# Patient Record
Sex: Female | Born: 1965 | Race: Black or African American | Hispanic: No | Smoking: Never smoker
Health system: Southern US, Community
[De-identification: ages and names within clinical notes are randomized; demographics above are authoritative.]

## PROBLEM LIST (undated history)

## (undated) DIAGNOSIS — G5710 Meralgia paresthetica, unspecified lower limb: Secondary | ICD-10-CM

## (undated) DIAGNOSIS — J189 Pneumonia, unspecified organism: Secondary | ICD-10-CM

## (undated) DIAGNOSIS — D509 Iron deficiency anemia, unspecified: Secondary | ICD-10-CM

## (undated) DIAGNOSIS — C50919 Malignant neoplasm of unspecified site of unspecified female breast: Secondary | ICD-10-CM

## (undated) DIAGNOSIS — Z923 Personal history of irradiation: Secondary | ICD-10-CM

## (undated) DIAGNOSIS — R519 Headache, unspecified: Secondary | ICD-10-CM

## (undated) DIAGNOSIS — K219 Gastro-esophageal reflux disease without esophagitis: Secondary | ICD-10-CM

## (undated) DIAGNOSIS — J309 Allergic rhinitis, unspecified: Secondary | ICD-10-CM

## (undated) DIAGNOSIS — M1990 Unspecified osteoarthritis, unspecified site: Secondary | ICD-10-CM

## (undated) DIAGNOSIS — K449 Diaphragmatic hernia without obstruction or gangrene: Secondary | ICD-10-CM

## (undated) DIAGNOSIS — K222 Esophageal obstruction: Secondary | ICD-10-CM

## (undated) DIAGNOSIS — R51 Headache: Secondary | ICD-10-CM

## (undated) DIAGNOSIS — E890 Postprocedural hypothyroidism: Secondary | ICD-10-CM

## (undated) DIAGNOSIS — R1310 Dysphagia, unspecified: Secondary | ICD-10-CM

## (undated) DIAGNOSIS — E559 Vitamin D deficiency, unspecified: Secondary | ICD-10-CM

## (undated) DIAGNOSIS — T4145XA Adverse effect of unspecified anesthetic, initial encounter: Secondary | ICD-10-CM

## (undated) DIAGNOSIS — F411 Generalized anxiety disorder: Secondary | ICD-10-CM

## (undated) DIAGNOSIS — E669 Obesity, unspecified: Secondary | ICD-10-CM

## (undated) DIAGNOSIS — M778 Other enthesopathies, not elsewhere classified: Secondary | ICD-10-CM

## (undated) DIAGNOSIS — T8859XA Other complications of anesthesia, initial encounter: Secondary | ICD-10-CM

## (undated) DIAGNOSIS — I10 Essential (primary) hypertension: Secondary | ICD-10-CM

## (undated) DIAGNOSIS — E236 Other disorders of pituitary gland: Secondary | ICD-10-CM

## (undated) DIAGNOSIS — E0590 Thyrotoxicosis, unspecified without thyrotoxic crisis or storm: Secondary | ICD-10-CM

## (undated) DIAGNOSIS — Z9221 Personal history of antineoplastic chemotherapy: Secondary | ICD-10-CM

## (undated) DIAGNOSIS — E0500 Thyrotoxicosis with diffuse goiter without thyrotoxic crisis or storm: Secondary | ICD-10-CM

## (undated) DIAGNOSIS — R011 Cardiac murmur, unspecified: Secondary | ICD-10-CM

## (undated) DIAGNOSIS — Z803 Family history of malignant neoplasm of breast: Secondary | ICD-10-CM

## (undated) DIAGNOSIS — G4733 Obstructive sleep apnea (adult) (pediatric): Secondary | ICD-10-CM

## (undated) DIAGNOSIS — F419 Anxiety disorder, unspecified: Secondary | ICD-10-CM

## (undated) DIAGNOSIS — E039 Hypothyroidism, unspecified: Secondary | ICD-10-CM

## (undated) DIAGNOSIS — D649 Anemia, unspecified: Secondary | ICD-10-CM

## (undated) HISTORY — PX: DILATION AND CURETTAGE OF UTERUS: SHX78

## (undated) HISTORY — PX: COLONOSCOPY: SHX174

## (undated) HISTORY — DX: Essential (primary) hypertension: I10

## (undated) HISTORY — DX: Allergic rhinitis, unspecified: J30.9

## (undated) HISTORY — DX: Meralgia paresthetica, unspecified lower limb: G57.10

## (undated) HISTORY — DX: Obesity, unspecified: E66.9

## (undated) HISTORY — DX: Obstructive sleep apnea (adult) (pediatric): G47.33

## (undated) HISTORY — DX: Diaphragmatic hernia without obstruction or gangrene: K44.9

## (undated) HISTORY — PX: ESOPHAGOGASTRODUODENOSCOPY: SHX1529

## (undated) HISTORY — DX: Anxiety disorder, unspecified: F41.9

## (undated) HISTORY — DX: Vitamin D deficiency, unspecified: E55.9

## (undated) HISTORY — DX: Other disorders of pituitary gland: E23.6

## (undated) HISTORY — DX: Generalized anxiety disorder: F41.1

## (undated) HISTORY — DX: Hypothyroidism, unspecified: E03.9

## (undated) HISTORY — PX: KNEE ARTHROSCOPY: SUR90

## (undated) HISTORY — PX: WISDOM TOOTH EXTRACTION: SHX21

## (undated) HISTORY — DX: Gastro-esophageal reflux disease without esophagitis: K21.9

## (undated) HISTORY — DX: Esophageal obstruction: K22.2

## (undated) HISTORY — DX: Family history of malignant neoplasm of breast: Z80.3

## (undated) HISTORY — DX: Dysphagia, unspecified: R13.10

## (undated) HISTORY — DX: Iron deficiency anemia, unspecified: D50.9

## (undated) HISTORY — DX: Postprocedural hypothyroidism: E89.0

## (undated) HISTORY — DX: Thyrotoxicosis with diffuse goiter without thyrotoxic crisis or storm: E05.00

## (undated) HISTORY — DX: Cardiac murmur, unspecified: R01.1

---
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
## 1999-06-07 ENCOUNTER — Encounter: Payer: Self-pay | Admitting: Internal Medicine

## 1999-06-07 ENCOUNTER — Ambulatory Visit (HOSPITAL_COMMUNITY): Admission: RE | Admit: 1999-06-07 | Discharge: 1999-06-07 | Payer: Self-pay | Admitting: Internal Medicine

## 2000-11-30 ENCOUNTER — Other Ambulatory Visit: Admission: RE | Admit: 2000-11-30 | Discharge: 2000-11-30 | Payer: Self-pay | Admitting: *Deleted

## 2001-01-26 ENCOUNTER — Encounter: Admission: RE | Admit: 2001-01-26 | Discharge: 2001-01-26 | Payer: Self-pay | Admitting: Obstetrics and Gynecology

## 2001-01-26 ENCOUNTER — Encounter: Payer: Self-pay | Admitting: Obstetrics and Gynecology

## 2003-08-11 ENCOUNTER — Ambulatory Visit (HOSPITAL_COMMUNITY): Admission: RE | Admit: 2003-08-11 | Discharge: 2003-08-11 | Payer: Self-pay | Admitting: Family Medicine

## 2003-08-19 ENCOUNTER — Ambulatory Visit (HOSPITAL_BASED_OUTPATIENT_CLINIC_OR_DEPARTMENT_OTHER): Admission: RE | Admit: 2003-08-19 | Discharge: 2003-08-19 | Payer: Self-pay | Admitting: Family Medicine

## 2004-05-20 ENCOUNTER — Ambulatory Visit: Payer: Self-pay | Admitting: Internal Medicine

## 2004-09-24 ENCOUNTER — Ambulatory Visit: Payer: Self-pay | Admitting: Internal Medicine

## 2004-10-09 ENCOUNTER — Ambulatory Visit: Payer: Self-pay | Admitting: Family Medicine

## 2005-06-28 ENCOUNTER — Ambulatory Visit: Payer: Self-pay | Admitting: Internal Medicine

## 2006-12-29 ENCOUNTER — Other Ambulatory Visit: Admission: RE | Admit: 2006-12-29 | Discharge: 2006-12-29 | Payer: Self-pay | Admitting: Obstetrics and Gynecology

## 2008-04-02 ENCOUNTER — Encounter: Payer: Self-pay | Admitting: Family Medicine

## 2009-07-31 ENCOUNTER — Encounter: Admission: RE | Admit: 2009-07-31 | Discharge: 2009-07-31 | Payer: Self-pay | Admitting: Family Medicine

## 2010-03-21 ENCOUNTER — Encounter: Payer: Self-pay | Admitting: Family Medicine

## 2010-08-30 ENCOUNTER — Other Ambulatory Visit (HOSPITAL_COMMUNITY): Payer: Self-pay | Admitting: Family Medicine

## 2010-08-30 DIAGNOSIS — E0590 Thyrotoxicosis, unspecified without thyrotoxic crisis or storm: Secondary | ICD-10-CM

## 2010-09-15 ENCOUNTER — Encounter (HOSPITAL_COMMUNITY)
Admission: RE | Admit: 2010-09-15 | Discharge: 2010-09-15 | Disposition: A | Discharge status: Home / Self Care | Payer: BC Managed Care – PPO | Source: Ambulatory Visit | Attending: Family Medicine | Admitting: Family Medicine

## 2010-09-15 DIAGNOSIS — E042 Nontoxic multinodular goiter: Secondary | ICD-10-CM | POA: Insufficient documentation

## 2010-09-15 DIAGNOSIS — E0590 Thyrotoxicosis, unspecified without thyrotoxic crisis or storm: Secondary | ICD-10-CM | POA: Insufficient documentation

## 2010-09-16 ENCOUNTER — Encounter (HOSPITAL_COMMUNITY)
Admission: RE | Admit: 2010-09-16 | Discharge: 2010-09-16 | Disposition: A | Discharge status: Home / Self Care | Payer: BC Managed Care – PPO | Source: Ambulatory Visit | Attending: Family Medicine | Admitting: Family Medicine

## 2010-09-16 ENCOUNTER — Encounter (HOSPITAL_COMMUNITY): Payer: Self-pay

## 2010-09-16 HISTORY — DX: Thyrotoxicosis, unspecified without thyrotoxic crisis or storm: E05.90

## 2010-09-16 MED ORDER — SODIUM IODIDE I 131 CAPSULE
9.7000 | Freq: Once | INTRAVENOUS | Status: AC | PRN
  Administered 2010-09-16: 9.7 via ORAL

## 2010-09-16 MED ORDER — SODIUM PERTECHNETATE TC 99M INJECTION
10.3000 | Freq: Once | INTRAVENOUS | Status: AC | PRN
  Administered 2010-09-16: 10.3 via INTRAVENOUS

## 2011-01-06 ENCOUNTER — Other Ambulatory Visit (HOSPITAL_COMMUNITY): Payer: Self-pay | Admitting: Endocrinology

## 2011-01-06 DIAGNOSIS — E0590 Thyrotoxicosis, unspecified without thyrotoxic crisis or storm: Secondary | ICD-10-CM

## 2011-01-13 ENCOUNTER — Encounter (HOSPITAL_COMMUNITY)
Admission: RE | Admit: 2011-01-13 | Discharge: 2011-01-13 | Disposition: A | Discharge status: Home / Self Care | Payer: BC Managed Care – PPO | Source: Ambulatory Visit | Attending: Endocrinology | Admitting: Endocrinology

## 2011-01-13 DIAGNOSIS — E0500 Thyrotoxicosis with diffuse goiter without thyrotoxic crisis or storm: Secondary | ICD-10-CM

## 2011-01-13 DIAGNOSIS — E0590 Thyrotoxicosis, unspecified without thyrotoxic crisis or storm: Secondary | ICD-10-CM | POA: Insufficient documentation

## 2011-01-13 HISTORY — DX: Thyrotoxicosis with diffuse goiter without thyrotoxic crisis or storm: E05.00

## 2011-01-13 MED ORDER — SODIUM IODIDE I 131 CAPSULE: 14.6000 | Freq: Once | INTRAVENOUS | Status: AC | PRN

## 2012-10-08 ENCOUNTER — Other Ambulatory Visit: Payer: Self-pay

## 2012-10-08 DIAGNOSIS — Z1231 Encounter for screening mammogram for malignant neoplasm of breast: Secondary | ICD-10-CM

## 2012-11-05 ENCOUNTER — Ambulatory Visit
Admission: RE | Admit: 2012-11-05 | Discharge: 2012-11-05 | Disposition: A | Discharge status: Home / Self Care | Payer: BC Managed Care – PPO | Source: Ambulatory Visit

## 2012-11-05 DIAGNOSIS — Z1231 Encounter for screening mammogram for malignant neoplasm of breast: Secondary | ICD-10-CM

## 2014-01-16 ENCOUNTER — Ambulatory Visit (INDEPENDENT_AMBULATORY_CARE_PROVIDER_SITE_OTHER): Payer: BC Managed Care – PPO | Admitting: Cardiology

## 2014-01-16 ENCOUNTER — Other Ambulatory Visit: Payer: Self-pay | Admitting: *Deleted

## 2014-01-16 ENCOUNTER — Encounter: Payer: Self-pay | Admitting: *Deleted

## 2014-01-16 VITALS — BP 132/88 | HR 66 | Ht 65.0 in | Wt 296.0 lb

## 2014-01-16 DIAGNOSIS — Z8241 Family history of sudden cardiac death: Secondary | ICD-10-CM

## 2014-01-16 DIAGNOSIS — R011 Cardiac murmur, unspecified: Secondary | ICD-10-CM

## 2014-01-16 DIAGNOSIS — I10 Essential (primary) hypertension: Secondary | ICD-10-CM

## 2014-01-16 NOTE — Patient Instructions (Signed)
Your physician recommends that you continue on your current medications as directed. Please refer to the Current Medication list given to you today.   Your physician has requested that you have an echocardiogram. Echocardiography is a painless test that uses sound waves to create images of your heart. It provides your doctor with information about the size and shape of your heart and how well your heart's chambers and valves are working. This procedure takes approximately one hour. There are no restrictions for this procedure.    Your physician has requested that you have an exercise tolerance test. For further information please visit [email or web address]. Please also follow instruction sheet, as given.   Your physician recommends that you schedule a follow-up appointment in: [place]

## 2014-01-16 NOTE — Progress Notes (Signed)
Patient ID: Lizzet Hendley, female   DOB: 03/11/1965, 48 y.o.   MRN: [id number]     Patient Name: Loretta Nelson Date of Encounter: 01/16/2014  Primary Care Provider:  No primary care provider on file. Primary Cardiologist: Dorothy Spark   Problem List   Past Medical History  Diagnosis Date  . Hyperthyroidism   . Graves disease 01/13/11    radioactive iodine treatment, 78.2 millicuries  . Hypothyroidism     s/p treatment  . Hypertension   . Obesity   . Migraine headache   . Allergic rhinitis   . Murmur     benign, h/o, caused by hyperdynamic contraction  . Esophageal stricture   . Hiatal hernia     small  . Anxiety   . Vitamin D deficiency    No past surgical history on file.  Allergies  No Known Allergies  HPI  A very pleasant 48 year old female with history of obesity, difficult to control hypertension who is being referred to [place] for management of hypertension. The patient has history of hyperthyroidism treated with radioactive iodine that was followed by 50 pound weight gain in the last 2 years. The patient is under significant amount of stress, her brother aged 44 passed away 5 months ago of sudden cardiac death, he had no prior medical history he passed away after running and was found that in his car. Autopsy was never performed. The patient is a caregiver of 48 year old twins and her sick mom. She is a fifth grade teacher. She denies any chest pain or shortness of breath other than on significant exertion. She hasn't been exercising lately as she is extremely busy. No history of palpitations or syncope. No orthopnea or paroxysmal nocturnal dyspnea.   Home Medications  Prior to Admission medications   Medication Sig Start Date End Date Taking? Authorizing Provider  Cholecalciferol 4000 UNITS CAPS Take 1 capsule by mouth daily.   Yes Historical Provider, MD  fluticasone (FLONASE) 50 MCG/ACT nasal spray Place into both nostrils daily.   Yes Historical  Provider, MD  loratadine (CLARITIN) 10 MG tablet Take 10 mg by mouth daily.   Yes Historical Provider, MD  amLODipine (NORVASC) 5 MG tablet Take 1 tablet by mouth daily. 12/15/13   Historical Provider, MD  citalopram (CELEXA) 20 MG tablet Take 1 tablet by mouth daily. 12/15/13   Historical Provider, MD  FERREX 150 150 MG capsule Take 1 tablet by mouth daily. 01/06/14   Historical Provider, MD  KLOR-CON M20 20 MEQ tablet Take 20 mEq by mouth daily. 01/06/14   Historical Provider, MD  metoprolol succinate (TOPROL-XL) 100 MG 24 hr tablet Take 100 mg by mouth daily. 01/08/14   Historical Provider, MD  pantoprazole (PROTONIX) 40 MG tablet Take 1 tablet by mouth daily. 12/15/13   Historical Provider, MD  SYNTHROID 150 MCG tablet Take 1 tablet by mouth daily. 12/25/13   Historical Provider, MD  valsartan-hydrochlorothiazide (DIOVAN-HCT) 320-25 MG per tablet Take 1 tablet by mouth daily. 01/06/14   Historical Provider, MD    Family History  Family History  Problem Relation Age of Onset  . Hypertension Mother   . Breast cancer Paternal Grandmother   . CAD Maternal Grandfather   . HIV Brother   . Heart attack Brother     sudden MI vs PE  . Breast cancer Sister   . Colon polyps Neg Hx   . Colon cancer Neg Hx   . Liver disease Neg Hx     Social History  History   Social History  . Marital Status: Divorced    Spouse Name: N/A    Number of Children: N/A  . Years of Education: N/A   Occupational History  . Not on file.   Social History Main Topics  . Smoking status: Never Smoker   . Smokeless tobacco: Not on file  . Alcohol Use: No  . Drug Use: No  . Sexual Activity: Not on file   Other Topics Concern  . Not on file   Social History Narrative     Review of Systems, as per HPI, otherwise negative General:  No chills, fever, night sweats or weight changes.  Cardiovascular:  No chest pain, dyspnea on exertion, edema, orthopnea, palpitations, paroxysmal nocturnal  dyspnea. Dermatological: No rash, lesions/masses Respiratory: No cough, dyspnea Urologic: No hematuria, dysuria Abdominal:   No nausea, vomiting, diarrhea, bright red blood per rectum, melena, or hematemesis Neurologic:  No visual changes, wkns, changes in mental status. All other systems reviewed and are otherwise negative except as noted above.  Physical Exam  Blood pressure 132/88, pulse 66, height 5\' 5"  (1.651 m), weight 296 lb (134.265 kg).  General: Pleasant, NAD Psych: Normal affect. Neuro: Alert and oriented X 3. Moves all extremities spontaneously. HEENT: Normal  Neck: Supple without bruits or JVD. Lungs:  Resp regular and unlabored, CTA. Heart: RRR no s3, s4, 2/6 holosystolic murmur.. Abdomen: Soft, non-tender, non-distended, BS + x 4.  Extremities: No clubbing, cyanosis or edema. DP/PT/Radials 2+ and equal bilaterally.  Labs:  No results for input(s): CKTOTAL, CKMB, TROPONINI in the last 72 hours. No results found for: WBC, HGB, HCT, MCV, PLT  No results found for: DDIMER Invalid input(s): POCBNP No results found for: NA, K, CL, CO2, GLUCOSE, BUN, CREATININE, CALCIUM, PROT, ALBUMIN, AST, ALT, ALKPHOS, BILITOT, GFRNONAA, GFRAA No results found for: CHOL, HDL, LDLCALC, TRIG  Accessory Clinical Findings  Echocardiogram - none  ECG - SR, normal ECG    Assessment & Plan  A very pleasant 48 year old female  1. Hypertension - currently controlled at rest on amlodipine 5 mg daily, Diovan HCTZ 320/25 mg daily, and recently increased Toprol-XL to 100 mg daily. We will perform echocardiogram to evaluate for systolic and diastolic dysfunction and degree of LVH. We will also order an exercise treadmill stress test to evaluate for blood pressure response at stress.  2. Systolic murmur, family history of cardiac death - we will order an echocardiogram to evaluate for possible hypertrophic cardiomyopathy  3. Lipids - unknown however she states they were checked by her primary  care physician and were normal.  4. Obesity - if tests normal encouraged exercise.  Follow-up in one month.    Dorothy Spark, MD, [facility] 01/16/2014, 2:59 PM

## 2014-02-18 ENCOUNTER — Ambulatory Visit (HOSPITAL_COMMUNITY): Payer: BC Managed Care – PPO | Attending: Cardiology | Admitting: Radiology

## 2014-02-18 ENCOUNTER — Encounter: Payer: Self-pay | Admitting: Nurse Practitioner

## 2014-02-18 ENCOUNTER — Ambulatory Visit (INDEPENDENT_AMBULATORY_CARE_PROVIDER_SITE_OTHER): Payer: BC Managed Care – PPO | Admitting: Nurse Practitioner

## 2014-02-18 DIAGNOSIS — I10 Essential (primary) hypertension: Secondary | ICD-10-CM

## 2014-02-18 DIAGNOSIS — E669 Obesity, unspecified: Secondary | ICD-10-CM | POA: Diagnosis not present

## 2014-02-18 NOTE — Progress Notes (Signed)
Echocardiogram performed.  

## 2014-02-18 NOTE — Progress Notes (Signed)
Exercise Treadmill Test  Pre-Exercise Testing Evaluation Rhythm: normal sinus  Rate: 64 bpm     Test  Exercise Tolerance Test Ordering MD: Ena Dawley, MD  Interpreting MD: Truitt Merle, NP  Unique Test No: 1  Treadmill:  1  Indication for ETT: assess BP response  Contraindication to ETT: No   Stress Modality: exercise - treadmill  Cardiac Imaging Performed: non   Protocol: standard Bruce - maximal  Max BP:  209/74  Max MPHR (bpm):  172 85% MPR (bpm):  146  MPHR obtained (bpm):  148 % MPHR obtained:  86%  Reached 85% MPHR (min:sec):  6:15 Total Exercise Time (min-sec):  6:30  Workload in METS:  7.7 Borg Scale: 17  Reason ETT Terminated:  patient's desire to stop    ST Segment Analysis At Rest: normal ST segments - no evidence of significant ST depression With Exercise: no evidence of significant ST depression  Other Information Arrhythmia:  No Angina during ETT:  absent (0) Quality of ETT:  diagnostic  ETT Interpretation:  normal - no evidence of ischemia by ST analysis  Comments: Patient presents today for routine GXT. Has had HTN that has been difficult to manage. For echo after today's study. She held ALL of her BP medicines for today's study.  Today the patient exercised on the standard Bruce protocol for a total of 6:30 minutes.  Reduced exercise tolerance.  Adequate blood pressure response. She took NO BP medicines today.  Clinically negative for chest pain. Test was stopped due to achievement of target HR and fatigue.  EKG negative for ischemia. Resting EKG with poor R wave progression. No significant arrhythmia noted.   Recommendations: CV risk factor modification  Proceed with echo  See Dr. Meda Coffee back as planned.  Patient is agreeable to this plan and will call if any problems develop in the interim.   Burtis Junes, RN, [facility] [address] [place], [place]  [address] [phone]

## 2014-03-14 ENCOUNTER — Ambulatory Visit: Payer: BC Managed Care – PPO | Admitting: Cardiology

## 2014-11-10 ENCOUNTER — Other Ambulatory Visit: Payer: Self-pay | Admitting: Family Medicine

## 2014-11-10 ENCOUNTER — Ambulatory Visit
Admission: RE | Admit: 2014-11-10 | Discharge: 2014-11-10 | Disposition: A | Discharge status: Home / Self Care | Payer: BC Managed Care – PPO | Source: Ambulatory Visit | Attending: Family Medicine | Admitting: Family Medicine

## 2014-11-10 DIAGNOSIS — R58 Hemorrhage, not elsewhere classified: Secondary | ICD-10-CM

## 2014-12-08 ENCOUNTER — Other Ambulatory Visit: Payer: Self-pay | Admitting: Orthopedic Surgery

## 2014-12-08 DIAGNOSIS — M25561 Pain in right knee: Secondary | ICD-10-CM

## 2014-12-11 ENCOUNTER — Ambulatory Visit
Admission: RE | Admit: 2014-12-11 | Discharge: 2014-12-11 | Disposition: A | Discharge status: Home / Self Care | Payer: BC Managed Care – PPO | Source: Ambulatory Visit | Attending: Orthopedic Surgery | Admitting: Orthopedic Surgery

## 2014-12-11 DIAGNOSIS — M25561 Pain in right knee: Secondary | ICD-10-CM

## 2015-03-01 DIAGNOSIS — J189 Pneumonia, unspecified organism: Secondary | ICD-10-CM

## 2015-03-01 HISTORY — DX: Pneumonia, unspecified organism: J18.9

## 2016-02-02 ENCOUNTER — Other Ambulatory Visit: Payer: Self-pay | Admitting: Family Medicine

## 2016-02-02 ENCOUNTER — Ambulatory Visit
Admission: RE | Admit: 2016-02-02 | Discharge: 2016-02-02 | Disposition: A | Discharge status: Home / Self Care | Payer: BC Managed Care – PPO | Source: Ambulatory Visit | Attending: Family Medicine | Admitting: Family Medicine

## 2016-02-02 DIAGNOSIS — R059 Cough, unspecified: Secondary | ICD-10-CM

## 2016-02-02 DIAGNOSIS — R05 Cough: Secondary | ICD-10-CM

## 2016-02-02 DIAGNOSIS — R062 Wheezing: Secondary | ICD-10-CM

## 2016-02-29 HISTORY — PX: ABDOMINAL HYSTERECTOMY: SHX81

## 2016-03-07 ENCOUNTER — Ambulatory Visit
Admission: RE | Admit: 2016-03-07 | Discharge: 2016-03-07 | Disposition: A | Discharge status: Home / Self Care | Payer: BC Managed Care – PPO | Source: Ambulatory Visit | Attending: Family Medicine | Admitting: Family Medicine

## 2016-03-07 ENCOUNTER — Other Ambulatory Visit: Payer: Self-pay | Admitting: Family Medicine

## 2016-03-07 DIAGNOSIS — J181 Lobar pneumonia, unspecified organism: Principal | ICD-10-CM

## 2016-03-07 DIAGNOSIS — J189 Pneumonia, unspecified organism: Secondary | ICD-10-CM

## 2016-03-08 ENCOUNTER — Other Ambulatory Visit: Payer: Self-pay | Admitting: Family Medicine

## 2016-03-08 DIAGNOSIS — R9389 Abnormal findings on diagnostic imaging of other specified body structures: Secondary | ICD-10-CM

## 2016-03-10 ENCOUNTER — Ambulatory Visit
Admission: RE | Admit: 2016-03-10 | Discharge: 2016-03-10 | Disposition: A | Discharge status: Home / Self Care | Payer: BC Managed Care – PPO | Source: Ambulatory Visit | Attending: Family Medicine | Admitting: Family Medicine

## 2016-03-10 DIAGNOSIS — R9389 Abnormal findings on diagnostic imaging of other specified body structures: Secondary | ICD-10-CM

## 2016-03-10 MED ORDER — IOPAMIDOL (ISOVUE-300) INJECTION 61%
75.0000 mL | Freq: Once | INTRAVENOUS | Status: AC | PRN
  Administered 2016-03-10: 75 mL via INTRAVENOUS

## 2016-05-02 ENCOUNTER — Encounter: Payer: Self-pay | Admitting: Family Medicine

## 2016-05-12 ENCOUNTER — Other Ambulatory Visit: Payer: Self-pay | Admitting: Gastroenterology

## 2016-05-12 DIAGNOSIS — R1310 Dysphagia, unspecified: Secondary | ICD-10-CM

## 2016-05-27 ENCOUNTER — Other Ambulatory Visit: Payer: BC Managed Care – PPO

## 2016-05-30 ENCOUNTER — Ambulatory Visit
Admission: RE | Admit: 2016-05-30 | Discharge: 2016-05-30 | Disposition: A | Discharge status: Home / Self Care | Payer: BC Managed Care – PPO | Source: Ambulatory Visit | Attending: Gastroenterology | Admitting: Gastroenterology

## 2016-05-30 DIAGNOSIS — R1310 Dysphagia, unspecified: Secondary | ICD-10-CM

## 2016-08-11 ENCOUNTER — Other Ambulatory Visit: Payer: Self-pay | Admitting: Gastroenterology

## 2016-08-30 ENCOUNTER — Other Ambulatory Visit: Payer: Self-pay | Admitting: Family Medicine

## 2016-08-30 DIAGNOSIS — Z1231 Encounter for screening mammogram for malignant neoplasm of breast: Secondary | ICD-10-CM

## 2016-09-02 ENCOUNTER — Other Ambulatory Visit: Payer: Self-pay | Admitting: Gastroenterology

## 2016-09-06 ENCOUNTER — Encounter (HOSPITAL_COMMUNITY): Payer: Self-pay | Admitting: *Deleted

## 2016-09-06 ENCOUNTER — Ambulatory Visit
Admission: RE | Admit: 2016-09-06 | Discharge: 2016-09-06 | Disposition: A | Discharge status: Home / Self Care | Payer: BC Managed Care – PPO | Source: Ambulatory Visit | Attending: Family Medicine | Admitting: Family Medicine

## 2016-09-06 DIAGNOSIS — Z1231 Encounter for screening mammogram for malignant neoplasm of breast: Secondary | ICD-10-CM

## 2016-09-06 NOTE — Progress Notes (Signed)
Spoke with pt for pre-op call. Pt denies cardiac history except for a "benign" heart murmur. Pt states she is not diabetic  EKG - 2015- in EPIC Stress - 02/18/14 - in EPIC Echo - 02/18/14 - in EPIC

## 2016-09-07 ENCOUNTER — Ambulatory Visit (HOSPITAL_COMMUNITY): Payer: BC Managed Care – PPO | Admitting: Anesthesiology

## 2016-09-07 ENCOUNTER — Encounter (HOSPITAL_COMMUNITY): Payer: Self-pay | Admitting: *Deleted

## 2016-09-07 ENCOUNTER — Encounter (HOSPITAL_COMMUNITY)
Admission: RE | Disposition: A | Discharge status: Home / Self Care | Payer: Self-pay | Source: Ambulatory Visit | Attending: Gastroenterology

## 2016-09-07 ENCOUNTER — Ambulatory Visit (HOSPITAL_COMMUNITY)
Admission: RE | Admit: 2016-09-07 | Discharge: 2016-09-07 | Disposition: A | Discharge status: Home / Self Care | Payer: BC Managed Care – PPO | Source: Ambulatory Visit | Attending: Gastroenterology | Admitting: Gastroenterology

## 2016-09-07 DIAGNOSIS — Z6841 Body Mass Index (BMI) 40.0 and over, adult: Secondary | ICD-10-CM | POA: Insufficient documentation

## 2016-09-07 DIAGNOSIS — K219 Gastro-esophageal reflux disease without esophagitis: Secondary | ICD-10-CM | POA: Insufficient documentation

## 2016-09-07 DIAGNOSIS — R1310 Dysphagia, unspecified: Secondary | ICD-10-CM | POA: Diagnosis present

## 2016-09-07 DIAGNOSIS — I10 Essential (primary) hypertension: Secondary | ICD-10-CM | POA: Diagnosis not present

## 2016-09-07 DIAGNOSIS — E039 Hypothyroidism, unspecified: Secondary | ICD-10-CM | POA: Diagnosis not present

## 2016-09-07 DIAGNOSIS — K222 Esophageal obstruction: Secondary | ICD-10-CM | POA: Insufficient documentation

## 2016-09-07 DIAGNOSIS — Z9989 Dependence on other enabling machines and devices: Secondary | ICD-10-CM | POA: Diagnosis not present

## 2016-09-07 HISTORY — DX: Unspecified osteoarthritis, unspecified site: M19.90

## 2016-09-07 HISTORY — DX: Gastro-esophageal reflux disease without esophagitis: K21.9

## 2016-09-07 HISTORY — DX: Other complications of anesthesia, initial encounter: T88.59XA

## 2016-09-07 HISTORY — DX: Headache: R51

## 2016-09-07 HISTORY — DX: Headache, unspecified: R51.9

## 2016-09-07 HISTORY — DX: Adverse effect of unspecified anesthetic, initial encounter: T41.45XA

## 2016-09-07 HISTORY — DX: Anemia, unspecified: D64.9

## 2016-09-07 HISTORY — PX: ESOPHAGOGASTRODUODENOSCOPY (EGD) WITH PROPOFOL: SHX5813

## 2016-09-07 HISTORY — PX: BALLOON DILATION: SHX5330

## 2016-09-07 SURGERY — ESOPHAGOGASTRODUODENOSCOPY (EGD) WITH PROPOFOL
Anesthesia: Monitor Anesthesia Care

## 2016-09-07 MED ORDER — LACTATED RINGERS IV SOLN
INTRAVENOUS | Status: AC | PRN
  Administered 2016-09-07: 1000 mL via INTRAVENOUS

## 2016-09-07 MED ORDER — SODIUM CHLORIDE 0.9 % IV SOLN: INTRAVENOUS | Status: DC

## 2016-09-07 MED ORDER — PROPOFOL 10 MG/ML IV BOLUS
INTRAVENOUS | Status: DC | PRN
  Administered 2016-09-07 (×3): 20 mg via INTRAVENOUS

## 2016-09-07 MED ORDER — BUTAMBEN-TETRACAINE-BENZOCAINE 2-2-14 % EX AERO
INHALATION_SPRAY | CUTANEOUS | Status: DC | PRN
  Administered 2016-09-07: 2 via TOPICAL

## 2016-09-07 MED ORDER — LACTATED RINGERS IV SOLN
INTRAVENOUS | Status: DC
  Administered 2016-09-07: 09:00:00 via INTRAVENOUS

## 2016-09-07 MED ORDER — PROPOFOL 500 MG/50ML IV EMUL
INTRAVENOUS | Status: DC | PRN
  Administered 2016-09-07: 10:00:00 via INTRAVENOUS
  Administered 2016-09-07: 100 ug/kg/min via INTRAVENOUS

## 2016-09-07 SURGICAL SUPPLY — 14 items

## 2016-09-07 NOTE — Transfer of Care (Signed)
Immediate Anesthesia Transfer of Care Note  Patient: Loretta Nelson  Procedure(s) Performed: Procedure(s): ESOPHAGOGASTRODUODENOSCOPY (EGD) WITH PROPOFOL (N/A) BALLOON DILATION (N/A)  Patient Location: Endoscopy Unit  Anesthesia Type:MAC  Level of Consciousness: awake, oriented and patient cooperative  Airway & Oxygen Therapy: Patient Spontanous Breathing and Patient connected to nasal cannula oxygen  Post-op Assessment: Report given to RN and Post -op Vital signs reviewed and stable  Post vital signs: Reviewed  Last Vitals:  Vitals:   09/07/16 0844  BP: (!) 161/79  Pulse: 64  Resp: 16  Temp: 36.8 C    Last Pain:  Vitals:   09/07/16 0844  TempSrc: Oral         Complications: No apparent anesthesia complications

## 2016-09-07 NOTE — H&P (Signed)
Patient interval history reviewed.  Patient examined again.  There has been no change from documented H/P dated 09/02/16 (scanned into chart from our office) except as documented above.  Assessment:  1.  Dysphagia. 2.  History esophageal stricture.  Plan:  1.  Endoscopy with possible esophageal dilatation. 2.  Risks (bleeding, infection, bowel perforation that could require surgery, sedation-related changes in cardiopulmonary systems), benefits (identification and possible treatment of source of symptoms, exclusion of certain causes of symptoms), and alternatives (watchful waiting, radiographic imaging studies, empiric medical treatment) of upper endoscopy with possible esophageal dilatation (EGD +/- DIL) were explained to patient/family in detail and patient wishes to proceed.

## 2016-09-07 NOTE — Anesthesia Preprocedure Evaluation (Addendum)
Anesthesia Evaluation  Patient identified by MRN, date of birth, ID band Patient awake    Reviewed: Allergy & Precautions, NPO status , Patient's Chart, lab work & pertinent test results  History of Anesthesia Complications (+) history of anesthetic complications  Airway Mallampati: II  TM Distance: >3 FB Neck ROM: Full    Dental  (+) Teeth Intact, Dental Advisory Given   Pulmonary neg pulmonary ROS,    breath sounds clear to auscultation       Cardiovascular hypertension, Pt. on medications  Rhythm:Regular     Neuro/Psych  Headaches, Anxiety  Neuromuscular disease    GI/Hepatic Neg liver ROS, hiatal hernia, GERD  Medicated,  Endo/Other  Hypothyroidism Morbid obesity  Renal/GU negative Renal ROS     Musculoskeletal  (+) Arthritis ,   Abdominal   Peds  Hematology  (+) anemia ,   Anesthesia Other Findings   Reproductive/Obstetrics                            Anesthesia Physical Anesthesia Plan  ASA: II  Anesthesia Plan: MAC   Post-op Pain Management:    Induction: Intravenous  PONV Risk Score and Plan: 2 and Treatment may vary due to age or medical condition  Airway Management Planned: Nasal Cannula, Natural Airway and Simple Face Mask  Additional Equipment: None  Intra-op Plan:   Post-operative Plan:   Informed Consent: I have reviewed the patients History and Physical, chart, labs and discussed the procedure including the risks, benefits and alternatives for the proposed anesthesia with the patient or authorized representative who has indicated his/her understanding and acceptance.   Dental advisory given  Plan Discussed with: Surgeon and CRNA  Anesthesia Plan Comments:         Anesthesia Quick Evaluation

## 2016-09-07 NOTE — Discharge Instructions (Signed)

## 2016-09-07 NOTE — Anesthesia Procedure Notes (Signed)
Procedure Name: MAC Date/Time: 09/07/2016 9:50 AM Performed by: Jenne Campus Pre-anesthesia Checklist: Patient identified, Emergency Drugs available, Suction available, Patient being monitored and Timeout performed Oxygen Delivery Method: Nasal cannula

## 2016-09-07 NOTE — Op Note (Signed)
[facility] Patient Name: Loretta Nelson Procedure Date : 09/07/2016 MRN: [id number] Attending MD: Arta Silence , MD Date of Birth: 06-08-1965 CSN: [id number] Age: 51 Admit Type: Outpatient Procedure:                Upper GI endoscopy Indications:              Dysphagia, Abnormal cine-esophagram Providers:                Arta Silence, MD, Burtis Junes, RN, Alan Mulder,                            Technician Referring MD:              Medicines:                Monitored Anesthesia Care Complications:            No immediate complications. Estimated Blood Loss:     Estimated blood loss: none. Procedure:                Pre-Anesthesia Assessment:                           - Prior to the procedure, a History and Physical                            was performed, and patient medications and                            allergies were reviewed. The patient's tolerance of                            previous anesthesia was also reviewed. The risks                            and benefits of the procedure and the sedation                            options and risks were discussed with the patient.                            All questions were answered, and informed consent                            was obtained. Prior Anticoagulants: The patient has                            taken no previous anticoagulant or antiplatelet                            agents. ASA Grade Assessment: II - A patient with                            mild systemic disease. After reviewing the risks  and benefits, the patient was deemed in                            satisfactory condition to undergo the procedure.                           After obtaining informed consent, the endoscope was                            passed under direct vision. Throughout the                            procedure, the patient's blood pressure, pulse, and                            oxygen  saturations were monitored continuously. The                            [id number] ([id number]) scope was introduced through the                            mouth, and advanced to the second part of duodenum.                            The upper GI endoscopy was accomplished without                            difficulty. The patient tolerated the procedure                            well. Scope In: Scope Out: Findings:      One mild benign-appearing, intrinsic stenosis was found at GE junction.       A TTS dilator was passed through the scope. Dilation with a 15-16.5-18       mm balloon dilator was performed to 16.5 mm. The dilation site was       examined and showed mild improvement in luminal narrowing. Estimated       blood loss: none. No proximal esophageal stricture was seen. No mucosal       features of eosinophilic esophagitis were seen.      The exam of the esophagus was otherwise normal.      The entire examined stomach was normal.      The duodenal bulb, first portion of the duodenum and second portion of       the duodenum were normal. Impression:               - Benign-appearing esophageal stenosis. Dilated.                           - Normal stomach.                           - Normal duodenal bulb, first portion of the  duodenum and second portion of the duodenum.                           - No specimens collected. Moderate Sedation:      None Recommendation:           - Patient has a contact number available for                            emergencies. The signs and symptoms of potential                            delayed complications were discussed with the                            patient. Return to normal activities tomorrow.                            Written discharge instructions were provided to the                            patient.                           - Discharge patient to home (ambulatory).                           - Mechanical  soft diet today.                           - Continue present medications.                           - Return to GI clinic in 6 weeks.                           - Return to referring physician as previously                            scheduled. Procedure Code(s):        --- Professional ---                           [phone], Esophagogastroduodenoscopy, flexible,                            transoral; with transendoscopic balloon dilation of                            esophagus (less than 30 mm diameter) Diagnosis Code(s):        --- Professional ---                           K22.2, Esophageal obstruction                           R13.10, Dysphagia, unspecified  R93.3, Abnormal findings on diagnostic imaging of                            other parts of digestive tract CPT copyright 2016 [employer]. All rights reserved. The codes documented in this report are preliminary and upon coder review may  be revised to meet current compliance requirements. Arta Silence, MD 09/07/2016 10:23:24 AM This report has been signed electronically. Number of Addenda: 0

## 2016-09-09 NOTE — Anesthesia Postprocedure Evaluation (Signed)
Anesthesia Post Note  Patient: Consulting civil engineer  Procedure(s) Performed: Procedure(s) (LRB): ESOPHAGOGASTRODUODENOSCOPY (EGD) WITH PROPOFOL (N/A) BALLOON DILATION (N/A)     Patient location during evaluation: Endoscopy Anesthesia Type: MAC Level of consciousness: awake and alert Pain management: pain level controlled Vital Signs Assessment: post-procedure vital signs reviewed and stable Respiratory status: spontaneous breathing, nonlabored ventilation, respiratory function stable and patient connected to nasal cannula oxygen Cardiovascular status: stable and blood pressure returned to baseline Anesthetic complications: no    Last Vitals:  Vitals:   09/07/16 1020 09/07/16 1035  BP: (!) 188/101 (!) 146/78  Pulse:  64  Resp: (!) 21 20  Temp: 37 C     Last Pain:  Vitals:   09/07/16 1020  TempSrc: Oral                 Mylz Yuan

## 2016-10-10 ENCOUNTER — Ambulatory Visit
Admission: RE | Admit: 2016-10-10 | Discharge: 2016-10-10 | Disposition: A | Discharge status: Home / Self Care | Payer: BC Managed Care – PPO | Source: Ambulatory Visit | Attending: Family Medicine | Admitting: Family Medicine

## 2016-10-10 ENCOUNTER — Other Ambulatory Visit: Payer: Self-pay | Admitting: Family Medicine

## 2016-10-10 DIAGNOSIS — R221 Localized swelling, mass and lump, neck: Secondary | ICD-10-CM

## 2016-11-01 ENCOUNTER — Other Ambulatory Visit: Payer: Self-pay | Admitting: Obstetrics and Gynecology

## 2016-11-01 ENCOUNTER — Other Ambulatory Visit (HOSPITAL_COMMUNITY)
Admission: RE | Admit: 2016-11-01 | Discharge: 2016-11-01 | Disposition: A | Discharge status: Home / Self Care | Payer: BC Managed Care – PPO | Source: Ambulatory Visit | Attending: Obstetrics and Gynecology | Admitting: Obstetrics and Gynecology

## 2016-11-01 DIAGNOSIS — Z124 Encounter for screening for malignant neoplasm of cervix: Secondary | ICD-10-CM | POA: Diagnosis not present

## 2016-11-04 LAB — CYTOLOGY - PAP
DIAGNOSIS: NEGATIVE
HPV: NOT DETECTED

## 2017-02-02 NOTE — Patient Instructions (Addendum)
Your procedure is scheduled on:  Friday, Dec. 21, 2018  Enter through the [employer] of [facility] at:  7:15 AM  Pick up the phone at the desk and dial [phone].  Call this number if you have problems the morning of surgery: [phone].  Remember: Do NOT eat food or drink after:  Midnight Thursday  Take these medicines the morning of surgery with a SIP OF WATER:  Amlodipine, Citalopram, Levothyroxine, Metoprolol, Omeprazole  Stop ALL herbal medications at this time  Do NOT smoke the day of surgery.  Do NOT wear jewelry (body piercing), metal hair clips/bobby pins, make-up, artifical eyelashes or nail polish. Do NOT wear lotions, powders, or perfumes.  You may wear deodorant. Do NOT shave for 48 hours prior to surgery. Do NOT bring valuables to the hospital. Contacts, dentures, or bridgework may not be worn into surgery.  Leave suitcase in car.  After surgery it may be brought to your room.  For patients admitted to the hospital, checkout time is 11:00 AM the day of discharge.  Bring a copy of your healthcare power of attorney and living will documents.  [facility] - Preparing for Surgery Before surgery, you can play an important role.  Because skin is not sterile, your skin needs to be as free of germs as possible.  You can reduce the number of germs on your skin by washing with CHG (chlorahexidine gluconate) soap before surgery.  CHG is an antiseptic cleaner which kills germs and bonds with the skin to continue killing germs even after washing. Please DO NOT use if you have an allergy to CHG or antibacterial soaps.  If your skin becomes reddened/irritated stop using the CHG and inform your nurse when you arrive at Short Stay. Do not shave (including legs and underarms) for at least 48 hours prior to the first CHG shower.  You may shave your face/neck.  Please follow these instructions carefully:  1.  Shower with CHG Soap the night before surgery and the  morning of  surgery.  2.  If you choose to wash your hair, wash your hair first as usual with your normal  shampoo.  3.  After you shampoo, rinse your hair and body thoroughly to remove the shampoo.                             4.  Use CHG as you would any other liquid soap.  You can apply chg directly to the skin and wash.  Gently with a scrungie or clean washcloth.  5.  Apply the CHG Soap to your body ONLY FROM THE NECK DOWN.   Do   not use on face/ open                           Wound or open sores. Avoid contact with eyes, ears mouth and   genitals (private parts).                       Wash face,  Genitals (private parts) with your normal soap.             6.  Wash thoroughly, paying special attention to the area where your    surgery  will be performed.  7.  Thoroughly rinse your body with warm water from the neck down.  8.  DO NOT shower/wash with your normal soap after  using and rinsing off the CHG Soap.                9.  Pat yourself dry with a clean towel.            10.  Wear clean pajamas.            11.  Place clean sheets on your bed the night of your first shower and do not  sleep with pets. Day of Surgery : Do not apply any lotions/deodorants the morning of surgery.  Please wear clean clothes to the hospital/surgery center.  FAILURE TO FOLLOW THESE INSTRUCTIONS MAY RESULT IN THE CANCELLATION OF YOUR SURGERY  PATIENT SIGNATURE_________________________________  NURSE SIGNATURE__________________________________  ________________________________________________________________________   Loretta Nelson  An incentive spirometer is a tool that can help keep your lungs clear and active. This tool measures how well you are filling your lungs with each breath. Taking long deep breaths may help reverse or decrease the chance of developing breathing (pulmonary) problems (especially infection) following:  A long period of time when you are unable to move or be active. BEFORE THE PROCEDURE    If the spirometer includes an indicator to show your best effort, your nurse or respiratory therapist will set it to a desired goal.  If possible, sit up straight or lean slightly forward. Try not to slouch.  Hold the incentive spirometer in an upright position. INSTRUCTIONS FOR USE  1. Sit on the edge of your bed if possible, or sit up as far as you can in bed or on a chair. 2. Hold the incentive spirometer in an upright position. 3. Breathe out normally. 4. Place the mouthpiece in your mouth and seal your lips tightly around it. 5. Breathe in slowly and as deeply as possible, raising the piston or the ball toward the top of the column. 6. Hold your breath for 3-5 seconds or for as long as possible. Allow the piston or ball to fall to the bottom of the column. 7. Remove the mouthpiece from your mouth and breathe out normally. 8. Rest for a few seconds and repeat Steps 1 through 7 at least 10 times every 1-2 hours when you are awake. Take your time and take a few normal breaths between deep breaths. 9. The spirometer may include an indicator to show your best effort. Use the indicator as a goal to work toward during each repetition. 10. After each set of 10 deep breaths, practice coughing to be sure your lungs are clear. If you have an incision (the cut made at the time of surgery), support your incision when coughing by placing a pillow or rolled up towels firmly against it. Once you are able to get out of bed, walk around indoors and cough well. You may stop using the incentive spirometer when instructed by your caregiver.  RISKS AND COMPLICATIONS  Take your time so you do not get dizzy or light-headed.  If you are in pain, you may need to take or ask for pain medication before doing incentive spirometry. It is harder to take a deep breath if you are having pain. AFTER USE  Rest and breathe slowly and easily.  It can be helpful to keep track of a log of your progress. Your caregiver  can provide you with a simple table to help with this. If you are using the spirometer at home, follow these instructions: [place] IF:   You are having difficultly using the spirometer.  You have  trouble using the spirometer as often as instructed.  Your pain medication is not giving enough relief while using the spirometer.  You develop fever of 100.5 F (38.1 C) or higher. SEEK IMMEDIATE MEDICAL CARE IF:   You cough up bloody sputum that had not been present before.  You develop fever of 102 F (38.9 C) or greater.  You develop worsening pain at or near the incision site. MAKE SURE YOU:   Understand these instructions.  Will watch your condition.  Will get help right away if you are not doing well or get worse. Document Released: 06/27/2006 Document Revised: 05/09/2011 Document Reviewed: 08/28/2006 Exit[employer] Patient Information 2014 [employer], [place].   ________________________________________________________________________  WHAT IS A BLOOD TRANSFUSION? Blood Transfusion Information  A transfusion is the replacement of blood or some of its parts. Blood is made up of multiple cells which provide different functions.  Red blood cells carry oxygen and are used for blood loss replacement.  White blood cells fight against infection.  Platelets control bleeding.  Plasma helps clot blood.  Other blood products are available for specialized needs, such as hemophilia or other clotting disorders. BEFORE THE TRANSFUSION  Who gives blood for transfusions?   Healthy volunteers who are fully evaluated to make sure their blood is safe. This is blood bank blood. Transfusion therapy is the safest it has ever been in the practice of medicine. Before blood is taken from a donor, a complete history is taken to make sure that person has no history of diseases nor engages in risky social behavior (examples are intravenous drug use or sexual activity with multiple partners). The  donor's travel history is screened to minimize risk of transmitting infections, such as malaria. The donated blood is tested for signs of infectious diseases, such as HIV and hepatitis. The blood is then tested to be sure it is compatible with you in order to minimize the chance of a transfusion reaction. If you or a relative donates blood, this is often done in anticipation of surgery and is not appropriate for emergency situations. It takes many days to process the donated blood. RISKS AND COMPLICATIONS Although transfusion therapy is very safe and saves many lives, the main dangers of transfusion include:   Getting an infectious disease.  Developing a transfusion reaction. This is an allergic reaction to something in the blood you were given. Every precaution is taken to prevent this. The decision to have a blood transfusion has been considered carefully by your caregiver before blood is given. Blood is not given unless the benefits outweigh the risks. AFTER THE TRANSFUSION  Right after receiving a blood transfusion, you will usually feel much better and more energetic. This is especially true if your red blood cells have gotten low (anemic). The transfusion raises the level of the red blood cells which carry oxygen, and this usually causes an energy increase.  The nurse administering the transfusion will monitor you carefully for complications. HOME CARE INSTRUCTIONS  No special instructions are needed after a transfusion. You may find your energy is better. Speak with your caregiver about any limitations on activity for underlying diseases you may have. SEEK MEDICAL CARE IF:   Your condition is not improving after your transfusion.  You develop redness or irritation at the intravenous (IV) site. SEEK IMMEDIATE MEDICAL CARE IF:  Any of the following symptoms occur over the next 12 hours:  Shaking chills.  You have a temperature by mouth above 102 F (38.9 C), not controlled by  medicine.  Chest, back, or muscle pain.  People around you feel you are not acting correctly or are confused.  Shortness of breath or difficulty breathing.  Dizziness and fainting.  You get a rash or develop hives.  You have a decrease in urine output.  Your urine turns a dark color or changes to pink, red, or brown. Any of the following symptoms occur over the next 10 days:  You have a temperature by mouth above 102 F (38.9 C), not controlled by medicine.  Shortness of breath.  Weakness after normal activity.  The white part of the eye turns yellow (jaundice).  You have a decrease in the amount of urine or are urinating less often.  Your urine turns a dark color or changes to pink, red, or brown. Document Released: 02/12/2000 Document Revised: 05/09/2011 Document Reviewed: 10/01/2007 [facility] Patient Information 2014 [place], [place].  _______________________________________________________________________

## 2017-02-07 ENCOUNTER — Other Ambulatory Visit: Payer: Self-pay | Admitting: Obstetrics and Gynecology

## 2017-02-08 ENCOUNTER — Inpatient Hospital Stay (HOSPITAL_COMMUNITY)
Admission: RE | Admit: 2017-02-08 | Discharge: 2017-02-08 | Disposition: A | Discharge status: Home / Self Care | Payer: BC Managed Care – PPO | Source: Ambulatory Visit

## 2017-02-08 NOTE — Patient Instructions (Addendum)
Your procedure is scheduled on: Friday February 17, 2017 at 8:45 am  Enter through the Main Entrance of [facility] at: 7:15 am  Pick up the phone at the desk and dial [phone].  Call this number if you have problems the morning of surgery: [phone].  Remember: Do NOT eat food or drink any liquids after: after Midnight on Thursday December 20   Take these medicines the morning of surgery with a SIP OF WATER: Amlodipine, Metoprolol, synthroid,omeprazole   Do NOT wear jewelry (body piercing), metal hair clips/bobby pins, make-up, or nail polish. Do NOT wear lotions, powders, or perfumes.  You may wear deoderant. Do NOT shave for 48 hours prior to surgery. Do NOT bring valuables to the hospital. Contacts, dentures, or bridgework may not be worn into surgery. Leave suitcase in car.  After surgery it may be brought to your room.  For patients admitted to the hospital, checkout time is 11:00 AM the day of discharge.

## 2017-02-08 NOTE — Patient Instructions (Addendum)
Your procedure is scheduled on:  Friday, Dec. 21, 2018  Enter through the [employer] of [facility] at:  7:15 AM  Pick up the phone at the desk and dial [phone].  Call this number if you have problems the morning of surgery: [phone].  Remember: Do NOT eat food or drink after:  Midnight Thursday  Take these medicines the morning of surgery with a SIP OF WATER:  Amlodipine, Citalopram, Levothyroxine, Metoprolol, Omeprazole  Stop ALL herbal medications at this time  Do NOT smoke the day of surgery.  Do NOT wear jewelry (body piercing), metal hair clips/bobby pins, make-up, artifical eyelashes or nail polish. Do NOT wear lotions, powders, or perfumes.  You may wear deodorant. Do NOT shave for 48 hours prior to surgery. Do NOT bring valuables to the hospital. Contacts, dentures, or bridgework may not be worn into surgery.  Leave suitcase in car.  After surgery it may be brought to your room.  For patients admitted to the hospital, checkout time is 11:00 AM the day of discharge.  Bring a copy of your healthcare power of attorney and living will documents.  [place] - Preparing for Surgery Before surgery, you can play an important role.  Because skin is not sterile, your skin needs to be as free of germs as possible.  You can reduce the number of germs on your skin by washing with CHG (chlorahexidine gluconate) soap before surgery.  CHG is an antiseptic cleaner which kills germs and bonds with the skin to continue killing germs even after washing. Please DO NOT use if you have an allergy to CHG or antibacterial soaps.  If your skin becomes reddened/irritated stop using the CHG and inform your nurse when you arrive at Short Stay. Do not shave (including legs and underarms) for at least 48 hours prior to the first CHG shower.  You may shave your face/neck.  Please follow these instructions carefully:  1.  Shower with CHG Soap the night before surgery and the  morning of  surgery.  2.  If you choose to wash your hair, wash your hair first as usual with your normal  shampoo.  3.  After you shampoo, rinse your hair and body thoroughly to remove the shampoo.                             4.  Use CHG as you would any other liquid soap.  You can apply chg directly to the skin and wash.  Gently with a scrungie or clean washcloth.  5.  Apply the CHG Soap to your body ONLY FROM THE NECK DOWN.   Do not use on face/ open                           Wound or open sores. Avoid contact with eyes, ears mouth and genitals (private parts).                       Wash face,  Genitals (private parts) with your normal soap.             6.  Wash thoroughly, paying special attention to the area where your surgery  will be performed.  7.  Thoroughly rinse your body with warm water from the neck down.  8.  DO NOT shower/wash with your normal soap after using and rinsing off the CHG Soap.  9.  Pat yourself dry with a clean towel.            10.  Wear clean pajamas.            11.  Place clean sheets on your bed the night of your first shower and do not  sleep with pets. Day of Surgery : Do not apply any lotions/deodorants the morning of surgery.  Please wear clean clothes to the hospital/surgery center.  FAILURE TO FOLLOW THESE INSTRUCTIONS MAY RESULT IN THE CANCELLATION OF YOUR SURGERY  PATIENT SIGNATURE_________________________________  NURSE SIGNATURE__________________________________  ________________________________________________________________________   Loretta Nelson  An incentive spirometer is a tool that can help keep your lungs clear and active. This tool measures how well you are filling your lungs with each breath. Taking long deep breaths may help reverse or decrease the chance of developing breathing (pulmonary) problems (especially infection) following:  A long period of time when you are unable to move or be active. BEFORE THE PROCEDURE   If  the spirometer includes an indicator to show your best effort, your nurse or respiratory therapist will set it to a desired goal.  If possible, sit up straight or lean slightly forward. Try not to slouch.  Hold the incentive spirometer in an upright position. INSTRUCTIONS FOR USE  1. Sit on the edge of your bed if possible, or sit up as far as you can in bed or on a chair. 2. Hold the incentive spirometer in an upright position. 3. Breathe out normally. 4. Place the mouthpiece in your mouth and seal your lips tightly around it. 5. Breathe in slowly and as deeply as possible, raising the piston or the ball toward the top of the column. 6. Hold your breath for 3-5 seconds or for as long as possible. Allow the piston or ball to fall to the bottom of the column. 7. Remove the mouthpiece from your mouth and breathe out normally. 8. Rest for a few seconds and repeat Steps 1 through 7 at least 10 times every 1-2 hours when you are awake. Take your time and take a few normal breaths between deep breaths. 9. The spirometer may include an indicator to show your best effort. Use the indicator as a goal to work toward during each repetition. 10. After each set of 10 deep breaths, practice coughing to be sure your lungs are clear. If you have an incision (the cut made at the time of surgery), support your incision when coughing by placing a pillow or rolled up towels firmly against it. Once you are able to get out of bed, walk around indoors and cough well. You may stop using the incentive spirometer when instructed by your caregiver.  RISKS AND COMPLICATIONS  Take your time so you do not get dizzy or light-headed.  If you are in pain, you may need to take or ask for pain medication before doing incentive spirometry. It is harder to take a deep breath if you are having pain. AFTER USE  Rest and breathe slowly and easily.  It can be helpful to keep track of a log of your progress. Your caregiver can  provide you with a simple table to help with this. If you are using the spirometer at home, follow these instructions: [place] IF:   You are having difficultly using the spirometer.  You have trouble using the spirometer as often as instructed.  Your pain medication is not giving enough relief while using the spirometer.  You develop fever of 100.5 F (38.1 C) or higher. SEEK IMMEDIATE MEDICAL CARE IF:   You cough up bloody sputum that had not been present before.  You develop fever of 102 F (38.9 C) or greater.  You develop worsening pain at or near the incision site. MAKE SURE YOU:   Understand these instructions.  Will watch your condition.  Will get help right away if you are not doing well or get worse. Document Released: 06/27/2006 Document Revised: 05/09/2011 Document Reviewed: 08/28/2006 [employer] Patient Information 2014 [employer], [place].   ________________________________________________________________________  WHAT IS A BLOOD TRANSFUSION? Blood Transfusion Information  A transfusion is the replacement of blood or some of its parts. Blood is made up of multiple cells which provide different functions.  Red blood cells carry oxygen and are used for blood loss replacement.  White blood cells fight against infection.  Platelets control bleeding.  Plasma helps clot blood.  Other blood products are available for specialized needs, such as hemophilia or other clotting disorders. BEFORE THE TRANSFUSION  Who gives blood for transfusions?   Healthy volunteers who are fully evaluated to make sure their blood is safe. This is blood bank blood. Transfusion therapy is the safest it has ever been in the practice of medicine. Before blood is taken from a donor, a complete history is taken to make sure that person has no history of diseases nor engages in risky social behavior (examples are intravenous drug use or sexual activity with multiple partners). The  donor's travel history is screened to minimize risk of transmitting infections, such as malaria. The donated blood is tested for signs of infectious diseases, such as HIV and hepatitis. The blood is then tested to be sure it is compatible with you in order to minimize the chance of a transfusion reaction. If you or a relative donates blood, this is often done in anticipation of surgery and is not appropriate for emergency situations. It takes many days to process the donated blood. RISKS AND COMPLICATIONS Although transfusion therapy is very safe and saves many lives, the main dangers of transfusion include:   Getting an infectious disease.  Developing a transfusion reaction. This is an allergic reaction to something in the blood you were given. Every precaution is taken to prevent this. The decision to have a blood transfusion has been considered carefully by your caregiver before blood is given. Blood is not given unless the benefits outweigh the risks. AFTER THE TRANSFUSION  Right after receiving a blood transfusion, you will usually feel much better and more energetic. This is especially true if your red blood cells have gotten low (anemic). The transfusion raises the level of the red blood cells which carry oxygen, and this usually causes an energy increase.  The nurse administering the transfusion will monitor you carefully for complications. HOME CARE INSTRUCTIONS  No special instructions are needed after a transfusion. You may find your energy is better. Speak with your caregiver about any limitations on activity for underlying diseases you may have. SEEK MEDICAL CARE IF:   Your condition is not improving after your transfusion.  You develop redness or irritation at the intravenous (IV) site. SEEK IMMEDIATE MEDICAL CARE IF:  Any of the following symptoms occur over the next 12 hours:  Shaking chills.  You have a temperature by mouth above 102 F (38.9 C), not controlled by  medicine.  Chest, back, or muscle pain.  People around you feel you are not acting correctly or are confused.  Shortness  of breath or difficulty breathing.  Dizziness and fainting.  You get a rash or develop hives.  You have a decrease in urine output.  Your urine turns a dark color or changes to pink, red, or brown. Any of the following symptoms occur over the next 10 days:  You have a temperature by mouth above 102 F (38.9 C), not controlled by medicine.  Shortness of breath.  Weakness after normal activity.  The white part of the eye turns yellow (jaundice).  You have a decrease in the amount of urine or are urinating less often.  Your urine turns a dark color or changes to pink, red, or brown. Document Released: 02/12/2000 Document Revised: 05/09/2011 Document Reviewed: 10/01/2007 [facility] Patient Information 2014 [place], [place].  _______________________________________________________________________

## 2017-02-09 ENCOUNTER — Inpatient Hospital Stay (HOSPITAL_COMMUNITY)
Admission: RE | Admit: 2017-02-09 | Discharge: 2017-02-09 | Disposition: A | Discharge status: Home / Self Care | Payer: BC Managed Care – PPO | Source: Ambulatory Visit

## 2017-02-10 ENCOUNTER — Encounter (HOSPITAL_COMMUNITY)
Admission: RE | Admit: 2017-02-10 | Discharge: 2017-02-10 | Disposition: A | Discharge status: Home / Self Care | Payer: BC Managed Care – PPO | Source: Ambulatory Visit | Attending: Obstetrics and Gynecology | Admitting: Obstetrics and Gynecology

## 2017-02-10 ENCOUNTER — Encounter (HOSPITAL_COMMUNITY): Payer: Self-pay

## 2017-02-10 ENCOUNTER — Other Ambulatory Visit: Payer: Self-pay

## 2017-02-10 DIAGNOSIS — D259 Leiomyoma of uterus, unspecified: Secondary | ICD-10-CM | POA: Insufficient documentation

## 2017-02-10 DIAGNOSIS — Z01812 Encounter for preprocedural laboratory examination: Secondary | ICD-10-CM | POA: Insufficient documentation

## 2017-02-10 DIAGNOSIS — Z01810 Encounter for preprocedural cardiovascular examination: Secondary | ICD-10-CM | POA: Insufficient documentation

## 2017-02-10 DIAGNOSIS — N920 Excessive and frequent menstruation with regular cycle: Secondary | ICD-10-CM | POA: Insufficient documentation

## 2017-02-10 HISTORY — DX: Pneumonia, unspecified organism: J18.9

## 2017-02-10 LAB — CBC
HEMATOCRIT: 37.8 % (ref 36.0–46.0)
Hemoglobin: 11.8 g/dL — ABNORMAL LOW (ref 12.0–15.0)
MCH: 26.3 pg (ref 26.0–34.0)
MCHC: 31.2 g/dL (ref 30.0–36.0)
MCV: 84.4 fL (ref 78.0–100.0)
Platelets: 210 10*3/uL (ref 150–400)
RBC: 4.48 MIL/uL (ref 3.87–5.11)
RDW: 14.2 % (ref 11.5–15.5)
WBC: 6.2 10*3/uL (ref 4.0–10.5)

## 2017-02-10 LAB — TYPE AND SCREEN
ABO/RH(D): O POS
Antibody Screen: NEGATIVE

## 2017-02-10 LAB — BASIC METABOLIC PANEL
ANION GAP: 8 (ref 5–15)
BUN: 14 mg/dL (ref 6–20)
CALCIUM: 9.4 mg/dL (ref 8.9–10.3)
CO2: 28 mmol/L (ref 22–32)
Chloride: 101 mmol/L (ref 101–111)
Creatinine, Ser: 0.86 mg/dL (ref 0.44–1.00)
Glucose, Bld: 101 mg/dL — ABNORMAL HIGH (ref 65–99)
Potassium: 4 mmol/L (ref 3.5–5.1)
Sodium: 137 mmol/L (ref 135–145)

## 2017-02-10 LAB — ABO/RH: ABO/RH(D): O POS

## 2017-02-10 NOTE — Pre-Procedure Instructions (Signed)
Dr. Sabra Heck viewed and okay 'd EKG

## 2017-02-17 ENCOUNTER — Other Ambulatory Visit: Payer: Self-pay

## 2017-02-17 ENCOUNTER — Inpatient Hospital Stay (HOSPITAL_COMMUNITY): Payer: BC Managed Care – PPO | Admitting: Certified Registered"

## 2017-02-17 ENCOUNTER — Encounter (HOSPITAL_COMMUNITY): Payer: Self-pay

## 2017-02-17 ENCOUNTER — Encounter (HOSPITAL_COMMUNITY)
Admission: AD | Disposition: A | Discharge status: Home / Self Care | Payer: Self-pay | Source: Ambulatory Visit | Attending: Obstetrics and Gynecology

## 2017-02-17 ENCOUNTER — Inpatient Hospital Stay (HOSPITAL_COMMUNITY)
Admission: AD | Admit: 2017-02-17 | Discharge: 2017-02-19 | DRG: 742 | Disposition: A | Discharge status: Home / Self Care | Payer: BC Managed Care – PPO | Source: Ambulatory Visit | Attending: Obstetrics and Gynecology | Admitting: Obstetrics and Gynecology

## 2017-02-17 DIAGNOSIS — E876 Hypokalemia: Secondary | ICD-10-CM | POA: Diagnosis present

## 2017-02-17 DIAGNOSIS — D251 Intramural leiomyoma of uterus: Secondary | ICD-10-CM | POA: Diagnosis present

## 2017-02-17 DIAGNOSIS — I10 Essential (primary) hypertension: Secondary | ICD-10-CM | POA: Diagnosis present

## 2017-02-17 DIAGNOSIS — N920 Excessive and frequent menstruation with regular cycle: Secondary | ICD-10-CM | POA: Diagnosis present

## 2017-02-17 DIAGNOSIS — Z6841 Body Mass Index (BMI) 40.0 and over, adult: Secondary | ICD-10-CM | POA: Diagnosis not present

## 2017-02-17 DIAGNOSIS — Z90710 Acquired absence of both cervix and uterus: Secondary | ICD-10-CM

## 2017-02-17 DIAGNOSIS — N939 Abnormal uterine and vaginal bleeding, unspecified: Secondary | ICD-10-CM | POA: Diagnosis present

## 2017-02-17 HISTORY — PX: HYSTERECTOMY ABDOMINAL WITH SALPINGECTOMY: SHX6725

## 2017-02-17 HISTORY — DX: Intramural leiomyoma of uterus: D25.1

## 2017-02-17 HISTORY — DX: Acquired absence of both cervix and uterus: Z90.710

## 2017-02-17 LAB — PREGNANCY, URINE: PREG TEST UR: NEGATIVE

## 2017-02-17 SURGERY — HYSTERECTOMY, TOTAL, ABDOMINAL, WITH SALPINGECTOMY
Anesthesia: General | Site: Uterus | Laterality: Bilateral

## 2017-02-17 MED ORDER — DIPHENHYDRAMINE HCL 50 MG/ML IJ SOLN: 12.5000 mg | Freq: Four times a day (QID) | INTRAMUSCULAR | Status: DC | PRN

## 2017-02-17 MED ORDER — FENTANYL CITRATE (PF) 100 MCG/2ML IJ SOLN
INTRAMUSCULAR | Status: AC
  Administered 2017-02-17: 50 ug via INTRAVENOUS
  Filled 2017-02-17: qty 2

## 2017-02-17 MED ORDER — ALUM & MAG HYDROXIDE-SIMETH 200-200-20 MG/5ML PO SUSP: 30.0000 mL | ORAL | Status: DC | PRN

## 2017-02-17 MED ORDER — SUGAMMADEX SODIUM 200 MG/2ML IV SOLN
INTRAVENOUS | Status: DC | PRN
  Administered 2017-02-17: 200 mg via INTRAVENOUS

## 2017-02-17 MED ORDER — HYDROMORPHONE HCL 1 MG/ML IJ SOLN
0.2500 mg | INTRAMUSCULAR | Status: DC | PRN
  Administered 2017-02-17 (×3): 0.5 mg via INTRAVENOUS

## 2017-02-17 MED ORDER — METOPROLOL SUCCINATE ER 100 MG PO TB24
200.0000 mg | ORAL_TABLET | Freq: Every day | ORAL | Status: DC
  Filled 2017-02-17 (×2): qty 2

## 2017-02-17 MED ORDER — AZILSARTAN-CHLORTHALIDONE 40-25 MG PO TABS: 1.0000 | ORAL_TABLET | Freq: Every day | ORAL | Status: DC

## 2017-02-17 MED ORDER — FENTANYL CITRATE (PF) 250 MCG/5ML IJ SOLN
INTRAMUSCULAR | Status: AC
  Filled 2017-02-17: qty 5

## 2017-02-17 MED ORDER — AMLODIPINE BESYLATE 10 MG PO TABS
5.0000 mg | ORAL_TABLET | Freq: Every day | ORAL | Status: DC
  Filled 2017-02-17: qty 1

## 2017-02-17 MED ORDER — HYDROMORPHONE 1 MG/ML IV SOLN
INTRAVENOUS | Status: DC
  Administered 2017-02-17: 0.6 mg via INTRAVENOUS
  Administered 2017-02-17: 3.6 mg via INTRAVENOUS
  Administered 2017-02-17: 15:00:00 via INTRAVENOUS
  Administered 2017-02-18: 0.3 mg via INTRAVENOUS
  Administered 2017-02-18: 0.9 mg via INTRAVENOUS
  Filled 2017-02-17: qty 25

## 2017-02-17 MED ORDER — FENTANYL CITRATE (PF) 100 MCG/2ML IJ SOLN
INTRAMUSCULAR | Status: AC
  Filled 2017-02-17: qty 2

## 2017-02-17 MED ORDER — SIMETHICONE 80 MG PO CHEW: 80.0000 mg | CHEWABLE_TABLET | Freq: Four times a day (QID) | ORAL | Status: DC | PRN

## 2017-02-17 MED ORDER — MIDAZOLAM HCL 2 MG/2ML IJ SOLN
INTRAMUSCULAR | Status: AC
  Filled 2017-02-17: qty 2

## 2017-02-17 MED ORDER — SCOPOLAMINE 1 MG/3DAYS TD PT72
MEDICATED_PATCH | TRANSDERMAL | Status: AC
  Administered 2017-02-17: 1.5 mg via TRANSDERMAL
  Filled 2017-02-17: qty 1

## 2017-02-17 MED ORDER — ONDANSETRON HCL 4 MG/2ML IJ SOLN: 4.0000 mg | Freq: Four times a day (QID) | INTRAMUSCULAR | Status: DC | PRN

## 2017-02-17 MED ORDER — HYDROMORPHONE HCL 1 MG/ML IJ SOLN
INTRAMUSCULAR | Status: AC
  Filled 2017-02-17: qty 0.5

## 2017-02-17 MED ORDER — OXYCODONE-ACETAMINOPHEN 5-325 MG PO TABS
1.0000 | ORAL_TABLET | ORAL | Status: DC | PRN
  Administered 2017-02-18: 1 via ORAL
  Filled 2017-02-17: qty 1

## 2017-02-17 MED ORDER — DIPHENHYDRAMINE HCL 12.5 MG/5ML PO ELIX: 12.5000 mg | ORAL_SOLUTION | Freq: Four times a day (QID) | ORAL | Status: DC | PRN

## 2017-02-17 MED ORDER — HYDRALAZINE HCL 20 MG/ML IJ SOLN: 10.0000 mg | INTRAMUSCULAR | Status: DC | PRN

## 2017-02-17 MED ORDER — CITALOPRAM HYDROBROMIDE 20 MG PO TABS
20.0000 mg | ORAL_TABLET | Freq: Every day | ORAL | Status: DC
  Administered 2017-02-17 – 2017-02-19 (×3): 20 mg via ORAL
  Filled 2017-02-17 (×3): qty 1

## 2017-02-17 MED ORDER — LACTATED RINGERS IV SOLN: INTRAVENOUS | Status: DC

## 2017-02-17 MED ORDER — PROPOFOL 10 MG/ML IV BOLUS
INTRAVENOUS | Status: AC
  Filled 2017-02-17: qty 20

## 2017-02-17 MED ORDER — IRBESARTAN 300 MG PO TABS
300.0000 mg | ORAL_TABLET | Freq: Every day | ORAL | Status: DC
  Filled 2017-02-17 (×3): qty 1

## 2017-02-17 MED ORDER — ROCURONIUM BROMIDE 100 MG/10ML IV SOLN
INTRAVENOUS | Status: AC
  Filled 2017-02-17: qty 1

## 2017-02-17 MED ORDER — PANTOPRAZOLE SODIUM 40 MG PO TBEC
40.0000 mg | DELAYED_RELEASE_TABLET | Freq: Every day | ORAL | Status: DC
  Administered 2017-02-18 – 2017-02-19 (×2): 40 mg via ORAL
  Filled 2017-02-17 (×2): qty 1

## 2017-02-17 MED ORDER — SUGAMMADEX SODIUM 200 MG/2ML IV SOLN
INTRAVENOUS | Status: AC
  Filled 2017-02-17: qty 2

## 2017-02-17 MED ORDER — POTASSIUM CHLORIDE CRYS ER 20 MEQ PO TBCR
20.0000 meq | EXTENDED_RELEASE_TABLET | Freq: Two times a day (BID) | ORAL | Status: DC
  Administered 2017-02-17 – 2017-02-19 (×4): 20 meq via ORAL
  Filled 2017-02-17 (×5): qty 1

## 2017-02-17 MED ORDER — LIDOCAINE 2% (20 MG/ML) 5 ML SYRINGE
INTRAMUSCULAR | Status: DC | PRN
  Administered 2017-02-17: 40 mg via INTRAVENOUS

## 2017-02-17 MED ORDER — SODIUM CHLORIDE 0.9% FLUSH: 9.0000 mL | INTRAVENOUS | Status: DC | PRN

## 2017-02-17 MED ORDER — DEXAMETHASONE SODIUM PHOSPHATE 10 MG/ML IJ SOLN
INTRAMUSCULAR | Status: AC
  Filled 2017-02-17: qty 1

## 2017-02-17 MED ORDER — PROPOFOL 10 MG/ML IV BOLUS
INTRAVENOUS | Status: DC | PRN
  Administered 2017-02-17: 200 mg via INTRAVENOUS

## 2017-02-17 MED ORDER — FLUTICASONE PROPIONATE 50 MCG/ACT NA SUSP
1.0000 | Freq: Every day | NASAL | Status: DC | PRN
Start: 2017-02-17 — End: 2017-02-19
  Filled 2017-02-17: qty 16

## 2017-02-17 MED ORDER — ONDANSETRON HCL 4 MG/2ML IJ SOLN
INTRAMUSCULAR | Status: AC
  Filled 2017-02-17: qty 2

## 2017-02-17 MED ORDER — LEVOTHYROXINE SODIUM 175 MCG PO TABS
175.0000 ug | ORAL_TABLET | Freq: Every day | ORAL | Status: DC
  Administered 2017-02-18 – 2017-02-19 (×2): 175 ug via ORAL
  Filled 2017-02-17 (×2): qty 1

## 2017-02-17 MED ORDER — FENTANYL CITRATE (PF) 100 MCG/2ML IJ SOLN
25.0000 ug | INTRAMUSCULAR | Status: DC | PRN
  Administered 2017-02-17 (×2): 50 ug via INTRAVENOUS

## 2017-02-17 MED ORDER — ONDANSETRON HCL 4 MG/2ML IJ SOLN
INTRAMUSCULAR | Status: DC | PRN
  Administered 2017-02-17: 4 mg via INTRAVENOUS

## 2017-02-17 MED ORDER — CHLORTHALIDONE 25 MG PO TABS
25.0000 mg | ORAL_TABLET | Freq: Every day | ORAL | Status: DC
  Filled 2017-02-17 (×3): qty 1

## 2017-02-17 MED ORDER — LACTATED RINGERS IV SOLN
INTRAVENOUS | Status: DC
  Administered 2017-02-17: 125 mL/h via INTRAVENOUS
  Administered 2017-02-18: 06:00:00 via INTRAVENOUS

## 2017-02-17 MED ORDER — NALOXONE HCL 0.4 MG/ML IJ SOLN: 0.4000 mg | INTRAMUSCULAR | Status: DC | PRN

## 2017-02-17 MED ORDER — HYDROMORPHONE HCL 1 MG/ML IJ SOLN
INTRAMUSCULAR | Status: DC | PRN
  Administered 2017-02-17 (×2): 0.5 mg via INTRAVENOUS

## 2017-02-17 MED ORDER — KETOROLAC TROMETHAMINE 30 MG/ML IJ SOLN
30.0000 mg | Freq: Four times a day (QID) | INTRAMUSCULAR | Status: DC
  Administered 2017-02-17 – 2017-02-18 (×4): 30 mg via INTRAVENOUS
  Filled 2017-02-17 (×4): qty 1

## 2017-02-17 MED ORDER — LACTATED RINGERS IV SOLN
INTRAVENOUS | Status: DC
  Administered 2017-02-17: 09:00:00 via INTRAVENOUS
  Administered 2017-02-17: 125 mL/h via INTRAVENOUS

## 2017-02-17 MED ORDER — HYDROMORPHONE HCL 1 MG/ML IJ SOLN
INTRAMUSCULAR | Status: AC
  Administered 2017-02-17: 0.5 mg via INTRAVENOUS
  Filled 2017-02-17: qty 0.5

## 2017-02-17 MED ORDER — FENTANYL CITRATE (PF) 100 MCG/2ML IJ SOLN
INTRAMUSCULAR | Status: DC | PRN
  Administered 2017-02-17 (×3): 50 ug via INTRAVENOUS
  Administered 2017-02-17: 100 ug via INTRAVENOUS
  Administered 2017-02-17: 50 ug via INTRAVENOUS

## 2017-02-17 MED ORDER — HYDROMORPHONE HCL 1 MG/ML IJ SOLN
INTRAMUSCULAR | Status: AC
  Filled 2017-02-17: qty 1

## 2017-02-17 MED ORDER — SCOPOLAMINE 1 MG/3DAYS TD PT72
1.0000 | MEDICATED_PATCH | Freq: Once | TRANSDERMAL | Status: DC
  Administered 2017-02-17: 1.5 mg via TRANSDERMAL

## 2017-02-17 MED ORDER — MENTHOL 3 MG MT LOZG
1.0000 | LOZENGE | OROMUCOSAL | Status: DC | PRN
  Administered 2017-02-18: 3 mg via ORAL
  Filled 2017-02-17: qty 9

## 2017-02-17 MED ORDER — MIDAZOLAM HCL 5 MG/5ML IJ SOLN
INTRAMUSCULAR | Status: DC | PRN
  Administered 2017-02-17: 2 mg via INTRAVENOUS

## 2017-02-17 MED ORDER — MEPERIDINE HCL 25 MG/ML IJ SOLN
6.2500 mg | INTRAMUSCULAR | Status: DC | PRN
  Administered 2017-02-17: 12.5 mg via INTRAVENOUS

## 2017-02-17 MED ORDER — MEPERIDINE HCL 25 MG/ML IJ SOLN
INTRAMUSCULAR | Status: AC
  Administered 2017-02-17: 12.5 mg via INTRAVENOUS
  Filled 2017-02-17: qty 1

## 2017-02-17 MED ORDER — KETOROLAC TROMETHAMINE 30 MG/ML IJ SOLN: 30.0000 mg | Freq: Four times a day (QID) | INTRAMUSCULAR | Status: DC

## 2017-02-17 MED ORDER — ROCURONIUM BROMIDE 10 MG/ML (PF) SYRINGE
PREFILLED_SYRINGE | INTRAVENOUS | Status: DC | PRN
  Administered 2017-02-17: 10 mg via INTRAVENOUS
  Administered 2017-02-17: 5 mg via INTRAVENOUS
  Administered 2017-02-17: 10 mg via INTRAVENOUS
  Administered 2017-02-17: 45 mg via INTRAVENOUS
  Administered 2017-02-17: 10 mg via INTRAVENOUS
  Administered 2017-02-17: 20 mg via INTRAVENOUS

## 2017-02-17 MED ORDER — DEXAMETHASONE SODIUM PHOSPHATE 10 MG/ML IJ SOLN
INTRAMUSCULAR | Status: DC | PRN
  Administered 2017-02-17: 10 mg via INTRAVENOUS

## 2017-02-17 MED ORDER — IBUPROFEN 800 MG PO TABS
800.0000 mg | ORAL_TABLET | Freq: Three times a day (TID) | ORAL | Status: DC | PRN
  Administered 2017-02-18 – 2017-02-19 (×3): 800 mg via ORAL
  Filled 2017-02-17 (×3): qty 1

## 2017-02-17 MED ORDER — METOCLOPRAMIDE HCL 5 MG/ML IJ SOLN
INTRAMUSCULAR | Status: DC | PRN
  Administered 2017-02-17: 10 mg via INTRAVENOUS

## 2017-02-17 MED ORDER — DEXTROSE 5 % IV SOLN
3.0000 g | INTRAVENOUS | Status: AC
  Administered 2017-02-17: 3 g via INTRAVENOUS
  Filled 2017-02-17: qty 3

## 2017-02-17 MED ORDER — METOCLOPRAMIDE HCL 5 MG/ML IJ SOLN: 10.0000 mg | Freq: Once | INTRAMUSCULAR | Status: DC | PRN

## 2017-02-17 MED ORDER — METOCLOPRAMIDE HCL 5 MG/ML IJ SOLN
INTRAMUSCULAR | Status: AC
  Filled 2017-02-17: qty 2

## 2017-02-17 SURGICAL SUPPLY — 45 items
BENZOIN TINCTURE PRP APPL 2/3 (GAUZE/BANDAGES/DRESSINGS) ×2 IMPLANT
CANISTER SUCT 3000ML PPV (MISCELLANEOUS) ×2 IMPLANT
CLOTH BEACON ORANGE TIMEOUT ST (SAFETY) ×2 IMPLANT
CONT PATH 16OZ SNAP LID 3702 (MISCELLANEOUS) ×2 IMPLANT
DECANTER SPIKE VIAL GLASS SM (MISCELLANEOUS) IMPLANT
DRAPE WARM FLUID 44X44 (DRAPE) ×2 IMPLANT
DRESSING DISP NPWT PICO 4X12 (MISCELLANEOUS) ×2 IMPLANT
DRSG OPSITE POSTOP 4X10 (GAUZE/BANDAGES/DRESSINGS) ×2 IMPLANT
DURAPREP 26ML APPLICATOR (WOUND CARE) ×2 IMPLANT
GAUZE SPONGE 4X4 16PLY XRAY LF (GAUZE/BANDAGES/DRESSINGS) ×2 IMPLANT
GLOVE BIOGEL M 6.5 STRL (GLOVE) ×4 IMPLANT
GLOVE BIOGEL PI IND STRL 6.5 (GLOVE) ×1 IMPLANT
GLOVE BIOGEL PI IND STRL 7.0 (GLOVE) ×4 IMPLANT
GLOVE BIOGEL PI IND STRL 7.5 (GLOVE) ×1 IMPLANT
GLOVE BIOGEL PI INDICATOR 6.5 (GLOVE) ×1
GLOVE BIOGEL PI INDICATOR 7.0 (GLOVE) ×4
GLOVE BIOGEL PI INDICATOR 7.5 (GLOVE) ×1
GOWN STRL REUS W/TWL LRG LVL3 (GOWN DISPOSABLE) ×8 IMPLANT
HEMOSTAT ARISTA ABSORB 3G PWDR (MISCELLANEOUS) ×2 IMPLANT
NEEDLE HYPO 22GX1.5 SAFETY (NEEDLE) IMPLANT
NS IRRIG 1000ML POUR BTL (IV SOLUTION) ×2 IMPLANT
PACK ABDOMINAL GYN (CUSTOM PROCEDURE TRAY) ×2 IMPLANT
PAD OB MATERNITY 4.3X12.25 (PERSONAL CARE ITEMS) ×2 IMPLANT
PENCIL SMOKE EVAC W/HOLSTER (ELECTROSURGICAL) ×2 IMPLANT
PROTECTOR NERVE ULNAR (MISCELLANEOUS) ×4 IMPLANT
SPONGE LAP 18X18 X RAY DECT (DISPOSABLE) ×14 IMPLANT
STAPLER VISISTAT 35W (STAPLE) IMPLANT
STRIP CLOSURE SKIN 1/2X4 (GAUZE/BANDAGES/DRESSINGS) ×2 IMPLANT
SUT PDS AB 0 CT1 27 (SUTURE) ×4 IMPLANT
SUT PLAIN 2 0 XLH (SUTURE) ×2 IMPLANT
SUT VIC AB 0 CT1 18XCR BRD8 (SUTURE) ×2 IMPLANT
SUT VIC AB 0 CT1 27 (SUTURE) ×8
SUT VIC AB 0 CT1 27XCR 8 STRN (SUTURE) ×4 IMPLANT
SUT VIC AB 0 CT1 36 (SUTURE) ×2 IMPLANT
SUT VIC AB 0 CT1 8-18 (SUTURE) ×4
SUT VIC AB 2-0 CT1 (SUTURE) IMPLANT
SUT VIC AB 2-0 CT1 27 (SUTURE) ×2
SUT VIC AB 2-0 CT1 TAPERPNT 27 (SUTURE) ×1 IMPLANT
SUT VIC AB 2-0 SH 27 (SUTURE) ×2
SUT VIC AB 2-0 SH 27XBRD (SUTURE) ×1 IMPLANT
SUT VIC AB 4-0 KS 27 (SUTURE) ×4 IMPLANT
SUT VICRYL 0 TIES 12 18 (SUTURE) ×2 IMPLANT
SYR CONTROL 10ML LL (SYRINGE) IMPLANT
TOWEL OR 17X24 6PK STRL BLUE (TOWEL DISPOSABLE) ×4 IMPLANT
TRAY FOLEY CATH SILVER 14FR (SET/KITS/TRAYS/PACK) ×2 IMPLANT

## 2017-02-17 NOTE — Anesthesia Preprocedure Evaluation (Signed)
Anesthesia Evaluation  Patient identified by MRN, date of birth, ID band Patient awake    Reviewed: Allergy & Precautions, NPO status , Patient's Chart, lab work & pertinent test results  History of Anesthesia Complications Negative for: history of anesthetic complications  Airway Mallampati: I  TM Distance: >3 FB Neck ROM: Full    Dental no notable dental hx.    Pulmonary neg pulmonary ROS,    Pulmonary exam normal breath sounds clear to auscultation       Cardiovascular hypertension, Pt. on medications negative cardio ROS Normal cardiovascular exam Rhythm:Regular Rate:Normal     Neuro/Psych negative neurological ROS  negative psych ROS   GI/Hepatic Neg liver ROS, hiatal hernia, GERD  ,  Endo/Other  Morbid obesity  Renal/GU negative Renal ROS  negative genitourinary   Musculoskeletal negative musculoskeletal ROS (+)   Abdominal   Peds negative pediatric ROS (+)  Hematology negative hematology ROS (+)   Anesthesia Other Findings   Reproductive/Obstetrics negative OB ROS                             Anesthesia Physical Anesthesia Plan  ASA: III  Anesthesia Plan: General   Post-op Pain Management:    Induction: Intravenous  PONV Risk Score and Plan: 4 or greater and Scopolamine patch - Pre-op, Midazolam, Dexamethasone, Ondansetron and Treatment may vary due to age or medical condition  Airway Management Planned: Oral ETT  Additional Equipment:   Intra-op Plan:   Post-operative Plan: Extubation in OR  Informed Consent: I have reviewed the patients History and Physical, chart, labs and discussed the procedure including the risks, benefits and alternatives for the proposed anesthesia with the patient or authorized representative who has indicated his/her understanding and acceptance.   Dental advisory given  Plan Discussed with: CRNA  Anesthesia Plan Comments:          Anesthesia Quick Evaluation

## 2017-02-17 NOTE — Anesthesia Postprocedure Evaluation (Signed)
Anesthesia Post Note  Patient: Consulting civil engineer  Procedure(s) Performed: HYSTERECTOMY ABDOMINAL WITH SALPINGECTOMY (Bilateral Uterus)     Patient location during evaluation: PACU Anesthesia Type: General Level of consciousness: awake and alert Pain management: pain level controlled Vital Signs Assessment: post-procedure vital signs reviewed and stable Respiratory status: spontaneous breathing, nonlabored ventilation, respiratory function stable and patient connected to nasal cannula oxygen Cardiovascular status: blood pressure returned to baseline and stable Postop Assessment: no apparent nausea or vomiting Anesthetic complications: no    Last Vitals:  Vitals:   02/17/17 1200 02/17/17 1230  BP: (!) 172/81 (!) 165/89  Pulse: 74 80  Resp: (!) 24 (!) 27  Temp:    SpO2: 100% 100%    Last Pain:  Vitals:   02/17/17 0732  TempSrc: Oral   Pain Goal: Patients Stated Pain Goal: 3 (02/17/17 0732)               Montez Hageman

## 2017-02-17 NOTE — Anesthesia Procedure Notes (Signed)
Procedure Name: Intubation Date/Time: 02/17/2017 8:46 AM Performed by: Verlin Grills, CRNA Pre-anesthesia Checklist: Patient identified, Emergency Drugs available, Suction available and Patient being monitored Patient Re-evaluated:Patient Re-evaluated prior to induction Oxygen Delivery Method: Circle system utilized Preoxygenation: Pre-oxygenation with 100% oxygen Induction Type: IV induction Ventilation: Oral airway inserted - appropriate to patient size and Two handed mask ventilation required Laryngoscope Size: Mac and 3 Grade View: Grade III Tube type: Oral Tube size: 7.0 mm Number of attempts: 2 Airway Equipment and Method: Oral airway and Bougie stylet Placement Confirmation: ETT inserted through vocal cords under direct vision,  positive ETCO2 and breath sounds checked- equal and bilateral Secured at: 22 cm Tube secured with: Tape Dental Injury: Teeth and Oropharynx as per pre-operative assessment  Comments: Initial attempt w/ Sabra Heck 2, cords closed, mask ventilation and view w/ Mac 3 showed redundant tissue, bougie stylet used w/o difficulty. Teeth/lips as pre op.

## 2017-02-17 NOTE — Transfer of Care (Signed)
Immediate Anesthesia Transfer of Care Note  Patient: Loretta Nelson  Procedure(s) Performed: HYSTERECTOMY ABDOMINAL WITH SALPINGECTOMY (Bilateral Uterus)  Patient Location: PACU  Anesthesia Type:General  Level of Consciousness: awake, oriented, drowsy and patient cooperative  Airway & Oxygen Therapy: Patient Spontanous Breathing and Patient connected to face mask oxygen  Post-op Assessment: Report given to RN, Post -op Vital signs reviewed and stable and Patient moving all extremities  Post vital signs: Reviewed and stable  Last Vitals:  Vitals:   02/17/17 0732  BP: (!) 141/68  Pulse: 67  Resp: 20  Temp: 36.6 C  SpO2: 96%    Last Pain:  Vitals:   02/17/17 0732  TempSrc: Oral      Patients Stated Pain Goal: 3 (74/08/14 4818)  Complications: No apparent anesthesia complications

## 2017-02-17 NOTE — Op Note (Signed)
Hysterectomy Procedure Note  Indications: Uterine Fibroids and Menorrhagia   Pre-operative Diagnosis: Uterine fibroids and Menorrhagia   Post-operative Diagnosis: Uterine fibroids/ Menorrhagia/ and Pelvic adhesive disease   Operation: Total abdominal hysterectomy with bilateral salpingectomy   Surgeon: Catha Brow. M.D.  Assistants: Dr. Otis Peak   Anesthesia: General endotracheal anesthesia  ASA Class: 2   Findings: Multiple fibroid uterus. Normal fallopian tubes and ovaries bilaterally Adhesions of the uterus to the anterior abdominal wall and bladder noted.   Procedure Details  The patient was seen in the Holding Room. The risks, benefits, complications, treatment options, and expected outcomes were discussed with the patient.  The patient concurred with the proposed plan, giving informed consent.  The site of surgery properly noted/marked. The patient was taken to Operating Room # 4, identified as Loretta Nelson and the procedure verified as Total abdominal hysterectomy with bilateral salpingectomy . A Time Out was held and the above information confirmed.  After induction of anesthesia, the patient was draped and prepped in the usual sterile manner. Pt was placed in supine position after anesthesia and draped and prepped in the usual sterile manner. Foley catheter was placed.  A Pfannenstiel  incision was made and carried through the subcutaneous tissue to the fascia. Fascial incision was made and extended laterally . The rectus muscles were separated. The peritoneum was identified and entered. Peritoneal incision was extended longitudinally.  The above findings were noted. Kirt Boys  retractor was placed and bowel was packed away from the surgical site.   The round ligaments were identified, cut, and ligated with 0-Vicryl. The anterior peritoneal reflection was incised and the bladder was dissected off the lower uterine segment. The retroperitoneal space was explored and  the ureters were identified bilaterally. The right utero- ovarian and fallopian tube were grasped, cut, and suture ligated with 0-Vicryl. The left utero- ovarian ligament and fallopian tube were  grasped, cut and suture ligated with 0-Vicryl. Hemostasis  was observed. The uterine vessels were skeletonized, then clamped, cut and suture ligated with 0-Vicryl suture. Serial pedicles of the cardinal and utero-sacral ligaments were clamped, cut, and suture ligated with 0-Vicryl. Entrance was made into the vagina and the uterus removed. Vaginal cuff angle sutures were placed incorporating the utero-sacral ligaments for support. The vaginal cuff was then closed with a interrupted  Figure of 8 stitch of 0- Vicryl. Lavage was carried out until clear. Hemostasis was observed. Slight ooze was noted from the vaginal cuff and arista was applied.   Retractor and all packing was removed from the abdomen. The fascia was approximated with running sutures of 0-Vicryl. Lavage was again carried out. Hemostasis was observed. The skin was approximated with 4-0 vicryl   Instrument, sponge, and needle counts were correct prior to abdominal closure and at the conclusion of the case.     Estimated Blood Loss:  300         Drains: Foley catheter          Total IV Fluids: per anesthesia ml         Specimens: Uterus cervix and bilateral fallopian tubes          Implants: None         Complications:  None; patient tolerated the procedure well.         Disposition: PACU - hemodynamically stable.         Condition: stable  Attending Attestation: I performed the procedure.

## 2017-02-17 NOTE — OR Nursing (Signed)
Dr Marcell Barlow aware of elevated bp. Pt assymptomatic. Darin Engels rn

## 2017-02-17 NOTE — OR Nursing (Signed)
Transported to her room by paige grady rn

## 2017-02-17 NOTE — H&P (Signed)
Subjective: Chief Complaint(s):   PreOp for 02/17/17.   HPI:  General 51 y/o presents for preop history and physical in preparation for TAH/ BS she has heavy vaginal bleeding that started on 12/15/2016. It began 5 days earlier than it was suppose to start. she reports spotting on and off for 2 days bleeding restarted the third day. she changed a ultra tampon and pad every 2 hours on her heaviest day. no hygiene acidents. this the first time that she has had irregular bleedign. she had bleeding for 10 days.  she has known uterine fibroids. she was started on lysteda 09/2016. and menses were intially lighter with the lysteda but this did not work for he during this menses. sh e is now interested in definitive therapy via hysterectomy.  ultrasound 11/01/2016 shows multiple uterine fibroids. the uterus measures 15.7 cm x 7.1 cm x 10.3 cm. Current Medication: Taking  Vitamin D 4000 units Capsule 1 capsule Orally once a day     Claritin(Loratadine) 10 MG Tablet Orally Once a day- as needed     Synthroid(L-Thyroxine Sodium) 175 MCG Tablet 1 tablet every morning on an empty stomach once a day orally DAW-1 Orally Once a day     Nu-Iron(Polysaccharide Iron Complex) 150 MG Capsule 1 capsule Orally Once a day     Omeprazole 40 MG Capsule Delayed Release 1 capsule Orally Once a day     Lysteda 650 MG Tablet 2 tablets Orally Three times daily during menses     Citalopram Hydrobromide 20 MG tablet 1 tablet by mouth once a day     Metoprolol Succinate ER 200 MG Tablet Extended Release 24 Hour 1 tablet Orally Once a day     Amlodipine Besylate 5 MG Tablet 1 tablet Orally Once a day     Edarbyclor(Azilsartan-Chlorthalidone) 40-25 MG Tablet 1 tablet Orally Once a day   Not-Taking  Klor-Con M20(Potassium Chloride) 20 MEQ Tablet Extended Release 1 tablet with food Orally twice a day     Medication List reviewed and reconciled with the patient   Medical History:  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 Altheimer, MD) post treatment hypothyroidism, released to primary care     hypertension, since her mid 50s, ECHO 7/12, mild LVH, hyperdynamic contraction     obesity     history of a benign murmur--caused by hyperdynamic contraction on echo 7/12     allergic rhinitis     migraine HA     history of esophageal stricture, small hiatal hernia     anxiety     vitamin D deficiency      Allergies/Intolerance:  N.K.D.A.   Gyn History:  Sexual activity not currently sexually active. Periods : irregular. LMP 01/10/2017. Birth control none. Last pap smear date 11/01/2016. Last mammogram date 09/06/16. Denies Abnormal pap smear. Denies STD. Menarche 70.   OB History:  Number of pregnancies 2. Pregnancy # 1 miscarriage ~ 10 wks (D & C). Pregnancy # 2 11/10/97, live birth, C-section, twin girls.   Surgical History:  C-section 11/10/97     Dilatation of esophageal stricture 06/2009     D & D ([facility])   Hospitalization:  c-section (twins)   Family History:  Father: deceased, alcohol abuse    Mother: alive, Hypertension. Hypothyroidism.    Paternal [place] Father: deceased    Paternal Grand Mother: deceased, Breast cancer.    Maternal Grand Father: deceased, Coronary artery disease.    Maternal Grand Mother: deceased  Brother 1: deceased, Died of HIV.    Brother2: deceased 6 yrs, died of sudden MI vs PE--collapsed after finishing his daily run, had been complaining of his leg hurting    Sister 1: alive, Breast cancer.    Sister 2: alive, Healthy.    Daughter(s): diabetes    2 brother(s) , 2 sister(s) . 2daughter(s) .    GI: negative for colon cancer, colon polyps and liver disease.  Social History: General Tobacco use cigarettes: Never smoked, Tobacco history last updated 02/09/2017.  no Alcohol, None.  no Recreational drug use, None.  Marital Status: Divorced 5/15 She lives with her 2 daughters.  Children:  Gaylene Brooks (type I DM) (twins, Dad in [place]).  EDUCATION: Masters degree in education.  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  Religion: Love and Faith.   ROS: CONSTITUTIONAL No" options="no,yes" propid="91" itemid="193425" categoryid="10464" encounterid="[id number]"Chills No. No" options="no,yes" propid="91" itemid="172899" categoryid="10464" encounterid="[id number]"Fatigue No. No" options="no,yes" propid="91" itemid="10467" categoryid="10464" encounterid="[id number]"Fever No. No" options="no,yes" propid="91" itemid="193426" categoryid="10464" encounterid="[id number]"Night sweats No. No" options="no,yes" propid="91" itemid="444261" categoryid="10464" encounterid="[id number]"Recent travel outside [place] No. No" options="no,yes" propid="91" itemid="193427" categoryid="10464" encounterid="[id number]"Sweats No. No" options="no,yes" propid="91" itemid="194825" categoryid="10464" encounterid="[id number]"Weight change No.  OPHTHALMOLOGY no" options="no,yes" propid="91" itemid="12520" categoryid="12516" encounterid="[id number]"Blurring of vision no. no" options="no,yes" propid="91" itemid="193469" categoryid="12516" encounterid="[id number]"Change in vision no. no" options="no,yes" propid="91" itemid="194379" categoryid="12516" encounterid="[id number]"Double vision no.  ENT no" options="no,yes" propid="91" itemid="193612" categoryid="10481" encounterid="[id number]"Dizziness no. Nose bleeds no. Sore throat no. Teeth pain no.  ALLERGY no" options="no,yes" propid="91" itemid="202589" categoryid="138152" encounterid="[id number]"Hives no.  CARDIOLOGY no" options="no,yes" propid="91" itemid="193603" categoryid="10488" encounterid="[id number]"Chest pain no. no" options="no,yes" propid="91" itemid="199089" categoryid="10488" encounterid="[id number]"High blood pressure no. no" options="no,yes" propid="91" itemid="202598" categoryid="10488" encounterid="[id number]"Irregular heart beat no. no" options="no,yes"  propid="91" itemid="10491" categoryid="10488" encounterid="[id number]"Leg edema no. no" options="no,yes" propid="91" itemid="10490" categoryid="10488" encounterid="[id number]"Palpitations no.  RESPIRATORY no" options="no" propid="91" itemid="270013" categoryid="138132" encounterid="[id number]"Shortness of breath no. no" options="no,yes" propid="91" itemid="172745" categoryid="138132" encounterid="[id number]"Cough no. no" options="no,yes" propid="91" itemid="193621" categoryid="138132" encounterid="[id number]"Wheezing no.  UROLOGY no" options="no,yes" propid="91" [phone]" categoryid="138166" encounterid="[id number]"Pain with urination no. no" options="no,yes" propid="91" itemid="193493" categoryid="138166" encounterid="[id number]"Urinary urgency no. no" options="no,yes" propid="91" itemid="193492" categoryid="138166" encounterid="[id number]"Urinary frequency no. no" options="no,yes" propid="91" itemid="138171" categoryid="138166" encounterid="[id number]"Urinary incontinence no. No" options="no,yes" propid="91" itemid="138167" categoryid="138166" encounterid="[id number]"Difficulty urinating No. No" options="no,yes" propid="91" itemid="138168" categoryid="138166" encounterid="[id number]"Blood in urine No.  GASTROENTEROLOGY no" options="no,yes" propid="91" itemid="10496" categoryid="10494" encounterid="[id number]"Abdominal pain no. no" options="no,yes" propid="91" itemid="193447" categoryid="10494" encounterid="[id number]"Appetite change no. no" options="no,yes" propid="91" itemid="193448" categoryid="10494" encounterid="[id number]"Bloating/belching no. no" options="no,yes" propid="91" itemid="10503" categoryid="10494" encounterid="[id number]"Blood in stool or on toilet paper no. no" options="no,yes" propid="91" itemid="199106" categoryid="10494" encounterid="[id number]"Change in bowel movements no. no" options="no,yes" propid="91" itemid="10501" categoryid="10494" encounterid="[id number]"Constipation no. no" options="no,yes" propid="91" itemid="10502"  categoryid="10494" encounterid="[id number]"Diarrhea no. no" options="no,yes" propid="91" itemid="199104" categoryid="10494" encounterid="[id number]"Difficulty swallowing no. no" options="no,yes" propid="91" itemid="10499" categoryid="10494" encounterid="[id number]"Nausea no.  FEMALE REPRODUCTIVE no" options="no,yes" propid="91" itemid="453725" categoryid="10525" encounterid="[id number]"Vulvar pain no. no" options="no,yes" propid="91" itemid="453726" categoryid="10525" encounterid="[id number]"Vulvar rash no. no" options="no, yes" propid="91" itemid="444315" categoryid="10525" encounterid="[id number]"Abnormal vaginal bleeding no. no" options="no,yes" propid="91" itemid="186083" categoryid="10525" encounterid="[id number]"Breast pain no. no" options="no,yes" propid="91" itemid="186084" categoryid="10525" encounterid="[id number]"Nipple discharge no. no" options="no,yes" propid="91" itemid="275823" categoryid="10525" encounterid="[id number]"Pain with intercourse no. no" options="no,yes" propid="91" itemid="186082" categoryid="10525" encounterid="[id number]"Pelvic pain no. no" options="no,yes" propid="91" itemid="278230" categoryid="10525" encounterid="[id number]"Unusual vaginal discharge no. no" options="no,yes" propid="91" itemid="278942" categoryid="10525" encounterid="[id number]"Vaginal itching no.  MUSCULOSKELETAL no" options="no,yes" propid="91" itemid="193461" categoryid="10514" encounterid="[id number]"Muscle aches no.  NEUROLOGY no" options="no,yes" propid="91" itemid="12513" categoryid="12512" encounterid="[id number]"Headache no. no" options="no,yes" propid="91" itemid="12514" categoryid="12512" encounterid="[id number]"Tingling/numbness no. no" options="no,yes" propid="91" itemid="193468" categoryid="12512" encounterid="[id number]"Weakness no.  PSYCHOLOGY no" options="" propid="91" itemid="275919" categoryid="10520" encounterid="[id number]"Depression no. no" options="no,yes" propid="91" itemid="172748" categoryid="10520" encounterid="[id number]"Anxiety no.  no" options="no,yes" propid="91" itemid="199158" categoryid="10520" encounterid="[id number]"Nervousness no. no" options="no,yes" propid="91" itemid="12502" categoryid="10520" encounterid="[id number]"Sleep disturbances no.  no " options="no,yes" propid="91" itemid="72718" categoryid="10520" encounterid="[id number]"Suicidal ideation no .  ENDOCRINOLOGY no" options="no,yes" propid="91" itemid="194628" categoryid="12508" encounterid="[id number]"Excessive thirst no. no" options="no,yes" propid="91" itemid="196285" categoryid="12508" encounterid="[id number]"Excessive urination no. no" options="no, yes" propid="91" itemid="444314" categoryid="12508" encounterid="[id number]"Hair loss no. no" options="" propid="91" itemid="447284" categoryid="12508" encounterid="[id number]"Heat or cold intolerance no.  HEMATOLOGY/LYMPH no" options="no,yes" propid="91" itemid="199152" categoryid="138157" encounterid="[id number]"Abnormal bleeding no. no" options="no,yes" propid="91" itemid="170653" categoryid="138157" encounterid="[id number]"Easy bruising no. no" options="no,yes" propid="91" itemid="138158" categoryid="138157" encounterid="[id number]"Swollen glands no.  DERMATOLOGY no" options="no,yes" propid="91" itemid="199126" categoryid="12503" encounterid="[id number]"New/changing skin lesion no. no" options="no,yes" propid="91" itemid="12504" categoryid="12503" encounterid="[id number]"Rash no. no" options="" propid="91" itemid="444313" categoryid="12503" encounterid="[id number]"Sores no.     Objective: Vitals: Wt 301, Ht 64.75, BMI 50.47, Pulse sitting 67, BP sitting 122/82  Past Results: Examination:  General Examination alert, oriented, NAD " categoryPropId="10089" examid="193638"GENERAL APPEARANCE alert, oriented, NAD .  moist, warm" categoryPropId="10109" examid="193638"SKIN: moist, warm.  Conjunctiva clear" categoryPropId="21468" examid="193638"EYES: Conjunctiva clear.  clear to ausculation bilaterally" categoryPropId="87" examid="193638"LUNGS: clear to  ausculation bilaterally.  regular rate and rhythm" categoryPropId="86" examid="193638"HEART: regular rate and rhythm.  soft, non-tender/non-distended, bowel sounds present " categoryPropId="88" examid="193638"ABDOMEN: soft, non-tender/non-distended, bowel sounds present .  normal external genitalia, labia - unremarkable, vagina - pink moist mucosa, no lesions or abnormal discharge, cervix - no discharge or lesions or CMT, adnexa - no masses or tenderness, uterus -16- 18 wk size uterus " categoryPropId="13414" examid="193638"FEMALE GENITOURINARY: normal external genitalia, labia - unremarkable, vagina - pink moist mucosa, no lesions or abnormal discharge, cervix - no discharge or lesions or CMT, adnexa - no masses or tenderness, uterus -16- 18 wk size uterus .  affect normal, good eye contact" categoryPropId="16316" examid="193638"PSYCH: affect normal, good eye contact.  Physical Examination:    Assessment: Assessment:  Menorrhagia with regular cycle - N92.0 (Primary)     Fibroids - D21.9     Plan: Treatment: Menorrhagia with regular cycle Notes: patient desires definitive therapy via a hysterectomy.. plan total abdominal hysterectomy with bilateral salpingectomy. I discussed with the pt r/b/a of hysterectomy including but not limited to infection/ bleeding damage to bowel bladder ureters and surrounding organs with the need for further surgery. r/o transfustion hiv/ hep B&C discussed. pt is interested in banking her own blood.. Fibroids Notes: patient desires definitive therapy via a hysterectomy.. plan total abdominal hysterectomy with bilateral salpingectomy. I discussed with the pt r/b/a of hysterectomy including but not limited to infection/ bleeding damage to bowel bladder ureters and surrounding organs with the need for further surgery. r/o transfustion hiv/ hep B&C discussed. pt is interested in banking her own blood.. Others

## 2017-02-18 ENCOUNTER — Encounter (HOSPITAL_COMMUNITY): Payer: Self-pay | Admitting: Obstetrics and Gynecology

## 2017-02-18 LAB — BASIC METABOLIC PANEL
ANION GAP: 8 (ref 5–15)
BUN: 18 mg/dL (ref 6–20)
CHLORIDE: 98 mmol/L — AB (ref 101–111)
CO2: 28 mmol/L (ref 22–32)
Calcium: 8.2 mg/dL — ABNORMAL LOW (ref 8.9–10.3)
Creatinine, Ser: 0.88 mg/dL (ref 0.44–1.00)
GFR calc Af Amer: >60 mL/min (ref >60)
Glucose, Bld: 121 mg/dL — ABNORMAL HIGH (ref 65–99)
POTASSIUM: 3.1 mmol/L — AB (ref 3.5–5.1)
SODIUM: 134 mmol/L — AB (ref 135–145)

## 2017-02-18 LAB — CBC
HCT: 30 % — ABNORMAL LOW (ref 36.0–46.0)
HEMOGLOBIN: 9.6 g/dL — AB (ref 12.0–15.0)
MCH: 26.2 pg (ref 26.0–34.0)
MCHC: 32 g/dL (ref 30.0–36.0)
MCV: 81.7 fL (ref 78.0–100.0)
Platelets: 223 10*3/uL (ref 150–400)
RBC: 3.67 MIL/uL — AB (ref 3.87–5.11)
RDW: 14.3 % (ref 11.5–15.5)
WBC: 11.4 10*3/uL — AB (ref 4.0–10.5)

## 2017-02-18 NOTE — Plan of Care (Signed)
Pt. Healing without complications

## 2017-02-18 NOTE — Progress Notes (Signed)
Dr. Landry Mellow notified of Patient's current BP of 109/41 and pulse of 77. Verbal order received to hold all BP medications now and restart Metoprolol if BP is greater than 140/90. Patient made aware of new order and verbalizes an understanding.

## 2017-02-18 NOTE — Anesthesia Postprocedure Evaluation (Signed)
Anesthesia Post Note  Patient: Consulting civil engineer  Procedure(s) Performed: HYSTERECTOMY ABDOMINAL WITH SALPINGECTOMY (Bilateral Uterus)     Patient location during evaluation: [facility] Anesthesia Type: General Level of consciousness: awake and alert Pain management: pain level controlled Vital Signs Assessment: post-procedure vital signs reviewed and stable Respiratory status: spontaneous breathing, nonlabored ventilation, respiratory function stable and patient connected to nasal cannula oxygen Cardiovascular status: blood pressure returned to baseline and stable Postop Assessment: no apparent nausea or vomiting Anesthetic complications: no    Last Vitals:  Vitals:   02/18/17 0407 02/18/17 0553  BP: (!) 131/55   Pulse: 69   Resp: 20 (!) 22  Temp: 37 C   SpO2: 97% 96%    Last Pain:  Vitals:   02/18/17 0553  TempSrc:   PainSc: 0-No pain   Pain Goal: Patients Stated Pain Goal: 3 (02/17/17 1807)               Rayvon Char

## 2017-02-18 NOTE — Progress Notes (Signed)
1 Day Post-Op Procedure(s) (LRB): HYSTERECTOMY ABDOMINAL WITH SALPINGECTOMY (Bilateral)  Subjective: Patient reports tolerting po. Pain is well controlled.. No void yet.. She is abmulating .  Objective: I have reviewed patient's vital signs, intake and output and labs.  General: alert, cooperative and no distress Resp: no distress GI: soft appropriately tender nondistended + BS in all 4 quadrants .. incision PICO dressing in place and functioning  Extremities: extremities normal, atraumatic, no cyanosis or edema  Assessment: s/p Procedure(s): HYSTERECTOMY ABDOMINAL WITH SALPINGECTOMY (Bilateral): stable and progressing well  Plan: Encourage ambulation Discontinue IV fluids  Pain control-  Ibuprofen and percocet prn  Hypertension.. BP normal without meds will hold meds for now restart metoprolol if bp greater than 140/90 Hypokalemia- Kdur bid  Probable discharge home tomorrow   LOS: 1 day    Loretta Nelson J. 02/18/2017, 10:33 AM

## 2017-02-18 NOTE — Addendum Note (Signed)
Addendum  created 02/18/17 0849 by Rayvon Char, CRNA   Sign clinical note

## 2017-02-19 MED ORDER — OXYCODONE-ACETAMINOPHEN 5-325 MG PO TABS: 1.0000 | ORAL_TABLET | ORAL | 0 refills | Status: DC | PRN

## 2017-02-19 MED ORDER — IBUPROFEN 800 MG PO TABS: 800.0000 mg | ORAL_TABLET | Freq: Three times a day (TID) | ORAL | 1 refills | Status: DC | PRN

## 2017-02-19 NOTE — Progress Notes (Signed)
2 Days Post-Op Procedure(s) (LRB): HYSTERECTOMY ABDOMINAL WITH SALPINGECTOMY (Bilateral)  Subjective: Patient reports incisional pain, tolerating PO, + flatus and no problems voiding.    Objective: I have reviewed patient's vital signs, intake and output, medications and labs.  General: alert, cooperative and no distress Resp: no distress GI: soft nondistended +BS in all 4 quadrants appropriately tender. .. Incision PICO dressing in place.  Extremities: extremities normal, atraumatic, no cyanosis or edema  Assessment: s/p Procedure(s): HYSTERECTOMY ABDOMINAL WITH SALPINGECTOMY (Bilateral): stable, progressing well and tolerating diet  Plan: Encourage ambulation Discharge home  Hypertension- normal BP without anti-hypertensives.. Pt will check bp at home if greater than 140/90 restart antihypertensives.  Follow up with PCP outpatient Hypokalemia.- continue kdur  Post op visit in the office in 2 weeks   LOS: 2 days    Monigue Spraggins J. 02/19/2017, 8:27 AM

## 2017-02-19 NOTE — Progress Notes (Signed)
All discharge instructions completed with the patient. All printed discharge instructions and printed prescriptions given to the patient. Patient verbalizes an understanding of all teaching. No questions or concerns voiced at this time.

## 2017-02-19 NOTE — Discharge Summary (Signed)
Physician Discharge Summary  Patient ID: Loretta Nelson MRN: [id number] DOB/AGE: [birth year] 51 y.o.  Admit date: 02/17/2017 Discharge date: 02/19/2017  Admission Diagnoses:  Discharge Diagnoses:  Active Problems:   Fibroids, intramural   Menorrhagia   S/P hysterectomy   Discharged Condition: stable  Hospital Course: Patient was admitted after undergoing Total abdominal hysterectomy with bilateral salpingectomy on 02/17/2017 . She did well postoperatively with return of bowel and bladder function. SHe is discharged home in stable condition.   Consults: None  Significant Diagnostic Studies: labs: Potassium 3.1   Treatments: surgery: TAH with bilateral salpingectomy     Disposition: 01-Home or Self Care  Discharge Instructions    Call MD for:  persistant nausea and vomiting   Complete by:  As directed    Call MD for:  redness, tenderness, or signs of infection (pain, swelling, redness, odor or green/yellow discharge around incision site)   Complete by:  As directed    Call MD for:  severe uncontrolled pain   Complete by:  As directed    Call MD for:  temperature >100.4   Complete by:  As directed    Diet - low sodium heart healthy   Complete by:  As directed    Driving Restrictions   Complete by:  As directed    Avoid driving for 2 weeks   Increase activity slowly   Complete by:  As directed    Lifting restrictions   Complete by:  As directed    Avoid lifting over 10 lbs   May walk up steps   Complete by:  As directed    Sexual Activity Restrictions   Complete by:  As directed    Avoid sex until approved by your physician     Allergies as of 02/19/2017   No Known Allergies     Medication List    TAKE these medications   amLODipine 5 MG tablet Commonly known as:  NORVASC Take 5 mg by mouth daily.   Cholecalciferol 4000 units Caps Take 1 capsule by mouth daily.   citalopram 20 MG tablet Commonly known as:  CELEXA Take 1 tablet by mouth daily.    EDARBYCLOR 40-25 MG Tabs Generic drug:  Azilsartan-Chlorthalidone Take 1 tablet by mouth daily.   FERREX 150 150 MG capsule Generic drug:  iron polysaccharides Take 1 tablet by mouth daily.   fluticasone 50 MCG/ACT nasal spray Commonly known as:  FLONASE Place 1 spray into both nostrils daily as needed for allergies. As needed   ibuprofen 800 MG tablet Commonly known as:  ADVIL,MOTRIN Take 1 tablet (800 mg total) by mouth every 8 (eight) hours as needed (mild pain).   KLOR-CON M20 20 MEQ tablet Generic drug:  potassium chloride SA Take 20 mEq by mouth 2 (two) times daily.   loratadine 10 MG tablet Commonly known as:  CLARITIN Take 10 mg by mouth daily as needed for allergies.   metoprolol 200 MG 24 hr tablet Commonly known as:  TOPROL-XL Take 200 mg by mouth daily.   omeprazole 40 MG capsule Commonly known as:  PRILOSEC Take 40 mg by mouth daily.   oxyCODONE-acetaminophen 5-325 MG tablet Commonly known as:  PERCOCET/ROXICET Take 1-2 tablets by mouth every 4 (four) hours as needed for moderate pain ((when tolerating fluids)).   SYNTHROID 175 MCG tablet Generic drug:  levothyroxine Take 1 tablet by mouth daily before breakfast.      Follow-up Information    Christophe Louis, MD Follow up in 2 week(s).  Specialty:  Obstetrics and Gynecology Why:  patient should have a posoperative appointment scheduled in 2 weeks  Contact information: [address] [address] [phone]           Signed: Catha Brow. 02/19/2017, 8:36 AM

## 2017-09-25 ENCOUNTER — Other Ambulatory Visit: Payer: Self-pay | Admitting: Family Medicine

## 2017-09-25 DIAGNOSIS — Z1231 Encounter for screening mammogram for malignant neoplasm of breast: Secondary | ICD-10-CM

## 2017-10-19 ENCOUNTER — Ambulatory Visit: Payer: BC Managed Care – PPO

## 2017-10-20 ENCOUNTER — Ambulatory Visit
Admission: RE | Admit: 2017-10-20 | Discharge: 2017-10-20 | Disposition: A | Discharge status: Home / Self Care | Payer: BC Managed Care – PPO | Source: Ambulatory Visit | Attending: Family Medicine | Admitting: Family Medicine

## 2017-10-20 DIAGNOSIS — Z1231 Encounter for screening mammogram for malignant neoplasm of breast: Secondary | ICD-10-CM

## 2018-03-02 ENCOUNTER — Encounter: Payer: BC Managed Care – PPO | Admitting: Sports Medicine

## 2018-03-12 ENCOUNTER — Ambulatory Visit: Payer: BC Managed Care – PPO | Admitting: Sports Medicine

## 2018-03-12 ENCOUNTER — Encounter: Payer: Self-pay | Admitting: Sports Medicine

## 2018-03-12 VITALS — BP 135/70 | Ht 64.0 in | Wt 303.0 lb

## 2018-03-12 DIAGNOSIS — M19071 Primary osteoarthritis, right ankle and foot: Secondary | ICD-10-CM

## 2018-03-12 DIAGNOSIS — M2140 Flat foot [pes planus] (acquired), unspecified foot: Secondary | ICD-10-CM | POA: Insufficient documentation

## 2018-03-12 DIAGNOSIS — M2141 Flat foot [pes planus] (acquired), right foot: Secondary | ICD-10-CM

## 2018-03-12 DIAGNOSIS — M2142 Flat foot [pes planus] (acquired), left foot: Secondary | ICD-10-CM

## 2018-03-12 HISTORY — DX: Primary osteoarthritis, right ankle and foot: M19.071

## 2018-03-12 NOTE — Progress Notes (Signed)
    Subjective:  Loretta Nelson is a 53 y.o. female who presents to the [facility] today for evaluation for custom orthotics.   HPI: Patient was referred by orthopedist for pes planus and ongoing foot pain..  Patient curious to be evaluated for custom orthotics.  She has not had custom orthotics for the past.  Patient describes bilateral foot and ankle pain.  Objective:  Physical Exam: BP 135/70   Ht 5\' 4"  (1.626 m)   Wt (!) 303 lb (137.4 kg)   BMI 52.01 kg/m   Gen: NAD, resting comfortably\ Bilateral feet Inspection: Mild swelling distal anterior right foot secondary to anterior tibialis OA, gross laxity noted in medial foot with minimal arch Palpation: Mildly tender right dorsal distal foot where swelling and away is as mentioned above ROM: Normal range of motion Strength: Not assessed Stability: Stable, normal gait Special tests: N/A Vascular studies: 2+ dorsalis pedis    Assessment/Plan:  Flat foot Pes planus bleeding secondary foot and ankle pain.  Trial of green foam orthotics with additional arch support for 1 month.  If tolerated well, patient return in 1 month for custom orthotics.  Reminded patient to bring tennis shoes at that time.  Localized osteoarthrosis of left ankle and foot Patient has  Primary osteoarthritis of right foot OA located on distal, dorsal surface of foot and region where tibialis anterior runs.  Recommend arch sleeve for support.  Informed patient not to wear while sleeping.     Marny Lowenstein, MD, MS FAMILY MEDICINE RESIDENT - PGY2 03/12/2018 2:44 PM   Patient seen and evaluated with the resident.  I agree with the above plan of care.  Unfortunately, the patient did not bring her tennis shoes with her today.  We will try a green sports insole and scaphoid pad in her current shoes and she can also try these in her tennis shoes.  If she finds them to be comfortable, she will return to the office in 4 weeks with her tennis shoes so that we can make  custom orthotics.  She also has some swelling on the dorsum of her right foot, likely from midfoot DJD.  We have given her an arch strap to wear for comfort.  Follow-up with Dr. Noemi Chapel as scheduled

## 2018-03-12 NOTE — Assessment & Plan Note (Signed)
Pes planus bleeding secondary foot and ankle pain.  Trial of green foam orthotics with additional arch support for 1 month.  If tolerated well, patient return in 1 month for custom orthotics.  Reminded patient to bring tennis shoes at that time.

## 2018-03-12 NOTE — Assessment & Plan Note (Signed)
Patient has

## 2018-03-12 NOTE — Assessment & Plan Note (Signed)
OA located on distal, dorsal surface of foot and region where tibialis anterior runs.  Recommend arch sleeve for support.  Informed patient not to wear while sleeping.

## 2018-03-13 ENCOUNTER — Encounter: Payer: Self-pay | Admitting: Sports Medicine

## 2018-04-16 ENCOUNTER — Encounter: Payer: BC Managed Care – PPO | Admitting: Sports Medicine

## 2019-01-03 ENCOUNTER — Other Ambulatory Visit: Payer: Self-pay | Admitting: Family Medicine

## 2019-01-03 DIAGNOSIS — R413 Other amnesia: Secondary | ICD-10-CM

## 2019-01-15 ENCOUNTER — Other Ambulatory Visit: Payer: Self-pay

## 2019-01-15 ENCOUNTER — Ambulatory Visit (INDEPENDENT_AMBULATORY_CARE_PROVIDER_SITE_OTHER): Payer: BC Managed Care – PPO | Admitting: Neurology

## 2019-01-15 ENCOUNTER — Encounter: Payer: Self-pay | Admitting: Neurology

## 2019-01-15 VITALS — BP 145/86 | HR 71 | Temp 97.7°F | Ht 64.0 in | Wt 311.0 lb

## 2019-01-15 DIAGNOSIS — R413 Other amnesia: Secondary | ICD-10-CM | POA: Diagnosis not present

## 2019-01-15 DIAGNOSIS — G3184 Mild cognitive impairment, so stated: Secondary | ICD-10-CM

## 2019-01-15 MED ORDER — CEREFOLIN 6-1-50-5 MG PO TABS: 1.0000 | ORAL_TABLET | Freq: Every morning | ORAL | 3 refills | Status: DC

## 2019-01-15 NOTE — Patient Instructions (Signed)
I had a long discussion with the patient with regards to her subacute progressive memory and cognitive difficulties which are likely from mild cognitive impairment.  I recommend further evaluation by checking vitamin B12, methylmalonic acid and homocystine levels.  Trial of Cerefolin NAC 1 tablet daily to help with memory loss as well as depression.  Check polysomnogram for sleep apnea and referral to neuropsychology for detailed testing.  I will review MRI films and report when available from [facility] in [facility].  I also encouraged her to increase participation in cognitively challenging activities like solving crossword puzzles, bridge and sodoku.  We also discussed memory compensation strategies.  She will return for follow-up in 2 months or call earlier if necessary. Memory Compensation Strategies  1. Use "WARM" strategy.  W= write it down  A= associate it  R= repeat it  M= make a mental note  2.   You can keep a Social worker.  Use a 3-ring notebook with sections for the following: calendar, important names and phone numbers,  medications, doctors' names/phone numbers, lists/reminders, and a section to journal what you did  each day.   3.    Use a calendar to write appointments down.  4.    Write yourself a schedule for the day.  This can be placed on the calendar or in a separate section of the Memory Notebook.  Keeping a  regular schedule can help memory.  5.    Use medication organizer with sections for each day or morning/evening pills.  You may need help loading it  6.    Keep a basket, or pegboard by the door.  Place items that you need to take out with you in the basket or on the pegboard.  You may also want to  include a message board for reminders.  7.    Use sticky notes.  Place sticky notes with reminders in a place where the task is performed.  For example: " turn off the  stove" placed by the stove, "lock the door" placed on the door at eye level, " take your  medications" on  the bathroom mirror or by the place where you normally take your medications.  8.    Use alarms/timers.  Use while cooking to remind yourself to check on food or as a reminder to take your medicine, or as a  reminder to make a call, or as a reminder to perform another task, etc.

## 2019-01-15 NOTE — Progress Notes (Signed)
[facility] [address] [place]. [place] [address] [phone]       OFFICE CONSULT NOTE  [name] Date of Birth:  25-Jul-1965 Medical Record Number:  [id number]   Referring MD: Carol Ada Reason for Referral: Memory loss HPI: Loretta Nelson is a 53 year old African-American lady with past medical history of Graves' disease, vitamin D deficiency, anxiety, hypertension, gastroesophageal reflux disease, obesity, iron deficiency anemia who has noted subjective memory and cognitive difficulties for the last 1 year which seem to be progressive.  I have obtained history from the patient, reviewed referral notes and imaging films in PACS.  Patient stated that she has noticed progressive short-term memory difficulties.  She is having trouble remembering recent information as well as focusing at work with decreased concentration and leading discussions as well as meetings.  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  She denies any significant worsening headaches though she does have intermittent chronic mild headaches.  She had MRI scan of the brain done recently at [facility] in [place] but I do not have the films or the report but as per patient description she was found to have empty sella as well as some nonspecific white matter changes.  She had recent lab work done by primary physician which showed TSH of 0.65 and B12 was low normal at 190.  Patient does have longstanding history of depression and anxiety from her college days but she is on Celexa for years and states she is doing well on that.  On the geriatric depression scale she scored 4 today which is not suggestive of depression.  Patient does complain of sleepiness as well as feeling tired all the time.  She does note.  She has not had a formal evaluation for sleep apnea but may be at risk.  The patient also had a few episodes of waking up from sleep with the water coming out of  her nose which she thought was CSF rhinorrhea.  She does have some allergies but there is currently is not on treatment.  She denies any significant recent headaches.  She states she had an MRI a few years ago and was told she has a mild Arnold-Chiari malformation.  She denies significant head injury with loss of consciousness, seizures.  There is no family study of dementia.  ROS:   14 system review of systems is positive for memory loss, cognitive difficulties, decreased attention, decreased focusing, restless sleep, snoring, anxiety, depression and all other systems negative  PMH:  Past Medical History:  Diagnosis Date   Allergic rhinitis    Anemia    low iron   Anemia    Anxiety    Anxiety    Arthritis    right knee, surgery done   Complication of anesthesia    woke up during EGD   Esophageal stricture    GERD (gastroesophageal reflux disease)    Graves disease 01/13/11   radioactive iodine treatment, 123456 millicuries   Headache    tension headaches   Hiatal hernia    small   Hypertension    Hyperthyroidism    Hypothyroidism    s/p treatment   Murmur    benign, h/o, caused by hyperdynamic contraction   Obesity    Pneumonia 2017   inhaler prescribed   Vitamin D deficiency    Vitamin D deficiency     Social History:  Social History   Socioeconomic History   Marital status:  Divorced    Spouse name: Not on file   Number of children: Not on file   Years of education: Not on file   Highest education level: Not on file  Occupational History   Not on file  Social Needs   Financial resource strain: Not on file   Food insecurity    Worry: Not on file    Inability: Not on file   Transportation needs    Medical: Not on file    Non-medical: Not on file  Tobacco Use   Smoking status: Never Smoker   Smokeless tobacco: Never Used  Substance and Sexual Activity   Alcohol use: No    Alcohol/week: 0.0 standard drinks   Drug use: No    Sexual activity: Never    Birth control/protection: Abstinence  Lifestyle   Physical activity    Days per week: Not on file    Minutes per session: Not on file   Stress: Not on file  Relationships   Social connections    Talks on phone: Not on file    Gets together: Not on file    Attends religious service: Not on file    Active member of club or organization: Not on file    Attends meetings of clubs or organizations: Not on file    Relationship status: Not on file   Intimate partner violence    Fear of current or ex partner: Not on file    Emotionally abused: Not on file    Physically abused: Not on file    Forced sexual activity: Not on file  Other Topics Concern   Not on file  Social History Narrative   Not on file    Medications:   Current Outpatient Medications on File Prior to Visit  Medication Sig Dispense Refill   amLODipine (NORVASC) 5 MG tablet Take 5 mg by mouth daily.   5   Cholecalciferol 4000 UNITS CAPS Take 1 capsule by mouth daily.     citalopram (CELEXA) 20 MG tablet Take 1 tablet by mouth daily.  6   EDARBYCLOR 40-25 MG TABS Take 1 tablet by mouth daily.  5   fluticasone (FLONASE) 50 MCG/ACT nasal spray Place 1 spray into both nostrils daily as needed for allergies. As needed     ibuprofen (ADVIL,MOTRIN) 800 MG tablet Take 1 tablet (800 mg total) by mouth every 8 (eight) hours as needed (mild pain). 30 tablet 1   KLOR-CON M20 20 MEQ tablet Take 20 mEq by mouth 2 (two) times daily.   1   levothyroxine (SYNTHROID) 175 MCG tablet Take 1 tablet by mouth daily before breakfast.   5   loratadine (CLARITIN) 10 MG tablet Take 10 mg by mouth daily as needed for allergies.      metoprolol (TOPROL-XL) 200 MG 24 hr tablet Take 200 mg by mouth daily.   5   omeprazole (PRILOSEC) 40 MG capsule Take 40 mg by mouth daily.     No current facility-administered medications on file prior to visit.     Allergies:  No Known Allergies  Physical Exam General:  Obese middle-aged African-American lady seated, in no evident distress Head: head normocephalic and atraumatic.   Neck: supple with no carotid or supraclavicular bruits Cardiovascular: regular rate and rhythm, no murmurs Musculoskeletal: no deformity Skin:  no rash/petichiae Vascular:  Normal pulses all extremities  Neurologic Exam Mental Status: Awake and fully alert. Oriented to place and time. Recent and remote memory intact. Attention span, concentration and  fund of knowledge appropriate. Mood and affect appropriate.  Mini-Mental status exam she scored 30/30 without any deficits.  Animal naming 10.  Clock drawing 3/4.  Geriatric depression scale 4 not depressed Cranial Nerves: Fundoscopic exam reveals sharp disc margins. Pupils equal, briskly reactive to light. Extraocular movements full without nystagmus. Visual fields full to confrontation. Hearing intact. Facial sensation intact. Face, tongue, palate moves normally and symmetrically.  Motor: Normal bulk and tone. Normal strength in all tested extremity muscles. Sensory.: intact to touch , pinprick , position and vibratory sensation.  Coordination: Rapid alternating movements normal in all extremities. Finger-to-nose and heel-to-shin performed accurately bilaterally. Gait and Station: Arises from chair without difficulty. Stance is normal. Gait demonstrates normal stride length and balance . Able to heel, toe and tandem walk without difficulty.  Reflexes: 1+ and symmetric. Toes downgoing.      ASSESSMENT: 53 year old African-American lady with mild memory and cognitive difficulties for a year likely due to mild cognitive impairment.  She does have underlying depression as well as suspected sleep apnea which could be contributing.     PLAN: I had a long discussion with the patient with regards to her subacute progressive memory and cognitive difficulties which are likely from mild cognitive impairment.  I recommend further evaluation by  checking vitamin B12, methylmalonic acid and homocystine levels.  Trial of Cerefolin NAC 1 tablet daily to help with memory loss as well as depression.  Check polysomnogram for sleep apnea and referral to neuropsychology for detailed testing.  I will review MRI films and report [facility] available [facility].  I also encouraged her to increase participation in cognitively challenging activities like solving crossword puzzles, bridge and sodoku.  We also discussed memory compensation strategies.  Greater than 50% time during this 45-minute consultation visit was spent in counseling and coordination of care about memory loss and mild cognitive impairment and answering questions.  She will return for follow-up in 2 months or call earlier if necessary. Antony Contras, MD Note: This document was prepared with digital dictation and possible smart phrase technology. Any transcriptional errors that result from this process are unintentional.

## 2019-01-16 ENCOUNTER — Encounter: Payer: Self-pay | Admitting: Psychology

## 2019-01-21 ENCOUNTER — Ambulatory Visit (INDEPENDENT_AMBULATORY_CARE_PROVIDER_SITE_OTHER): Payer: BC Managed Care – PPO | Admitting: Neurology

## 2019-01-21 ENCOUNTER — Other Ambulatory Visit: Payer: Self-pay

## 2019-01-21 DIAGNOSIS — R413 Other amnesia: Secondary | ICD-10-CM

## 2019-01-21 DIAGNOSIS — R410 Disorientation, unspecified: Secondary | ICD-10-CM

## 2019-01-21 DIAGNOSIS — G3184 Mild cognitive impairment, so stated: Secondary | ICD-10-CM

## 2019-01-21 LAB — HOMOCYSTEINE: Homocysteine: 8.2 umol/L (ref 0.0–14.5)

## 2019-01-21 LAB — METHYLMALONIC ACID, SERUM: Methylmalonic Acid: 243 nmol/L (ref 0–378)

## 2019-01-21 LAB — VITAMIN B12: Vitamin B-12: 256 pg/mL (ref 232–1245)

## 2019-01-22 ENCOUNTER — Telehealth: Payer: Self-pay

## 2019-01-22 NOTE — Telephone Encounter (Signed)
DIsc put in [place] desk to return on 01/28/2019 when he returns to the office.

## 2019-01-23 ENCOUNTER — Other Ambulatory Visit: Payer: Self-pay | Admitting: Family Medicine

## 2019-01-23 DIAGNOSIS — Z1231 Encounter for screening mammogram for malignant neoplasm of breast: Secondary | ICD-10-CM

## 2019-01-28 ENCOUNTER — Telehealth (HOSPITAL_COMMUNITY): Payer: Self-pay | Admitting: Neurology

## 2019-01-28 NOTE — Telephone Encounter (Signed)
Garvin Fila, MD     01/28/19 10:55 AM Note   I called the patient and gave her results of her EEG done last week which was normal.  I also received MRI films on a disc to review from 01/08/2019 done at [facility] which showed empty sella and a few nonspecific white matter hyperintensities which common in patients with migraine.  Nothing to worry about.  The patient voiced understanding.

## 2019-01-28 NOTE — Telephone Encounter (Signed)
I called the patient and gave her results of her EEG done last week which was normal.  I also received MRI films on a disc to review from 01/08/2019 done at [facility] which showed empty sella and a few nonspecific white matter hyperintensities which common in patients with migraine.  Nothing to worry about.  The patient voiced understanding.

## 2019-01-31 ENCOUNTER — Other Ambulatory Visit: Payer: Self-pay

## 2019-01-31 ENCOUNTER — Ambulatory Visit: Payer: BC Managed Care – PPO | Admitting: Neurology

## 2019-01-31 ENCOUNTER — Encounter: Payer: Self-pay | Admitting: Neurology

## 2019-01-31 VITALS — BP 137/83 | HR 65 | Temp 97.5°F | Ht 64.0 in | Wt 310.2 lb

## 2019-01-31 DIAGNOSIS — R419 Unspecified symptoms and signs involving cognitive functions and awareness: Secondary | ICD-10-CM | POA: Diagnosis not present

## 2019-01-31 DIAGNOSIS — G4719 Other hypersomnia: Secondary | ICD-10-CM | POA: Diagnosis not present

## 2019-01-31 DIAGNOSIS — R0683 Snoring: Secondary | ICD-10-CM | POA: Diagnosis not present

## 2019-01-31 DIAGNOSIS — Z6841 Body Mass Index (BMI) 40.0 and over, adult: Secondary | ICD-10-CM

## 2019-01-31 NOTE — Progress Notes (Signed)
Subjective:    Patient ID: Loretta Nelson is a 53 y.o. female.  HPI     Star Age, MD, PhD [facility] [address] [place], [place] [address]  Dear Mamie Nick,  I saw your patient, Loretta Nelson, upon your kind request in my sleep clinic today for initial consultation of her sleep disorder, in particular, concern for underlying obstructive sleep apnea.  The patient is unaccompanied today.  As you know, Loretta Nelson with an underlying medical history of hypertension, vitamin D deficiency, Graves' disease with status post radioactive iodine, reflux disease, arthritis, anxiety, anemia, allergic rhinitis, hypothyroidism, and morbid obesity with a BMI of over 50, who reports snoring and excessive daytime somnolence.  She has had cognitive complaints.   I reviewed your office note from 01/15/2019.  Her Epworth sleepiness score is 15 out of 24, fatigue severity score is 41 out of 63.  She is divorced, her 79 year old twin daughters stay with her currently, they are in college remotely.  She naps almost every day.  She started Cerefolin about a week ago and feels like her energy level is a little better since then.  She works as a Therapist, sports.  Most of her meetings are virtual now.  She goes to bed around 10 and rise time is around 530.  She does not have night to night nocturia or recurrent morning headaches.  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  She has had trouble losing weight.  She is a non-smoker and does not drink alcohol and does not drink caffeine on a day-to-day basis.  She reports vivid dreams at times.  She does not wake up rested and sleep is interrupted, her daughters have commented on her snoring.  She is not aware of any family history of sleep apnea.  Her Past Medical History Is Significant For: Past Medical History:  Diagnosis Date  . Allergic rhinitis   . Anemia    low iron   . Anemia   . Anxiety   . Anxiety   . Arthritis    right knee, surgery done  . Complication of anesthesia    woke up during EGD  . Esophageal stricture   . GERD (gastroesophageal reflux disease)   . Graves disease 01/13/11   radioactive iodine treatment, 123456 millicuries  . Headache    tension headaches  . Hiatal hernia    small  . Hypertension   . Hyperthyroidism   . Hypothyroidism    s/p treatment  . Murmur    benign, h/o, caused by hyperdynamic contraction  . Obesity   . Pneumonia 2017   inhaler prescribed  . Vitamin D deficiency   . Vitamin D deficiency     Her Past Surgical History Is Significant For: Past Surgical History:  Procedure Laterality Date  . BALLOON DILATION N/A 09/07/2016   Procedure: BALLOON DILATION;  Surgeon: Arta Silence, MD;  Location: [facility] ENDOSCOPY;  Service: Endoscopy;  Laterality: N/A;  . [place]  . COLONOSCOPY    . ESOPHAGOGASTRODUODENOSCOPY    . ESOPHAGOGASTRODUODENOSCOPY (EGD) WITH PROPOFOL N/A 09/07/2016   Procedure: ESOPHAGOGASTRODUODENOSCOPY (EGD) WITH PROPOFOL;  Surgeon: Arta Silence, MD;  Location: [place];  Service: Endoscopy;  Laterality: N/A;  . HYSTERECTOMY ABDOMINAL WITH SALPINGECTOMY Bilateral 02/17/2017   Procedure: HYSTERECTOMY ABDOMINAL WITH SALPINGECTOMY;  Surgeon: Christophe Louis, MD;  Location: [facility];  Service: Gynecology;  Laterality: Bilateral;  . KNEE ARTHROSCOPY  for torn meniscus  . WISDOM TOOTH EXTRACTION    . WISDOM TOOTH EXTRACTION      Her Family History Is Significant For: Family History  Problem Relation Age of Onset  . Hypertension Mother   . CAD Maternal Grandfather   . Breast cancer Paternal Grandmother   . HIV Brother   . Heart attack Brother        sudden MI vs PE  . Breast cancer Sister   . Colon polyps Neg Hx   . Colon cancer Neg Hx   . Liver disease Neg Hx     Her Social History Is Significant For: Social History   Socioeconomic History  . Marital status: Divorced     Spouse name: Not on file  . Number of children: Not on file  . Years of education: Not on file  . Highest education level: Not on file  Occupational History  . Not on file  Social Needs  . Financial resource strain: Not on file  . Food insecurity    Worry: Not on file    Inability: Not on file  . Transportation needs    Medical: Not on file    Non-medical: Not on file  Tobacco Use  . Smoking status: Never Smoker  . Smokeless tobacco: Never Used  Substance and Sexual Activity  . Alcohol use: No    Alcohol/week: 0.0 standard drinks  . Drug use: No  . Sexual activity: Never    Birth control/protection: Abstinence  Lifestyle  . Physical activity    Days per week: Not on file    Minutes per session: Not on file  . Stress: Not on file  Relationships  . Social Herbalist on phone: Not on file    Gets together: Not on file    Attends religious service: Not on file    Active member of club or organization: Not on file    Attends meetings of clubs or organizations: Not on file    Relationship status: Not on file  Other Topics Concern  . Not on file  Social History Narrative  . Not on file    Her Allergies Are:  No Known Allergies:   Her Current Medications Are:  Outpatient Encounter Medications as of 01/31/2019  Medication Sig  . amLODipine (NORVASC) 5 MG tablet Take 5 mg by mouth daily.   . Cholecalciferol 4000 UNITS CAPS Take 1 capsule by mouth daily.  . citalopram (CELEXA) 20 MG tablet Take 1 tablet by mouth daily.  Marland Kitchen EDARBYCLOR 40-25 MG TABS Take 1 tablet by mouth daily.  . fluticasone (FLONASE) 50 MCG/ACT nasal spray Place 1 spray into both nostrils daily as needed for allergies. As needed  . ibuprofen (ADVIL,MOTRIN) 800 MG tablet Take 1 tablet (800 mg total) by mouth every 8 (eight) hours as needed (mild pain).  Marland Kitchen KLOR-CON M20 20 MEQ tablet Take 20 mEq by mouth 2 (two) times daily.   Marland Kitchen L-Methylfolate-B12-B6-B2 (CEREFOLIN) 07-29-48-5 MG TABS Take 1 tablet  by mouth every morning.  Marland Kitchen levothyroxine (SYNTHROID) 175 MCG tablet Take 1 tablet by mouth daily before breakfast.   . loratadine (CLARITIN) 10 MG tablet Take 10 mg by mouth daily as needed for allergies.   . metoprolol (TOPROL-XL) 200 MG 24 hr tablet Take 200 mg by mouth daily.   Marland Kitchen omeprazole (PRILOSEC) 40 MG capsule Take 40 mg by mouth daily.   No facility-administered encounter medications on file as of 01/31/2019.   :  Review  of Systems:  Out of a complete 14 point review of systems, all are reviewed and negative with the exception of these symptoms as listed below: Review of Systems  Neurological:       Rm 2, alone. Referred by Dr. Leonie Man. Fatigue, snoring, wakes up a lot during sleep. Vivid dreams.     Objective:  Neurological Exam  Physical Exam Physical Examination:   Vitals:   01/31/19 1606  BP: 137/83  Pulse: 65  Temp: (!) 97.5 F (36.4 C)    General Examination: The patient is a very pleasant 53 y.o. female in no acute distress. She appears well-developed and well-nourished and well groomed.   HEENT: Normocephalic, atraumatic, pupils are equal, round and reactive to light, extraocular tracking is good without limitation to gaze excursion or nystagmus noted. Hearing is grossly intact. Face is symmetric with normal facial animation. Speech is clear with no dysarthria noted. There is no hypophonia. There is no lip, neck/head, jaw or voice tremor. Neck is supple with full range of passive and active motion. There are no carotid bruits on auscultation. Oropharynx exam reveals: mild mouth dryness, good dental hygiene and mild airway crowding, due to Somewhat smaller airway entry, tonsils are 1-2+, small uvula noted, wider tongue noted, Mallampati class I.  Tongue protrudes centrally and palate elevates symmetrically.  Neck circumference is 19 inches.  Chest: Clear to auscultation without wheezing, rhonchi or crackles noted.  Heart: S1+S2+0, regular and normal without murmurs,  rubs or gallops noted.   Abdomen: Soft, non-tender and non-distended with normal bowel sounds appreciated on auscultation.  Extremities: There is no pitting edema in the distal lower extremities bilaterally.   Skin: Warm and dry without trophic changes noted.   Musculoskeletal: exam reveals no obvious joint deformities, tenderness or joint swelling or erythema.   Neurologically:  Mental status: The patient is awake, alert and oriented in all 4 spheres. Her immediate and remote memory, attention, language skills and fund of knowledge are appropriate. There is no evidence of aphasia, agnosia, apraxia or anomia. Speech is clear with normal prosody and enunciation. Thought process is linear. Mood is normal and affect is normal.  Cranial nerves II - XII are as described above under HEENT exam.  Motor exam: Normal bulk, strength and tone is noted. There is no tremor, Romberg is negative.  Fine motor skills and coordination: grossly intact.  Cerebellar testing: No dysmetria or intention tremor. There is no truncal or gait ataxia.  Sensory exam: intact to light touch in the upper and lower extremities.  Gait, station and balance: She stands easily. No veering to one side is noted. No leaning to one side is noted. Posture is age-appropriate and stance is narrow based. Gait shows normal stride length and normal pace. No problems turning are noted. Tandem walk is unremarkable.                Assessment and Plan:  In summary, Loretta Nelson is a very pleasant 53 y.o.-year old female with an underlying medical history of hypertension, vitamin D deficiency, Graves' disease with status post radioactive iodine, reflux disease, arthritis, anxiety, anemia, allergic rhinitis, hypothyroidism, and morbid obesity with a BMI of over 50, whose history and physical exam are concerning for obstructive sleep apnea (OSA). I had a long chat with the patient about my findings and the diagnosis of OSA, its prognosis and  treatment options. We talked about medical treatments, surgical interventions and non-pharmacological approaches. I explained in particular the risks and ramifications of untreated  moderate to severe OSA, especially with respect to developing cardiovascular disease down the Road, including congestive heart failure, difficult to treat hypertension, cardiac arrhythmias, or stroke. Even type 2 diabetes has, in part, been linked to untreated OSA. Symptoms of untreated OSA include daytime sleepiness, memory problems, mood irritability and mood disorder such as depression and anxiety, lack of energy, as well as recurrent headaches, especially morning headaches. We talked about trying to maintain a healthy lifestyle in general, as well as the importance of weight control. We also talked about the importance of good sleep hygiene. I recommended the following at this time: sleep study.  I explained the sleep test procedure to the patient and also outlined possible treatment options of OSA, in particular, the use of CPAP.  I answered all her questions today and the patient was in agreement. I plan to see her back after the sleep study is completed and encouraged her to call with any interim questions, concerns, problems or updates.   Thank you very much for allowing me to participate in the care of this nice patient. If I can be of any further assistance to you please do not hesitate to talk to me.  Sincerely,   Star Age, MD, PhD

## 2019-01-31 NOTE — Patient Instructions (Signed)

## 2019-03-01 DIAGNOSIS — C801 Malignant (primary) neoplasm, unspecified: Secondary | ICD-10-CM

## 2019-03-01 DIAGNOSIS — G4730 Sleep apnea, unspecified: Secondary | ICD-10-CM

## 2019-03-01 HISTORY — PX: BREAST LUMPECTOMY: SHX2

## 2019-03-01 HISTORY — DX: Malignant (primary) neoplasm, unspecified: C80.1

## 2019-03-01 HISTORY — DX: Sleep apnea, unspecified: G47.30

## 2019-03-15 ENCOUNTER — Ambulatory Visit (INDEPENDENT_AMBULATORY_CARE_PROVIDER_SITE_OTHER): Payer: BC Managed Care – PPO | Admitting: Neurology

## 2019-03-15 ENCOUNTER — Other Ambulatory Visit: Payer: Self-pay

## 2019-03-15 DIAGNOSIS — R0683 Snoring: Secondary | ICD-10-CM

## 2019-03-15 DIAGNOSIS — R419 Unspecified symptoms and signs involving cognitive functions and awareness: Secondary | ICD-10-CM

## 2019-03-15 DIAGNOSIS — Z6841 Body Mass Index (BMI) 40.0 and over, adult: Secondary | ICD-10-CM

## 2019-03-15 DIAGNOSIS — G4733 Obstructive sleep apnea (adult) (pediatric): Secondary | ICD-10-CM | POA: Diagnosis not present

## 2019-03-15 DIAGNOSIS — G4719 Other hypersomnia: Secondary | ICD-10-CM

## 2019-03-15 DIAGNOSIS — G4720 Circadian rhythm sleep disorder, unspecified type: Secondary | ICD-10-CM

## 2019-03-19 ENCOUNTER — Other Ambulatory Visit: Payer: Self-pay

## 2019-03-19 ENCOUNTER — Ambulatory Visit
Admission: RE | Admit: 2019-03-19 | Discharge: 2019-03-19 | Disposition: A | Discharge status: Home / Self Care | Payer: BC Managed Care – PPO | Source: Ambulatory Visit | Attending: Family Medicine | Admitting: Family Medicine

## 2019-03-19 DIAGNOSIS — Z1231 Encounter for screening mammogram for malignant neoplasm of breast: Secondary | ICD-10-CM

## 2019-03-20 ENCOUNTER — Other Ambulatory Visit: Payer: Self-pay | Admitting: Family Medicine

## 2019-03-20 DIAGNOSIS — R928 Other abnormal and inconclusive findings on diagnostic imaging of breast: Secondary | ICD-10-CM

## 2019-03-28 ENCOUNTER — Telehealth: Payer: Self-pay

## 2019-03-28 NOTE — Addendum Note (Signed)
Addended by: Star Age on: 03/28/2019 08:03 AM   Modules accepted: Orders

## 2019-03-28 NOTE — Telephone Encounter (Signed)
-----   Message from Star Age, MD sent at 03/28/2019  8:03 AM EST ----- Patient referred by Dr. Leonie Man, seen by me on 01/31/19, diagnostic PSG on 03/15/19.   Please call and notify the patient that the recent sleep study showed severe obstructive sleep apnea. I recommend treatment for this in the form of CPAP. This will require a repeat sleep study for proper titration and mask fitting and correct monitoring of the oxygen saturations. Please explain to patient. I have placed an order in the chart. Thanks.  Star Age, MD, PhD [facility])

## 2019-03-28 NOTE — Telephone Encounter (Signed)
I reached out to the pt and advised of results. She verbalized understanding and was agreeable to scheduling cpap titration study.  Pt advised she would be receiving a call from the sleep lab to schedule.

## 2019-03-28 NOTE — Procedures (Signed)
PATIENT'S NAME:  Loretta Nelson, Loretta Nelson DOB:      [birth year]      MR#:    [id number]     DATE OF RECORDING: 03/15/2019 REFERRING M.D.:  [name], MD Study Performed:   Baseline Polysomnogram HISTORY: 54 year old woman with a history of hypertension, vitamin D deficiency, Graves' disease with status post radioactive iodine, reflux disease, arthritis, anxiety, anemia, allergic rhinitis, hypothyroidism, and morbid obesity with a BMI of over 50, who reports snoring and excessive daytime somnolence.  She has had cognitive complaints. The patient endorsed the Epworth Sleepiness Scale at 15 points. The patient's weight 310 pounds with a height of 64 (inches), resulting in a BMI of 53.1 kg/m2. The patient's neck circumference measured 19 inches.  CURRENT MEDICATIONS: Norvasc, Vit D, Celexa, Edarbyclor, Flonase, Advil, Klor-Con, B-12, Synthroid, Claritin,Toprol-XL, Prilosec   PROCEDURE:  This is a multichannel digital polysomnogram utilizing the Somnostar 11.2 system.  Electrodes and sensors were applied and monitored per AASM Specifications.   EEG, EOG, Chin and Limb EMG, were sampled at 200 Hz.  ECG, Snore and Nasal Pressure, Thermal Airflow, Respiratory Effort, CPAP Flow and Pressure, Oximetry was sampled at 50 Hz. Digital video and audio were recorded.      BASELINE STUDY  Lights Out was at 22:26 and Lights On at 04:57.  Total recording time (TRT) was 391 minutes, with a total sleep time (TST) of 351 minutes.   The patient's sleep latency was 18.5 minutes.  REM latency was 143 minutes, which is delayed. The sleep efficiency was 89.8 %.     SLEEP ARCHITECTURE: WASO (Wake after sleep onset) was 19.5 minutes with mild sleep fragmentation noted. There were 23 minutes in Stage N1, 244.5 minutes Stage N2, 69.5 minutes Stage N3 and 14 minutes in Stage REM.  The percentage of Stage N1 was 6.6%, Stage N2 was 69.7%, which is increased, Stage N3 was 19.8%, which is normal, and Stage R (REM sleep) was 4.%, which is  markedly reduced. The arousals were noted as: 45 were spontaneous, 0 were associated with PLMs, 147 were associated with respiratory events.  RESPIRATORY ANALYSIS:  There were a total of 336 respiratory events:  4 obstructive apneas, 2 central apneas and 0 mixed apneas with a total of 6 apneas and an apnea index (AI) of 1. /hour. There were 330 hypopneas with a hypopnea index of 56.4 /hour. The patient also had 0 respiratory event related arousals (RERAs).      The total APNEA/HYPOPNEA INDEX (AHI) was 57.4/hour and the total RESPIRATORY DISTURBANCE INDEX was  57.4 /hour.  23 events occurred in REM sleep and 624 events in NREM. The REM AHI was  98.6 /hour, versus a non-REM AHI of 55.7. The patient spent 206 minutes of total sleep time in the supine position and 145 minutes in non-supine.. The supine AHI was 53.0 versus a non-supine AHI of 63.7.  OXYGEN SATURATION & C02:  The Wake baseline 02 saturation was 94%, with the lowest being 73%. Time spent below 89% saturation equaled 278 minutes.  PERIODIC LIMB MOVEMENTS: The patient had a total of 0 Periodic Limb Movements.  The Periodic Limb Movement (PLM) index was 0 and the PLM Arousal index was 0/hour.  Audio and video analysis did not show any abnormal or unusual movements, behaviors, phonations or vocalizations. The patient took no bathroom breaks. Moderate snoring was noted. The EKG was in keeping with normal sinus rhythm (NSR).  Post-study, the patient indicated that sleep was the same as usual.   IMPRESSION:  1. Severe Obstructive Sleep Apnea (OSA) 2. Dysfunctions associated with sleep stages or arousal from sleep  RECOMMENDATIONS:  1. This study demonstrates severe obstructive sleep apnea, with a total AHI of 57.4/hour, REM AHI of 98.6/hour, supine AHI of 53/hour and O2 nadir of 73%. Treatment with positive airway pressure in the form of CPAP is recommended. This will require a full night titration study to optimize therapy. Other treatment  options may include avoidance of supine sleep position along with weight loss, upper airway or jaw surgery in selected patients or the use of an oral appliance in certain patients. ENT evaluation and/or consultation with a maxillofacial surgeon or dentist may be feasible in some instances.    2. Please note that untreated obstructive sleep apnea may carry additional perioperative morbidity. Patients with significant obstructive sleep apnea should receive perioperative PAP therapy and the surgeons and particularly the anesthesiologist should be informed of the diagnosis and the severity of the sleep disordered breathing. 3. This study shows sleep fragmentation and abnormal sleep stage percentages; these are nonspecific findings and per se do not signify an intrinsic sleep disorder or a cause for the patient's sleep-related symptoms. Causes include (but are not limited to) the first night effect of the sleep study, circadian rhythm disturbances, medication effect or an underlying mood disorder or medical problem.  4. The patient should be cautioned not to drive, work at heights, or operate dangerous or heavy equipment when tired or sleepy. Review and reiteration of good sleep hygiene measures should be pursued with any patient. 5. The patient will be seen in follow-up in the sleep clinic at [facility] for discussion of the test results, symptom and treatment compliance review, further management strategies, etc. The referring provider will be notified of the test results.  I certify that I have reviewed the entire raw data recording prior to the issuance of this report in accordance with the Standards of Accreditation of the American Academy of Sleep Medicine (AASM)  Star Age, MD, PhD Diplomat, American Board of Neurology and Sleep Medicine (Neurology and Sleep Medicine)

## 2019-03-28 NOTE — Progress Notes (Signed)
Patient referred by Dr. Leonie Man, seen by me on 01/31/19, diagnostic PSG on 03/15/19.   Please call and notify the patient that the recent sleep study showed severe obstructive sleep apnea. I recommend treatment for this in the form of CPAP. This will require a repeat sleep study for proper titration and mask fitting and correct monitoring of the oxygen saturations. Please explain to patient. I have placed an order in the chart. Thanks.  Star Age, MD, PhD [facility])

## 2019-03-29 ENCOUNTER — Other Ambulatory Visit: Payer: Self-pay

## 2019-03-29 ENCOUNTER — Ambulatory Visit
Admission: RE | Admit: 2019-03-29 | Discharge: 2019-03-29 | Disposition: A | Discharge status: Home / Self Care | Payer: BC Managed Care – PPO | Source: Ambulatory Visit | Attending: Family Medicine | Admitting: Family Medicine

## 2019-03-29 ENCOUNTER — Other Ambulatory Visit: Payer: Self-pay | Admitting: Family Medicine

## 2019-03-29 DIAGNOSIS — N6320 Unspecified lump in the left breast, unspecified quadrant: Secondary | ICD-10-CM

## 2019-03-29 DIAGNOSIS — R928 Other abnormal and inconclusive findings on diagnostic imaging of breast: Secondary | ICD-10-CM

## 2019-04-03 ENCOUNTER — Ambulatory Visit
Admission: RE | Admit: 2019-04-03 | Discharge: 2019-04-03 | Disposition: A | Discharge status: Home / Self Care | Payer: BC Managed Care – PPO | Source: Ambulatory Visit | Attending: Family Medicine | Admitting: Family Medicine

## 2019-04-03 ENCOUNTER — Other Ambulatory Visit: Payer: Self-pay

## 2019-04-03 DIAGNOSIS — N6320 Unspecified lump in the left breast, unspecified quadrant: Secondary | ICD-10-CM

## 2019-04-05 ENCOUNTER — Other Ambulatory Visit: Payer: Self-pay | Admitting: *Deleted

## 2019-04-05 ENCOUNTER — Encounter: Payer: Self-pay | Admitting: Neurology

## 2019-04-05 ENCOUNTER — Telehealth: Payer: Self-pay | Admitting: Oncology

## 2019-04-05 ENCOUNTER — Encounter: Payer: Self-pay | Admitting: *Deleted

## 2019-04-05 DIAGNOSIS — C50412 Malignant neoplasm of upper-outer quadrant of left female breast: Secondary | ICD-10-CM

## 2019-04-05 DIAGNOSIS — Z170 Estrogen receptor positive status [ER+]: Secondary | ICD-10-CM

## 2019-04-05 HISTORY — DX: Malignant neoplasm of upper-outer quadrant of left female breast: C50.412

## 2019-04-05 HISTORY — DX: Estrogen receptor positive status (ER+): Z17.0

## 2019-04-05 NOTE — Telephone Encounter (Signed)
Spoke to patient to confirm afternoon [facility] appointment for 2/10, packet emailed to patient

## 2019-04-08 NOTE — Progress Notes (Signed)
[place]  [phone] [phone]     ID: Nadia Viar DOB: 1965/11/19  MR#: [id number]  GEZ#:[id number]  Patient Care Team: [name], MD as PCP - General (Family Medicine) Mauro Kaufmann, RN as Oncology Nurse Navigator Rockwell Germany, RN as Oncology Nurse Navigator Donnie Mesa, MD as Consulting Physician (General Surgery) Jaxiel Kines, Virgie Dad, MD as Consulting Physician (Oncology) Kyung Rudd, MD as Consulting Physician (Radiation Oncology) Delrae Rend, MD as Consulting Physician (Endocrinology) Star Age, MD as Consulting Physician (Neurology) Garvin Fila, MD as Consulting Physician (Neurology) Christophe Louis, MD as Consulting Physician (Obstetrics and Gynecology) Chauncey Cruel, MD OTHER MD:  CHIEF COMPLAINT: 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 She underwent left diagnostic mammography with tomography and left breast ultrasonography at [facility] on 03/29/2019 showing: breast density category B; 8 mm mass in left breast at 12 o'clock; left axilla negative for lymphadenopathy.  Accordingly on 04/03/2019 she proceeded to biopsy of the left breast area in question. The pathology from this procedure ([id number]) showed: invasive mammary carcinoma, grade 3, e-cadherin positive. Prognostic indicators significant for: estrogen receptor, 10% positive with weak staining intensity and progesterone receptor, 0% negative. Proliferation marker Ki67 at 30%. HER2 negative by immunohistochemistry (1+).  The patient's subsequent history is as detailed below.   INTERVAL HISTORY: Venicia was evaluated in the multidisciplinary breast cancer clinic on 04/10/2019.Marland Kitchen Her case was also presented at the multidisciplinary breast cancer conference on the  same day. At that time a preliminary plan was proposed: Breast conserving surgery, Oncotype, with very likely chemotherapy needed; adjuvant radiation, and genetics   REVIEW OF SYSTEMS: Mykaylah reports fatigue, pain in her knees and shoulder described as stabbing and throbbing, difficulty swallowing, heart murmur, shortness of breath with climbing stairs, occasional mild nausea, joint pain and arthritis, cognitive dysfunction for which she is seeing neurology, anxiety, and thyroid issues. There were no specific symptoms leading to the original mammogram, which was routinely scheduled. The patient denies unusual headaches, visual changes, nausea, vomiting, stiff neck, dizziness, or gait imbalance. There has been no cough, phlegm production, or pleurisy, no chest pain or pressure, and no change in bowel or bladder habits. The patient denies fever, rash, or bleeding.  A detailed review of systems was otherwise noncontributory   PAST MEDICAL HISTORY: Past Medical History:  Diagnosis Date  . Allergic rhinitis   . Anemia    low iron  . Anemia   . Anxiety   . Anxiety   . Arthritis    right knee, surgery done  . Complication of anesthesia    woke up during EGD  . Esophageal stricture   . Family history of breast cancer   . GERD (gastroesophageal reflux disease)   . Graves disease 01/13/11   radioactive iodine treatment, 81.2 millicuries  . Headache    tension headaches  . Hiatal hernia    small  . Hypertension   . Hyperthyroidism   . Hypothyroidism    s/p treatment  . Murmur    benign, h/o, caused by hyperdynamic contraction  . Obesity   . Pneumonia 2017   inhaler prescribed  . Vitamin D deficiency   . Vitamin D deficiency     PAST SURGICAL HISTORY: Past Surgical History:  Procedure Laterality Date  . BALLOON DILATION N/A 09/07/2016   Procedure: BALLOON DILATION;  Surgeon: Arta Silence, MD;  Location: [facility] ENDOSCOPY;  Service: Endoscopy;  Laterality: N/A;  . [place]  . COLONOSCOPY    . ESOPHAGOGASTRODUODENOSCOPY    . ESOPHAGOGASTRODUODENOSCOPY (EGD) WITH PROPOFOL N/A 09/07/2016   Procedure: ESOPHAGOGASTRODUODENOSCOPY (EGD) WITH PROPOFOL;  Surgeon: Arta Silence, MD;  Location: [facility];  Service: Endoscopy;  Laterality: N/A;  . HYSTERECTOMY ABDOMINAL WITH SALPINGECTOMY Bilateral 02/17/2017   Procedure: HYSTERECTOMY ABDOMINAL WITH SALPINGECTOMY;  Surgeon: Christophe Louis, MD;  Location: [facility];  Service: Gynecology;  Laterality: Bilateral;  . KNEE ARTHROSCOPY     for torn meniscus  . WISDOM TOOTH EXTRACTION    . WISDOM TOOTH EXTRACTION      FAMILY HISTORY: Family History  Problem Relation Age of Onset  . Hypertension Mother   . CAD Maternal Grandfather   . Breast cancer Paternal Grandmother   . HIV Brother   . Heart attack Brother        sudden MI vs PE  . Breast cancer Sister 90  . Breast cancer Other        one dx 39s, others dx in 78s  . Colon polyps Neg Hx   . Colon cancer Neg Hx   . Liver disease Neg Hx    Patient's father was in his late 24s when he died from causes unknown to the patient.  Patient's mother is 13 years old (as of 04/2019). The patient denies a family hx of ovarian cancer. She reports breast cancer in her sister at age 90, in her paternal grandmother, and in 3 paternal cousins. She had 4 siblings, 2 brothers and 2 sisters, though only her sisters are still living.   GYNECOLOGIC HISTORY:  Patient's last menstrual period was 02/13/2017 (exact date). Menarche: 54 years old Age at first live birth: 54 years old [place] P 2 (twins) LMP 01/2017 Contraceptive: used from 8119-1478 without complication HRT never used  Hysterectomy? Yes, 01/2017, benign pathology BSO? 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 She is divorced. She lives at home with her twin daughters, Maryjo Rochester and Edmon Crape, who are 54 years old and  attending college remotely. Maryjo Rochester is studying music and communications. Mekensei is studying IT.    ADVANCED DIRECTIVES: Not in place; the patient intends to name her sister Madelon Lips as her HCPOA [phone])   HEALTH MAINTENANCE: Social History   Tobacco Use  . Smoking status: Never Smoker  . Smokeless tobacco: Never Used  Substance Use Topics  . Alcohol use: No    Alcohol/week: 0.0 standard drinks  . Drug use: No     Colonoscopy: at age 62  PAP: 02/2018  Bone density: never done   No Known Allergies  Current Outpatient Medications  Medication Sig Dispense Refill  . amLODipine (NORVASC) 5 MG tablet Take 5 mg by mouth daily.   5  . Cholecalciferol 4000 UNITS CAPS Take 1 capsule by mouth daily.    . citalopram (CELEXA) 20 MG tablet Take 1 tablet by mouth daily.  6  . EDARBYCLOR 40-25 MG TABS Take 1 tablet by mouth daily.  5  . KLOR-CON M20 20 MEQ tablet Take 20 mEq by mouth 2 (two) times daily.   1  . L-Methylfolate-B12-B6-B2 (CEREFOLIN) 07-29-48-5 MG TABS Take 1 tablet by mouth every morning. 90 tablet 3  . levothyroxine (SYNTHROID) 175 MCG tablet Take 1 tablet by mouth daily before breakfast.   5  .  metoprolol (TOPROL-XL) 200 MG 24 hr tablet Take 200 mg by mouth daily.   5  . omeprazole (PRILOSEC) 40 MG capsule Take 40 mg by mouth daily.    . fluticasone (FLONASE) 50 MCG/ACT nasal spray Place 1 spray into both nostrils daily as needed for allergies. As needed    . ibuprofen (ADVIL,MOTRIN) 800 MG tablet Take 1 tablet (800 mg total) by mouth every 8 (eight) hours as needed (mild pain). (Patient not taking: Reported on 04/10/2019) 30 tablet 1  . loratadine (CLARITIN) 10 MG tablet Take 10 mg by mouth daily as needed for allergies.     Marland Kitchen spironolactone (ALDACTONE) 50 MG tablet Take 50 mg by mouth daily.     No current facility-administered medications for this visit.    OBJECTIVE: Morbidly obese African-American woman in no acute distress  Vitals:   04/10/19 1300    BP: (!) 170/85  Pulse: 72  Resp: 20  Temp: 98.2 F (36.8 C)  SpO2: 96%     Body mass index is 52.82 kg/m.   Wt Readings from Last 3 Encounters:  04/10/19 (!) 307 lb 11.2 oz (139.6 kg)  01/31/19 (!) 310 lb 3.2 oz (140.7 kg)  01/15/19 (!) 311 lb (141.1 kg)      ECOG FS:1 - Symptomatic but completely ambulatory  Ocular: Sclerae unicteric, pupils round and equal Ear-nose-throat: Wearing a mask Lymphatic: No cervical or supraclavicular adenopathy Lungs no rales or rhonchi Heart regular rate and rhythm Abd soft, nontender, positive bowel sounds MSK no focal spinal tenderness, no upper extremity edema Neuro: non-focal, well-oriented, appropriate affect Breasts: The right breast is unremarkable.  The left breast is status post recent biopsy, with a Steri-Strip in place.  Both axillae are benign.   LAB RESULTS:  CMP     Component Value Date/Time   NA 144 04/10/2019 1240   K 2.9 (LL) 04/10/2019 1240   CL 102 04/10/2019 1240   CO2 32 04/10/2019 1240   GLUCOSE 107 (H) 04/10/2019 1240   BUN 12 04/10/2019 1240   CREATININE 0.83 04/10/2019 1240   CALCIUM 9.0 04/10/2019 1240   PROT 7.4 04/10/2019 1240   ALBUMIN 3.8 04/10/2019 1240   AST 17 04/10/2019 1240   ALT 19 04/10/2019 1240   ALKPHOS 64 04/10/2019 1240   BILITOT 0.4 04/10/2019 1240   GFRNONAA >60 04/10/2019 1240   GFRAA >60 04/10/2019 1240    No results found for: TOTALPROTELP, ALBUMINELP, A1GS, A2GS, BETS, BETA2SER, GAMS, MSPIKE, SPEI  Lab Results  Component Value Date   WBC 5.8 04/10/2019   NEUTROABS 3.1 04/10/2019   HGB 12.1 04/10/2019   HCT 38.5 04/10/2019   MCV 84.8 04/10/2019   PLT 217 04/10/2019    No results found for: LABCA2  No components found for: GLOVFI433  No results for input(s): INR in the last 168 hours.  No results found for: LABCA2  No results found for: IRJ188  No results found for: CZY606  No results found for: TKZ601  No results found for: CA2729  No components found for:  HGQUANT  No results found for: CEA1 / No results found for: CEA1   No results found for: AFPTUMOR  No results found for: CHROMOGRNA  No results found for: KPAFRELGTCHN, LAMBDASER, KAPLAMBRATIO (kappa/lambda light chains)  No results found for: HGBA, HGBA2QUANT, HGBFQUANT, HGBSQUAN (Hemoglobinopathy evaluation)   No results found for: LDH  No results found for: IRON, TIBC, IRONPCTSAT (Iron and TIBC)  No results found for: FERRITIN  Urinalysis No results found for:  COLORURINE, APPEARANCEUR, LABSPEC, PHURINE, GLUCOSEU, HGBUR, BILIRUBINUR, KETONESUR, PROTEINUR, UROBILINOGEN, NITRITE, LEUKOCYTESUR   STUDIES: Split night study  Result Date: 03/15/2019 Star Age, MD     03/28/2019  8:01 AM PATIENT'S NAME:  Lenox Ponds DOB:      [birth year]     MR#:    [id number]    DATE OF RECORDING: 03/15/2019 REFERRING M.D.:  [name], MD Study Performed:   Baseline Polysomnogram HISTORY: 54 year old woman with a history of hypertension, vitamin D deficiency, Graves' disease with status post radioactive iodine, reflux disease, arthritis, anxiety, anemia, allergic rhinitis, hypothyroidism, and morbid obesity with a BMI of over 50, who reports snoring and excessive daytime somnolence.  She has had cognitive complaints. The patient endorsed the Epworth Sleepiness Scale at 15 points. The patient's weight 310 pounds with a height of 64 (inches), resulting in a BMI of 53.1 kg/m2. The patient's neck circumference measured 19 inches. CURRENT MEDICATIONS: Norvasc, Vit D, Celexa, Edarbyclor, Flonase, Advil, Klor-Con, B-12, Synthroid, Claritin,Toprol-XL, Prilosec  PROCEDURE:  This is a multichannel digital polysomnogram utilizing the Somnostar 11.2 system.  Electrodes and sensors were applied and monitored per AASM Specifications.   EEG, EOG, Chin and Limb EMG, were sampled at 200 Hz.  ECG, Snore and Nasal Pressure, Thermal Airflow, Respiratory Effort, CPAP Flow and Pressure, Oximetry was sampled at 50 Hz. Digital  video and audio were recorded.    BASELINE STUDY Lights Out was at 22:26 and Lights On at 04:57.  Total recording time (TRT) was 391 minutes, with a total sleep time (TST) of 351 minutes.   The patient's sleep latency was 18.5 minutes.  REM latency was 143 minutes, which is delayed. The sleep efficiency was 89.8 %.   SLEEP ARCHITECTURE: WASO (Wake after sleep onset) was 19.5 minutes with mild sleep fragmentation noted. There were 23 minutes in Stage N1, 244.5 minutes Stage N2, 69.5 minutes Stage N3 and 14 minutes in Stage REM.  The percentage of Stage N1 was 6.6%, Stage N2 was 69.7%, which is increased, Stage N3 was 19.8%, which is normal, and Stage R (REM sleep) was 4.%, which is markedly reduced. The arousals were noted as: 45 were spontaneous, 0 were associated with PLMs, 147 were associated with respiratory events. RESPIRATORY ANALYSIS:  There were a total of 336 respiratory events:  4 obstructive apneas, 2 central apneas and 0 mixed apneas with a total of 6 apneas and an apnea index (AI) of 1. /hour. There were 330 hypopneas with a hypopnea index of 56.4 /hour. The patient also had 0 respiratory event related arousals (RERAs).    The total APNEA/HYPOPNEA INDEX (AHI) was 57.4/hour and the total RESPIRATORY DISTURBANCE INDEX was  57.4 /hour.  23 events occurred in REM sleep and 624 events in NREM. The REM AHI was  98.6 /hour, versus a non-REM AHI of 55.7. The patient spent 206 minutes of total sleep time in the supine position and 145 minutes in non-supine.. The supine AHI was 53.0 versus a non-supine AHI of 63.7. OXYGEN SATURATION & C02:  The Wake baseline 02 saturation was 94%, with the lowest being 73%. Time spent below 89% saturation equaled 278 minutes. PERIODIC LIMB MOVEMENTS: The patient had a total of 0 Periodic Limb Movements.  The Periodic Limb Movement (PLM) index was 0 and the PLM Arousal index was 0/hour. Audio and video analysis did not show any abnormal or unusual movements, behaviors, phonations  or vocalizations. The patient took no bathroom breaks. Moderate snoring was noted. The EKG was in keeping with normal  sinus rhythm (NSR). Post-study, the patient indicated that sleep was the same as usual. IMPRESSION: 1. Severe Obstructive Sleep Apnea (OSA) 2. Dysfunctions associated with sleep stages or arousal from sleep RECOMMENDATIONS: 1. This study demonstrates severe obstructive sleep apnea, with a total AHI of 57.4/hour, REM AHI of 98.6/hour, supine AHI of 53/hour and O2 nadir of 73%. Treatment with positive airway pressure in the form of CPAP is recommended. This will require a full night titration study to optimize therapy. Other treatment options may include avoidance of supine sleep position along with weight loss, upper airway or jaw surgery in selected patients or the use of an oral appliance in certain patients. ENT evaluation and/or consultation with a maxillofacial surgeon or dentist may be feasible in some instances.   2. Please note that untreated obstructive sleep apnea may carry additional perioperative morbidity. Patients with significant obstructive sleep apnea should receive perioperative PAP therapy and the surgeons and particularly the anesthesiologist should be informed of the diagnosis and the severity of the sleep disordered breathing. 3. This study shows sleep fragmentation and abnormal sleep stage percentages; these are nonspecific findings and per se do not signify an intrinsic sleep disorder or a cause for the patient's sleep-related symptoms. Causes include (but are not limited to) the first night effect of the sleep study, circadian rhythm disturbances, medication effect or an underlying mood disorder or medical problem. 4. The patient should be cautioned not to drive, work at heights, or operate dangerous or heavy equipment when tired or sleepy. Review and reiteration of good sleep hygiene measures should be pursued with any patient. 5. The patient will be seen in follow-up in the  sleep clinic at [facility] for discussion of the test results, symptom and treatment compliance review, further management strategies, etc. The referring provider will be notified of the test results. I certify that I have reviewed the entire raw data recording prior to the issuance of this report in accordance with the Standards of Accreditation of the [place] [employer] (AASM) Star Age, MD, PhD Diplomat, American Board of Neurology and Sleep Medicine (Neurology and Sleep Medicine)  US BREAST LTD UNI LEFT INC AXILLA  Result Date: 03/29/2019 CLINICAL DATA:  54 year old patient recalled from recent screening mammogram for evaluation a left breast mass. The patient's sister was diagnosed with breast cancer at age 70. EXAM: DIGITAL DIAGNOSTIC LEFT MAMMOGRAM WITH CAD AND TOMO ULTRASOUND LEFT BREAST COMPARISON:  Previous exam(s). ACR Breast Density Category b: There are scattered areas of fibroglandular density. FINDINGS: Spot compression views with tomography confirm an irregular mass in the 12 o'clock axis of the left breast, posterior third. The mass measures approximately 8 mm. Mammographic images were processed with CAD. Targeted ultrasound is performed, showing a hypoechoic irregular mass in the deep aspect of the breast parenchyma at 12 o'clock position 10 cm from nipple. The mass measures 8 x 5 x 7 mm. Ultrasound the left axilla is negative for lymphadenopathy. IMPRESSION: Suspicious 8 mm mass 12 o'clock position left breast 10 cm from the nipple. RECOMMENDATION: Ultrasound-guided core needle biopsy is recommended and will be scheduled for the patient. I have discussed the findings and recommendations with the patient. If applicable, a reminder letter will be sent to the patient regarding the next appointment. BI-RADS CATEGORY  5: Highly suggestive of malignancy. Electronically Signed   By: Curlene Dolphin M.D.   On: 03/29/2019 14:34   MM DIAG BREAST TOMO UNI LEFT  Result Date:  03/29/2019 CLINICAL DATA:  54 year old patient recalled  from recent screening mammogram for evaluation a left breast mass. The patient's sister was diagnosed with breast cancer at age 57. EXAM: DIGITAL DIAGNOSTIC LEFT MAMMOGRAM WITH CAD AND TOMO ULTRASOUND LEFT BREAST COMPARISON:  Previous exam(s). ACR Breast Density Category b: There are scattered areas of fibroglandular density. FINDINGS: Spot compression views with tomography confirm an irregular mass in the 12 o'clock axis of the left breast, posterior third. The mass measures approximately 8 mm. Mammographic images were processed with CAD. Targeted ultrasound is performed, showing a hypoechoic irregular mass in the deep aspect of the breast parenchyma at 12 o'clock position 10 cm from nipple. The mass measures 8 x 5 x 7 mm. Ultrasound the left axilla is negative for lymphadenopathy. IMPRESSION: Suspicious 8 mm mass 12 o'clock position left breast 10 cm from the nipple. RECOMMENDATION: Ultrasound-guided core needle biopsy is recommended and will be scheduled for the patient. I have discussed the findings and recommendations with the patient. If applicable, a reminder letter will be sent to the patient regarding the next appointment. BI-RADS CATEGORY  5: Highly suggestive of malignancy. Electronically Signed   By: Curlene Dolphin M.D.   On: 03/29/2019 14:34   MM 3D SCREEN BREAST BILATERAL  Result Date: 03/20/2019 CLINICAL DATA:  Screening. EXAM: DIGITAL SCREENING BILATERAL MAMMOGRAM WITH TOMO AND CAD COMPARISON:  Previous exam(s). ACR Breast Density Category b: There are scattered areas of fibroglandular density. FINDINGS: In the left breast, a possible mass warrants further evaluation. In the right breast, no findings suspicious for malignancy. Images were processed with CAD. IMPRESSION: Further evaluation is suggested for possible mass in the left breast. RECOMMENDATION: Diagnostic mammogram and possibly ultrasound of the left breast. (Code:[id number]) The  patient will be contacted regarding the findings, and additional imaging will be scheduled. BI-RADS CATEGORY  0: Incomplete. Need additional imaging evaluation and/or prior mammograms for comparison. Electronically Signed   By: Curlene Dolphin M.D.   On: 03/20/2019 12:42   MM CLIP PLACEMENT LEFT  Result Date: 04/03/2019 CLINICAL DATA:  Status post ultrasound-guided biopsy of an 8 mm mass in the 12 o'clock position of the left breast. EXAM: DIAGNOSTIC LEFT MAMMOGRAM POST ULTRASOUND BIOPSY COMPARISON:  Previous exam(s). FINDINGS: Mammographic images were obtained following ultrasound guided biopsy of the recently demonstrated 8 mm mass in the 12 o'clock position of the left breast. The biopsy marking clip is in expected position at the site of biopsy. IMPRESSION: Appropriate positioning of the ribbon shaped biopsy marking clip at the site of biopsy in the biopsied mass in the 12 o'clock position of the left breast. Final Assessment: Post Procedure Mammograms for Marker Placement Electronically Signed   By: Claudie Revering M.D.   On: 04/03/2019 08:22   [place] LT BREAST BX W LOC DEV 1ST LESION IMG BX SPEC US GUIDE  Addendum Date: 04/04/2019   ADDENDUM REPORT: 04/04/2019 13:23 ADDENDUM: Pathology revealed GRADE III INVASIVE MAMMARY CARCINOMA of the Left breast, 12 o'clock. This was found to be concordant by Dr. Claudie Revering. Pathology results were discussed with the patient by telephone. The patient reported doing well after the biopsy with tenderness at the site. Post biopsy instructions and care were reviewed and questions were answered. The patient was encouraged to call [facility] for any additional concerns. The patient was referred to [facility] on April 10, 2019. Pathology results reported by Terie Purser, RN on 04/04/2019. Electronically Signed   By: Percell Locus.D.  On: 04/04/2019 13:23   Result Date:  04/04/2019 CLINICAL DATA:  8 mm mass in the 12 o'clock position of the left breast at recent mammography and ultrasound. EXAM: ULTRASOUND GUIDED LEFT BREAST CORE NEEDLE BIOPSY COMPARISON:  Previous exam(s). FINDINGS: I met with the patient and we discussed the procedure of ultrasound-guided biopsy, including benefits and alternatives. We discussed the high likelihood of a successful procedure. We discussed the risks of the procedure, including infection, bleeding, tissue injury, clip migration, and inadequate sampling. Informed written consent was given. The usual time-out protocol was performed immediately prior to the procedure. Using sterile technique and 1% Lidocaine as local anesthetic, under direct ultrasound visualization, a 12 gauge spring-loaded device was used to perform biopsy of the recently demonstrated 8 mm mass in the 12 o'clock position of the left breast using a caudal approach. At the conclusion of the procedure a ribbon shaped tissue marker clip was deployed into the biopsy cavity. Follow up 2 view mammogram was performed and dictated separately. IMPRESSION: Ultrasound guided biopsy of the recently demonstrated 8 mm mass in the 12 o'clock position of the left breast. No apparent complications. Electronically Signed: By: Claudie Revering M.D. On: 04/03/2019 08:11     ELIGIBLE FOR AVAILABLE RESEARCH PROTOCOL:BR003?--Depending on final surgical pathology  ASSESSMENT: 54 y.o. [place] woman status post left breast upper outer quadrant biopsy 04/03/2019 for a clinical T1b N0, stage IB invasive ductal carcinoma, grade 3, functionally triple negative (estrogen receptor is weakly positive at 10%, progesterone receptor negative, HER-2 not amplified), with an MIB-1 of 30%  (1) definitive surgery pending.  (2) Oncotype to be obtained from the original biopsy.  Chemotherapy is anticipated  (3) adjuvant chemotherapy likely will consist of cyclophosphamide and docetaxel every 21 days x 4  (4)  adjuvant radiation to follow  (5) genetics testing  PLAN: I met today with Izora to review her new diagnosis. Specifically we discussed the biology of her breast cancer, its diagnosis, staging, treatment  options and prognosis. We first reviewed the fact that cancer is not one disease but more than 100 different diseases and that it is important to keep them separate-- otherwise when friends and relatives discuss their own cancer experiences with Aut[place] confusion can result. Similarly we explained that if breast cancer spreads to the bone or liver, the patient would not have bone cancer or liver cancer, but breast cancer in the bone and breast cancer in the liver: one cancer in three places-- not 3 different cancers which otherwise would have to be treated in 3 different ways.  We discussed the difference between local and systemic therapy. In terms of loco-regional treatment, lumpectomy plus radiation is equivalent to mastectomy as far as survival is concerned. For this reason, and because the cosmetic results are generally superior, we recommend breast conserving surgery.   We then discussed the rationale for systemic therapy. There is some risk that this cancer may have already spread to other parts of her body. Patients frequently ask at this point about bone scans, CAT scans and PET scans to find out if they have occult breast cancer somewhere else. The problem is that in early stage disease we are much more likely to find false positives then true cancers and this would expose the patient to unnecessary procedures as well as unnecessary radiation. Scans cannot answer the question the patient really would like to know, which is whether she has microscopic disease elsewhere in her body. For those reasons we do not recommend them.  Of  course we would proceed to aggressive evaluation of any symptoms that might suggest metastatic disease, but that is not the case here.  Next we went over the options  for systemic therapy which are anti-estrogens, anti-HER-2 immunotherapy, and chemotherapy. Callista does not meet criteria for anti-HER-2 immunotherapy. She is a poor candidate for anti-estrogens.  I would be uncomfortable if antiestrogens were only systemic treatment.  Accordingly we discussed chemotherapy in detail today. She knows I anticipate her Oncotype to be high risk.  I specifically propose cyclophosphamide and docetaxel every 21 days x 4.  She is aware of the possible toxicities side effects and complications of these agents including questions regarding neuropathy and the rare possibility of permanent alopecia.  We did discuss the cold cap.  At this point she is not interested in pursuing that option.  She does qualify for genetics. In patients who carry a deleterious mutation [for example in a  BRCA gene], the risk of a new breast cancer developing in the future may be sufficiently great that the patient may choose bilateral mastectomies. However if she wishes to keep her breasts in that situation it is safe to do so. That would require intensified screening, which generally means not only yearly mammography but a yearly breast MRI as well.  Sure the plan at this point is to start with Oncotype, likely proceed to definitive surgery with port placement, then chemotherapy, then adjuvant radiation.  Thedora has a good understanding of the overall plan. She agrees with it. She knows the goal of treatment in her case is cure. She will call with any problems that may develop before her next visit here.  Total encounter time 65 minutes.Chauncey Cruel, MD   04/10/2019 5:13 PM Medical Oncology and Hematology [facility] [place], [place] [address] Tel. [phone]    Fax. [phone]   This document serves as a record of services personally performed by Lurline Del, MD. It was created on his behalf by Wilburn Mylar, a trained medical scribe. The  creation of this record is based on the scribe's personal observations and the provider's statements to them.   I, Lurline Del MD, have reviewed the above documentation for accuracy and completeness, and I agree with the above.   *Total Encounter Time as defined by the Centers for Medicare and Medicaid Services includes, in addition to the face-to-face time of a patient visit (documented in the note above) non-face-to-face time: obtaining and reviewing outside history, ordering and reviewing medications, tests or procedures, care coordination (communications with other health care professionals or caregivers) and documentation in the medical record.

## 2019-04-09 ENCOUNTER — Other Ambulatory Visit: Payer: BC Managed Care – PPO

## 2019-04-10 ENCOUNTER — Ambulatory Visit
Admission: RE | Admit: 2019-04-10 | Discharge: 2019-04-10 | Disposition: A | Discharge status: Home / Self Care | Payer: BC Managed Care – PPO | Source: Ambulatory Visit | Attending: Radiation Oncology | Admitting: Radiation Oncology

## 2019-04-10 ENCOUNTER — Other Ambulatory Visit: Payer: Self-pay

## 2019-04-10 ENCOUNTER — Telehealth: Payer: Self-pay | Admitting: *Deleted

## 2019-04-10 ENCOUNTER — Ambulatory Visit: Payer: BC Managed Care – PPO | Attending: Surgery | Admitting: Physical Therapy

## 2019-04-10 ENCOUNTER — Ambulatory Visit: Payer: Self-pay | Admitting: Surgery

## 2019-04-10 ENCOUNTER — Ambulatory Visit (HOSPITAL_BASED_OUTPATIENT_CLINIC_OR_DEPARTMENT_OTHER): Payer: BC Managed Care – PPO | Admitting: Licensed Clinical Social Worker

## 2019-04-10 ENCOUNTER — Encounter: Payer: Self-pay | Admitting: Licensed Clinical Social Worker

## 2019-04-10 ENCOUNTER — Encounter: Payer: Self-pay | Admitting: Physical Therapy

## 2019-04-10 ENCOUNTER — Inpatient Hospital Stay: Payer: BC Managed Care – PPO | Attending: Oncology | Admitting: Oncology

## 2019-04-10 ENCOUNTER — Inpatient Hospital Stay: Payer: BC Managed Care – PPO

## 2019-04-10 VITALS — BP 170/85 | HR 72 | Temp 98.2°F | Resp 20 | Ht 64.0 in | Wt 307.7 lb

## 2019-04-10 DIAGNOSIS — C50412 Malignant neoplasm of upper-outer quadrant of left female breast: Secondary | ICD-10-CM

## 2019-04-10 DIAGNOSIS — Z170 Estrogen receptor positive status [ER+]: Secondary | ICD-10-CM | POA: Insufficient documentation

## 2019-04-10 DIAGNOSIS — M25511 Pain in right shoulder: Secondary | ICD-10-CM | POA: Diagnosis present

## 2019-04-10 DIAGNOSIS — M25611 Stiffness of right shoulder, not elsewhere classified: Secondary | ICD-10-CM | POA: Diagnosis present

## 2019-04-10 DIAGNOSIS — Z90710 Acquired absence of both cervix and uterus: Secondary | ICD-10-CM

## 2019-04-10 DIAGNOSIS — C50812 Malignant neoplasm of overlapping sites of left female breast: Secondary | ICD-10-CM | POA: Diagnosis present

## 2019-04-10 DIAGNOSIS — Z803 Family history of malignant neoplasm of breast: Secondary | ICD-10-CM | POA: Insufficient documentation

## 2019-04-10 DIAGNOSIS — G8929 Other chronic pain: Secondary | ICD-10-CM | POA: Diagnosis present

## 2019-04-10 DIAGNOSIS — R293 Abnormal posture: Secondary | ICD-10-CM | POA: Diagnosis present

## 2019-04-10 DIAGNOSIS — C50912 Malignant neoplasm of unspecified site of left female breast: Secondary | ICD-10-CM

## 2019-04-10 LAB — CBC WITH DIFFERENTIAL (CANCER CENTER ONLY)
Abs Immature Granulocytes: 0.01 10*3/uL (ref 0.00–0.07)
Basophils Absolute: 0 10*3/uL (ref 0.0–0.1)
Basophils Relative: 0 %
Eosinophils Absolute: 0.2 10*3/uL (ref 0.0–0.5)
Eosinophils Relative: 4 %
HCT: 38.5 % (ref 36.0–46.0)
Hemoglobin: 12.1 g/dL (ref 12.0–15.0)
Immature Granulocytes: 0 %
Lymphocytes Relative: 36 %
Lymphs Abs: 2.1 10*3/uL (ref 0.7–4.0)
MCH: 26.7 pg (ref 26.0–34.0)
MCHC: 31.4 g/dL (ref 30.0–36.0)
MCV: 84.8 fL (ref 80.0–100.0)
Monocytes Absolute: 0.4 10*3/uL (ref 0.1–1.0)
Monocytes Relative: 6 %
Neutro Abs: 3.1 10*3/uL (ref 1.7–7.7)
Neutrophils Relative %: 54 %
Platelet Count: 217 10*3/uL (ref 150–400)
RBC: 4.54 MIL/uL (ref 3.87–5.11)
RDW: 13.8 % (ref 11.5–15.5)
WBC Count: 5.8 10*3/uL (ref 4.0–10.5)
nRBC: 0 % (ref 0.0–0.2)

## 2019-04-10 LAB — CMP (CANCER CENTER ONLY)
ALT: 19 U/L (ref 0–44)
AST: 17 U/L (ref 15–41)
Albumin: 3.8 g/dL (ref 3.5–5.0)
Alkaline Phosphatase: 64 U/L (ref 38–126)
Anion gap: 10 (ref 5–15)
BUN: 12 mg/dL (ref 6–20)
CO2: 32 mmol/L (ref 22–32)
Calcium: 9 mg/dL (ref 8.9–10.3)
Chloride: 102 mmol/L (ref 98–111)
Creatinine: 0.83 mg/dL (ref 0.44–1.00)
GFR, Est AFR Am: >60 mL/min (ref >60)
GFR, Estimated: >60 mL/min (ref >60)
Glucose, Bld: 107 mg/dL — ABNORMAL HIGH (ref 70–99)
Potassium: 2.9 mmol/L — CL (ref 3.5–5.1)
Sodium: 144 mmol/L (ref 135–145)
Total Bilirubin: 0.4 mg/dL (ref 0.3–1.2)
Total Protein: 7.4 g/dL (ref 6.5–8.1)

## 2019-04-10 LAB — GENETIC SCREENING ORDER

## 2019-04-10 NOTE — Progress Notes (Signed)
START OFF PATHWAY REGIMEN - Breast   [id number]:Docetaxel + Cyclophosphamide (TC):   A cycle is every 21 days:     Docetaxel      Cyclophosphamide   **Always confirm dose/schedule in your pharmacy ordering system**  Patient Characteristics: Postoperative without Neoadjuvant Therapy (Pathologic Staging), Invasive Disease, Adjuvant Therapy, HER2 Negative/Unknown/Equivocal, ER Negative/Unknown, Node Negative, pT1a-b, N0, Chemotherapy Indicated Therapeutic Status: Postoperative without Neoadjuvant Therapy (Pathologic Staging) AJCC Grade: G3 AJCC N Category: pN0 AJCC M Category: cM0 ER Status: Negative (-) AJCC 8 Stage Grouping: IB HER2 Status: Negative (-) Oncotype Dx Recurrence Score: Not Appropriate AJCC T Category: pT1b PR Status: Negative (-) Intervention Indicated: Chemotherapy Intent of Therapy: Curative Intent, Discussed with Patient

## 2019-04-10 NOTE — Progress Notes (Signed)
Radiation Oncology         ([phone]) [phone] ________________________________  Name: Loretta Nelson        MRN: [id number]  Date of Service: 04/10/2019 DOB: 1966/02/08  CC:[name], MD  Donnie Mesa, MD     REFERRING PHYSICIAN: Donnie Mesa, MD   DIAGNOSIS: The encounter diagnosis was Malignant neoplasm of upper-outer quadrant of left breast in female, estrogen receptor positive ([place]).   HISTORY OF PRESENT ILLNESS: Loretta Nelson is a 54 y.o. female seen in the multidisciplinary breast clinic for a new diagnosis of left breast cancer. The patient was noted to have a screening detected mass in the left breast.  Diagnostic imaging revealed a mass in the upper inner quadrant of the left breast at 12:00 measuring 8 x 7 x 5 mm.  Her axilla was negative for adenopathy.  On 04/03/2019, she underwent a biopsy revealing a grade 3 invasive ductal carcinoma.  Her tumor was weakly positive for estrogen receptors, PR and HER-2 negative with a Ki-67 of 30%.  She is seen today to discuss recommendations of treatment for her cancer.    PREVIOUS RADIATION THERAPY: No   PAST MEDICAL HISTORY:  Past Medical History:  Diagnosis Date   Allergic rhinitis    Anemia    low iron   Anemia    Anxiety    Anxiety    Arthritis    right knee, surgery done   Complication of anesthesia    woke up during EGD   Esophageal stricture    GERD (gastroesophageal reflux disease)    Graves disease 01/13/11   radioactive iodine treatment, 36.6 millicuries   Headache    tension headaches   Hiatal hernia    small   Hypertension    Hyperthyroidism    Hypothyroidism    s/p treatment   Murmur    benign, h/o, caused by hyperdynamic contraction   Obesity    Pneumonia 2017   inhaler prescribed   Vitamin D deficiency    Vitamin D deficiency        PAST SURGICAL HISTORY: Past Surgical History:  Procedure Laterality Date   BALLOON DILATION N/A 09/07/2016   Procedure: BALLOON DILATION;   Surgeon: Arta Silence, MD;  Location: [facility];  Service: Endoscopy;  Laterality: N/A;   CESAREAN SECTION  1999   COLONOSCOPY     ESOPHAGOGASTRODUODENOSCOPY     ESOPHAGOGASTRODUODENOSCOPY (EGD) WITH PROPOFOL N/A 09/07/2016   Procedure: ESOPHAGOGASTRODUODENOSCOPY (EGD) WITH PROPOFOL;  Surgeon: Arta Silence, MD;  Location: [facility];  Service: Endoscopy;  Laterality: N/A;   HYSTERECTOMY ABDOMINAL WITH SALPINGECTOMY Bilateral 02/17/2017   Procedure: HYSTERECTOMY ABDOMINAL WITH SALPINGECTOMY;  Surgeon: Christophe Louis, MD;  Location: [facility];  Service: Gynecology;  Laterality: Bilateral;   KNEE ARTHROSCOPY     for torn meniscus   WISDOM TOOTH EXTRACTION     WISDOM TOOTH EXTRACTION       FAMILY HISTORY:  Family History  Problem Relation Age of Onset   Hypertension Mother    CAD Maternal Grandfather    Breast cancer Paternal Grandmother    HIV Brother    Heart attack Brother        sudden MI vs PE   Breast cancer Sister 68   Colon polyps Neg Hx    Colon cancer Neg Hx    Liver disease Neg Hx      SOCIAL HISTORY:  reports that she has never smoked. She has never used smokeless tobacco. She reports that she does not drink alcohol or  use drugs.  The patient is divorced.  She lives in [place].  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 She has twin daughters in college.    ALLERGIES: Patient has no known allergies.   MEDICATIONS:  Current Outpatient Medications  Medication Sig Dispense Refill   amLODipine (NORVASC) 5 MG tablet Take 5 mg by mouth daily.   5   Cholecalciferol 4000 UNITS CAPS Take 1 capsule by mouth daily.     citalopram (CELEXA) 20 MG tablet Take 1 tablet by mouth daily.  6   EDARBYCLOR 40-25 MG TABS Take 1 tablet by mouth daily.  5   fluticasone (FLONASE) 50 MCG/ACT nasal spray Place 1 spray into both nostrils daily as needed for allergies. As needed     ibuprofen (ADVIL,MOTRIN) 800  MG tablet Take 1 tablet (800 mg total) by mouth every 8 (eight) hours as needed (mild pain). 30 tablet 1   KLOR-CON M20 20 MEQ tablet Take 20 mEq by mouth 2 (two) times daily.   1   L-Methylfolate-B12-B6-B2 (CEREFOLIN) 07-29-48-5 MG TABS Take 1 tablet by mouth every morning. 90 tablet 3   levothyroxine (SYNTHROID) 175 MCG tablet Take 1 tablet by mouth daily before breakfast.   5   loratadine (CLARITIN) 10 MG tablet Take 10 mg by mouth daily as needed for allergies.      metoprolol (TOPROL-XL) 200 MG 24 hr tablet Take 200 mg by mouth daily.   5   omeprazole (PRILOSEC) 40 MG capsule Take 40 mg by mouth daily.     No current facility-administered medications for this encounter.     REVIEW OF SYSTEMS: On review of systems, the patient reports that she is doing okay. She reports her best friend whom she's known since childhood passed away this week from metastatic breast cancer. This is weighing heavily on her with her own diagnosis. Physically she's had some trouble with severe fatigue from sleep apnea and is also being worked up for neurologic deficits related to cognition as a result of anoxic time from her apnea. She denies any chest pain, shortness of breath, cough, fevers, chills, night sweats, unintended weight changes. She denies any bowel or bladder disturbances, and denies abdominal pain, nausea or vomiting. She denies any new musculoskeletal or joint aches or pains. A complete review of systems is obtained and is otherwise negative.     PHYSICAL EXAM:  Wt Readings from Last 3 Encounters:  01/31/19 (!) 310 lb 3.2 oz (140.7 kg)  01/15/19 (!) 311 lb (141.1 kg)  03/12/18 (!) 303 lb (137.4 kg)   Temp Readings from Last 3 Encounters:  01/31/19 (!) 97.5 F (36.4 C)  01/15/19 97.7 F (36.5 C)  02/19/17 98.5 F (36.9 C) (Oral)   BP Readings from Last 3 Encounters:  01/31/19 137/83  01/15/19 (!) 145/86  03/12/18 135/70   Pulse Readings from Last 3 Encounters:  01/31/19 65    01/15/19 71  02/19/17 71    In general this is a well appearing obese African-American female in no acute distress.  She's alert and oriented x4 and appropriate throughout the examination. Cardiopulmonary assessment is negative for acute distress and she exhibits normal effort.  Bilateral breast exam is deferred   ECOG = 0  0 - Asymptomatic (Fully active, able to carry on all predisease activities without restriction)  1 - Symptomatic but completely ambulatory (Restricted in physically strenuous activity but ambulatory and able to carry out work of a light or  sedentary nature. For example, light housework, office work)  2 - Symptomatic, <50% in bed during the day (Ambulatory and capable of all self care but unable to carry out any work activities. Up and about more than 50% of waking hours)  3 - Symptomatic, >50% in bed, but not bedbound (Capable of only limited self-care, confined to bed or chair 50% or more of waking hours)  4 - Bedbound (Completely disabled. Cannot carry on any self-care. Totally confined to bed or chair)  5 - Death   Eustace Pen MM, Creech RH, Tormey DC, et al. [phone]). "Toxicity and response criteria of the [facility]". [place] Oncol. 5 (6): 649-55    LABORATORY DATA:  Lab Results  Component Value Date   WBC 11.4 (H) 02/18/2017   HGB 9.6 (L) 02/18/2017   HCT 30.0 (L) 02/18/2017   MCV 81.7 02/18/2017   PLT 223 02/18/2017   Lab Results  Component Value Date   NA 134 (L) 02/18/2017   K 3.1 (L) 02/18/2017   CL 98 (L) 02/18/2017   CO2 28 02/18/2017   No results found for: ALT, AST, GGT, ALKPHOS, BILITOT    RADIOGRAPHY: Split night study  Result Date: 03/15/2019 Star Age, MD     03/28/2019  8:01 AM PATIENT'S NAME:  Lenox Ponds DOB:      1965/05/28     MR#:    [id number]    DATE OF RECORDING: 03/15/2019 REFERRING M.D.:  [name], MD Study Performed:   Baseline Polysomnogram HISTORY: 54 year old woman with a history of  hypertension, vitamin D deficiency, Graves' disease with status post radioactive iodine, reflux disease, arthritis, anxiety, anemia, allergic rhinitis, hypothyroidism, and morbid obesity with a BMI of over 50, who reports snoring and excessive daytime somnolence.  She has had cognitive complaints. The patient endorsed the Epworth Sleepiness Scale at 15 points. The patient's weight 310 pounds with a height of 64 (inches), resulting in a BMI of 53.1 kg/m2. The patient's neck circumference measured 19 inches. CURRENT MEDICATIONS: Norvasc, Vit D, Celexa, Edarbyclor, Flonase, Advil, Klor-Con, B-12, Synthroid, Claritin,Toprol-XL, Prilosec  PROCEDURE:  This is a multichannel digital polysomnogram utilizing the Somnostar 11.2 system.  Electrodes and sensors were applied and monitored per AASM Specifications.   EEG, EOG, Chin and Limb EMG, were sampled at 200 Hz.  ECG, Snore and Nasal Pressure, Thermal Airflow, Respiratory Effort, CPAP Flow and Pressure, Oximetry was sampled at 50 Hz. Digital video and audio were recorded.    BASELINE STUDY Lights Out was at 22:26 and Lights On at 04:57.  Total recording time (TRT) was 391 minutes, with a total sleep time (TST) of 351 minutes.   The patient's sleep latency was 18.5 minutes.  REM latency was 143 minutes, which is delayed. The sleep efficiency was 89.8 %.   SLEEP ARCHITECTURE: WASO (Wake after sleep onset) was 19.5 minutes with mild sleep fragmentation noted. There were 23 minutes in Stage N1, 244.5 minutes Stage N2, 69.5 minutes Stage N3 and 14 minutes in Stage REM.  The percentage of Stage N1 was 6.6%, Stage N2 was 69.7%, which is increased, Stage N3 was 19.8%, which is normal, and Stage R (REM sleep) was 4.%, which is markedly reduced. The arousals were noted as: 45 were spontaneous, 0 were associated with PLMs, 147 were associated with respiratory events. RESPIRATORY ANALYSIS:  There were a total of 336 respiratory events:  4 obstructive apneas, 2 central apneas and 0  mixed apneas with a total of 6 apneas and an  apnea index (AI) of 1. /hour. There were 330 hypopneas with a hypopnea index of 56.4 /hour. The patient also had 0 respiratory event related arousals (RERAs).    The total APNEA/HYPOPNEA INDEX (AHI) was 57.4/hour and the total RESPIRATORY DISTURBANCE INDEX was  57.4 /hour.  23 events occurred in REM sleep and 624 events in NREM. The REM AHI was  98.6 /hour, versus a non-REM AHI of 55.7. The patient spent 206 minutes of total sleep time in the supine position and 145 minutes in non-supine.. The supine AHI was 53.0 versus a non-supine AHI of 63.7. OXYGEN SATURATION & C02:  The Wake baseline 02 saturation was 94%, with the lowest being 73%. Time spent below 89% saturation equaled 278 minutes. PERIODIC LIMB MOVEMENTS: The patient had a total of 0 Periodic Limb Movements.  The Periodic Limb Movement (PLM) index was 0 and the PLM Arousal index was 0/hour. Audio and video analysis did not show any abnormal or unusual movements, behaviors, phonations or vocalizations. The patient took no bathroom breaks. Moderate snoring was noted. The EKG was in keeping with normal sinus rhythm (NSR). Post-study, the patient indicated that sleep was the same as usual. IMPRESSION: 1. Severe Obstructive Sleep Apnea (OSA) 2. Dysfunctions associated with sleep stages or arousal from sleep RECOMMENDATIONS: 1. This study demonstrates severe obstructive sleep apnea, with a total AHI of 57.4/hour, REM AHI of 98.6/hour, supine AHI of 53/hour and O2 nadir of 73%. Treatment with positive airway pressure in the form of CPAP is recommended. This will require a full night titration study to optimize therapy. Other treatment options may include avoidance of supine sleep position along with weight loss, upper airway or jaw surgery in selected patients or the use of an oral appliance in certain patients. ENT evaluation and/or consultation with a maxillofacial surgeon or dentist may be feasible in some instances.    2. Please note that untreated obstructive sleep apnea may carry additional perioperative morbidity. Patients with significant obstructive sleep apnea should receive perioperative PAP therapy and the surgeons and particularly the anesthesiologist should be informed of the diagnosis and the severity of the sleep disordered breathing. 3. This study shows sleep fragmentation and abnormal sleep stage percentages; these are nonspecific findings and per se do not signify an intrinsic sleep disorder or a cause for the patient's sleep-related symptoms. Causes include (but are not limited to) the first night effect of the sleep study, circadian rhythm disturbances, medication effect or an underlying mood disorder or medical problem. 4. The patient should be cautioned not to drive, work at heights, or operate dangerous or heavy equipment when tired or sleepy. Review and reiteration of good sleep hygiene measures should be pursued with any patient. 5. The patient will be seen in follow-up in the sleep clinic at [facility] for discussion of the test results, symptom and treatment compliance review, further management strategies, etc. The referring provider will be notified of the test results. I certify that I have reviewed the entire raw data recording prior to the issuance of this report in accordance with the Standards of Accreditation of the [place] [employer] (AASM) Star Age, MD, PhD Diplomat, American Board of Neurology and Sleep Medicine (Neurology and Sleep Medicine)  US BREAST LTD UNI LEFT INC AXILLA  Result Date: 03/29/2019 CLINICAL DATA:  54 year old patient recalled from recent screening mammogram for evaluation a left breast mass. The patient's sister was diagnosed with breast cancer at age 35. EXAM: DIGITAL DIAGNOSTIC LEFT MAMMOGRAM WITH CAD AND TOMO ULTRASOUND  LEFT BREAST COMPARISON:  Previous exam(s). ACR Breast Density Category b: There are scattered areas of fibroglandular density. FINDINGS:  Spot compression views with tomography confirm an irregular mass in the 12 o'clock axis of the left breast, posterior third. The mass measures approximately 8 mm. Mammographic images were processed with CAD. Targeted ultrasound is performed, showing a hypoechoic irregular mass in the deep aspect of the breast parenchyma at 12 o'clock position 10 cm from nipple. The mass measures 8 x 5 x 7 mm. Ultrasound the left axilla is negative for lymphadenopathy. IMPRESSION: Suspicious 8 mm mass 12 o'clock position left breast 10 cm from the nipple. RECOMMENDATION: Ultrasound-guided core needle biopsy is recommended and will be scheduled for the patient. I have discussed the findings and recommendations with the patient. If applicable, a reminder letter will be sent to the patient regarding the next appointment. BI-RADS CATEGORY  5: Highly suggestive of malignancy. Electronically Signed   By: Curlene Dolphin M.D.   On: 03/29/2019 14:34   MM DIAG BREAST TOMO UNI LEFT  Result Date: 03/29/2019 CLINICAL DATA:  54 year old patient recalled from recent screening mammogram for evaluation a left breast mass. The patient's sister was diagnosed with breast cancer at age 56. EXAM: DIGITAL DIAGNOSTIC LEFT MAMMOGRAM WITH CAD AND TOMO ULTRASOUND LEFT BREAST COMPARISON:  Previous exam(s). ACR Breast Density Category b: There are scattered areas of fibroglandular density. FINDINGS: Spot compression views with tomography confirm an irregular mass in the 12 o'clock axis of the left breast, posterior third. The mass measures approximately 8 mm. Mammographic images were processed with CAD. Targeted ultrasound is performed, showing a hypoechoic irregular mass in the deep aspect of the breast parenchyma at 12 o'clock position 10 cm from nipple. The mass measures 8 x 5 x 7 mm. Ultrasound the left axilla is negative for lymphadenopathy. IMPRESSION: Suspicious 8 mm mass 12 o'clock position left breast 10 cm from the nipple. RECOMMENDATION:  Ultrasound-guided core needle biopsy is recommended and will be scheduled for the patient. I have discussed the findings and recommendations with the patient. If applicable, a reminder letter will be sent to the patient regarding the next appointment. BI-RADS CATEGORY  5: Highly suggestive of malignancy. Electronically Signed   By: Curlene Dolphin M.D.   On: 03/29/2019 14:34   MM 3D SCREEN BREAST BILATERAL  Result Date: 03/20/2019 CLINICAL DATA:  Screening. EXAM: DIGITAL SCREENING BILATERAL MAMMOGRAM WITH TOMO AND CAD COMPARISON:  Previous exam(s). ACR Breast Density Category b: There are scattered areas of fibroglandular density. FINDINGS: In the left breast, a possible mass warrants further evaluation. In the right breast, no findings suspicious for malignancy. Images were processed with CAD. IMPRESSION: Further evaluation is suggested for possible mass in the left breast. RECOMMENDATION: Diagnostic mammogram and possibly ultrasound of the left breast. (Code:[id number]) The patient will be contacted regarding the findings, and additional imaging will be scheduled. BI-RADS CATEGORY  0: Incomplete. Need additional imaging evaluation and/or prior mammograms for comparison. Electronically Signed   By: Curlene Dolphin M.D.   On: 03/20/2019 12:42   MM CLIP PLACEMENT LEFT  Result Date: 04/03/2019 CLINICAL DATA:  Status post ultrasound-guided biopsy of an 8 mm mass in the 12 o'clock position of the left breast. EXAM: DIAGNOSTIC LEFT MAMMOGRAM POST ULTRASOUND BIOPSY COMPARISON:  Previous exam(s). FINDINGS: Mammographic images were obtained following ultrasound guided biopsy of the recently demonstrated 8 mm mass in the 12 o'clock position of the left breast. The biopsy marking clip is in expected position at the site of biopsy. IMPRESSION:  Appropriate positioning of the ribbon shaped biopsy marking clip at the site of biopsy in the biopsied mass in the 12 o'clock position of the left breast. Final Assessment: Post  Procedure Mammograms for Marker Placement Electronically Signed   By: Claudie Revering M.D.   On: 04/03/2019 08:22   [place] LT BREAST BX W LOC DEV 1ST LESION IMG BX SPEC US GUIDE  Addendum Date: 04/04/2019   ADDENDUM REPORT: 04/04/2019 13:23 ADDENDUM: Pathology revealed GRADE III INVASIVE MAMMARY CARCINOMA of the Left breast, 12 o'clock. This was found to be concordant by Dr. Claudie Revering. Pathology results were discussed with the patient by telephone. The patient reported doing well after the biopsy with tenderness at the site. Post biopsy instructions and care were reviewed and questions were answered. The patient was encouraged to call [facility] for any additional concerns. The patient was referred to [facility] on April 10, 2019. Pathology results reported by Terie Purser, RN on 04/04/2019. Electronically Signed   By: Claudie Revering M.D.   On: 04/04/2019 13:23   Result Date: 04/04/2019 CLINICAL DATA:  8 mm mass in the 12 o'clock position of the left breast at recent mammography and ultrasound. EXAM: ULTRASOUND GUIDED LEFT BREAST CORE NEEDLE BIOPSY COMPARISON:  Previous exam(s). FINDINGS: I met with the patient and we discussed the procedure of ultrasound-guided biopsy, including benefits and alternatives. We discussed the high likelihood of a successful procedure. We discussed the risks of the procedure, including infection, bleeding, tissue injury, clip migration, and inadequate sampling. Informed written consent was given. The usual time-out protocol was performed immediately prior to the procedure. Using sterile technique and 1% Lidocaine as local anesthetic, under direct ultrasound visualization, a 12 gauge spring-loaded device was used to perform biopsy of the recently demonstrated 8 mm mass in the 12 o'clock position of the left breast using a caudal approach. At the conclusion of the procedure a ribbon  shaped tissue marker clip was deployed into the biopsy cavity. Follow up 2 view mammogram was performed and dictated separately. IMPRESSION: Ultrasound guided biopsy of the recently demonstrated 8 mm mass in the 12 o'clock position of the left breast. No apparent complications. Electronically Signed: By: Claudie Revering M.D. On: 04/03/2019 08:11       IMPRESSION/PLAN: 1. Stage IB, cT1bN0M0 grade 3, ER weakly positive, ER/HER2 negative (medical oncology classifies as triple negative) invasive ductal carcinoma of the left breast. Dr. Lisbeth Renshaw discusses the pathology findings and reviews the nature of invasive left breast disease.  Dr. Jana Hakim recommends proceeding with a Oncotype Dx score on her biopsy specimen to determine a role for systemic therapy. She would then be a candidate for breast conservation with lumpectomy with sentinel node biopsy and depending on Oncotype Dx results, she may need a PAC placed for systemic therapy.  Following systemic therapy, she would benefit from external radiotherapy to the breast. We discussed the risks, benefits, short, and long term effects of radiotherapy, and the patient is interested in proceeding. Dr. Lisbeth Renshaw discusses the delivery and logistics of radiotherapy and anticipates a course of 6 1/2 weeks of radiotherapy. We will see her back about 2 weeks after completion of systemic therapy to discuss the simulation process and anticipate we starting radiotherapy about 4-6 weeks thereafter.  2. Possible genetic predisposition to malignancy. The patient is a candidate for genetic testing given her personal and family history. She was offered referral and will meet with the  genetics team today. 3. Sleep Apnea and neurocognitive deficits. The patient was encouraged to continue her work up and treatment of this issue with neurology. We will follow this expectantly.    In a visit lasting 60 minutes, greater than 50% of the time was spent face to face, and in preparation,  discussion, and coordinating the patient's care.  The above documentation reflects my direct findings during this shared patient visit. Please see the separate note by Dr. Lisbeth Renshaw on this date for the remainder of the patient's plan of care.    Carola Rhine, PAC

## 2019-04-10 NOTE — Telephone Encounter (Signed)
Received order for oncotype testing on core bx. Requisition faxed to pathology and [facility] 

## 2019-04-10 NOTE — Patient Instructions (Signed)

## 2019-04-10 NOTE — Progress Notes (Signed)
REFERRING PROVIDER: Chauncey Cruel, MD [address] [place],  [place] [address]  PRIMARY PROVIDER:  [name], MD  PRIMARY REASON FOR VISIT:  1. Malignant neoplasm of upper-outer quadrant of left breast in female, estrogen receptor positive ([place])   2. Family history of breast cancer    I connected with Loretta Nelson on 04/10/2019 at 3:35 PM EDT by Webex and verified that I am speaking with the correct person using two identifiers.    Patient location: [facility] Provider location: office   HISTORY OF PRESENT ILLNESS:   Loretta Nelson, a 54 y.o. female, was seen for a [facility] cancer genetics consultation at the request of Dr. Jana Hakim due her recent diagnosis of breast cancer and family history of breast cancer.  Loretta Nelson presents to clinic today to discuss the possibility of a hereditary predisposition to cancer, genetic testing, and to further clarify her future cancer risks, as well as potential cancer risks for family members.   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 The treatment plan includes lumpectomy, Oncotype Dx to determine a role for chemotherapy, possible radiation.    CANCER HISTORY:  Oncology History   No history exists.     RISK FACTORS:  Menarche was at age 3.  First live birth at age 68.  OCP use for approximately 5 years.  Ovaries intact: yes.  Hysterectomy: yes.  Menopausal status: postmenopausal.  HRT use: 0 years. Colonoscopy: yes; normal. Mammogram within the last year: yes. Number of breast biopsies: 1. Up to date with pelvic exams: yes. Any excessive radiation exposure in the past: no  Past Medical History:  Diagnosis Date  . Allergic rhinitis   . Anemia    low iron  . Anemia   . Anxiety   . Anxiety   . Arthritis    right knee, surgery done  . Complication of anesthesia    woke up during EGD  . Esophageal stricture   . Family  history of breast cancer   . GERD (gastroesophageal reflux disease)   . Graves disease 01/13/11   radioactive iodine treatment, 81.8 millicuries  . Headache    tension headaches  . Hiatal hernia    small  . Hypertension   . Hyperthyroidism   . Hypothyroidism    s/p treatment  . Murmur    benign, h/o, caused by hyperdynamic contraction  . Obesity   . Pneumonia 2017   inhaler prescribed  . Vitamin D deficiency   . Vitamin D deficiency     Past Surgical History:  Procedure Laterality Date  . BALLOON DILATION N/A 09/07/2016   Procedure: BALLOON DILATION;  Surgeon: Arta Silence, MD;  Location: [facility] ENDOSCOPY;  Service: Endoscopy;  Laterality: N/A;  . [place]  . COLONOSCOPY    . ESOPHAGOGASTRODUODENOSCOPY    . ESOPHAGOGASTRODUODENOSCOPY (EGD) WITH PROPOFOL N/A 09/07/2016   Procedure: ESOPHAGOGASTRODUODENOSCOPY (EGD) WITH PROPOFOL;  Surgeon: Arta Silence, MD;  Location: [facility];  Service: Endoscopy;  Laterality: N/A;  . HYSTERECTOMY ABDOMINAL WITH SALPINGECTOMY Bilateral 02/17/2017   Procedure: HYSTERECTOMY ABDOMINAL WITH SALPINGECTOMY;  Surgeon: Christophe Louis, MD;  Location: [facility];  Service: Gynecology;  Laterality: Bilateral;  . KNEE ARTHROSCOPY     for torn meniscus  . WISDOM TOOTH EXTRACTION    . WISDOM TOOTH EXTRACTION      Social History   Socioeconomic History  . Marital status: Divorced  Spouse name: Not on file  . Number of children: Not on file  . Years of education: Not on file  . Highest education level: Not on file  Occupational History  . Not on file  Tobacco Use  . Smoking status: Never Smoker  . Smokeless tobacco: Never Used  Substance and Sexual Activity  . Alcohol use: No    Alcohol/week: 0.0 standard drinks  . Drug use: No  . Sexual activity: Never    Birth control/protection: Abstinence  Other Topics Concern  . Not on file  Social History Narrative  . Not on file   Social Determinants of Health   Financial Resource  Strain:   . Difficulty of Paying Living Expenses: Not on file  Food Insecurity:   . Worried About Charity fundraiser in the Last Year: Not on file  . Ran Out of Food in the Last Year: Not on file  Transportation Needs:   . Lack of Transportation (Medical): Not on file  . Lack of Transportation (Non-Medical): Not on file  Physical Activity:   . Days of Exercise per Week: Not on file  . Minutes of Exercise per Session: Not on file  Stress:   . Feeling of Stress : Not on file  Social Connections:   . Frequency of Communication with Friends and Family: Not on file  . Frequency of Social Gatherings with Friends and Family: Not on file  . Attends Religious Services: Not on file  . Active Member of Clubs or Organizations: Not on file  . Attends Archivist Meetings: Not on file  . Marital Status: Not on file     FAMILY HISTORY:  We obtained a detailed, 4-generation family history.  Significant diagnoses are listed below: Family History  Problem Relation Age of Onset  . Hypertension Mother   . CAD Maternal Grandfather   . Breast cancer Paternal Grandmother   . HIV Brother   . Heart attack Brother        sudden MI vs PE  . Breast cancer Sister 5  . Breast cancer Other        one dx 81s, others dx in 55s  . Colon polyps Neg Hx   . Colon cancer Neg Hx   . Liver disease Neg Hx    Loretta Nelson has 2 daughters, they are fraternal twins, age 61, no cancer history. Patient had 2 sisters and 2 brothers. Both brothers are deceased, no cancer history. One of her sisters had breast cancer at 15 and is living at 52, no genetic testing that she is aware of.   Loretta Nelson mother is living at 71 with no history of cancer. Patient has 1 maternal uncle, no cancers. No known cancers in her 2 maternal first cousins. Maternal grandmother died at 74 in a car accident. Grandfather had passed before she was born and she does not have information about him.  Loretta Nelson father died in his late  57s, she has limited information about him. Patient had 2 paternal aunts, 2 paternal uncles. No known cancers for these individuals. No known cancers in paternal first cousins. She does have 3 paternal second cousins who had breast cancer, one diagnosed in her 57s, the other two in their 51s. Paternal grandmother had breast cancer, unsure age of diagnosis, died at 43. Paternal grandfather died in his 71s, limited information.   Loretta Nelson is unaware of previous family history of genetic testing for hereditary cancer risks. Patient's maternal ancestors are  of African American descent, and paternal ancestors are of African American descent. There is no reported Ashkenazi Jewish ancestry. There is no known consanguinity.  GENETIC COUNSELING ASSESSMENT: Loretta Nelson is a 54 y.o. female with a personal and family history which is somewhat suggestive of a hereditary cancer syndrome and predisposition to cancer. We, therefore, discussed and recommended the following at today's visit.   DISCUSSION: We discussed that 5 - 10% of breast cancer is hereditary, with most cases associated with BRCA1/BRCA2 mutations.  There are other genes that can be associated with hereditary breast cancer syndromes.  We discussed that testing is beneficial for several reasons including surgical decision-making for breast cancer, knowing how to follow individuals after completing their treatment, and understand if other family members could be at risk for cancer and allow them to undergo genetic testing.   We reviewed the characteristics, features and inheritance patterns of hereditary cancer syndromes. We also discussed genetic testing, including the appropriate family members to test, the process of testing, insurance coverage and turn-around-time for results. We discussed the implications of a negative, positive and/or variant of uncertain significant result. In order to get genetic test results in a timely manner so that Loretta Nelson can  use these genetic test results for surgical decisions, we recommended Loretta Nelson pursue genetic testing for the [facility] Breast Cancer STAT Panel. Once complete, we recommend Loretta Nelson pursue reflex genetic testing to the Common Hereditary Cancers gene panel.   The STAT Breast cancer panel offered by [facility] includes sequencing and rearrangement analysis for the following 9 genes:  ATM, BRCA1, BRCA2, CDH1, CHEK2, PALB2, PTEN, STK11 and TP53.    The Common Hereditary Cancers Panel offered by [facility] includes sequencing and/or deletion duplication testing of the following 48 genes: APC, ATM, AXIN2, BARD1, BMPR1A, BRCA1, BRCA2, BRIP1, CDH1, CDKN2A (p14ARF), CDKN2A (p16INK4a), CKD4, CHEK2, CTNNA1, DICER1, EPCAM (Deletion/duplication testing only), GREM1 (promoter region deletion/duplication testing only), KIT, MEN1, MLH1, MSH2, MSH3, MSH6, MUTYH, NBN, NF1, NHTL1, PALB2, PDGFRA, PMS2, POLD1, POLE, PTEN, RAD50, RAD51C, RAD51D, RNF43, SDHB, SDHC, SDHD, SMAD4, SMARCA4. STK11, TP53, TSC1, TSC2, and VHL.  The following genes were evaluated for sequence changes only: SDHA and HOXB13 c.251G>A variant only.  Based on Ms. Peppel's personal and family history of cancer, she meets medical criteria for genetic testing. Despite that she meets criteria, she may still have an out of pocket cost.   PLAN: After considering the risks, benefits, and limitations, Loretta Nelson provided informed consent to pursue genetic testing and the blood sample was sent to [facility] for analysis of the STAT Breast Cancer Panel + Common Hereditary Cancers Panel. Results should be available within approximately 1 weeks' time, at which point they will be disclosed by telephone to Loretta Nelson, as will any additional recommendations warranted by these results. Loretta Nelson will receive a summary of her genetic counseling visit and a copy of her results once available. This information will also be available in Epic.   Lastly, we encouraged Ms.  Nelson to remain in contact with cancer genetics annually so that we can continuously update the family history and inform her of any changes in cancer genetics and testing that may be of benefit for this family.   Loretta Nelson questions were answered to her satisfaction today. Our contact information was provided should additional questions or concerns arise. Thank you for the referral and allowing [place] to share in the care of your patient.   Faith Rogue, MS, [facility] Genetic Counselor [email or web address] Phone: [phone]  The patient was seen for a total of 20 minutes in virtual genetic counseling.  Drs. Magrinat, Lindi Adie and/or Burr Medico were available for discussion regarding this case.   _______________________________________________________________________ For Office Staff:  Number of people involved in session: 1 Was an Intern/ student involved with case: no

## 2019-04-10 NOTE — Therapy (Signed)
[place], [place], [address] Phone: [phone]   Fax:  [phone]  Physical Therapy Evaluation  Patient Details  Name: Loretta [place] MRN: [id number] Date of Birth: 1965-04-23 Referring Provider (PT): Dr. Donnie Mesa   Encounter Date: 04/10/2019  PT End of Session - 04/10/19 1818    Visit Number  1    Number of Visits  2    Date for PT Re-Evaluation  06/05/19    PT Start Time  1829    PT Stop Time  9371   Also saw pt from 1437-1502 for a total of 43 minutes   PT Time Calculation (min)  18 min    Activity Tolerance  Patient tolerated treatment well    Behavior During Therapy  [facility] for tasks assessed/performed       Past Medical History:  Diagnosis Date  . Allergic rhinitis   . Anemia    low iron  . Anemia   . Anxiety   . Anxiety   . Arthritis    right knee, surgery done  . Complication of anesthesia    woke up during EGD  . Esophageal stricture   . Family history of breast cancer   . GERD (gastroesophageal reflux disease)   . Graves disease 01/13/11   radioactive iodine treatment, 69.6 millicuries  . Headache    tension headaches  . Hiatal hernia    small  . Hypertension   . Hyperthyroidism   . Hypothyroidism    s/p treatment  . Murmur    benign, h/o, caused by hyperdynamic contraction  . Obesity   . Pneumonia 2017   inhaler prescribed  . Vitamin D deficiency   . Vitamin D deficiency     Past Surgical History:  Procedure Laterality Date  . BALLOON DILATION N/A 09/07/2016   Procedure: BALLOON DILATION;  Surgeon: Arta Silence, MD;  Location: [facility];  Service: Endoscopy;  Laterality: N/A;  . [place]  . COLONOSCOPY    . ESOPHAGOGASTRODUODENOSCOPY    . ESOPHAGOGASTRODUODENOSCOPY (EGD) WITH PROPOFOL N/A 09/07/2016   Procedure: ESOPHAGOGASTRODUODENOSCOPY (EGD) WITH PROPOFOL;  Surgeon: Arta Silence, MD;  Location: [place];  Service: Endoscopy;   Laterality: N/A;  . HYSTERECTOMY ABDOMINAL WITH SALPINGECTOMY Bilateral 02/17/2017   Procedure: HYSTERECTOMY ABDOMINAL WITH SALPINGECTOMY;  Surgeon: Christophe Louis, MD;  Location: [employer] [facility] OR[facility];  Service: Gynecology;  Laterality: Bilateral;  . KNEE ARTHROSCOPY     for torn meniscus  . WISDOM TOOTH EXTRACTION    . WISDOM TOOTH EXTRACTION      There were no vitals filed for this visit.   Subjective Assessment - 04/10/19 1805    Subjective  Patient reports she is here today to be seen by her medical team for her newly diagnosed left breast cancer. She also has right shoulder and neck pain which has been present for several months. She has pain primarily while working on her computer.    Pertinent History  Patient was diagnosed 03/19/2019 with left grade III invasive ductal carcinoma breast cancer. It measures 8 mm and is located in the upper outer quadrant. It is weakly ER positive, PR negative, and HER2 negtive with a Ki67 of 30%.    Patient Stated Goals  Decrease right shoulder pain; learn post op shoulder ROM HEP; reduce lymphedema risk    Currently in Pain?  Yes    Pain Score  6     Pain Location  Scapula    Pain Orientation  Right  Pain Descriptors / Indicators  Aching;Sharp    Pain Type  Acute pain    Pain Onset  More than a month ago    Pain Frequency  Intermittent    Aggravating Factors   working on her computer    Pain Relieving Factors  Rest         [facility] PT Assessment - 04/10/19 0001      Assessment   Medical Diagnosis  Left breast cancer; right shoulder and neck pain    Referring Provider (PT)  Dr. Donnie Mesa    Onset Date/Surgical Date  03/19/19    Hand Dominance  Right    Prior Therapy  none      Precautions   Precautions  Other (comment)    Precaution Comments  active cancer      Restrictions   Weight Bearing Restrictions  No      Balance Screen   Has the patient fallen in the past 6 months  No    Has the patient had a decrease in activity level because of a fear  of falling?   No    Is the patient reluctant to leave their home because of a fear of falling?   No      Home Environment   Living Environment  Private residence    Living Arrangements  Children;Parent   29 y.o. 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    Leisure  She does not exercise      Cognition   Overall Cognitive Status  Within Functional Limits for tasks assessed      Posture/Postural Control   Posture/Postural Control  Postural limitations    Postural Limitations  Rounded Shoulders;Forward head    Posture Comments  Also has Dowager's hump      ROM / Strength   AROM / PROM / Strength  AROM;Strength      AROM   AROM Assessment Site  Shoulder;Cervical    Right/Left Shoulder  Right;Left    Right Shoulder Extension  40 Degrees    Right Shoulder Flexion  125 Degrees    Right Shoulder ABduction  145 Degrees    Right Shoulder Internal Rotation  32 Degrees   Painful   Right Shoulder External Rotation  68 Degrees    Left Shoulder Extension  42 Degrees    Left Shoulder Flexion  144 Degrees    Left Shoulder ABduction  145 Degrees    Left Shoulder Internal Rotation  59 Degrees    Left Shoulder External Rotation  80 Degrees    Cervical Flexion  WNL    Cervical Extension  25% limited    Cervical - Right Side Bend  25% limited    Cervical - Left Side Bend  25% limited    Cervical - Right Rotation  25% limited    Cervical - Left Rotation  25% limited      Strength   Overall Strength  Within functional limits for tasks performed    Overall Strength Comments  Pain with resisted right shoulder external rotation      Palpation   Palpation comment  Tender to palpation and palpably tight right intrascapular region      Special Tests    Special Tests  Rotator  Cuff Impingement     Rotator Cuff Impingment tests  Empty Can test;Speed's test;Drop Arm test      Empty Can test   Findings  Negative    Side  Right      Drop Arm test   Findings  Negative    Side  Right      Speed's test   Findings  Negative    Side  Right        LYMPHEDEMA/ONCOLOGY QUESTIONNAIRE - 04/10/19 1816      Type   Cancer Type  Left breast cancer      Lymphedema Assessments   Lymphedema Assessments  Upper extremities      Right Upper Extremity Lymphedema   10 cm Proximal to Olecranon Process  46.3 cm    Olecranon Process  31.3 cm    10 cm Proximal to Ulnar Styloid Process  29.4 cm    Just Proximal to Ulnar Styloid Process  19 cm    Across Hand at [employer]  20.4 cm    At [place] of 2nd Digit  6.9 cm      Left Upper Extremity Lymphedema   10 cm Proximal to Olecranon Process  47.9 cm    Olecranon Process  32 cm    10 cm Proximal to Ulnar Styloid Process  29.4 cm    Just Proximal to Ulnar Styloid Process  20 cm    Across Hand at [employer]  21 cm    At [place] of 2nd Digit  6.9 cm          Quick Dash - 04/10/19 0001    Open a tight or new jar  Mild difficulty    Do heavy household chores (wash walls, wash floors)  Mild difficulty    Carry a shopping bag or briefcase  No difficulty    Wash your back  Mild difficulty    Use a knife to cut food  No difficulty    Recreational activities in which you take some force or impact through your arm, shoulder, or hand (golf, hammering, tennis)  No difficulty    During the past week, to what extent has your arm, shoulder or hand problem interfered with your normal social activities with family, friends, neighbors, or groups?  Not at all    During the past week, to what extent has your arm, shoulder or hand problem limited your work or other regular daily activities  Slightly    Arm, shoulder, or hand pain.  Moderate    Tingling (pins and needles) in your arm, shoulder, or hand  Moderate    Difficulty Sleeping  Mild difficulty     DASH Score  20.45 %        Objective measurements completed on examination: See above findings.      Patient was instructed today in a home exercise program today for post op shoulder range of motion. These included active assist shoulder flexion in sitting, scapular retraction, wall walking with shoulder abduction, and hands behind head external rotation.  She was encouraged to do these twice a day, holding 3 seconds and repeating 5 times when permitted by her physician.       PT Education - 04/10/19 1817    Education Details  Lymphedema risk reduction and post op shoulder ROM HEP    Person(s) Educated  Patient    Methods  Explanation;Demonstration;Handout    Comprehension  Returned demonstration;Verbalized understanding  PT Long Term Goals - 04/10/19 1908      PT LONG TERM GOAL #1   Title  Patient will report >/= 25% reduction in right scapular and neck pain to tolerate working on her computer with greater ease.    Time  8    Period  Weeks    Status  New      PT LONG TERM GOAL #2   Title  Patient will increase right shoulder internal rotation ROM to >/= 55 degrees for increased ease reaching behind back.    Baseline  32 degrees    Time  8    Period  Weeks    Status  New      PT LONG TERM GOAL #3   Title  Patient will demonstrate and verbalize proper sitting posture for decreased strain on neck and scapula while working on her computer.    Time  8    Period  Weeks    Status  New      PT LONG TERM GOAL #4   Title  Patient will improve her DASH score to be </= 8 for increased function of right upper extremity.    Time  8    Period  Weeks    Status  New      PT LONG TERM GOAL #5   Title  Patient will demonstrate she has regained left shoulder ROM and function post operatively compared to baselines.    Time  8    Period  Weeks    Status  New    Target Date  06/05/19      Breast Clinic Goals - 04/10/19 1904      Patient will be able to verbalize  understanding of pertinent lymphedema risk reduction practices relevant to her diagnosis specifically related to skin care.   Time  1    Period  Days    Status  Achieved      Patient will be able to return demonstrate and/or verbalize understanding of the post-op home exercise program related to regaining shoulder range of motion.   Time  1    Period  Days    Status  Achieved      Patient will be able to verbalize understanding of the importance of attending the postoperative After Breast Cancer Class for further lymphedema risk reduction education and therapeutic exercise.   Time  1    Period  Days    Status  Achieved            Plan - 04/10/19 1819    Clinical Impression Statement  Patient was diagnosed 03/19/2019 with left grade III invasive ductal carcinoma breast cancer. It measures 8 mm and is located in the upper outer quadrant. It is weakly ER positive, PR negative, and HER2 negtive with a Ki67 of 30%. Her multidisciplinary medical team met prior to her assessments to determine a recommended treatment plan. She is planning to have a left lumpectomy and sentinel node biopsy followed by radiation and anti-estrogen. An Oncotype test will be performed on the core biopsy so she may have chemotherapy between surgery and radiation. She will benefit from a post op PT reassessment to determine needs. She will begin PT tomorrow to address her right scapular pain. This is worst while working on her computer so she will benefit from work station and postural recommendations, scapular stretching, postural exercises, and soft tissue work to her right scapular region.    Stability/Clinical Decision Making  Stable/Uncomplicated  Clinical Decision Making  Low    Rehab Potential  Excellent    PT Frequency  2x / week   Followed by 1 reassessment visit post operatively   PT Duration  4 weeks    PT Treatment/Interventions  ADLs/Self Care Home Management;Therapeutic exercise;Manual  techniques;Patient/family education;Joint Manipulations;Passive range of motion;Therapeutic activities    PT Next Visit Plan  Begin soft tissue work to right intrascapular region, postural exercises    PT Home Exercise Plan  Post op shoulder ROM HEP    Consulted and Agree with Plan of Care  Patient       Patient will benefit from skilled therapeutic intervention in order to improve the following deficits and impairments:  Postural dysfunction, Decreased range of motion, Pain, Impaired UE functional use, Decreased knowledge of precautions  Visit Diagnosis: Malignant neoplasm of upper-outer quadrant of left breast in female, estrogen receptor positive ([place]) - Plan: PT plan of care cert/re-cert  Abnormal posture - Plan: PT plan of care cert/re-cert  Chronic right shoulder pain - Plan: PT plan of care cert/re-cert  Stiffness of right shoulder, not elsewhere classified - Plan: PT plan of care cert/re-cert   Patient will follow up at outpatient cancer rehab 3-4 weeks following surgery.  If the patient requires physical therapy at that time, a specific plan will be dictated and sent to the referring physician for approval. The patient was educated today on appropriate basic range of motion exercises to begin post operatively and the importance of attending the After Breast Cancer class following surgery.  Patient was educated today on lymphedema risk reduction practices as it pertains to recommendations that will benefit the patient immediately following surgery.  She verbalized good understanding.      Problem List Patient Active Problem List   Diagnosis Date Noted  . Family history of breast cancer   . Malignant neoplasm of upper-outer quadrant of left breast in female, estrogen receptor positive ([place]) 04/05/2019  . Flat foot 03/12/2018  . 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 Fibroids, intramural 02/17/2017  . Menorrhagia 02/17/2017  . S/P hysterectomy 02/17/2017   Annia Friendly, PT 04/10/19 7:11 PM  [place], [place], [address] Phone: [phone]   Fax:  [phone]  Name: Starasia Sinko MRN: [id number] Date of Birth: Jul 17, 1965

## 2019-04-11 ENCOUNTER — Encounter: Payer: Self-pay | Admitting: Physical Therapy

## 2019-04-11 ENCOUNTER — Telehealth: Payer: Self-pay | Admitting: Oncology

## 2019-04-11 ENCOUNTER — Ambulatory Visit: Payer: Self-pay | Admitting: Surgery

## 2019-04-11 NOTE — Telephone Encounter (Signed)
I talk with patient regarding schedule  

## 2019-04-11 NOTE — H&P (View-Only) (Signed)
History of Present Illness Imogene Burn. Nyliah Nierenberg MD; 04/10/2019 8:51 PM) The patient is a 54 year old female who presents with breast cancer. [place]  PCP - [place] - Christophe Louis  This is a 54 year old female who underwent recent screen mammogram that showed an abnormality in her left breast. Subsequently, she was found to have an 8 mm mass in the left breast at 12:00 10 cmfn. Biopsy revealed IDC Grade 3, ER only 10%, PR-, Her 2 -, Ki67 30%. Axilla was negative.  The patient's sister had breast cancer and required chemotherapy. Unfortunately, her best friend passed away this morning in [place] from metastatic breast cancer. The patient is alone today.   CLINICAL DATA: 54 year old patient recalled from recent screening mammogram for evaluation a left breast mass. The patient's sister was diagnosed with breast cancer at age 90.  EXAM: DIGITAL DIAGNOSTIC LEFT MAMMOGRAM WITH CAD AND TOMO  ULTRASOUND LEFT BREAST  COMPARISON: Previous exam(s).  ACR Breast Density Category b: There are scattered areas of fibroglandular density.  FINDINGS: Spot compression views with tomography confirm an irregular mass in the 12 o'clock axis of the left breast, posterior third. The mass measures approximately 8 mm.  Mammographic images were processed with CAD.  Targeted ultrasound is performed, showing a hypoechoic irregular mass in the deep aspect of the breast parenchyma at 12 o'clock position 10 cm from nipple. The mass measures 8 x 5 x 7 mm.  Ultrasound the left axilla is negative for lymphadenopathy.  IMPRESSION: Suspicious 8 mm mass 12 o'clock position left breast 10 cm from the nipple.  RECOMMENDATION: Ultrasound-guided core needle biopsy is recommended and will be scheduled for the patient.  I have discussed the findings and recommendations with the patient. If applicable, a reminder letter will be sent to the  patient regarding the next appointment.  BI-RADS CATEGORY 5: Highly suggestive of malignancy.   Electronically Signed By: Curlene Dolphin M.D. On: 03/29/2019 14:34  CLINICAL DATA: 8 mm mass in the 12 o'clock position of the left breast at recent mammography and ultrasound.  EXAM: ULTRASOUND GUIDED LEFT BREAST CORE NEEDLE BIOPSY  COMPARISON: Previous exam(s).  FINDINGS: I met with the patient and we discussed the procedure of ultrasound-guided biopsy, including benefits and alternatives. We discussed the high likelihood of a successful procedure. We discussed the risks of the procedure, including infection, bleeding, tissue injury, clip migration, and inadequate sampling. Informed written consent was given. The usual time-out protocol was performed immediately prior to the procedure.  Using sterile technique and 1% Lidocaine as local anesthetic, under direct ultrasound visualization, a 12 gauge spring-loaded device was used to perform biopsy of the recently demonstrated 8 mm mass in the 12 o'clock position of the left breast using a caudal approach. At the conclusion of the procedure a ribbon shaped tissue marker clip was deployed into the biopsy cavity. Follow up 2 view mammogram was performed and dictated separately.  IMPRESSION: Ultrasound guided biopsy of the recently demonstrated 8 mm mass in the 12 o'clock position of the left breast. No apparent complications.  Electronically Signed: By: Claudie Revering M.D. On: 04/03/2019 08:11   Diagnosis Breast, left, needle core biopsy, 73m mass 12 o'clock - INVASIVE MAMMARY CARCINOMA, SEE COMMENT. Microscopic Comment The carcinoma appears grade 3 and measures 4 mm in greatest linear extent. E-cadherin will be ordered. Prognostic makers will be ordered. Dr. BMelina Copahas reviewed the case. The case was [facility] to [facility] on 04/04/2019. Vicente Males MD Pathologist, Electronic Signature (Case signed  04/04/2019)  PROGNOSTIC INDICATORS Results: IMMUNOHISTOCHEMICAL AND MORPHOMETRIC ANALYSIS PERFORMED MANUALLY The tumor cells are NEGATIVE for Her2 (1+). Estrogen Receptor: 10%, POSITIVE, WEAK STAINING INTENSITY Progesterone Receptor: 0%, NEGATIVE Proliferation Marker Ki67: 30% COMMENT: The negative hormone receptor study(ies) in this case has NO internal positive control. REFERENCE RANGE ESTROGEN RECEPTOR NEGATIVE 0% POSITIVE =>1% REFERENCE RANGE PROGESTERONE RECEPTOR NEGATIVE 0% POSITIVE =>1% All controls stained appropriately Thressa Sheller MD Pathologist, Electronic Signature ( Signed 04/05/2019) E-cadherin is positive consistent with a ductal phenotype. 1 of   Problem List/Past Medical Imogene Burn. 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 Selby Slovacek, MD; 04/10/2019 8:55 PM) No Known Allergies [04/08/2019]:  Medication History Imogene Burn. Sagrario Lineberry, MD; 04/10/2019 8:55 PM) Medications Reconciled amLODIPine Besylate (5MG Tablet, Oral) Active. Cholecalciferol (100 MCG(4000 UT) Capsule, Oral) Active. CeleXA (20MG Tablet, Oral) Active. Edarbyclor (40-25MG Tablet, Oral) Active. Flonase (50MCG/DOSE Inhaler, Nasal) Active. Ibuprofen (800MG Tablet, Oral) Active. Klor-Con M20 [facility] Tablet ER, Oral) Active. Cerefolin (07-29-48-5MG Tablet, Oral) Active. Synthroid (175MCG Tablet, Oral) Active. Claritin (10MG Tablet, Oral) Active. Toprol XL (200MG Tablet ER 24HR, Oral) Active. PriLOSEC (40MG Capsule DR, Oral) Active.    Physical Exam Rodman Key K. Takita Riecke MD; 04/10/2019 8:53 PM) The physical exam findings are as follows: Note:BMI 52.82  WDWN in NAD Eyes: Pupils equal, round; sclera anicteric HENT: Wearing mask Lymph: no supraclav or cervical lymphadenopathy Neck: No masses palpated, no thyromegaly Lungs: CTA bilaterally; normal respiratory effort Breasts: symmetric; no nipple changes; no axillary  lymphadenopathy; no palpable dominant masses in either breast CV: Regular rate and rhythm; no murmurs; extremities well-perfused with no edema Abd: +bowel sounds, soft, non-tender, no palpable organomegaly; no palpable hernias Skin: Warm, dry; no sign of jaundice Psychiatric - alert and oriented x 4; calm mood and affect    Assessment & Plan Rodman Key K. 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 The surgical procedure has been discussed with the patient. Potential risks, benefits, alternative treatments, and expected outcomes have been explained. All of the patient's questions at this time have been answered. The likelihood of reaching the patient's treatment goal is good. The patient understand the proposed surgical procedure and wishes to proceed.   We spent some time discussing other surgical options, including mastectomy +/- reconstruction. However, she wishes to conserve her breast if possible.  Oncotype from original biopsy. If chemotherapy is indicated, we will place a port at the time of surgery Adjuvant radiation Genetic testing  Note:Total time spent today on review of medical records, radiologic tests, pathology results in addition to face-to-face time with the patient and subsequent documentation: 48  Coordination with oncology, rad oncology, radiology, physical therapy  Imogene Burn. Georgette Dover, MD, [facility] Surgery  General/ Trauma Surgery   04/11/2019 7:24 AM

## 2019-04-11 NOTE — Progress Notes (Signed)
Clinical Social Work [facility] Psychosocial Distress Screening [facility]   Patient completed distress screening protocol and scored a 3 on the Psychosocial Distress Thermometer which indicates mild distress. Clinical Social Worker met with patient in [facility] to assess for distress and other psychosocial needs.  Patient stated she was feeling stressed but felt better after meeting with the treatment team and getting more information on her treatment plan.  Plans on taking it one step at a time and relying on God. Difficult time for her today as her best friend from childhood (who lives in [place]) died today from breast cancer.  CSW and patient discussed common feeling and emotions when being diagnosed with cancer, and the importance of support during treatment.    Barriers to care/review of distress screen:  - Transportation:  Able to get to appointments: yes - Help at home:  Living Situation: patient lives with twin daughters (age 21), her mom. Sister is a big support as well. - Support system:   o Level of family support:  Good o Support system includes: Family, Friends/Colleagues and Church - Finances:  Employment status: currently employed through [employer] & source of income: Employment. Looking into her benefits through work. Planning on continuing work through treatments   Clinical Social Worker follow up needed: No.  CSW informed patient of the support team and support services at [facility].  CSW provided contact information and encouraged patient to call with any questions or concerns.        Distress Screen: ONCBCN DISTRESS SCREENING 04/10/2019  Screening Type Initial Screening  Distress experienced in past week (1-10) 3  Emotional problem type Adjusting to illness      E   Clinical Social Worker [facility]       

## 2019-04-11 NOTE — H&P (Signed)
History of Present Illness Imogene Burn. Jacquie Lukes MD; 04/10/2019 8:51 PM) The patient is a 54 year old female who presents with breast cancer. [place]  PCP - [place] - Christophe Louis  This is a 54 year old female who underwent recent screen mammogram that showed an abnormality in her left breast. Subsequently, she was found to have an 8 mm mass in the left breast at 12:00 10 cmfn. Biopsy revealed IDC Grade 3, ER only 10%, PR-, Her 2 -, Ki67 30%. Axilla was negative.  The patient's sister had breast cancer and required chemotherapy. Unfortunately, her best friend passed away this morning in [place] from metastatic breast cancer. The patient is alone today.   CLINICAL DATA: 54 year old patient recalled from recent screening mammogram for evaluation a left breast mass. The patient's sister was diagnosed with breast cancer at age 90.  EXAM: DIGITAL DIAGNOSTIC LEFT MAMMOGRAM WITH CAD AND TOMO  ULTRASOUND LEFT BREAST  COMPARISON: Previous exam(s).  ACR Breast Density Category b: There are scattered areas of fibroglandular density.  FINDINGS: Spot compression views with tomography confirm an irregular mass in the 12 o'clock axis of the left breast, posterior third. The mass measures approximately 8 mm.  Mammographic images were processed with CAD.  Targeted ultrasound is performed, showing a hypoechoic irregular mass in the deep aspect of the breast parenchyma at 12 o'clock position 10 cm from nipple. The mass measures 8 x 5 x 7 mm.  Ultrasound the left axilla is negative for lymphadenopathy.  IMPRESSION: Suspicious 8 mm mass 12 o'clock position left breast 10 cm from the nipple.  RECOMMENDATION: Ultrasound-guided core needle biopsy is recommended and will be scheduled for the patient.  I have discussed the findings and recommendations with the patient. If applicable, a reminder letter will be sent to the  patient regarding the next appointment.  BI-RADS CATEGORY 5: Highly suggestive of malignancy.   Electronically Signed By: Curlene Dolphin M.D. On: 03/29/2019 14:34  CLINICAL DATA: 8 mm mass in the 12 o'clock position of the left breast at recent mammography and ultrasound.  EXAM: ULTRASOUND GUIDED LEFT BREAST CORE NEEDLE BIOPSY  COMPARISON: Previous exam(s).  FINDINGS: I met with the patient and we discussed the procedure of ultrasound-guided biopsy, including benefits and alternatives. We discussed the high likelihood of a successful procedure. We discussed the risks of the procedure, including infection, bleeding, tissue injury, clip migration, and inadequate sampling. Informed written consent was given. The usual time-out protocol was performed immediately prior to the procedure.  Using sterile technique and 1% Lidocaine as local anesthetic, under direct ultrasound visualization, a 12 gauge spring-loaded device was used to perform biopsy of the recently demonstrated 8 mm mass in the 12 o'clock position of the left breast using a caudal approach. At the conclusion of the procedure a ribbon shaped tissue marker clip was deployed into the biopsy cavity. Follow up 2 view mammogram was performed and dictated separately.  IMPRESSION: Ultrasound guided biopsy of the recently demonstrated 8 mm mass in the 12 o'clock position of the left breast. No apparent complications.  Electronically Signed: By: Claudie Revering M.D. On: 04/03/2019 08:11   Diagnosis Breast, left, needle core biopsy, 73m mass 12 o'clock - INVASIVE MAMMARY CARCINOMA, SEE COMMENT. Microscopic Comment The carcinoma appears grade 3 and measures 4 mm in greatest linear extent. E-cadherin will be ordered. Prognostic makers will be ordered. Dr. BMelina Copahas reviewed the case. The case was called to [facility] on 04/04/2019. Vicente Males MD Pathologist, Electronic Signature (Case signed  04/04/2019)  PROGNOSTIC INDICATORS Results: IMMUNOHISTOCHEMICAL AND MORPHOMETRIC ANALYSIS PERFORMED MANUALLY The tumor cells are NEGATIVE for Her2 (1+). Estrogen Receptor: 10%, POSITIVE, WEAK STAINING INTENSITY Progesterone Receptor: 0%, NEGATIVE Proliferation Marker Ki67: 30% COMMENT: The negative hormone receptor study(ies) in this case has NO internal positive control. REFERENCE RANGE ESTROGEN RECEPTOR NEGATIVE 0% POSITIVE =>1% REFERENCE RANGE PROGESTERONE RECEPTOR NEGATIVE 0% POSITIVE =>1% All controls stained appropriately Thressa Sheller MD Pathologist, Electronic Signature ( Signed 04/05/2019) E-cadherin is positive consistent with a ductal phenotype. 1 of   Problem List/Past Medical Imogene Burn. 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 Viola Placeres, MD; 04/10/2019 8:55 PM) No Known Allergies [04/08/2019]:  Medication History Imogene Burn. Dimitriy Carreras, MD; 04/10/2019 8:55 PM) Medications Reconciled amLODIPine Besylate (5MG Tablet, Oral) Active. Cholecalciferol (100 MCG(4000 UT) Capsule, Oral) Active. CeleXA (20MG Tablet, Oral) Active. Edarbyclor (40-25MG Tablet, Oral) Active. Flonase (50MCG/DOSE Inhaler, Nasal) Active. Ibuprofen (800MG Tablet, Oral) Active. Klor-Con M20 [facility] Tablet ER, Oral) Active. Cerefolin (07-29-48-5MG Tablet, Oral) Active. Synthroid (175MCG Tablet, Oral) Active. Claritin (10MG Tablet, Oral) Active. Toprol XL (200MG Tablet ER 24HR, Oral) Active. PriLOSEC (40MG Capsule DR, Oral) Active.    Physical Exam Rodman Key K. Madoc Holquin MD; 04/10/2019 8:53 PM) The physical exam findings are as follows: Note:BMI 52.82  WDWN in NAD Eyes: Pupils equal, round; sclera anicteric HENT: Wearing mask Lymph: no supraclav or cervical lymphadenopathy Neck: No masses palpated, no thyromegaly Lungs: CTA bilaterally; normal respiratory effort Breasts: symmetric; no nipple changes; no axillary  lymphadenopathy; no palpable dominant masses in either breast CV: Regular rate and rhythm; no murmurs; extremities well-perfused with no edema Abd: +bowel sounds, soft, non-tender, no palpable organomegaly; no palpable hernias Skin: Warm, dry; no sign of jaundice Psychiatric - alert and oriented x 4; calm mood and affect    Assessment & Plan Rodman Key K. 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 The surgical procedure has been discussed with the patient. Potential risks, benefits, alternative treatments, and expected outcomes have been explained. All of the patient's questions at this time have been answered. The likelihood of reaching the patient's treatment goal is good. The patient understand the proposed surgical procedure and wishes to proceed.   We spent some time discussing other surgical options, including mastectomy +/- reconstruction. However, she wishes to conserve her breast if possible.  Oncotype from original biopsy. If chemotherapy is indicated, we will place a port at the time of surgery Adjuvant radiation Genetic testing  Note:Total time spent today on review of medical records, radiologic tests, pathology results in addition to face-to-face time with the patient and subsequent documentation: 85  Coordination with oncology, rad oncology, radiology, physical therapy  Imogene Burn. Georgette Dover, MD, [facility]  General/ Trauma Surgery   04/11/2019 7:24 AM

## 2019-04-12 ENCOUNTER — Other Ambulatory Visit: Payer: Self-pay | Admitting: Surgery

## 2019-04-12 DIAGNOSIS — C50912 Malignant neoplasm of unspecified site of left female breast: Secondary | ICD-10-CM

## 2019-04-13 ENCOUNTER — Other Ambulatory Visit (HOSPITAL_COMMUNITY)
Admission: RE | Admit: 2019-04-13 | Discharge: 2019-04-13 | Disposition: A | Discharge status: Home / Self Care | Payer: BC Managed Care – PPO | Source: Ambulatory Visit | Attending: Surgery | Admitting: Surgery

## 2019-04-13 DIAGNOSIS — Z20822 Contact with and (suspected) exposure to covid-19: Secondary | ICD-10-CM | POA: Insufficient documentation

## 2019-04-13 DIAGNOSIS — Z01812 Encounter for preprocedural laboratory examination: Secondary | ICD-10-CM | POA: Insufficient documentation

## 2019-04-13 LAB — SARS CORONAVIRUS 2 (TAT 6-24 HRS): SARS Coronavirus 2: NEGATIVE

## 2019-04-15 ENCOUNTER — Ambulatory Visit (INDEPENDENT_AMBULATORY_CARE_PROVIDER_SITE_OTHER): Payer: BC Managed Care – PPO | Admitting: Neurology

## 2019-04-15 DIAGNOSIS — G4720 Circadian rhythm sleep disorder, unspecified type: Secondary | ICD-10-CM

## 2019-04-15 DIAGNOSIS — G4733 Obstructive sleep apnea (adult) (pediatric): Secondary | ICD-10-CM | POA: Diagnosis not present

## 2019-04-15 DIAGNOSIS — G4719 Other hypersomnia: Secondary | ICD-10-CM

## 2019-04-15 DIAGNOSIS — R419 Unspecified symptoms and signs involving cognitive functions and awareness: Secondary | ICD-10-CM

## 2019-04-15 NOTE — Pre-Procedure Instructions (Signed)
Your procedure is scheduled on Wednesday, February 17th, from 2:15 PM to 3:45 PM.  Report to [facility] Main Entrance "A" at 12:15 P.M., and check in at the Admitting office.  Call this number if you have problems the morning of surgery:  [phone]  Call [phone] if you have any questions prior to your surgery date Monday-Friday 8am-4pm.    Remember:  Do not eat after midnight the night before your surgery.  You may drink clear liquids until 11:15 AM the morning of your surgery.    Clear liquids allowed are: Water, Non-Citrus Juices (without pulp), Carbonated Beverages, Clear Tea, Black Coffee Only, and Gatorade.   .  . The day of surgery:  o Drink ONE (1) Pre-Surgery Clear Ensure by 11:15 AM the morning of surgery.    o This drink was given to you during your hospital  pre-op appointment visit. o Nothing else to drink after completing the  Pre-Surgery Clear Ensure.      Take these medicines the morning of surgery with A SIP OF WATER : amLODipine (NORVASC)  citalopram (CELEXA)  levothyroxine (SYNTHROID) metoprolol (TOPROL-XL)  IF NEEDED: loratadine (CLARITIN) omeprazole (PRILOSEC)  As of today, STOP taking any Aspirin (unless otherwise instructed by your surgeon), Aleve, Naproxen, Ibuprofen, Motrin, Advil, Goody's, BC's, all herbal medications, fish oil, and all vitamins.    The Morning of Surgery  Do not wear jewelry, make-up or nail polish.  Do not wear lotions, powders, or perfumes, or deodorant.  Do not shave 48 hours prior to surgery.    Do not bring valuables to the hospital.  [facility] is not responsible for any belongings or valuables.  If you are a smoker, DO NOT Smoke 24 hours prior to surgery  If you wear a CPAP at night please bring your mask the morning of surgery   Remember that you must have someone to transport you home after your surgery, and remain with you for 24 hours if you are discharged the same day.   Please bring cases for  contacts, glasses, hearing aids, dentures or bridgework because it cannot be worn into surgery.    Leave your suitcase in the car.  After surgery it may be brought to your room.  For patients admitted to the hospital, discharge time will be determined by your treatment team.  Patients discharged the day of surgery will not be allowed to drive home.    Special instructions:   [facility]- Preparing For Surgery  Before surgery, you can play an important role. Because skin is not sterile, your skin needs to be as free of germs as possible. You can reduce the number of germs on your skin by washing with CHG (chlorahexidine gluconate) Soap before surgery.  CHG is an antiseptic cleaner which kills germs and bonds with the skin to continue killing germs even after washing.    Oral Hygiene is also important to reduce your risk of infection.  Remember - BRUSH YOUR TEETH THE MORNING OF SURGERY WITH YOUR REGULAR TOOTHPASTE  Please do not use if you have an allergy to CHG or antibacterial soaps. If your skin becomes reddened/irritated stop using the CHG.  Do not shave (including legs and underarms) for at least 48 hours prior to first CHG shower. It is OK to shave your face.  Please follow these instructions carefully.   1. Shower the NIGHT BEFORE SURGERY and the MORNING OF SURGERY with CHG Soap.   2. If you chose to wash your hair,  wash your hair first as usual with your normal shampoo.  3. After you shampoo, rinse your hair and body thoroughly to remove the shampoo.  4. Use CHG as you would any other liquid soap. You can apply CHG directly to the skin and wash gently with a scrungie or a clean washcloth.   5. Apply the CHG Soap to your body ONLY FROM THE NECK DOWN.  Do not use on open wounds or open sores. Avoid contact with your eyes, ears, mouth and genitals (private parts). Wash Face and genitals (private parts)  with your normal soap.   6. Wash thoroughly, paying special attention to the  area where your surgery will be performed.  7. Thoroughly rinse your body with warm water from the neck down.  8. DO NOT shower/wash with your normal soap after using and rinsing off the CHG Soap.  9. Pat yourself dry with a CLEAN TOWEL.  10. Wear CLEAN PAJAMAS to bed the night before surgery, wear comfortable clothes the morning of surgery  11. Place CLEAN SHEETS on your bed the night of your first shower and DO NOT SLEEP WITH PETS.    Day of Surgery:  Please shower the morning of surgery with the CHG soap Do not apply any deodorants/lotions. Please wear clean clothes to the hospital/surgery center.   Remember to brush your teeth WITH YOUR REGULAR TOOTHPASTE.   Please read over the following fact sheets that you were given.

## 2019-04-16 ENCOUNTER — Other Ambulatory Visit: Payer: Self-pay

## 2019-04-16 ENCOUNTER — Encounter: Payer: Self-pay | Admitting: Physical Therapy

## 2019-04-16 ENCOUNTER — Encounter (HOSPITAL_COMMUNITY): Payer: Self-pay

## 2019-04-16 ENCOUNTER — Ambulatory Visit
Admission: RE | Admit: 2019-04-16 | Discharge: 2019-04-16 | Disposition: A | Discharge status: Home / Self Care | Payer: BC Managed Care – PPO | Source: Ambulatory Visit | Attending: Surgery | Admitting: Surgery

## 2019-04-16 ENCOUNTER — Encounter (HOSPITAL_COMMUNITY)
Admission: RE | Admit: 2019-04-16 | Discharge: 2019-04-16 | Disposition: A | Discharge status: Home / Self Care | Payer: BC Managed Care – PPO | Source: Ambulatory Visit | Attending: Surgery | Admitting: Surgery

## 2019-04-16 DIAGNOSIS — E0500 Thyrotoxicosis with diffuse goiter without thyrotoxic crisis or storm: Secondary | ICD-10-CM | POA: Diagnosis not present

## 2019-04-16 DIAGNOSIS — D509 Iron deficiency anemia, unspecified: Secondary | ICD-10-CM | POA: Insufficient documentation

## 2019-04-16 DIAGNOSIS — Z7901 Long term (current) use of anticoagulants: Secondary | ICD-10-CM | POA: Insufficient documentation

## 2019-04-16 DIAGNOSIS — Z01818 Encounter for other preprocedural examination: Secondary | ICD-10-CM | POA: Insufficient documentation

## 2019-04-16 DIAGNOSIS — G4733 Obstructive sleep apnea (adult) (pediatric): Secondary | ICD-10-CM | POA: Insufficient documentation

## 2019-04-16 DIAGNOSIS — I10 Essential (primary) hypertension: Secondary | ICD-10-CM | POA: Diagnosis not present

## 2019-04-16 DIAGNOSIS — F419 Anxiety disorder, unspecified: Secondary | ICD-10-CM | POA: Insufficient documentation

## 2019-04-16 DIAGNOSIS — K219 Gastro-esophageal reflux disease without esophagitis: Secondary | ICD-10-CM | POA: Insufficient documentation

## 2019-04-16 DIAGNOSIS — Z79899 Other long term (current) drug therapy: Secondary | ICD-10-CM | POA: Insufficient documentation

## 2019-04-16 DIAGNOSIS — C50912 Malignant neoplasm of unspecified site of left female breast: Secondary | ICD-10-CM | POA: Insufficient documentation

## 2019-04-16 DIAGNOSIS — E039 Hypothyroidism, unspecified: Secondary | ICD-10-CM | POA: Insufficient documentation

## 2019-04-16 DIAGNOSIS — G43909 Migraine, unspecified, not intractable, without status migrainosus: Secondary | ICD-10-CM | POA: Diagnosis not present

## 2019-04-16 DIAGNOSIS — R9431 Abnormal electrocardiogram [ECG] [EKG]: Secondary | ICD-10-CM | POA: Insufficient documentation

## 2019-04-16 HISTORY — DX: Other enthesopathies, not elsewhere classified: M77.8

## 2019-04-16 LAB — POCT I-STAT, CHEM 8
BUN: 14 mg/dL (ref 6–20)
Calcium, Ion: 1.19 mmol/L (ref 1.15–1.40)
Chloride: 101 mmol/L (ref 98–111)
Creatinine, Ser: 0.8 mg/dL (ref 0.44–1.00)
Glucose, Bld: 108 mg/dL — ABNORMAL HIGH (ref 70–99)
HCT: 37 % (ref 36.0–46.0)
Hemoglobin: 12.6 g/dL (ref 12.0–15.0)
Potassium: 3 mmol/L — ABNORMAL LOW (ref 3.5–5.1)
Sodium: 141 mmol/L (ref 135–145)
TCO2: 31 mmol/L (ref 22–32)

## 2019-04-16 MED ORDER — DEXTROSE 5 % IV SOLN
3.0000 g | INTRAVENOUS | Status: DC
  Filled 2019-04-16: qty 3000

## 2019-04-16 NOTE — Anesthesia Preprocedure Evaluation (Addendum)
Anesthesia Evaluation  Patient identified by MRN, date of birth, ID band Patient awake  General Assessment Comment:Uvula swelling with previous procedure  Reviewed: Allergy & Precautions, NPO status , Patient's Chart, lab work & pertinent test results, reviewed documented beta blocker date and time   History of Anesthesia Complications (+) history of anesthetic complications  Airway Mallampati: III  TM Distance: >3 FB Neck ROM: Full    Dental  (+) Dental Advisory Given, Teeth Intact   Pulmonary sleep apnea ,    breath sounds clear to auscultation       Cardiovascular hypertension, Pt. on medications and Pt. on home beta blockers  Rhythm:Regular     Neuro/Psych  Headaches, PSYCHIATRIC DISORDERS Anxiety    GI/Hepatic Neg liver ROS, hiatal hernia, GERD  Medicated and Controlled,  Endo/Other  Hypothyroidism Morbid obesity  Renal/GU negative Renal ROS     Musculoskeletal  (+) Arthritis ,   Abdominal   Peds  Hematology  (+) anemia ,   Anesthesia Other Findings History includes never smoker, left breast cancer (diagnosed 04/2019), HTN, GERD, hiatal hernia, iron deficiency anemia, Graves disease (s/p radioactive iodine treatment 2012; post RAI hypothyroidism), murmur ("normal" echo, trivial MR/TR/PR 02/18/14), OSA (severe OSA 03/15/19), hysterectomy/bilateral salpingectomy (02/17/17). BMI is consistent with morbid obesity. Reportedly "woke up" during EGD (documented as no known anesthesia complications on 99991111 EGD anesthesia record).    As above, recent diagnosis of severe OSA. Home CPAP not yet arranged.  She did report what sounds like a follow-up sleep study (CPAP titration study?) on 04/15/19.  She denied SOB, chest pain, cough, fever at PAT RN visit. EKG showed SR, non-specific T wave abnormality. She had labs on 04/10/19 (through [facility]), but potassium was 2.9, so ISTAT8 was done at PAT to recheck--K+ now 3.0.  She does have  potassium supplements (20 mEq daily) at home but does not always remember to take them. She will take as prescribed today (04/16/19).  Reproductive/Obstetrics                            Anesthesia Physical Anesthesia Plan  ASA: III  Anesthesia Plan: General and Regional   Post-op Pain Management:  Regional for Post-op pain   Induction: Intravenous  PONV Risk Score and Plan: 3 and Dexamethasone and Ondansetron  Airway Management Planned: LMA and Oral ETT  Additional Equipment: None  Intra-op Plan:   Post-operative Plan: Extubation in OR  Informed Consent: I have reviewed the patients History and Physical, chart, labs and discussed the procedure including the risks, benefits and alternatives for the proposed anesthesia with the patient or authorized representative who has indicated his/her understanding and acceptance.     Dental advisory given  Plan Discussed with: CRNA and Surgeon  Anesthesia Plan Comments: (PAT note written 04/16/2019 by Myra Gianotti, PA-C. )       Anesthesia Quick Evaluation

## 2019-04-16 NOTE — Progress Notes (Signed)
PCP - Dr. [name] Cardiologist - denies Endocrinologist - Dr. Minette Brine  PPM/ICD - denies  Chest x-ray - N/A EKG - 04/16/2019 Stress Test - 2015 ECHO - 2015 Cardiac Cath- denies  Sleep Study - 03/15/2019, last study done lastnight per patient 04/15/2019 CPAP - no CPAP as yet,   DM: denies  Blood Thinner Instructions: N/A Aspirin Instructions: N/A  ERAS Protcol - Yes PRE-SURGERY Ensure or G2- Ensure given  COVID TEST- Tested on 04/13/2019. Negative results. Patient verbalized understanding of self-quarantine and wearing masks when outside and going to doctor's visits.  Anesthesia review: YES, scheduled seed placement today 04/16/2019. Radioactive seed implant  Patient denies shortness of breath, fever, cough and chest pain at PAT appointment   All instructions explained to the patient, with a verbal understanding of the material. Patient agrees to go over the instructions while at home for a better understanding. Patient also instructed to self quarantine after being tested for COVID-19. The opportunity to ask questions was provided.

## 2019-04-16 NOTE — Progress Notes (Signed)
Anesthesia Chart Review:  Case: [id number] Date/Time: 04/17/19 1400   Procedure: LEFT BREAST LUMPECTOMY WITH RADIOACTIVE SEED AND SENTINEL LYMPH NODE BIOPSY (Left Breast) - LMA, PEC BLOCK   Anesthesia type: General   Pre-op diagnosis: INVASIVE DUCTAL CARCINOMA LEFT BREAST   Location: [facility] OR ROOM 02 / [place] OR   Surgeons: Donnie Mesa, MD     RSL is being placed on 04/16/19 at 2:30 PM.  PAT 04/16/19 at 10:00 AM.  DISCUSSION: Patient is a 54 year old female scheduled for the above procedure.   History includes never smoker, left breast cancer (diagnosed 04/2019), HTN, GERD, hiatal hernia, iron deficiency anemia, Graves disease (s/p radioactive iodine treatment 2012; post RAI hypothyroidism), murmur ("normal" echo, trivial MR/TR/PR 02/18/14), OSA (severe OSA 03/15/19), hysterectomy/bilateral salpingectomy (02/17/17). BMI is consistent with morbid obesity. Reportedly "woke up" during EGD (documented as no known anesthesia complications on 99991111 EGD anesthesia record).    As above, recent diagnosis of severe OSA. Home CPAP not yet arranged.  She did report what sounds like a follow-up sleep study (CPAP titration study?) on 04/15/19.  She denied SOB, chest pain, cough, fever at PAT RN visit. EKG showed SR, non-specific T wave abnormality. She had labs on 04/10/19 (through [facility]), but potassium was 2.9, so ISTAT8 was done at PAT to recheck--K+ now 3.0.  She does have potassium supplements (20 mEq daily) at home but does not always remember to take them. She will take as prescribed today (04/16/19).  04/13/19 presurgical COVID-19 test was negative. Anesthesia team to evaluate on the day of surgery.   VS: BP (!) 143/75   Pulse 63   Temp 36.8 C (Oral)   Resp 17   Ht 5\' 4"  (1.626 m)   Wt (!) 137.5 kg   LMP 02/13/2017 (Exact Date)   SpO2 97%   BMI 52.04 kg/m    PROVIDERS: [name], MD is PCP - Lurline Del, MD is HEM-ONC - Kyung Rudd, MD is RAD-ONC - Delrae Rend, MD is endocrinologist -  Antony Contras, MD is neurologist. Seen for subjective memory loss 01/15/19. Thought to likely have mild cognitive impairment. He was to review recent brain MRI, and he also referred to neuropsychology and for sleep study.  Star Age, MD is neurologist for OSA. Recently diagnosed with severe OSA on 03/15/19. CPAP with repeat sleep study for proper titration and mas fitting and correct monitoring of oxygen saturations recommended.   - She is not routinely followed by cardiology, but she saw Ena Dawley on 01/16/14 for murmur, difficult to control hypertension, and family history of sudden death (brother age 42, no autopsy). BP then was 132/88 (anti-hypertensive medications had recently been adjusted). Echo and ETT ordered. Echo was considered "normal" (LVEF 55-60%, mild LVH, no regional wall motion abnormalities, trivial MR/PR/TR). ETT was non-ischemic.    LABS: Labs reviewed: Acceptable for surgery. 04/10/19 CMET and CBC reviewed, as well as ISTAT8 from 04/16/19 PAT visit. She will take her potassium supplement as prescribed today (04/16/19).  (all labs ordered are listed, but only abnormal results are displayed)  Labs Reviewed  POCT I-STAT, CHEM 8 - Abnormal; Notable for the following components:      Result Value   Potassium 3.0 (*)    Glucose, Bld 108 (*)    All other components within normal limits    OTHER: Baseline Polysomnogram 03/15/19: IMPRESSION: 1. Severe Obstructive Sleep Apnea (OSA) 2. Dysfunctions associated with sleep stages or arousal from sleep   IMAGES: MRI Brain 01/08/19 ([facility]): IMPRESSION:  1. No acute intracranial abnormality. 2. Few small discrete white matter lesions are identified. These lesions are abnormal but nonspecific, usually resulting from benign/remote/incidental causes (e.g. prior trauma/inflammation/demyelinization, or chronic ischemia associated with  migraines/atherosclerosis/other vasculopathies). 3. Empty sella. Differential diagnosis  includes intracranial hypertension versus arachnoid cyst versus a normal variant, recommend clinical correlation.   EKG: 04/16/19:  Normal sinus rhythm Nonspecific T wave abnormality   CV: Echo 02/18/14: Study Conclusions  - Left ventricle: The cavity size was normal. Wall thickness was  increased in a pattern of mild LVH. Systolic function was normal.  The estimated ejection fraction was in the range of 55% to 60%.  Wall motion was normal; there were no regional wall motion  abnormalities.  - Atrial septum: No defect or patent foramen ovale was identified.  - Mitral valve: Mildly thickened leaflets. Mobility was not restricted. Trivial regurgitation. - Pulmonic valve: Trivial regurgitation. - Tricuspid valve: Trivial regurgitation.   ETT 02/18/14: Comments: Patient presents today for routine GXT. Has had HTN that has been difficult to manage. For echo after today's study. She held ALL of her BP medicines for today's study.  Today the patient exercised on the standard Bruce protocol for a total of 6:30 minutes.  Reduced exercise tolerance.  Adequate blood pressure response. She took NO BP medicines today.  Clinically negative for chest pain. Test was stopped due to achievement of target HR and fatigue.  EKG negative for ischemia. Resting EKG with poor R wave progression. No significant arrhythmia noted.  Recommendations: CV risk factor modification   Past Medical History:  Diagnosis Date  . Allergic rhinitis   . Anemia    low iron  . Anemia   . Anxiety   . Anxiety   . Arthritis    right knee, surgery done  . Cancer ([place]) 2021   left breast   . Complication of anesthesia    woke up during EGD  . Esophageal stricture   . Family history of breast cancer   . GERD (gastroesophageal reflux disease)   . Graves disease 01/13/11   radioactive iodine treatment, 123456 millicuries  . Headache    tension headaches  . Hiatal hernia    small  . Hypertension   .  Hyperthyroidism   . Hypothyroidism    s/p treatment  . Murmur    benign, h/o, caused by hyperdynamic contraction  . Obesity   . Pneumonia 2017   inhaler prescribed  . Sleep apnea 2021   no CPAP as yet, last study done lastnight 04/15/2019  . Tendonitis of shoulder, right   . Vitamin D deficiency   . Vitamin D deficiency     Past Surgical History:  Procedure Laterality Date  . BALLOON DILATION N/A 09/07/2016   Procedure: BALLOON DILATION;  Surgeon: Arta Silence, MD;  Location: [facility];  Service: Endoscopy;  Laterality: N/A;  . [place]  . COLONOSCOPY    . ESOPHAGOGASTRODUODENOSCOPY    . ESOPHAGOGASTRODUODENOSCOPY (EGD) WITH PROPOFOL N/A 09/07/2016   Procedure: ESOPHAGOGASTRODUODENOSCOPY (EGD) WITH PROPOFOL;  Surgeon: Arta Silence, MD;  Location: [facility];  Service: Endoscopy;  Laterality: N/A;  . HYSTERECTOMY ABDOMINAL WITH SALPINGECTOMY Bilateral 02/17/2017   Procedure: HYSTERECTOMY ABDOMINAL WITH SALPINGECTOMY;  Surgeon: Christophe Louis, MD;  Location: [facility];  Service: Gynecology;  Laterality: Bilateral;  . KNEE ARTHROSCOPY     for torn meniscus  . WISDOM TOOTH EXTRACTION    . WISDOM TOOTH EXTRACTION      MEDICATIONS: . amLODipine (NORVASC) 5 MG tablet  .  Cholecalciferol 4000 UNITS CAPS  . citalopram (CELEXA) 20 MG tablet  . EDARBYCLOR 40-25 MG TABS  . ibuprofen (ADVIL) 200 MG tablet  . ibuprofen (ADVIL,MOTRIN) 800 MG tablet  . KLOR-CON M20 20 MEQ tablet  . L-Methylfolate-B12-B6-B2 (CEREFOLIN) 07-29-48-5 MG TABS  . levothyroxine (SYNTHROID) 175 MCG tablet  . loratadine (CLARITIN) 10 MG tablet  . metoprolol (TOPROL-XL) 200 MG 24 hr tablet  . omeprazole (PRILOSEC) 40 MG capsule  . spironolactone (ALDACTONE) 50 MG tablet   No current facility-administered medications for this encounter.  - Ibuprofen currently on hold. She has not started Aldactone yet.    Myra Gianotti, PA-C Surgical Short Stay/Anesthesiology [facility] Phone [phone] [facility] Phone  [phone] 04/16/2019 11:25 AM

## 2019-04-17 ENCOUNTER — Encounter (HOSPITAL_COMMUNITY)
Admission: RE | Admit: 2019-04-17 | Discharge: 2019-04-17 | Disposition: A | Discharge status: Home / Self Care | Payer: BC Managed Care – PPO | Source: Ambulatory Visit | Attending: Surgery | Admitting: Surgery

## 2019-04-17 ENCOUNTER — Encounter (HOSPITAL_COMMUNITY): Payer: Self-pay | Admitting: Surgery

## 2019-04-17 ENCOUNTER — Ambulatory Visit
Admission: RE | Admit: 2019-04-17 | Discharge: 2019-04-17 | Disposition: A | Discharge status: Home / Self Care | Payer: BC Managed Care – PPO | Source: Ambulatory Visit | Attending: Surgery | Admitting: Surgery

## 2019-04-17 ENCOUNTER — Other Ambulatory Visit: Payer: Self-pay

## 2019-04-17 ENCOUNTER — Ambulatory Visit (HOSPITAL_COMMUNITY)
Admission: RE | Admit: 2019-04-17 | Discharge: 2019-04-17 | Disposition: A | Discharge status: Home / Self Care | Payer: BC Managed Care – PPO | Attending: Surgery | Admitting: Surgery

## 2019-04-17 ENCOUNTER — Ambulatory Visit (HOSPITAL_COMMUNITY): Payer: BC Managed Care – PPO | Admitting: Anesthesiology

## 2019-04-17 ENCOUNTER — Encounter (HOSPITAL_COMMUNITY)
Admission: RE | Disposition: A | Discharge status: Home / Self Care | Payer: Self-pay | Source: Home / Self Care | Attending: Surgery

## 2019-04-17 ENCOUNTER — Ambulatory Visit (HOSPITAL_COMMUNITY): Payer: BC Managed Care – PPO | Admitting: Vascular Surgery

## 2019-04-17 DIAGNOSIS — R519 Headache, unspecified: Secondary | ICD-10-CM | POA: Diagnosis not present

## 2019-04-17 DIAGNOSIS — D509 Iron deficiency anemia, unspecified: Secondary | ICD-10-CM | POA: Insufficient documentation

## 2019-04-17 DIAGNOSIS — C50912 Malignant neoplasm of unspecified site of left female breast: Secondary | ICD-10-CM

## 2019-04-17 DIAGNOSIS — Z803 Family history of malignant neoplasm of breast: Secondary | ICD-10-CM | POA: Diagnosis not present

## 2019-04-17 DIAGNOSIS — Z90710 Acquired absence of both cervix and uterus: Secondary | ICD-10-CM | POA: Insufficient documentation

## 2019-04-17 DIAGNOSIS — D0512 Intraductal carcinoma in situ of left breast: Secondary | ICD-10-CM | POA: Diagnosis not present

## 2019-04-17 DIAGNOSIS — K219 Gastro-esophageal reflux disease without esophagitis: Secondary | ICD-10-CM | POA: Insufficient documentation

## 2019-04-17 DIAGNOSIS — F419 Anxiety disorder, unspecified: Secondary | ICD-10-CM | POA: Diagnosis not present

## 2019-04-17 DIAGNOSIS — Z6841 Body Mass Index (BMI) 40.0 and over, adult: Secondary | ICD-10-CM | POA: Insufficient documentation

## 2019-04-17 DIAGNOSIS — M1990 Unspecified osteoarthritis, unspecified site: Secondary | ICD-10-CM | POA: Insufficient documentation

## 2019-04-17 DIAGNOSIS — G4730 Sleep apnea, unspecified: Secondary | ICD-10-CM | POA: Insufficient documentation

## 2019-04-17 DIAGNOSIS — G4733 Obstructive sleep apnea (adult) (pediatric): Secondary | ICD-10-CM | POA: Insufficient documentation

## 2019-04-17 DIAGNOSIS — I10 Essential (primary) hypertension: Secondary | ICD-10-CM | POA: Diagnosis not present

## 2019-04-17 DIAGNOSIS — E039 Hypothyroidism, unspecified: Secondary | ICD-10-CM | POA: Diagnosis not present

## 2019-04-17 DIAGNOSIS — K449 Diaphragmatic hernia without obstruction or gangrene: Secondary | ICD-10-CM | POA: Insufficient documentation

## 2019-04-17 HISTORY — PX: BREAST LUMPECTOMY WITH RADIOACTIVE SEED AND SENTINEL LYMPH NODE BIOPSY: SHX6550

## 2019-04-17 SURGERY — BREAST LUMPECTOMY WITH RADIOACTIVE SEED AND SENTINEL LYMPH NODE BIOPSY
Anesthesia: Regional | Site: Breast | Laterality: Left

## 2019-04-17 MED ORDER — OXYCODONE HCL 5 MG PO TABS: 5.0000 mg | ORAL_TABLET | Freq: Once | ORAL | Status: DC | PRN

## 2019-04-17 MED ORDER — PROPOFOL 1000 MG/100ML IV EMUL
INTRAVENOUS | Status: AC
  Filled 2019-04-17: qty 100

## 2019-04-17 MED ORDER — PHENYLEPHRINE HCL-NACL 10-0.9 MG/250ML-% IV SOLN
INTRAVENOUS | Status: DC | PRN
  Administered 2019-04-17: 25 ug/min via INTRAVENOUS

## 2019-04-17 MED ORDER — PROPOFOL 10 MG/ML IV BOLUS
INTRAVENOUS | Status: AC
  Filled 2019-04-17: qty 20

## 2019-04-17 MED ORDER — 0.9 % SODIUM CHLORIDE (POUR BTL) OPTIME
TOPICAL | Status: DC | PRN
  Administered 2019-04-17: 1000 mL

## 2019-04-17 MED ORDER — ACETAMINOPHEN 500 MG PO TABS
ORAL_TABLET | ORAL | Status: AC
  Administered 2019-04-17: 1000 mg via ORAL
  Filled 2019-04-17: qty 2

## 2019-04-17 MED ORDER — CHLORHEXIDINE GLUCONATE CLOTH 2 % EX PADS: 6.0000 | MEDICATED_PAD | Freq: Once | CUTANEOUS | Status: DC

## 2019-04-17 MED ORDER — GABAPENTIN 300 MG PO CAPS
ORAL_CAPSULE | ORAL | Status: AC
  Administered 2019-04-17: 300 mg via ORAL
  Filled 2019-04-17: qty 1

## 2019-04-17 MED ORDER — ACETAMINOPHEN 500 MG PO TABS: 1000.0000 mg | ORAL_TABLET | ORAL | Status: AC

## 2019-04-17 MED ORDER — ACETAMINOPHEN 10 MG/ML IV SOLN: 1000.0000 mg | Freq: Once | INTRAVENOUS | Status: DC | PRN

## 2019-04-17 MED ORDER — FENTANYL CITRATE (PF) 100 MCG/2ML IJ SOLN: 50.0000 ug | Freq: Once | INTRAMUSCULAR | Status: AC

## 2019-04-17 MED ORDER — SODIUM CHLORIDE (PF) 0.9 % IJ SOLN
INTRAMUSCULAR | Status: AC
  Filled 2019-04-17: qty 10

## 2019-04-17 MED ORDER — DEXAMETHASONE SODIUM PHOSPHATE 10 MG/ML IJ SOLN
INTRAMUSCULAR | Status: AC
  Filled 2019-04-17: qty 1

## 2019-04-17 MED ORDER — BUPIVACAINE HCL (PF) 0.25 % IJ SOLN
INTRAMUSCULAR | Status: AC
  Filled 2019-04-17: qty 30

## 2019-04-17 MED ORDER — SODIUM CHLORIDE (PF) 0.9 % IJ SOLN
INTRAVENOUS | Status: DC | PRN
  Administered 2019-04-17: 5 mL via INTRAMUSCULAR

## 2019-04-17 MED ORDER — ENSURE PRE-SURGERY PO LIQD: 296.0000 mL | Freq: Once | ORAL | Status: DC

## 2019-04-17 MED ORDER — ROCURONIUM BROMIDE 10 MG/ML (PF) SYRINGE
PREFILLED_SYRINGE | INTRAVENOUS | Status: AC
  Filled 2019-04-17: qty 30

## 2019-04-17 MED ORDER — LACTATED RINGERS IV SOLN: INTRAVENOUS | Status: DC

## 2019-04-17 MED ORDER — FENTANYL CITRATE (PF) 100 MCG/2ML IJ SOLN: 25.0000 ug | INTRAMUSCULAR | Status: DC | PRN

## 2019-04-17 MED ORDER — EPHEDRINE SULFATE-NACL 50-0.9 MG/10ML-% IV SOSY
PREFILLED_SYRINGE | INTRAVENOUS | Status: DC | PRN
  Administered 2019-04-17: 10 mg via INTRAVENOUS

## 2019-04-17 MED ORDER — ACETAMINOPHEN 500 MG PO TABS: 1000.0000 mg | ORAL_TABLET | Freq: Once | ORAL | Status: DC | PRN

## 2019-04-17 MED ORDER — EPHEDRINE 5 MG/ML INJ
INTRAVENOUS | Status: AC
  Filled 2019-04-17: qty 20

## 2019-04-17 MED ORDER — BUPIVACAINE HCL (PF) 0.25 % IJ SOLN
INTRAMUSCULAR | Status: DC | PRN
  Administered 2019-04-17: 20 mL

## 2019-04-17 MED ORDER — OXYCODONE HCL 5 MG/5ML PO SOLN: 5.0000 mg | Freq: Once | ORAL | Status: DC | PRN

## 2019-04-17 MED ORDER — ACETAMINOPHEN 160 MG/5ML PO SOLN: 1000.0000 mg | Freq: Once | ORAL | Status: DC | PRN

## 2019-04-17 MED ORDER — FENTANYL CITRATE (PF) 250 MCG/5ML IJ SOLN
INTRAMUSCULAR | Status: AC
  Filled 2019-04-17: qty 5

## 2019-04-17 MED ORDER — PHENYLEPHRINE 40 MCG/ML (10ML) SYRINGE FOR IV PUSH (FOR BLOOD PRESSURE SUPPORT)
PREFILLED_SYRINGE | INTRAVENOUS | Status: AC
  Filled 2019-04-17: qty 20

## 2019-04-17 MED ORDER — MIDAZOLAM HCL 2 MG/2ML IJ SOLN
INTRAMUSCULAR | Status: AC
  Administered 2019-04-17: 1 mg via INTRAVENOUS
  Filled 2019-04-17: qty 2

## 2019-04-17 MED ORDER — PROPOFOL 10 MG/ML IV BOLUS
INTRAVENOUS | Status: DC | PRN
  Administered 2019-04-17: 200 mg via INTRAVENOUS

## 2019-04-17 MED ORDER — BUPIVACAINE HCL (PF) 0.5 % IJ SOLN
INTRAMUSCULAR | Status: DC | PRN
  Administered 2019-04-17: 30 mL via PERINEURAL

## 2019-04-17 MED ORDER — ONDANSETRON HCL 4 MG/2ML IJ SOLN
INTRAMUSCULAR | Status: DC | PRN
  Administered 2019-04-17: 4 mg via INTRAVENOUS

## 2019-04-17 MED ORDER — GABAPENTIN 300 MG PO CAPS: 300.0000 mg | ORAL_CAPSULE | ORAL | Status: AC

## 2019-04-17 MED ORDER — METHYLENE BLUE 0.5 % INJ SOLN
INTRAVENOUS | Status: AC
  Filled 2019-04-17: qty 10

## 2019-04-17 MED ORDER — DEXAMETHASONE SODIUM PHOSPHATE 10 MG/ML IJ SOLN
INTRAMUSCULAR | Status: DC | PRN
  Administered 2019-04-17 (×4): 10 mg via INTRAVENOUS

## 2019-04-17 MED ORDER — TECHNETIUM TC 99M SULFUR COLLOID FILTERED
1.0000 | Freq: Once | INTRAVENOUS | Status: AC | PRN
  Administered 2019-04-17: 1 via INTRADERMAL

## 2019-04-17 MED ORDER — HYDROCODONE-ACETAMINOPHEN 5-325 MG PO TABS: 1.0000 | ORAL_TABLET | Freq: Four times a day (QID) | ORAL | 0 refills | Status: DC | PRN

## 2019-04-17 MED ORDER — LIDOCAINE 2% (20 MG/ML) 5 ML SYRINGE
INTRAMUSCULAR | Status: AC
  Filled 2019-04-17: qty 5

## 2019-04-17 MED ORDER — FENTANYL CITRATE (PF) 100 MCG/2ML IJ SOLN
INTRAMUSCULAR | Status: AC
  Administered 2019-04-17: 50 ug via INTRAVENOUS
  Filled 2019-04-17: qty 2

## 2019-04-17 MED ORDER — INDIGOTINDISULFONATE SODIUM 8 MG/ML IJ SOLN
5.0000 mL | INTRAMUSCULAR | Status: DC
  Filled 2019-04-17: qty 5

## 2019-04-17 MED ORDER — ONDANSETRON HCL 4 MG/2ML IJ SOLN
INTRAMUSCULAR | Status: AC
  Filled 2019-04-17: qty 2

## 2019-04-17 MED ORDER — PHENYLEPHRINE 40 MCG/ML (10ML) SYRINGE FOR IV PUSH (FOR BLOOD PRESSURE SUPPORT)
PREFILLED_SYRINGE | INTRAVENOUS | Status: DC | PRN
  Administered 2019-04-17: 40 ug via INTRAVENOUS
  Administered 2019-04-17: 80 ug via INTRAVENOUS

## 2019-04-17 MED ORDER — LIDOCAINE 2% (20 MG/ML) 5 ML SYRINGE
INTRAMUSCULAR | Status: DC | PRN
  Administered 2019-04-17: 100 mg via INTRAVENOUS

## 2019-04-17 MED ORDER — MIDAZOLAM HCL 2 MG/2ML IJ SOLN: 1.0000 mg | Freq: Once | INTRAMUSCULAR | Status: AC

## 2019-04-17 MED ORDER — FENTANYL CITRATE (PF) 250 MCG/5ML IJ SOLN
INTRAMUSCULAR | Status: DC | PRN
  Administered 2019-04-17: 75 ug via INTRAVENOUS
  Administered 2019-04-17 (×2): 25 ug via INTRAVENOUS
  Administered 2019-04-17: 50 ug via INTRAVENOUS
  Administered 2019-04-17: 75 ug via INTRAVENOUS

## 2019-04-17 SURGICAL SUPPLY — 48 items
ADH SKN CLS APL DERMABOND .7 (GAUZE/BANDAGES/DRESSINGS)
APL PRP STRL LF DISP 70% ISPRP (MISCELLANEOUS) ×1
APL SKNCLS STERI-STRIP NONHPOA (GAUZE/BANDAGES/DRESSINGS) ×2
APPLIER CLIP 9.375 MED OPEN (MISCELLANEOUS) ×2
APR CLP MED 9.3 20 MLT OPN (MISCELLANEOUS) ×1
BENZOIN TINCTURE PRP APPL 2/3 (GAUZE/BANDAGES/DRESSINGS) ×4 IMPLANT
BINDER BREAST XXLRG (GAUZE/BANDAGES/DRESSINGS) ×2 IMPLANT
CANISTER SUCT 3000ML PPV (MISCELLANEOUS) ×2 IMPLANT
CHLORAPREP W/TINT 26 (MISCELLANEOUS) ×2 IMPLANT
CLIP APPLIE 9.375 MED OPEN (MISCELLANEOUS) ×1 IMPLANT
CLSR STERI-STRIP ANTIMIC 1/2X4 (GAUZE/BANDAGES/DRESSINGS) ×4 IMPLANT
CNTNR URN SCR LID CUP LEK RST (MISCELLANEOUS) ×2 IMPLANT
CONT SPEC 4OZ STRL OR WHT (MISCELLANEOUS) ×4
COVER PROBE W GEL 5X96 (DRAPES) ×2 IMPLANT
COVER SURGICAL LIGHT HANDLE (MISCELLANEOUS) ×2 IMPLANT
COVER WAND RF STERILE (DRAPES) IMPLANT
DERMABOND ADVANCED (GAUZE/BANDAGES/DRESSINGS)
DERMABOND ADVANCED .7 DNX12 (GAUZE/BANDAGES/DRESSINGS) IMPLANT
DEVICE DUBIN SPECIMEN MAMMOGRA (MISCELLANEOUS) ×2 IMPLANT
DRAPE CHEST BREAST 15X10 FENES (DRAPES) ×2 IMPLANT
DRSG TEGADERM 4X4.75 (GAUZE/BANDAGES/DRESSINGS) ×4 IMPLANT
ELECT CAUTERY BLADE 6.4 (BLADE) ×2 IMPLANT
ELECT REM PT RETURN 9FT ADLT (ELECTROSURGICAL) ×2
ELECTRODE REM PT RTRN 9FT ADLT (ELECTROSURGICAL) ×1 IMPLANT
GAUZE SPONGE 2X2 8PLY STRL LF (GAUZE/BANDAGES/DRESSINGS) ×2 IMPLANT
GLOVE BIO SURGEON STRL SZ7 (GLOVE) ×2 IMPLANT
GLOVE BIOGEL PI IND STRL 7.5 (GLOVE) ×1 IMPLANT
GLOVE BIOGEL PI INDICATOR 7.5 (GLOVE) ×1
GOWN STRL REUS W/ TWL LRG LVL3 (GOWN DISPOSABLE) ×2 IMPLANT
GOWN STRL REUS W/TWL LRG LVL3 (GOWN DISPOSABLE) ×4
KIT BASIN OR (CUSTOM PROCEDURE TRAY) ×2 IMPLANT
KIT MARKER MARGIN INK (KITS) IMPLANT
LIGHT WAVEGUIDE WIDE FLAT (MISCELLANEOUS) ×2 IMPLANT
NEEDLE 18GX1X1/2 (RX/OR ONLY) (NEEDLE) ×2 IMPLANT
NEEDLE FILTER BLUNT 18X 1/2SAF (NEEDLE) ×1
NEEDLE FILTER BLUNT 18X1 1/2 (NEEDLE) ×1 IMPLANT
NEEDLE HYPO 25GX1X1/2 BEV (NEEDLE) ×4 IMPLANT
NS IRRIG 1000ML POUR BTL (IV SOLUTION) ×2 IMPLANT
PACK GENERAL/GYN (CUSTOM PROCEDURE TRAY) ×2 IMPLANT
PAD ABD 8X10 STRL (GAUZE/BANDAGES/DRESSINGS) ×4 IMPLANT
SPONGE GAUZE 2X2 STER 10/PKG (GAUZE/BANDAGES/DRESSINGS) ×2
SPONGE LAP 4X18 RFD (DISPOSABLE) ×2 IMPLANT
SUT MNCRL AB 4-0 PS2 18 (SUTURE) ×4 IMPLANT
SUT VIC AB 3-0 SH 27 (SUTURE) ×6
SUT VIC AB 3-0 SH 27X BRD (SUTURE) ×3 IMPLANT
SYR CONTROL 10ML LL (SYRINGE) ×4 IMPLANT
TOWEL GREEN STERILE (TOWEL DISPOSABLE) ×2 IMPLANT
TOWEL GREEN STERILE FF (TOWEL DISPOSABLE) ×2 IMPLANT

## 2019-04-17 NOTE — Interval H&P Note (Signed)
History and Physical Interval Note:  04/17/2019 2:06 PM  Loretta Nelson  has presented today for surgery, with the diagnosis of [place].  The various methods of treatment have been discussed with the patient and family. After consideration of risks, benefits and other options for treatment, the patient has consented to  Procedure(s) with comments: LEFT BREAST LUMPECTOMY WITH RADIOACTIVE SEED AND SENTINEL LYMPH NODE BIOPSY (Left) - LMA, PEC BLOCK as a surgical intervention.  The patient's history has been reviewed, patient examined, no change in status, stable for surgery.  I have reviewed the patient's chart and labs.  Questions were answered to the patient's satisfaction.     Maia Petties

## 2019-04-17 NOTE — Discharge Instructions (Signed)
[facility] °Office Phone Number [phone] ° °BREAST BIOPSY/ PARTIAL MASTECTOMY: POST OP INSTRUCTIONS ° °Always review your discharge instruction sheet given to you by the facility where your surgery was performed. ° °IF YOU HAVE DISABILITY OR FAMILY LEAVE FORMS, YOU MUST BRING THEM TO THE OFFICE FOR PROCESSING.  DO NOT GIVE THEM TO YOUR DOCTOR. ° °1. A prescription for pain medication may be given to you upon discharge.  Take your pain medication as prescribed, if needed.  If narcotic pain medicine is not needed, then you may take acetaminophen (Tylenol) or ibuprofen (Advil) as needed. °2. Take your usually prescribed medications unless otherwise directed °3. If you need a refill on your pain medication, please contact your pharmacy.  They will contact our office to request authorization.  Prescriptions will not be filled after 5pm or on week-ends. °4. You should eat very light the first 24 hours after surgery, such as soup, crackers, pudding, etc.  Resume your normal diet the day after surgery. °5. Most patients will experience some swelling and bruising in the breast.  Ice packs and a good support bra will help.  Swelling and bruising can take several days to resolve.  °6. It is common to experience some constipation if taking pain medication after surgery.  Increasing fluid intake and taking a stool softener will usually help or prevent this problem from occurring.  A mild laxative (Milk of Magnesia or Miralax) should be taken according to package directions if there are no bowel movements after 48 hours. °7. Unless discharge instructions indicate otherwise, you may remove your bandages 24-48 hours after surgery, and you may shower at that time.  You may have steri-strips (small skin tapes) in place directly over the incision.  These strips should be left on the skin for 7-10 days.  If your surgeon used skin glue on the incision, you may shower in 24 hours.  The glue will flake off over the  next 2-3 weeks.  Any sutures or staples will be removed at the office during your follow-up visit. °8. ACTIVITIES:  You may resume regular daily activities (gradually increasing) beginning the next day.  Wearing a good support bra or sports bra minimizes pain and swelling.  You may have sexual intercourse when it is comfortable. °a. You may drive when you no longer are taking prescription pain medication, you can comfortably wear a seatbelt, and you can safely maneuver your car and apply brakes. °b. RETURN TO WORK:  ______________________________________________________________________________________ °9. You should see your doctor in the office for a follow-up appointment approximately two weeks after your surgery.  Your doctor’s nurse will typically make your follow-up appointment when she calls you with your pathology report.  Expect your pathology report 2-3 business days after your surgery.  You may call to check if you do not hear from us after three days. °10. OTHER INSTRUCTIONS: _______________________________________________________________________________________________ _____________________________________________________________________________________________________________________________________ °_____________________________________________________________________________________________________________________________________ °_____________________________________________________________________________________________________________________________________ ° °WHEN TO CALL YOUR DOCTOR: °1. Fever over 101.0 °2. Nausea and/or vomiting. °3. Extreme swelling or bruising. °4. Continued bleeding from incision. °5. Increased pain, redness, or drainage from the incision. ° °The clinic staff is available to answer your questions during regular business hours.  Please don’t hesitate to call and ask to speak to one of the nurses for clinical concerns.  If you have a medical emergency, go to the nearest  emergency room or call 911.  A surgeon from [facility] is always on call at the hospital. ° °For further questions, please visit [email or web address]  °

## 2019-04-17 NOTE — Transfer of Care (Signed)
Immediate Anesthesia Transfer of Care Note  Patient: Loretta Nelson  Procedure(s) Performed: LEFT BREAST LUMPECTOMY WITH RADIOACTIVE SEED AND SENTINEL LYMPH NODE BIOPSY (Left Breast)  Patient Location: PACU  Anesthesia Type:General  Level of Consciousness: awake, drowsy and patient cooperative  Airway & Oxygen Therapy: Patient Spontanous Breathing and Patient connected to face mask oxygen  Post-op Assessment: Report given to RN and Post -op Vital signs reviewed and stable  Post vital signs: Reviewed and stable  Last Vitals:  Vitals Value Taken Time  BP 129/99 04/17/19 1646  Temp    Pulse 75 04/17/19 1650  Resp 22 04/17/19 1650  SpO2 94 % 04/17/19 1650  Vitals shown include unvalidated device data.  Last Pain:  Vitals:   04/17/19 1234  TempSrc:   PainSc: 0-No pain      Patients Stated Pain Goal: 3 ([id number])  Complications: No apparent anesthesia complications

## 2019-04-17 NOTE — Anesthesia Procedure Notes (Signed)
Procedure Name: LMA Insertion Date/Time: 04/17/2019 2:45 PM Performed by: Renato Shin, CRNA Pre-anesthesia Checklist: Patient identified, Emergency Drugs available, Suction available and Patient being monitored Patient Re-evaluated:Patient Re-evaluated prior to induction Oxygen Delivery Method: Circle system utilized Preoxygenation: Pre-oxygenation with 100% oxygen Induction Type: IV induction LMA: LMA inserted LMA Size: 4.0 Number of attempts: 1 Placement Confirmation: positive ETCO2 and breath sounds checked- equal and bilateral Tube secured with: Tape Dental Injury: Teeth and Oropharynx as per pre-operative assessment

## 2019-04-17 NOTE — Anesthesia Procedure Notes (Signed)
Anesthesia Regional Block: Pectoralis block   Pre-Anesthetic Checklist: ,, timeout performed, Correct Patient, Correct Site, Correct Laterality, Correct Procedure, Correct Position, site marked, Risks and benefits discussed,  Surgical consent,  Pre-op evaluation,  At surgeon's request and post-op pain management  Laterality: Left and Upper  Prep: chloraprep       Needles:  Injection technique: Single-shot     Needle Length: 9cm  Needle Gauge: 22     Additional Needles: Arrow StimuQuik ECHO Echogenic Stimulating PNB Needle  Procedures:,,,, ultrasound used (permanent image in chart),,,,  Narrative:  Start time: 04/17/2019 2:22 PM End time: 04/17/2019 2:28 PM Injection made incrementally with aspirations every 5 mL.  Performed by: Personally  Anesthesiologist: Oleta Mouse, MD

## 2019-04-17 NOTE — Op Note (Signed)
Pre-op Diagnosis: Invasive ductal carcinoma left breast Post-op Diagnosis: same Procedure:  Left radioactive seed localized lumpectomy/ blue dye injection/ left axillary sentinel lymph node biopsy   Surgeon:  Maia Petties. Anesthesia:  GEN/ Pectoral block Indications:  This is a 54 year old female who underwent recent screen mammogram that showed an abnormality in her left breast. Subsequently, she was found to have an 8 mm mass in the left breast at 12:00 10 cmfn. Biopsy revealed IDC Grade 3, ER only 10%, PR-, Her 2 -, Ki67 30%. Axilla was negative.   Description of procedure: The patient is brought to the operating room placed in supine position on the operating room table. A seed was placed yesterday and she was injected with radiotracer in the pre-operative area.  After an adequate level of general anesthesia was obtained, her left breast and axilla were prepped with ChloraPrep and draped in sterile fashion. A timeout was taken to ensure the proper patient and proper procedure.  I first injected methylene blue dye solution around the nipple.  We interrogated the breast with the neoprobe.  The seed is located in upper inner quadrant.   We made a circumareolar incision around the upper side of the nipple after infiltrating with 0.25% Marcaine. Dissection was carried deep down in the breast tissue in the upper breast with cautery. We used the lighted retractors for exposure.  We used the neoprobe to guide [place] towards the radioactive seed. We excised an area of tissue around the radioactive seed 3 cm in diameter. The specimen was removed and was oriented with a paint kit. Specimen mammogram showed the radioactive seed as well as the biopsy clip within the specimen. This was sent for pathologic examination. There is no residual radioactivity within the biopsy cavity. We inspected carefully for hemostasis. The wound was thoroughly irrigated.   We then changed the settings on the neoprobe and  interrogated the axilla.  There is an area of activity in the anterior axilla.  I made a transverse incision across this area after infiltrating with local anesthetic.  We dissected into the axilla and encountered 2 lymph nodes.  These were both radioactive but no sign of blue dye.  These lymph nodes were excised separately and sent as sentinel lymph node #1 and #2.  Hemostasis was obtained with a 3-0 Vicryl suture.  The axilla was irrigated and inspected for hemostasis.    Both wounds were closed with a deep layer of 3-0 Vicryl and a subcuticular layer of 4-0 Monocryl. Benzoin Steri-Strips were applied. The patient was then extubated and brought to the recovery room in stable condition. All sponge, instrument, and needle counts are correct.  Imogene Burn. Georgette Dover, MD, [facility]  General/ Trauma Surgery  04/17/2019 4:33 PM

## 2019-04-18 ENCOUNTER — Telehealth: Payer: Self-pay

## 2019-04-18 ENCOUNTER — Telehealth: Payer: Self-pay | Admitting: *Deleted

## 2019-04-18 ENCOUNTER — Encounter: Payer: Self-pay | Admitting: Physical Therapy

## 2019-04-18 NOTE — Telephone Encounter (Signed)
Left message for a return phone call to follow up and  from [facility].

## 2019-04-18 NOTE — Telephone Encounter (Signed)
Nutrition Assessment  Reason for Assessment:  Pt attended Breast Clinic on 04/10/19 and was given nutrition packet by nurse navigator.  ASSESSMENT:  54 year old female with new diagnosis of breast cancer.  Past medical history reviewed.  Patient s/p lumpectomy, oncotype, likely chemo and radiation.   Spoke with patient via phone to introduce self and service at [facility].  Patient reports that she has been reading nutrition packet of information  Medications:  reviewed  Labs: reviewed  Anthropometrics:   Height: 64 inches Weight: 303 lb BMI: 52   NUTRITION DIAGNOSIS: Food and nutrition related knowledge deficit related to new diagnosis of breast cancer as evidenced by no prior need for nutrition related information.  INTERVENTION:   Discussed briefly packet of information regarding nutritional tips for breast cancer patients. Patient wanted to be added to mailing list to receive calendar of events at [facility]  Contact information provided and patient knows to contact me with questions/concerns.    MONITORING, EVALUATION, and GOAL: Pt will consume a healthy plant based diet to maintain lean body mass throughout treatment.   Finlee Concepcion B. Zenia Resides, [place], [place] Registered Dietitian [phone] (pager)

## 2019-04-18 NOTE — Anesthesia Postprocedure Evaluation (Signed)
Anesthesia Post Note  Patient: Loretta Nelson  Procedure(s) Performed: LEFT BREAST LUMPECTOMY WITH RADIOACTIVE SEED AND SENTINEL LYMPH NODE BIOPSY (Left Breast)     Patient location during evaluation: PACU Anesthesia Type: Regional Level of consciousness: awake and alert Pain management: pain level controlled Vital Signs Assessment: post-procedure vital signs reviewed and stable Respiratory status: spontaneous breathing, nonlabored ventilation, respiratory function stable and patient connected to nasal cannula oxygen Cardiovascular status: blood pressure returned to baseline and stable Postop Assessment: no apparent nausea or vomiting Anesthetic complications: no    Last Vitals:  Vitals:   04/17/19 1715 04/17/19 1730  BP: 140/73 (!) 145/71  Pulse: 71 71  Resp: 14 16  Temp: 37 C   SpO2: 95% 93%    Last Pain:  Vitals:   04/18/19 1421  TempSrc:   PainSc: 2                  Tiajuana Amass

## 2019-04-19 ENCOUNTER — Ambulatory Visit: Payer: Self-pay | Admitting: Licensed Clinical Social Worker

## 2019-04-19 ENCOUNTER — Telehealth: Payer: Self-pay | Admitting: Licensed Clinical Social Worker

## 2019-04-19 DIAGNOSIS — Z1501 Genetic susceptibility to malignant neoplasm of breast: Secondary | ICD-10-CM

## 2019-04-19 DIAGNOSIS — C50412 Malignant neoplasm of upper-outer quadrant of left female breast: Secondary | ICD-10-CM

## 2019-04-19 DIAGNOSIS — Z170 Estrogen receptor positive status [ER+]: Secondary | ICD-10-CM

## 2019-04-19 DIAGNOSIS — Z1379 Encounter for other screening for genetic and chromosomal anomalies: Secondary | ICD-10-CM

## 2019-04-19 DIAGNOSIS — Z803 Family history of malignant neoplasm of breast: Secondary | ICD-10-CM

## 2019-04-19 HISTORY — DX: Genetic susceptibility to malignant neoplasm of breast: Z15.01

## 2019-04-19 NOTE — Telephone Encounter (Signed)
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 We discussed this gene in detail, including cancers associated, management guidelines, and made plans for testing relatives.  

## 2019-04-19 NOTE — Progress Notes (Signed)
Genetic Test Results  HPI:  Loretta Nelson was previously seen in the [facility] due to a personal and family history of cancer and concerns regarding a hereditary predisposition to cancer. Please refer to our prior cancer genetics clinic note for more information regarding our discussion, assessment and recommendations, at the time. Loretta Nelson recent genetic test results were disclosed to her, as were recommendations warranted by these results. These results and recommendations are discussed in more detail below.  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 The report date is 04/19/2019. Remainder of testing was normal.   The STAT Breast cancer panel offered by [facility] includes sequencing and rearrangement analysis for the following 9 genes:  ATM, BRCA1, BRCA2, CDH1, CHEK2, PALB2, PTEN, STK11 and TP53.    The Common Hereditary Cancers Panel offered by [facility] includes sequencing and/or deletion duplication testing of the following 48 genes: APC, ATM, AXIN2, BARD1, BMPR1A, BRCA1, BRCA2, BRIP1, CDH1, CDKN2A (p14ARF), CDKN2A (p16INK4a), CKD4, CHEK2, CTNNA1, DICER1, EPCAM (Deletion/duplication testing only), GREM1 (promoter region deletion/duplication testing only), KIT, MEN1, MLH1, MSH2, MSH3, MSH6, MUTYH, NBN, NF1, NHTL1, PALB2, PDGFRA, PMS2, POLD1, POLE, PTEN, RAD50, RAD51C, RAD51D, RNF43, SDHB, SDHC, SDHD, SMAD4, SMARCA4. STK11, TP53, TSC1, TSC2, and VHL.  The following genes were evaluated for sequence changes only: SDHA and HOXB13 c.251G>A variant only.   05/09/2019 -  Chemotherapy   The  patient had PALONOSETRON HCL INJECTION 0.25 MG/5ML, 0.25 mg, Intravenous,  Once, 0 of 4 cycles pegfilgrastim-jmdb (FULPHILA) injection 6 mg, 6 mg, Subcutaneous,  Once, 0 of 4 cycles cyclophosphamide (CYTOXAN) 1,500 mg in sodium chloride 0.9 % 250 mL chemo infusion, 600 mg/m2, Intravenous,  Once, 0 of 4 cycles DOCEtaxel (TAXOTERE) 190 mg in sodium chloride 0.9 % 500 mL chemo infusion, 75 mg/m2, Intravenous,  Once, 0 of 4 cycles  for chemotherapy treatment.      FAMILY HISTORY:  We obtained a detailed, 4-generation family history.  Significant diagnoses are listed below: Family History  Problem Relation Age of Onset  . Hypertension Mother   . CAD Maternal Grandfather   . Breast cancer Paternal Grandmother   . HIV Brother   . Heart attack Brother        sudden MI vs PE  . Breast cancer Sister 84  . Breast cancer Other        one dx 18s, others dx in 63s  . Colon polyps Neg Hx   . Colon cancer Neg Hx   . Liver disease Neg Hx    Loretta Nelson has 2 daughters, they are fraternal twins, age 62, no cancer history. Patient had 2 sisters and 2 brothers. Both brothers are deceased, no cancer history. One of her sisters had breast cancer at 16 and is living at 66, no genetic testing that she is aware of.   Loretta Nelson mother is living at 31 with no history of cancer. Patient has 1 maternal uncle, no cancers. No known cancers in her 2 maternal first cousins. Maternal grandmother died at 76 in a car accident. Grandfather had passed before she was born and she does not have information about him.  Loretta Nelson father  died in his late 79s, she has limited information about him. Patient had 2 paternal aunts, 2 paternal uncles. No known cancers for these individuals. No known cancers in paternal first cousins. She does have 3 paternal second cousins who had breast cancer, one diagnosed in her 28s, the other two in their 2s. Paternal grandmother had breast cancer, unsure age of diagnosis, died at 21.  Paternal grandfather died in his 13s, limited information.   Loretta Nelson is unaware of previous family history of genetic testing for hereditary cancer risks. Patient's maternal ancestors are of African American descent, and paternal ancestors are of African American descent. There is no reported Ashkenazi Jewish ancestry. There is no known consanguinity.  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 The remainder of testing was negative.  The STAT Breast cancer panel offered by [facility] includes sequencing and rearrangement analysis for the following 9 genes:  ATM, BRCA1, BRCA2, CDH1, CHEK2, PALB2, PTEN, STK11 and TP53.    The Common Hereditary Cancers Panel offered by [facility] includes sequencing and/or deletion duplication testing of the following 48 genes: APC, ATM, AXIN2, BARD1, BMPR1A, BRCA1, BRCA2, BRIP1, CDH1, CDKN2A (p14ARF), CDKN2A (p16INK4a), CKD4, CHEK2, CTNNA1, DICER1, EPCAM (Deletion/duplication testing only), GREM1 (promoter region deletion/duplication testing only), KIT, MEN1, MLH1, MSH2, MSH3, MSH6, MUTYH, NBN, NF1, NHTL1, PALB2, PDGFRA, PMS2, POLD1, POLE, PTEN, RAD50, RAD51C, RAD51D, RNF43, SDHB, SDHC, SDHD, SMAD4, SMARCA4. STK11, TP53, TSC1, TSC2, and VHL.  The following genes were evaluated for sequence changes only: SDHA and HOXB13 c.251G>A variant only.  The test report has been scanned into EPIC and is located under the Molecular Pathology section of the Results Review tab.  A portion of the result report is included below for reference.      DISCUSSION: BARD1  Clinical condition Women who are carriers of a single pathogenic BARD1 variant have an increased risk for breast cancer, although specific risks are not yet determined (PMID: 37342876 [id number] ). There may also be an increased risk for ovarian cancer,  but the current evidence is preliminary (PMID: 03559741 [id number] ). An individual with a BARD1 pathogenic variant will not necessarily develop cancer in her lifetime, but the risk for cancer is increased over the general population risk.  Gene information The BARD1 gene forms a BRCA1-BARD1 heterodimer and coordinates a diverse range of cellular pathways, such as DNA damage repair, ubiquitination, and transcriptional regulation to maintain genomic stability. It is believed to play a central role in the control of the cell cycle in response to DNA damage [employer], UniProt[employer] - [place] Accessed September 2015). If there is a pathogenic variant in this gene that prevents it from functioning normally, the risk of developing certain types of cancers is increased.  Inheritance Hereditary predisposition to cancer due to pathogenic variants in the BARD1 gene has autosomal dominant inheritance. This means that an individual with a pathogenic variant has a 50% chance of passing the condition on to their offspring. Once a pathogenic mutation is detected in an individual, it is possible to identify at-risk relatives who can pursue testing for this specific familial variant. Many cases are inherited from a parent, but some cases can occur spontaneously (i.e., an individual with a pathogenic variant has parents who do not have it). An individual with a variant in [place] has a 50% risk of passing that variant on to offspring.  Management Having a single pathogenic variant in [place] has been associated with increased risk for breast cancer, with evidence for increased risk for triple negative disease, although specific risks are not yet determined. There may also be an increased risk for ovarian cancer, but the current evidence is preliminary and insufficient to make medical management recommendations. This is a gene for which there is limited data and evidence, however in the future as we  learn more management recommendations and more information about this gene may emerge.    From NCCN Breast, Ovarian and Pancreatic v.2.2021:    This result likely explains Loretta Nelson's personal and family history of cancer. Testing other family members will be helpful in providing more evidence that this is the cause for the cancers in the family.   An individual's cancer risk and medical management are not determined by genetic test results alone. Overall cancer risk assessment incorporates additional factors, including personal medical history, family history, and any available genetic information that may result in a personalized plan for cancer prevention and surveillance.  Even though the data regarding specific cancer risk estimates with pathogenic BARD1 variants are limited, knowing if a pathogenic variant is present is advantageous. At-risk relatives can be identified, enabling pursuit of a diagnostic evaluation. Further, the available information regarding hereditary cancer susceptibility genes is constantly evolving and more clinically relevant data regarding BARD1 are likely to become available in the near future. Awareness of this cancer predisposition encourages patients and their providers to inform at-risk family members, to diligently follow standard screening protocols, and to be vigilant in maintaining close and regular contact with their local genetics clinic in anticipation of new information.   FAMILY MEMBERS: It is important that all of Loretta Nelson's relatives (both men and women) know of the presence of this gene mutation. Site-specific genetic testing can sort out who in the family is at risk and who is not.   Loretta Nelson children and siblings have a 50% chance to have inherited this mutation. We recommend they have genetic testing for this same mutation, as identifying the presence of this mutation would allow them to also take advantage of risk-reducing measures.   PLAN:     1. These results will be made available to her care team, Dr. Jana Hakim, Dr. Georgette Dover and Dr. Lisbeth Renshaw, as well as her PCP Dr. Dema Severin. She would like these providers to follow her long-term for this indication and coordinate screening.   2. Loretta Nelson plans to discuss these results with her family and will reach out to [place] if we can be of any assistance in coordinating genetic testing for any of her relatives. Her daughters and sisters are in [place] and she will give them my number to schedule testing. She gives [place] permission to discuss her test results with daughters Glade Lloyd and [place], and sisters Harriet Sutphen and Madelon Lips. The mutation is likely coming from her father's side of the family, so she knows we could test paternal relatives and/or her mother to help confirm this.    SUPPORT AND RESOURCES: If Loretta Nelson is interested in BRCA-specific information and support, there are two groups, Facing Our Risk (www.facingourrisk.com) and Bright Pink (www.brightpink.org) which some people have found useful. They provide opportunities to speak with other individuals from high-risk families. To locate genetic counselors in other cities, visit the website of the [employer] (ArtistMovie.se) and Secretary/administrator for a Social worker by zip code.  We encouraged Loretta Nelson to remain in contact  with [place] on an annual basis so we can update her personal and family histories, and let her know of advances in cancer genetics that may benefit the family. Our contact number was provided. Loretta Nelson questions were answered to her satisfaction today, and she knows she is welcome to call anytime with additional questions.   Faith Rogue, MS, [facility] Genetic Counselor [email or web address] Phone: [phone]

## 2019-04-23 ENCOUNTER — Encounter: Payer: Self-pay | Admitting: Physical Therapy

## 2019-04-24 NOTE — Addendum Note (Signed)
Addended by: Star Age on: 04/24/2019 09:10 AM   Modules accepted: Orders

## 2019-04-24 NOTE — Procedures (Signed)
PATIENT'S NAME:  Loretta Nelson, [place] DOB:      05-Jan-1966      MR#:    [id number]     DATE OF RECORDING: 04/15/2019 REFERRING M.D.:  [name], MD Study Performed:   CPAP  Titration HISTORY:  54 year old woman with a history of hypertension, vitamin D deficiency, Graves' disease with status post radioactive iodine, reflux disease, arthritis, anxiety, anemia, allergic rhinitis, hypothyroidism, and morbid obesity with a BMI of over 50, who presents for a full night titration study. Her baseline sleep study from 03/15/19 showed severe obstructive sleep apnea, with a total AHI of 57.4/hour, REM AHI of 98.6/hour, supine AHI of 53/hour and O2 nadir of 73%. The patient endorsed the Epworth Sleepiness Scale at 15/24 points. The patient's weight 310 pounds with a height of 64 (inches), resulting in a BMI of 53.1 kg/m2. The patient's neck circumference measured 19 inches.  CURRENT MEDICATIONS: Norvasc, Vit D, Celexa, Edarbyclor, Flonase, Advil, Klor-Con, B-12, Synthroid, Claritin,Toprol-XL, Prilosec  PROCEDURE:  This is a multichannel digital polysomnogram utilizing the SomnoStar 11.2 system.  Electrodes and sensors were applied and monitored per AASM Specifications.   EEG, EOG, Chin and Limb EMG, were sampled at 200 Hz.  ECG, Snore and Nasal Pressure, Thermal Airflow, Respiratory Effort, CPAP Flow and Pressure, Oximetry was sampled at 50 Hz. Digital video and audio were recorded.      The patient was fitted with a nasal mask, but indicated, she would prefer using nasal pillows at home. CPAP was initiated at 6 cmH20 with heated humidity per AASM standards and pressure was advanced to 14 cmH20 because of hypopneas, apneas and desaturations.  At a PAP pressure of 14 cmH20, there was a reduction of the AHI to 0.9/hour with supine REM sleep achieved and O2 nadir briefly at 90%.  Lights Out was at 22:38 and Lights On at 04:59. Total recording time (TRT) was 381 minutes, with a total sleep time (TST) of 295.5 minutes.  The patient's sleep latency was 25.5 minutes. REM latency was 148.5 minutes, which is delayed. The sleep efficiency was 77.6%.    SLEEP ARCHITECTURE: WASO (Wake after sleep onset) was 52.5 minutes with mild sleep fragmentation noted. There were 24.5 minutes in Stage N1, 79 minutes Stage N2, 137 minutes Stage N3 and 55 minutes in Stage REM.  The percentage of Stage N1 was 8.3%, Stage N2 was 26.7%, Stage N3 was 46.4%, which is markedly increased and Stage R (REM sleep) was 18.6%, which is near normal. The arousals were noted as: 60 were spontaneous, 10 were associated with PLMs, 16 were associated with respiratory events.  RESPIRATORY ANALYSIS:  There was a total of 43 respiratory events: 0 obstructive apneas, 0 central apneas and 0 mixed apneas with a total of 0 apneas and an apnea index (AI) of 0 /hour. There were 43 hypopneas with a hypopnea index of 8.7/hour. The patient also had 0 respiratory event related arousals (RERAs).      The total APNEA/HYPOPNEA INDEX  (AHI) was 8.7 /hour and the total RESPIRATORY DISTURBANCE INDEX was 8.7 /hour  8 events occurred in REM sleep and 35 events in NREM. The REM AHI was 8.7 /hour versus a non-REM AHI of 8.7 /hour.  The patient spent 295.5 minutes of total sleep time in the supine position and 0 minutes in non-supine. The supine AHI was 8.7, versus a non-supine AHI of 0.0.  OXYGEN SATURATION & C02:  The baseline 02 saturation was 96%, with the lowest being 81%. Time spent below 89% saturation  equaled 22 minutes.  PERIODIC LIMB MOVEMENTS:  The patient had a total of 58 Periodic Limb Movements. The Periodic Limb Movement (PLM) index was 11.8 and the PLM Arousal index was 2. /hour.   Audio and video analysis did not show any abnormal or unusual movements, behaviors, phonations or vocalizations. The patient took no bathroom breaks. The EKG was in keeping with normal sinus rhythm (NSR).  Post-study, the patient indicated that sleep was better than usual.    IMPRESSION: 1. Severe Obstructive Sleep Apnea (OSA) 2. Dysfunctions associated with sleep stages or arousal from sleep   RECOMMENDATIONS: 1. This study demonstrates resolution of the patient's obstructive sleep apnea with CPAP therapy. I will, therefore, start the patient on home CPAP treatment at a pressure of 14 cm via nasal pillows with heated humidity. The patient should be reminded to be fully compliant with PAP therapy to improve sleep related symptoms and decrease long term cardiovascular risks. The patient should be reminded, that it may take up to 3 months to get fully used to using PAP with all planned sleep. The earlier full compliance is achieved, the better long term compliance tends to be. Please note that untreated obstructive sleep apnea may carry additional perioperative morbidity. Patients with significant obstructive sleep apnea should receive perioperative PAP therapy and the surgeons and particularly the anesthesiologist should be informed of the diagnosis and the severity of the sleep disordered breathing. 2. This study shows sleep fragmentation and abnormal sleep stage percentages; these are nonspecific findings and per se do not signify an intrinsic sleep disorder or a cause for the patient's sleep-related symptoms. Causes include (but are not limited to) the first night effect of the sleep study, circadian rhythm disturbances, medication effect or an underlying mood disorder or medical problem.  3. The patient should be cautioned not to drive, work at heights, or operate dangerous or heavy equipment when tired or sleepy. Review and reiteration of good sleep hygiene measures should be pursued with any patient. 4. The patient will be seen in follow-up in the sleep clinic at [facility] for discussion of the test results, symptom and treatment compliance review, further management strategies, etc. The referring provider will be notified of the test results.   I certify that I have  reviewed the entire raw data recording prior to the issuance of this report in accordance with the Standards of Accreditation of the American Academy of Sleep Medicine (AASM)   Star Age, MD, PhD Diplomat, American Board of Neurology and Sleep Medicine (Neurology and Sleep Medicine)

## 2019-04-24 NOTE — Progress Notes (Signed)
Patient referred by Dr. Leonie Man, seen by me on 01/31/19, diagnostic PSG on 03/15/19. Patient had a CPAP titration study on 04/15/19.  Please call and inform patient that I have entered an order for treatment with positive airway pressure (PAP) treatment for obstructive sleep apnea (OSA). She did well during the latest sleep study with CPAP. We will, therefore, arrange for a machine for home use through a DME (durable medical equipment) company of Her choice; and I will see the patient back in follow-up in about 10 weeks. Please also explain to the patient that I will be looking out for compliance data, which can be downloaded from the machine (stored on an SD card, that is inserted in the machine) or via remote access through a modem, that is built into the machine. At the time of the followup appointment we will discuss sleep study results and how it is going with PAP treatment at home. Please advise patient to bring Her machine at the time of the first FU visit, even though this is cumbersome. Bringing the machine for every visit after that will likely not be needed, but often helps for the first visit to troubleshoot if needed. Please re-enforce the importance of compliance with treatment and the need for [place] to monitor compliance data - often an insurance requirement and actually good feedback for the patient as far as how they are doing.  Also remind patient, that any interim PAP machine or mask issues should be first addressed with the DME company, as they can often help better with technical and mask fit issues. Please ask if patient has a preference regarding DME company.  Please also make sure, the patient has a follow-up appointment with me in about 10 weeks from the setup date, thanks. May see one of our nurse practitioners if needed for proper timing of the FU appointment.  Please fax or rout report to the referring provider. Thanks,   Star Age, MD, PhD [facility])

## 2019-04-25 ENCOUNTER — Encounter: Payer: Self-pay | Admitting: Oncology

## 2019-04-25 ENCOUNTER — Telehealth: Payer: Self-pay

## 2019-04-25 LAB — SURGICAL PATHOLOGY

## 2019-04-25 NOTE — Telephone Encounter (Signed)
-----   Message from Star Age, MD sent at 04/24/2019  9:10 AM EST ----- Patient referred by Dr. Leonie Man, seen by me on 01/31/19, diagnostic PSG on 03/15/19. Patient had a CPAP titration study on 04/15/19.  Please call and inform patient that I have entered an order for treatment with positive airway pressure (PAP) treatment for obstructive sleep apnea (OSA). She did well during the latest sleep study with CPAP. We will, therefore, arrange for a machine for home use through a DME (durable medical equipment) company of Her choice; and I will see the patient back in follow-up in about 10 weeks. Please also explain to the patient that I will be looking out for compliance data, which can be downloaded from the machine (stored on an SD card, that is inserted in the machine) or via remote access through a modem, that is built into the machine. At the time of the followup appointment we will discuss sleep study results and how it is going with PAP treatment at home. Please advise patient to bring Her machine at the time of the first FU visit, even though this is cumbersome. Bringing the machine for every visit after that will likely not be needed, but often helps for the first visit to troubleshoot if needed. Please re-enforce the importance of compliance with treatment and the need for [place] to monitor compliance data - often an insurance requirement and actually good feedback for the patient as far as how they are doing.  Also remind patient, that any interim PAP machine or mask issues should be first addressed with the DME company, as they can often help better with technical and mask fit issues. Please ask if patient has a preference regarding DME company.  Please also make sure, the patient has a follow-up appointment with me in about 10 weeks from the setup date, thanks. May see one of our nurse practitioners if needed for proper timing of the FU appointment.  Please fax or rout report to the referring provider. Thanks,    Star Age, MD, PhD [facility])

## 2019-04-25 NOTE — Telephone Encounter (Signed)
I contacted the pt and we were able to discuss cpap titration study.  Pt was agreeable with pursuing cpap treatment. She will use [employer] as her DME and fu on 07/23/2019 at 330. Pt understands once she is started on the machine to use the machine at least 4 hours every night.   [employer] order has been sent and letter will be mailed to the pt.

## 2019-04-26 ENCOUNTER — Telehealth: Payer: Self-pay | Admitting: *Deleted

## 2019-04-26 NOTE — Telephone Encounter (Signed)
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

## 2019-04-29 ENCOUNTER — Encounter: Payer: Self-pay | Admitting: *Deleted

## 2019-05-02 NOTE — Progress Notes (Signed)
[place]  [phone] [phone]     ID: Yamaira Spinner Nelson DOB: 11-11-1965  MR#: [id number]  IHK#:[id number]  Patient Care Team: [name], MD as PCP - General (Family Medicine) Mauro Kaufmann, RN as Oncology Nurse Navigator Rockwell Germany, RN as Oncology Nurse Navigator Donnie Mesa, MD as Consulting Physician (General Surgery) Zack Crager, Virgie Dad, MD as Consulting Physician (Oncology) Kyung Rudd, MD as Consulting Physician (Radiation Oncology) Delrae Rend, MD as Consulting Physician (Endocrinology) Star Age, MD as Consulting Physician (Neurology) Garvin Fila, MD as Consulting Physician (Neurology) Christophe Louis, MD as Consulting Physician (Obstetrics and Gynecology) Chauncey Cruel, MD OTHER MD:  CHIEF COMPLAINT: triple negative breast cancer  CURRENT TREATMENT: Adjuvant chemotherapy   INTERVAL HISTORY: Loretta Nelson returns today for follow up of her triple negative breast cancer accompanied by her daughter Loretta Nelson.   Loretta Nelson opted to proceed with left lumpectomy and sentinel lymph node biopsy on 04/17/2019 under Dr. Georgette Dover. Pathology from the procedure ([id number]) revealed: invasive ductal carcinoma, grade 3, 1.1 cm; high grade ductal carcinoma in situ; margins negative. Prognostic panel: estrogen receptor 0% negative, progesterone receptor 0% negative, Her2 negative (1+).  Both biopsied lymph nodes were negative for carcinoma (0/2).  Her genetic testing results returned on 04/19/2019. This revealed a likely pathogenic variant in [place].  The implications of this are discussed below.  Her case was represented at the multidisciplinary breast cancer conference 05/01/2019 at that time chemotherapy followed by radiation was recommended.   REVIEW OF SYSTEMS: Loretta Nelson did well with her surgery, with some pain and swelling particularly once she got back to work.  She is only taking Tylenol for this at this point.  She tells me she  is very active at work and usually gets between 3 and 5000 steps.  She had her first COVID-19 Pfizer vaccine dose on 04/27/2019 and will have the second 1 on 05/18/2019.  Aside from these issues a detailed review of systems today was stable.   HISTORY OF CURRENT ILLNESS: From the original intake note:  Loretta Nelson had routine screening mammography on 03/19/2019 showing a possible abnormality in the left breast. She underwent left diagnostic mammography with tomography and left breast ultrasonography at [facility] on 03/29/2019 showing: breast density category B; 8 mm mass in left breast at 12 o'clock; left axilla negative for lymphadenopathy.  Accordingly on 04/03/2019 she proceeded to biopsy of the left breast area in question. The pathology from this procedure ([id number]) showed: invasive mammary carcinoma, grade 3, e-cadherin positive. Prognostic indicators significant for: estrogen receptor, 10% positive with weak staining intensity and progesterone receptor, 0% negative. Proliferation marker Ki67 at 30%. HER2 negative by immunohistochemistry (1+).  The patient's subsequent history is as detailed below.   PAST MEDICAL HISTORY: Past Medical History:  Diagnosis Date  . Allergic rhinitis   . Anemia    low iron  . Anemia   . Anxiety   . Anxiety   . Arthritis    right knee, surgery done  . Cancer ([place]) 2021   left breast   . Complication of anesthesia    woke up during EGD  . Esophageal stricture   . Family history of breast cancer   . GERD (gastroesophageal reflux disease)   . Graves disease 01/13/11   radioactive iodine treatment, 18.8 millicuries  . Headache    tension headaches  . Hiatal hernia    small  . Hypertension   . Hyperthyroidism   . Hypothyroidism  s/p treatment  . Murmur    benign, h/o, caused by hyperdynamic contraction  . Obesity   . Pneumonia 2017   inhaler prescribed  . Sleep apnea 2021   no CPAP as yet, last study done lastnight 04/15/2019   . Tendonitis of shoulder, right   . Vitamin D deficiency   . Vitamin D deficiency     PAST SURGICAL HISTORY: Past Surgical History:  Procedure Laterality Date  . BALLOON DILATION N/A 09/07/2016   Procedure: BALLOON DILATION;  Surgeon: Arta Silence, MD;  Location: [facility] ENDOSCOPY;  Service: Endoscopy;  Laterality: N/A;  . BREAST LUMPECTOMY WITH RADIOACTIVE SEED AND SENTINEL LYMPH NODE BIOPSY Left 04/17/2019   Procedure: LEFT BREAST LUMPECTOMY WITH RADIOACTIVE SEED AND SENTINEL LYMPH NODE BIOPSY;  Surgeon: Donnie Mesa, MD;  Location: [place];  Service: General;  Laterality: Left;  LMA, PEC BLOCK  . CESAREAN SECTION  1999  . COLONOSCOPY    . ESOPHAGOGASTRODUODENOSCOPY    . ESOPHAGOGASTRODUODENOSCOPY (EGD) WITH PROPOFOL N/A 09/07/2016   Procedure: ESOPHAGOGASTRODUODENOSCOPY (EGD) WITH PROPOFOL;  Surgeon: Arta Silence, MD;  Location: [facility];  Service: Endoscopy;  Laterality: N/A;  . HYSTERECTOMY ABDOMINAL WITH SALPINGECTOMY Bilateral 02/17/2017   Procedure: HYSTERECTOMY ABDOMINAL WITH SALPINGECTOMY;  Surgeon: Christophe Louis, MD;  Location: [facility];  Service: Gynecology;  Laterality: Bilateral;  . KNEE ARTHROSCOPY     for torn meniscus  . WISDOM TOOTH EXTRACTION    . WISDOM TOOTH EXTRACTION      FAMILY HISTORY: Family History  Problem Relation Age of Onset  . Hypertension Mother   . CAD Maternal Grandfather   . Breast cancer Paternal Grandmother   . HIV Brother   . Heart attack Brother        sudden MI vs PE  . Breast cancer Sister 16  . Breast cancer Other        one dx 45s, others dx in 25s  . Colon polyps Neg Hx   . Colon cancer Neg Hx   . Liver disease Neg Hx    Patient's father was in his late 67s when he died from causes unknown to the patient.  Patient's mother is 61 years old (as of 04/2019). The patient denies a family hx of ovarian cancer. She reports breast cancer in her sister at age 53, in her paternal grandmother, and in 3 paternal cousins. She had 4 siblings, 2  brothers and 2 sisters, though only her sisters are still living.   GYNECOLOGIC HISTORY:  Patient's last menstrual period was 02/13/2017 (exact date). Menarche: 54 years old Age at first live birth: 54 years old [place] P 2 (twins) LMP 01/2017 Contraceptive: used from 2025-4270 without complication HRT never used  Hysterectomy? Yes, 01/2017, benign pathology BSO? 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 She is divorced. She lives at home with her twin daughters, Loretta Nelson and Loretta Nelson, who are 54 years old fraternal twins and attending college remotely. Loretta Nelson is studying music and communications at [employer].Loretta Nelson is studying IT.    ADVANCED DIRECTIVES: Not in place; the patient intends to name her sister Loretta Nelson as her HCPOA [phone])   HEALTH MAINTENANCE: Social History   Tobacco Use  . Smoking status: Never Smoker  . Smokeless tobacco: Never Used  Substance Use Topics  . Alcohol use: No    Alcohol/week: 0.0 standard drinks  . Drug use: No     Colonoscopy: at  age 33  PAP: 02/2018  Bone density: never done   No Known Allergies  Current Outpatient Medications  Medication Sig Dispense Refill  . amLODipine (NORVASC) 5 MG tablet Take 5 mg by mouth daily.   5  . Cholecalciferol 4000 UNITS CAPS Take 4,000 Units by mouth daily.     . citalopram (CELEXA) 20 MG tablet Take 20 mg by mouth daily.   6  . EDARBYCLOR 40-25 MG TABS Take 1 tablet by mouth daily.  5  . HYDROcodone-acetaminophen (NORCO/VICODIN) 5-325 MG tablet Take 1 tablet by mouth every 6 (six) hours as needed for moderate pain. 15 tablet 0  . ibuprofen (ADVIL) 200 MG tablet Take 400 mg by mouth every 6 (six) hours as needed for moderate pain.    [place] Kitchen KLOR-CON M20 20 MEQ tablet Take 20 mEq by mouth daily.   1  . L-Methylfolate-B12-B6-B2 (CEREFOLIN) 07-29-48-5 MG TABS Take 1 tablet by mouth every  morning. 90 tablet 3  . levothyroxine (SYNTHROID) 175 MCG tablet Take 175 mcg by mouth daily before breakfast.   5  . loratadine (CLARITIN) 10 MG tablet Take 10 mg by mouth daily as needed for allergies.     . metoprolol (TOPROL-XL) 200 MG 24 hr tablet Take 200 mg by mouth daily.   5  . omeprazole (PRILOSEC) 40 MG capsule Take 40 mg by mouth 2 (two) times daily as needed (acid reflux).     Marland Kitchen spironolactone (ALDACTONE) 50 MG tablet Take 50 mg by mouth daily.     No current facility-administered medications for this visit.    OBJECTIVE: Morbidly obese African-American woman who appears younger than stated age  46:   05/03/19 0934  BP: (!) 145/76  Pulse: 66  Resp: 17  Temp: 98 F (36.7 C)  SpO2: 99%     Body mass index is 52.8 kg/m.   Wt Readings from Last 3 Encounters:  05/03/19 (!) 307 lb 9.6 oz (139.5 kg)  04/17/19 (!) 303 lb (137.4 kg)  04/16/19 (!) 303 lb 3.2 oz (137.5 kg)      ECOG FS:1 - Symptomatic but completely ambulatory  Sclerae unicteric, EOMs intact Wearing a mask No cervical or supraclavicular adenopathy Lungs no rales or rhonchi Heart regular rate and rhythm Abd soft, nontender, positive bowel sounds MSK no focal spinal tenderness, no upper extremity lymphedema Neuro: nonfocal, well oriented, appropriate affect Breasts: The right breast is benign.  The left breast is status post lumpectomy.  The cosmetic result is excellent.  There is no significant alteration to the breast contour.  The incisions are healing nicely with no erythema swelling or dehiscence.  Both axillae are benign.   LAB RESULTS:  CMP     Component Value Date/Time   NA 141 04/16/2019 1105   K 3.0 (L) 04/16/2019 1105   CL 101 04/16/2019 1105   CO2 32 04/10/2019 1240   GLUCOSE 108 (H) 04/16/2019 1105   BUN 14 04/16/2019 1105   CREATININE 0.80 04/16/2019 1105   CREATININE 0.83 04/10/2019 1240   CALCIUM 9.0 04/10/2019 1240   PROT 7.4 04/10/2019 1240   ALBUMIN 3.8 04/10/2019 1240    AST 17 04/10/2019 1240   ALT 19 04/10/2019 1240   ALKPHOS 64 04/10/2019 1240   BILITOT 0.4 04/10/2019 1240   GFRNONAA >60 04/10/2019 1240   GFRAA >60 04/10/2019 1240    No results found for: TOTALPROTELP, ALBUMINELP, A1GS, A2GS, BETS, BETA2SER, GAMS, MSPIKE, SPEI  Lab Results  Component Value Date   WBC 5.8 04/10/2019  NEUTROABS 3.1 04/10/2019   HGB 12.6 04/16/2019   HCT 37.0 04/16/2019   MCV 84.8 04/10/2019   PLT 217 04/10/2019    No results found for: LABCA2  No components found for: BDZHGD924  No results for input(s): INR in the last 168 hours.  No results found for: LABCA2  No results found for: QAS341  No results found for: DQQ229  No results found for: NLG921  No results found for: CA2729  No components found for: HGQUANT  No results found for: CEA1 / No results found for: CEA1   No results found for: AFPTUMOR  No results found for: CHROMOGRNA  No results found for: KPAFRELGTCHN, LAMBDASER, KAPLAMBRATIO (kappa/lambda light chains)  No results found for: HGBA, HGBA2QUANT, HGBFQUANT, HGBSQUAN (Hemoglobinopathy evaluation)   No results found for: LDH  No results found for: IRON, TIBC, IRONPCTSAT (Iron and TIBC)  No results found for: FERRITIN  Urinalysis No results found for: COLORURINE, APPEARANCEUR, LABSPEC, PHURINE, GLUCOSEU, HGBUR, BILIRUBINUR, KETONESUR, PROTEINUR, UROBILINOGEN, NITRITE, LEUKOCYTESUR   STUDIES: NM Sentinel Node Inj-No Rpt (Breast)  Result Date: 04/17/2019 Sulfur colloid was injected by the nuclear medicine technologist for melanoma sentinel node.   MM Breast Surgical Specimen  Result Date: 04/17/2019 CLINICAL DATA:  Status post radioactive seed localized lumpectomy LEFT breast. EXAM: SPECIMEN RADIOGRAPH OF THE LEFT BREAST COMPARISON:  Previous exam(s). FINDINGS: Status post excision of the left breast. The radioactive seed and ribbon shaped biopsy marker clip are present, completely intact, and were marked for pathology.  IMPRESSION: Specimen radiograph of the left breast. Electronically Signed   By: Nolon Nations M.D.   On: 04/17/2019 15:47   MM LT RADIOACTIVE SEED LOC MAMMO GUIDE  Result Date: 04/16/2019 CLINICAL DATA:  Localization prior to surgery EXAM: MAMMOGRAPHIC GUIDED RADIOACTIVE SEED LOCALIZATION OF THE LEFT BREAST COMPARISON:  Previous exam(s). FINDINGS: Patient presents for radioactive seed localization prior to surgery. I met with the patient and we discussed the procedure of seed localization including benefits and alternatives. We discussed the high likelihood of a successful procedure. We discussed the risks of the procedure including infection, bleeding, tissue injury and further surgery. We discussed the low dose of radioactivity involved in the procedure. Informed, written consent was given. The usual time-out protocol was performed immediately prior to the procedure. Using mammographic guidance, sterile technique, 1% lidocaine and an I-125 radioactive seed, biopsy clip was localized using a superior approach. The follow-up mammogram images confirm the seed in the expected location and were marked for surgeon. Follow-up survey of the patient confirms presence of the radioactive seed. Order number of I-125 seed:  [id number]. Total activity:  4.481 millicuries reference Date: January 29, 2019 The patient tolerated the procedure well and was released from the [facility]. She was given instructions regarding seed removal. IMPRESSION: Radioactive seed localization left breast. No apparent complications. Electronically Signed   By: Dorise Bullion III M.D   On: 04/16/2019 15:17   Cpap titration  Result Date: 04/15/2019 Star Age, MD     04/24/2019  9:07 AM PATIENT'S NAME:  Loretta Nelson DOB:      01/28/1966     MR#:    [id number]    DATE OF RECORDING: 04/15/2019 REFERRING M.D.:  [name], MD Study Performed:   CPAP  Titration HISTORY:  54 year old woman with a history of hypertension, vitamin D  deficiency, Graves' disease with status post radioactive iodine, reflux disease, arthritis, anxiety, anemia, allergic rhinitis, hypothyroidism, and morbid obesity with a BMI of over 50, who presents for a  full night titration study. Her baseline sleep study from 03/15/19 showed severe obstructive sleep apnea, with a total AHI of 57.4/hour, REM AHI of 98.6/hour, supine AHI of 53/hour and O2 nadir of 73%. The patient endorsed the Epworth Sleepiness Scale at 15/24 points. The patient's weight 310 pounds with a height of 64 (inches), resulting in a BMI of 53.1 kg/m2. The patient's neck circumference measured 19 inches. CURRENT MEDICATIONS: Norvasc, Vit D, Celexa, Edarbyclor, Flonase, Advil, Klor-Con, B-12, Synthroid, Claritin,Toprol-XL, Prilosec PROCEDURE:  This is a multichannel digital polysomnogram utilizing the SomnoStar 11.2 system.  Electrodes and sensors were applied and monitored per AASM Specifications.   EEG, EOG, Chin and Limb EMG, were sampled at 200 Hz.  ECG, Snore and Nasal Pressure, Thermal Airflow, Respiratory Effort, CPAP Flow and Pressure, Oximetry was sampled at 50 Hz. Digital video and audio were recorded.    The patient was fitted with a nasal mask, but indicated, she would prefer using nasal pillows at home. CPAP was initiated at 6 cmH20 with heated humidity per AASM standards and pressure was advanced to 14 cmH20 because of hypopneas, apneas and desaturations.  At a PAP pressure of 14 cmH20, there was a reduction of the AHI to 0.9/hour with supine REM sleep achieved and O2 nadir briefly at 90%. Lights Out was at 22:38 and Lights On at 04:59. Total recording time (TRT) was 381 minutes, with a total sleep time (TST) of 295.5 minutes. The patient's sleep latency was 25.5 minutes. REM latency was 148.5 minutes, which is delayed. The sleep efficiency was 77.6%.  SLEEP ARCHITECTURE: WASO (Wake after sleep onset) was 52.5 minutes with mild sleep fragmentation noted. There were 24.5 minutes in Stage N1, 79  minutes Stage N2, 137 minutes Stage N3 and 55 minutes in Stage REM.  The percentage of Stage N1 was 8.3%, Stage N2 was 26.7%, Stage N3 was 46.4%, which is markedly increased and Stage R (REM sleep) was 18.6%, which is near normal. The arousals were noted as: 60 were spontaneous, 10 were associated with PLMs, 16 were associated with respiratory events. RESPIRATORY ANALYSIS:  There was a total of 43 respiratory events: 0 obstructive apneas, 0 central apneas and 0 mixed apneas with a total of 0 apneas and an apnea index (AI) of 0 /hour. There were 43 hypopneas with a hypopnea index of 8.7/hour. The patient also had 0 respiratory event related arousals (RERAs).    The total APNEA/HYPOPNEA INDEX  (AHI) was 8.7 /hour and the total RESPIRATORY DISTURBANCE INDEX was 8.7 /hour  8 events occurred in REM sleep and 35 events in NREM. The REM AHI was 8.7 /hour versus a non-REM AHI of 8.7 /hour.  The patient spent 295.5 minutes of total sleep time in the supine position and 0 minutes in non-supine. The supine AHI was 8.7, versus a non-supine AHI of 0.0. OXYGEN SATURATION & C02:  The baseline 02 saturation was 96%, with the lowest being 81%. Time spent below 89% saturation equaled 22 minutes. PERIODIC LIMB MOVEMENTS:  The patient had a total of 58 Periodic Limb Movements. The Periodic Limb Movement (PLM) index was 11.8 and the PLM Arousal index was 2. /hour. Audio and video analysis did not show any abnormal or unusual movements, behaviors, phonations or vocalizations. The patient took no bathroom breaks. The EKG was in keeping with normal sinus rhythm (NSR). Post-study, the patient indicated that sleep was better than usual. IMPRESSION: 1. Severe Obstructive Sleep Apnea (OSA) 2. Dysfunctions associated with sleep stages or arousal from sleep  RECOMMENDATIONS:  1. This study demonstrates resolution of the patient's obstructive sleep apnea with CPAP therapy. I will, therefore, start the patient on home CPAP treatment at a pressure  of 14 cm via nasal pillows with heated humidity. The patient should be reminded to be fully compliant with PAP therapy to improve sleep related symptoms and decrease long term cardiovascular risks. The patient should be reminded, that it may take up to 3 months to get fully used to using PAP with all planned sleep. The earlier full compliance is achieved, the better long term compliance tends to be. Please note that untreated obstructive sleep apnea may carry additional perioperative morbidity. Patients with significant obstructive sleep apnea should receive perioperative PAP therapy and the surgeons and particularly the anesthesiologist should be informed of the diagnosis and the severity of the sleep disordered breathing. 2. This study shows sleep fragmentation and abnormal sleep stage percentages; these are nonspecific findings and per se do not signify an intrinsic sleep disorder or a cause for the patient's sleep-related symptoms. Causes include (but are not limited to) the first night effect of the sleep study, circadian rhythm disturbances, medication effect or an underlying mood disorder or medical problem. 3. The patient should be cautioned not to drive, work at heights, or operate dangerous or heavy equipment when tired or sleepy. Review and reiteration of good sleep hygiene measures should be pursued with any patient. 4. The patient will be seen in follow-up in the sleep clinic at [facility] for discussion of the test results, symptom and treatment compliance review, further management strategies, etc. The referring provider will be notified of the test results.  I certify that I have reviewed the entire raw data recording prior to the issuance of this report in accordance with the Standards of Accreditation of the American Academy of Sleep Medicine (AASM)  Star Age, MD, PhD Diplomat, American Board of Neurology and Sleep Medicine (Neurology and Sleep Medicine)    ELIGIBLE FOR AVAILABLE RESEARCH  PROTOCOL:BR003?--Depending on final surgical pathology  ASSESSMENT: 54 y.o. [place] woman status post left breast upper outer quadrant biopsy 04/03/2019 for a clinical T1b N0, stage IB invasive ductal carcinoma, grade 3, functionally triple negative (estrogen receptor is weakly positive at 10%, progesterone receptor negative, HER-2 not amplified), with an MIB-1 of 30%  (1) status post left lumpectomy and sentinel lymph node sampling 04/17/2019 for a pT1c pN0, stage IB invasive ductal carcinoma, grade 3, triple negative, with negative margins  (a) a total of 2 sentinel lymph nodes were removed  (2) adjuvant chemotherapy consisting of cyclophosphamide and docetaxel every 21 days x 4 to start 05/23/2019  (3) adjuvant radiation to follow  (4) genetics testing 04/19/2019 through the [facility] Breast Cancer STAT Panel + Common Hereditary Cancers Panel 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 STK11, TP53, TSC1, TSC2, and VHL.  The following genes were evaluated for sequence changes only: SDHA and HOXB13 c.251G>A variant only.  (b) current data suggests an increase risk of breast and ovarian cancer in patients carrying a BARD1 mutation,  but data is preliminary and insufficient for definitive recommendations   PLAN: Loretta Nelson did well with her surgery and is now ready to initiate systemic treatment.  We again reviewed the fact that with  triple negative breast cancers antiestrogens and anti-HER-2 immunotherapy are not options and the only way to treat systemically is with chemotherapy.  We discussed the difference between standard of care [facility] followed by T versus TC and I would be comfortable offering her the latter, avoiding anthracycline cardiac issues and finishing chemotherapy in 12 rather than 20 weeks.  She is agreeable to this.  We then discussed the possible toxicities side effects and complications of docetaxel and cyclophosphamide.  She understands that there is a very low risk of permanent alopecia, with a slightly higher risk (in the 6% range) of having some thin scalp spots.  We discussed the cold cap in detail and she is not interested in pursuing that option.  She is aware of issues regarding nausea, fatigue, weight gain, and damage to nails.  We also discussed neuropathy concerns in detail.  She understands that in some cases neuropathy is permanent and therefore we would like to watch that very closely  She will have a port placed by her surgeon Dr. Georgette Dover.  She will come for "chemotherapy teaching".  She will start on 05/23/2019 and see [place] shortly before that to make sure everything is in place.  She will receive her PEG fill gastrium dose on day 3.  At this point she is hopeful to be able to continue to work through the treatments.  Total encounter time 45 minutes.Chauncey Cruel, MD   05/03/2019 10:28 AM Medical Oncology and Hematology [facility] [address], [place] [address] Tel. [phone]    Fax. [phone]   This document serves as a record of services personally performed by Lurline Del, MD. It was created on his behalf by Wilburn Mylar, a trained medical scribe. The creation of this record is based on the scribe's personal observations and the provider's statements to them.   I, Lurline Del MD, have reviewed the above documentation for accuracy and  completeness, and I agree with the above.   *Total Encounter Time as defined by the Centers for Medicare and Medicaid Services includes, in addition to the face-to-face time of a patient visit (documented in the note above) non-face-to-face time: obtaining and reviewing outside history, ordering and reviewing medications, tests or procedures, care coordination (communications with other health care professionals or caregivers) and documentation in the medical record.

## 2019-05-03 ENCOUNTER — Other Ambulatory Visit: Payer: Self-pay

## 2019-05-03 ENCOUNTER — Inpatient Hospital Stay: Payer: BC Managed Care – PPO | Attending: Oncology | Admitting: Oncology

## 2019-05-03 ENCOUNTER — Encounter: Payer: Self-pay | Admitting: *Deleted

## 2019-05-03 ENCOUNTER — Ambulatory Visit: Payer: Self-pay | Admitting: Surgery

## 2019-05-03 VITALS — BP 145/76 | HR 66 | Temp 98.0°F | Resp 17 | Ht 64.0 in | Wt 307.6 lb

## 2019-05-03 DIAGNOSIS — Z9079 Acquired absence of other genital organ(s): Secondary | ICD-10-CM | POA: Diagnosis not present

## 2019-05-03 DIAGNOSIS — Z803 Family history of malignant neoplasm of breast: Secondary | ICD-10-CM | POA: Diagnosis not present

## 2019-05-03 DIAGNOSIS — Z90710 Acquired absence of both cervix and uterus: Secondary | ICD-10-CM | POA: Insufficient documentation

## 2019-05-03 DIAGNOSIS — Z79899 Other long term (current) drug therapy: Secondary | ICD-10-CM | POA: Insufficient documentation

## 2019-05-03 DIAGNOSIS — Z8249 Family history of ischemic heart disease and other diseases of the circulatory system: Secondary | ICD-10-CM | POA: Diagnosis not present

## 2019-05-03 DIAGNOSIS — F419 Anxiety disorder, unspecified: Secondary | ICD-10-CM | POA: Insufficient documentation

## 2019-05-03 DIAGNOSIS — Z5189 Encounter for other specified aftercare: Secondary | ICD-10-CM | POA: Diagnosis not present

## 2019-05-03 DIAGNOSIS — E039 Hypothyroidism, unspecified: Secondary | ICD-10-CM | POA: Insufficient documentation

## 2019-05-03 DIAGNOSIS — C50412 Malignant neoplasm of upper-outer quadrant of left female breast: Secondary | ICD-10-CM | POA: Insufficient documentation

## 2019-05-03 DIAGNOSIS — Z170 Estrogen receptor positive status [ER+]: Secondary | ICD-10-CM

## 2019-05-03 DIAGNOSIS — Z7952 Long term (current) use of systemic steroids: Secondary | ICD-10-CM | POA: Diagnosis not present

## 2019-05-03 DIAGNOSIS — Z5111 Encounter for antineoplastic chemotherapy: Secondary | ICD-10-CM | POA: Insufficient documentation

## 2019-05-03 DIAGNOSIS — K5900 Constipation, unspecified: Secondary | ICD-10-CM | POA: Insufficient documentation

## 2019-05-03 DIAGNOSIS — Z171 Estrogen receptor negative status [ER-]: Secondary | ICD-10-CM | POA: Diagnosis not present

## 2019-05-03 DIAGNOSIS — I10 Essential (primary) hypertension: Secondary | ICD-10-CM | POA: Diagnosis not present

## 2019-05-03 DIAGNOSIS — G4733 Obstructive sleep apnea (adult) (pediatric): Secondary | ICD-10-CM | POA: Insufficient documentation

## 2019-05-03 DIAGNOSIS — F39 Unspecified mood [affective] disorder: Secondary | ICD-10-CM | POA: Diagnosis not present

## 2019-05-03 DIAGNOSIS — C50812 Malignant neoplasm of overlapping sites of left female breast: Secondary | ICD-10-CM | POA: Diagnosis present

## 2019-05-03 DIAGNOSIS — Z791 Long term (current) use of non-steroidal anti-inflammatories (NSAID): Secondary | ICD-10-CM | POA: Diagnosis not present

## 2019-05-03 DIAGNOSIS — Z1501 Genetic susceptibility to malignant neoplasm of breast: Secondary | ICD-10-CM | POA: Diagnosis not present

## 2019-05-03 DIAGNOSIS — Z8349 Family history of other endocrine, nutritional and metabolic diseases: Secondary | ICD-10-CM | POA: Insufficient documentation

## 2019-05-03 MED ORDER — PROCHLORPERAZINE MALEATE 10 MG PO TABS: 10.0000 mg | ORAL_TABLET | Freq: Four times a day (QID) | ORAL | 1 refills | Status: DC | PRN

## 2019-05-03 MED ORDER — DEXAMETHASONE 4 MG PO TABS: 8.0000 mg | ORAL_TABLET | Freq: Two times a day (BID) | ORAL | 1 refills | Status: DC

## 2019-05-03 MED ORDER — LORAZEPAM 0.5 MG PO TABS: 0.5000 mg | ORAL_TABLET | Freq: Four times a day (QID) | ORAL | 0 refills | Status: DC | PRN

## 2019-05-03 MED ORDER — LORATADINE 10 MG PO TABS: 10.0000 mg | ORAL_TABLET | Freq: Every day | ORAL | 0 refills | Status: DC

## 2019-05-03 MED ORDER — LIDOCAINE-PRILOCAINE 2.5-2.5 % EX CREA: TOPICAL_CREAM | CUTANEOUS | 3 refills | Status: DC

## 2019-05-03 NOTE — H&P (Signed)
History of Present Illness (Jamale Spangler K. Camryn Quesinberry MD; 05/03/2019 1:12 PM) The patient is a 53 year old female presenting for a post-operative visit. [facility]  PCP - [facility] - [facility] - Tara Cole  This is a 53 year old female who underwent recent screen mammogram that showed an abnormality in her left breast. Subsequently, she was found to have an 8 mm mass in the left breast at 12:00 10 cmfn. 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 Axilla was negative.  The patient's sister had breast cancer and required chemotherapy. Unfortunately, her best friend passed away this morning in [place] from metastatic breast cancer.   On 04/17/19, she underwent left radioactive seed localized lumpectomy and sentinel lymph node biopsy. Margins were negative. Lymph nodes were negative. She has met with oncology and chemotherapy will initiate on 05/23/19. That will be followed by adjuvant radiation therapy. She comes in today for discussion of port placement as well as an initial postop visit. She has a little bit of soreness in her medial breast. Her axilla is pain free.   Problem List/Past Medical (Macallister Ashmead K. 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 Shaka Cardin, MD; 05/03/2019 1:12 PM) No Known Allergies [04/08/2019]:  Medication History (Chanel Nolan, CMA; 05/03/2019 11:06 AM) amLODIPine Besylate (5MG Tablet, Oral) Active. Cholecalciferol (100 MCG(4000 UT) Capsule, Oral) Active. CeleXA (20MG Tablet, Oral) Active. Edarbyclor (40-25MG Tablet, Oral) Active. Flonase (50MCG/DOSE Inhaler, Nasal) Active. Ibuprofen (800MG Tablet, Oral) Active. Klor-Con M20 (20MEQ Tablet ER, Oral) Active. Cerefolin (07-29-48-5MG Tablet, Oral) Active. Synthroid (175MCG Tablet, Oral) Active. Claritin (10MG Tablet, Oral) Active. Toprol XL (200MG Tablet ER 24HR, Oral) Active. PriLOSEC (40MG  Capsule DR, Oral) Active. Medications Reconciled    Vitals (Chanel Nolan CMA; 05/03/2019 11:06 AM) 05/03/2019 11:06 AM Weight: 308 lb Height: 64in Body Surface Area: 2.35 m Body Mass Index: 52.87 kg/m  Temp.: 98.1F  Pulse: 79 (Regular)  BP: 132/76 (Sitting, Left Arm, Standard)        Physical Exam (Kevia Zaucha K. Citlalic Norlander MD; 05/03/2019 1:13 PM)  The physical exam findings are as follows: Note:Constitutional: WDWN in NAD, conversant, no obvious deformities; lying in bed comfortably Eyes: Pupils equal, round; sclera anicteric; moist conjunctiva; no lid lag HENT: Oral mucosa moist; good dentition Neck: No masses palpated, trachea midline; no thyromegaly Lungs: CTA bilaterally; normal respiratory effort Breasts: right breast - normal nipple, no palpable masses or lymph nodes Left breast - healed circumareolar incision with slight seroma above this area; no sign of infection. Axillary incision well-healed with no sign of infection CV: Regular rate and rhythm; no murmurs; extremities well-perfused with no edema Abd: +bowel sounds, soft, non-tender, no palpable organomegaly; no palpable hernias Musc: Unable to assess gait; no apparent clubbing or cyanosis in extremities Lymphatic: No palpable cervical or axillary lymphadenopathy Skin: Warm, dry; no sign of jaundice Psychiatric - alert and oriented x 4; calm mood and affect    Assessment & Plan (Valyn Latchford K. 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 The surgical procedure has been discussed with the patient. Potential risks, benefits, alternative treatments, and expected outcomes have been explained. All of the patient's questions at this time have been answered. The likelihood of reaching the patient's treatment goal is good. The patient understand the proposed surgical procedure and wishes to  proceed.    Laia Wiley K. Keiston Manley, MD, FACS [facility]  General/ Trauma Surgery   05/03/2019 1:14 PM   

## 2019-05-03 NOTE — H&P (View-Only) (Signed)
History of Present Illness Loretta Nelson. Loretta Odom MD; 05/03/2019 1:12 PM) The patient is a 54 year old female presenting for a post-operative visit. [place]  PCP - [place] - Loretta Nelson  This is a 54 year old female who underwent recent screen mammogram that showed an abnormality in her left breast. Subsequently, she was found to have an 8 mm mass in the left breast at 12:00 10 cmfn. 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 Axilla was negative.  The patient's sister had breast cancer and required chemotherapy. Unfortunately, her best friend passed away this morning in [place] from metastatic breast cancer.   On 04/17/19, she underwent left radioactive seed localized lumpectomy and sentinel lymph node biopsy. Margins were negative. Lymph nodes were negative. She has met with oncology and chemotherapy will initiate on 05/23/19. That will be followed by adjuvant radiation therapy. She comes in today for discussion of port placement as well as an initial postop visit. She has a little bit of soreness in her medial breast. Her axilla is pain free.   Problem List/Past Medical Loretta Key K. 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 Stephaun Million, MD; 05/03/2019 1:12 PM) No Known Allergies [04/08/2019]:  Medication History (Loretta Nelson, CMA; 05/03/2019 11:06 AM) amLODIPine Besylate (5MG Tablet, Oral) Active. Cholecalciferol (100 MCG(4000 UT) Capsule, Oral) Active. CeleXA (20MG Tablet, Oral) Active. Edarbyclor (40-25MG Tablet, Oral) Active. Flonase (50MCG/DOSE Inhaler, Nasal) Active. Ibuprofen (800MG Tablet, Oral) Active. Klor-Con M20 [facility] Tablet ER, Oral) Active. Cerefolin (07-29-48-5MG Tablet, Oral) Active. Synthroid (175MCG Tablet, Oral) Active. Claritin (10MG Tablet, Oral) Active. Toprol XL (200MG Tablet ER 24HR, Oral) Active. PriLOSEC (40MG  Capsule DR, Oral) Active. Medications Reconciled    Vitals (Loretta Nelson CMA; 05/03/2019 11:06 AM) 05/03/2019 11:06 AM Weight: 308 lb Height: 64in Body Surface Area: 2.35 m Body Mass Index: 52.87 kg/m  Temp.: 98.36F  Pulse: 79 (Regular)  BP: 132/76 (Sitting, Left Arm, Standard)        Physical Exam Loretta Key K. Shannara Winbush MD; 05/03/2019 1:13 PM)  The physical exam findings are as follows: Note:Constitutional: WDWN in NAD, conversant, no obvious deformities; lying in bed comfortably Eyes: Pupils equal, round; sclera anicteric; moist conjunctiva; no lid lag HENT: Oral mucosa moist; good dentition Neck: No masses palpated, trachea midline; no thyromegaly Lungs: CTA bilaterally; normal respiratory effort Breasts: right breast - normal nipple, no palpable masses or lymph nodes Left breast - healed circumareolar incision with slight seroma above this area; no sign of infection. Axillary incision well-healed with no sign of infection CV: Regular rate and rhythm; no murmurs; extremities well-perfused with no edema Abd: +bowel sounds, soft, non-tender, no palpable organomegaly; no palpable hernias Musc: Unable to assess gait; no apparent clubbing or cyanosis in extremities Lymphatic: No palpable cervical or axillary lymphadenopathy Skin: Warm, dry; no sign of jaundice Psychiatric - alert and oriented x 4; calm mood and affect    Assessment & Plan Loretta Key K. 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 The surgical procedure has been discussed with the patient. Potential risks, benefits, alternative treatments, and expected outcomes have been explained. All of the patient's questions at this time have been answered. The likelihood of reaching the patient's treatment goal is good. The patient understand the proposed surgical procedure and wishes to  proceed.  Loretta Nelson. Georgette Dover, MD, [facility]  General/ Trauma Surgery   05/03/2019 1:14 PM

## 2019-05-06 ENCOUNTER — Telehealth: Payer: Self-pay | Admitting: Oncology

## 2019-05-06 NOTE — Telephone Encounter (Signed)
I talk with patient regarding schedule  

## 2019-05-07 ENCOUNTER — Encounter: Payer: Self-pay | Admitting: *Deleted

## 2019-05-07 ENCOUNTER — Encounter: Payer: Self-pay | Admitting: Oncology

## 2019-05-07 ENCOUNTER — Other Ambulatory Visit: Payer: Self-pay | Admitting: Oncology

## 2019-05-07 ENCOUNTER — Telehealth: Payer: Self-pay | Admitting: Oncology

## 2019-05-07 NOTE — Telephone Encounter (Signed)
I talk with patient regarding reschedule from 3/24 to 3/22

## 2019-05-08 ENCOUNTER — Ambulatory Visit: Payer: Self-pay | Admitting: Surgery

## 2019-05-08 ENCOUNTER — Encounter: Payer: Self-pay | Admitting: *Deleted

## 2019-05-09 NOTE — Progress Notes (Signed)
DUE TO COVID-19 ONLY ONE VISITOR IS ALLOWED TO COME WITH YOU AND STAY IN THE WAITING ROOM ONLY DURING PRE OP AND PROCEDURE DAY OF SURGERY. THE 1 VISITOR MAY VISIT WITH YOU AFTER SURGERY IN YOUR PRIVATE ROOM DURING VISITING HOURS ONLY!  YOU NEED TO HAVE A COVID 19 TEST ON____3/20/2021 ___ @_______ , THIS TEST MUST BE DONE BEFORE SURGERY, COME  [place], [place] , [address].  ([place]) ONCE YOUR COVID TEST IS COMPLETED, PLEASE BEGIN THE QUARANTINE INSTRUCTIONS AS OUTLINED IN YOUR HANDOUT.                Trang Hell Kreher  05/09/2019   Your procedure is scheduled on:  05/22/2019   Report to [facility] Main  Entrance   Report to admitting at0630 AM     Call this number if you have problems the morning of surgery [phone]    Remember: Do not eat food or drink liquids :After Midnight. BRUSH YOUR TEETH MORNING OF SURGERY AND RINSE YOUR MOUTH OUT, NO CHEWING GUM CANDY OR MINTS.     Take these medicines the morning of surgery with A SIP OF WATER:                Amlodipine, Toprol, Celexa, Synthroid                  You may not have any metal on your body including hair pins and              piercings  Do not wear jewelry, make-up, lotions, powders or perfumes, deodorant             Do not wear nail polish on your fingernails.  Do not shave  48 hours prior to surgery.           Do not bring valuables to the hospital. [place].  Contacts, dentures or bridgework may not be worn into surgery.  Leave suitcase in the car. After surgery it may be brought to your room.     Patients discharged the day of surgery will not be allowed to drive home. IF YOU ARE HAVING SURGERY AND GOING HOME THE SAME DAY, YOU MUST HAVE AN ADULT TO DRIVE YOU HOME AND BE WITH YOU FOR 24 HOURS. YOU MAY GO HOME BY TAXI OR UBER OR ORTHERWISE, BUT AN ADULT MUST ACCOMPANY YOU HOME AND STAY WITH YOU FOR 24 HOURS.  Name and phone number  of your driver:                Please read over the following fact sheets you were given: _____________________________________________________________________             [facility] - Preparing for Surgery Before surgery, you can play an important role.  Because skin is not sterile, your skin needs to be as free of germs as possible.  You can reduce the number of germs on your skin by washing with CHG (chlorahexidine gluconate) soap before surgery.  CHG is an antiseptic cleaner which kills germs and bonds with the skin to continue killing germs even after washing. Please DO NOT use if you have an allergy to CHG or antibacterial soaps.  If your skin becomes reddened/irritated stop using the CHG and inform your nurse when you arrive at Short Stay. Do not shave (including legs and underarms) for at least  48 hours prior to the first CHG shower.  You may shave your face/neck. Please follow these instructions carefully:  1.  Shower with CHG Soap the night before surgery and the  morning of Surgery.  2.  If you choose to wash your hair, wash your hair first as usual with your  normal  shampoo.  3.  After you shampoo, rinse your hair and body thoroughly to remove the  shampoo.                           4.  Use CHG as you would any other liquid soap.  You can apply chg directly  to the skin and wash                       Gently with a scrungie or clean washcloth.  5.  Apply the CHG Soap to your body ONLY FROM THE NECK DOWN.   Do not use on face/ open                           Wound or open sores. Avoid contact with eyes, ears mouth and genitals (private parts).                       Wash face,  Genitals (private parts) with your normal soap.             6.  Wash thoroughly, paying special attention to the area where your surgery  will be performed.  7.  Thoroughly rinse your body with warm water from the neck down.  8.  DO NOT shower/wash with your normal soap after using and rinsing off  the CHG  Soap.                9.  Pat yourself dry with a clean towel.            10.  Wear clean pajamas.            11.  Place clean sheets on your bed the night of your first shower and do not  sleep with pets. Day of Surgery : Do not apply any lotions/deodorants the morning of surgery.  Please wear clean clothes to the hospital/surgery center.  FAILURE TO FOLLOW THESE INSTRUCTIONS MAY RESULT IN THE CANCELLATION OF YOUR SURGERY PATIENT SIGNATURE_________________________________  NURSE SIGNATURE__________________________________  ________________________________________________________________________

## 2019-05-10 ENCOUNTER — Other Ambulatory Visit: Payer: BC Managed Care – PPO

## 2019-05-10 ENCOUNTER — Encounter (HOSPITAL_COMMUNITY)
Admission: RE | Admit: 2019-05-10 | Discharge: 2019-05-10 | Disposition: A | Discharge status: Home / Self Care | Payer: BC Managed Care – PPO | Source: Ambulatory Visit | Attending: Surgery | Admitting: Surgery

## 2019-05-10 ENCOUNTER — Other Ambulatory Visit: Payer: Self-pay

## 2019-05-10 ENCOUNTER — Inpatient Hospital Stay: Payer: BC Managed Care – PPO

## 2019-05-10 ENCOUNTER — Encounter (HOSPITAL_COMMUNITY): Payer: Self-pay

## 2019-05-10 ENCOUNTER — Encounter: Payer: Self-pay | Admitting: Oncology

## 2019-05-10 DIAGNOSIS — Z01812 Encounter for preprocedural laboratory examination: Secondary | ICD-10-CM | POA: Insufficient documentation

## 2019-05-10 DIAGNOSIS — C50919 Malignant neoplasm of unspecified site of unspecified female breast: Secondary | ICD-10-CM | POA: Insufficient documentation

## 2019-05-10 LAB — CBC
HCT: 36.9 % (ref 36.0–46.0)
Hemoglobin: 11.4 g/dL — ABNORMAL LOW (ref 12.0–15.0)
MCH: 26.5 pg (ref 26.0–34.0)
MCHC: 30.9 g/dL (ref 30.0–36.0)
MCV: 85.8 fL (ref 80.0–100.0)
Platelets: 206 10*3/uL (ref 150–400)
RBC: 4.3 MIL/uL (ref 3.87–5.11)
RDW: 13.5 % (ref 11.5–15.5)
WBC: 5.3 10*3/uL (ref 4.0–10.5)
nRBC: 0 % (ref 0.0–0.2)

## 2019-05-10 LAB — BASIC METABOLIC PANEL
Anion gap: 8 (ref 5–15)
BUN: 14 mg/dL (ref 6–20)
CO2: 32 mmol/L (ref 22–32)
Calcium: 9.3 mg/dL (ref 8.9–10.3)
Chloride: 101 mmol/L (ref 98–111)
Creatinine, Ser: 0.69 mg/dL (ref 0.44–1.00)
GFR calc Af Amer: >60 mL/min (ref >60)
GFR calc non Af Amer: >60 mL/min (ref >60)
Glucose, Bld: 103 mg/dL — ABNORMAL HIGH (ref 70–99)
Potassium: 3.7 mmol/L (ref 3.5–5.1)
Sodium: 141 mmol/L (ref 135–145)

## 2019-05-10 NOTE — Progress Notes (Signed)
Met with patient at registration to introduce myself as Arboriculturist and to offer available resources.  Discussed one-time $1000 Radio broadcast assistant to assist while going through treatment.  Gave her my card if interested in applying and for any additional financial questions or concerns.

## 2019-05-13 ENCOUNTER — Ambulatory Visit: Payer: BC Managed Care – PPO | Attending: Surgery | Admitting: Physical Therapy

## 2019-05-13 ENCOUNTER — Other Ambulatory Visit: Payer: Self-pay

## 2019-05-13 ENCOUNTER — Encounter: Payer: Self-pay | Admitting: Physical Therapy

## 2019-05-13 ENCOUNTER — Encounter: Payer: Self-pay | Admitting: Oncology

## 2019-05-13 DIAGNOSIS — M25511 Pain in right shoulder: Secondary | ICD-10-CM | POA: Insufficient documentation

## 2019-05-13 DIAGNOSIS — C50412 Malignant neoplasm of upper-outer quadrant of left female breast: Secondary | ICD-10-CM

## 2019-05-13 DIAGNOSIS — R293 Abnormal posture: Secondary | ICD-10-CM | POA: Diagnosis present

## 2019-05-13 DIAGNOSIS — M25611 Stiffness of right shoulder, not elsewhere classified: Secondary | ICD-10-CM

## 2019-05-13 DIAGNOSIS — Z483 Aftercare following surgery for neoplasm: Secondary | ICD-10-CM

## 2019-05-13 DIAGNOSIS — R600 Localized edema: Secondary | ICD-10-CM | POA: Diagnosis present

## 2019-05-13 DIAGNOSIS — G8929 Other chronic pain: Secondary | ICD-10-CM | POA: Diagnosis present

## 2019-05-13 DIAGNOSIS — Z170 Estrogen receptor positive status [ER+]: Secondary | ICD-10-CM | POA: Insufficient documentation

## 2019-05-13 NOTE — Patient Instructions (Signed)
            [facility]         [address], [place] [address]         [phone]         Annia Friendly, PT, CLT   After Breast Cancer Class Next class 06/03/2019. This will educate you on lymphedema risk reduction practices.    Scar massage Use coconut oil on her incisions to get them to move better and reduce in their appearance.    Compression garment  Consider a Jovi Pak inframammary pad to wear in your sports bra to reduce swelling.   Home exercise Program  Begin doing home exercises to improve your shoulder range of motion.   Follow up PT Call us if you want to begin PT for your right shoulder or if your breast swelling does not reduce in 1 month.

## 2019-05-13 NOTE — Therapy (Signed)
[place], [place], [address] Phone: [phone]   Fax:  [phone]  Physical Therapy Treatment  Patient Details  Name: Loretta Nelson MRN: [id number] Date of Birth: [birth year] Referring Provider (PT): Dr. Donnie Mesa   Encounter Date: 05/13/2019  PT End of Session - 05/13/19 1729    Visit Number  2    Number of Visits  2    PT Start Time  1500    PT Stop Time  1550    PT Time Calculation (min)  50 min    Activity Tolerance  Patient tolerated treatment well    Behavior During Therapy  [facility] for tasks assessed/performed       Past Medical History:  Diagnosis Date  . Allergic rhinitis   . Anemia    low iron  . Anemia   . Anxiety   . Anxiety   . Arthritis    right knee, surgery done  . Cancer ([place]) 2021   left breast   . Complication of anesthesia    woke up during EGD  . Esophageal stricture   . Family history of breast cancer   . GERD (gastroesophageal reflux disease)   . Graves disease 01/13/11   radioactive iodine treatment, 97.0 millicuries  . Headache    tension headaches  . Hiatal hernia    small  . Hypertension   . Hyperthyroidism   . Hypothyroidism    s/p treatment  . Murmur    benign, h/o, caused by hyperdynamic contraction  . Obesity   . Pneumonia 2017   inhaler prescribed  . Sleep apnea 2021   no CPAP as yet, last study done lastnight 04/15/2019  . Tendonitis of shoulder, right   . Vitamin D deficiency   . Vitamin D deficiency     Past Surgical History:  Procedure Laterality Date  . BALLOON DILATION N/A 09/07/2016   Procedure: BALLOON DILATION;  Surgeon: Arta Silence, MD;  Location: [facility] ENDOSCOPY;  Service: Endoscopy;  Laterality: N/A;  . BREAST LUMPECTOMY WITH RADIOACTIVE SEED AND SENTINEL LYMPH NODE BIOPSY Left 04/17/2019   Procedure: LEFT BREAST LUMPECTOMY WITH RADIOACTIVE SEED AND SENTINEL LYMPH NODE BIOPSY;  Surgeon: Donnie Mesa, MD;  Location: [facility];   Service: General;  Laterality: Left;  LMA, PEC BLOCK  . CESAREAN SECTION  1999  . COLONOSCOPY    . ESOPHAGOGASTRODUODENOSCOPY    . ESOPHAGOGASTRODUODENOSCOPY (EGD) WITH PROPOFOL N/A 09/07/2016   Procedure: ESOPHAGOGASTRODUODENOSCOPY (EGD) WITH PROPOFOL;  Surgeon: Arta Silence, MD;  Location: [facility];  Service: Endoscopy;  Laterality: N/A;  . HYSTERECTOMY ABDOMINAL WITH SALPINGECTOMY Bilateral 02/17/2017   Procedure: HYSTERECTOMY ABDOMINAL WITH SALPINGECTOMY;  Surgeon: Christophe Louis, MD;  Location: [facility];  Service: Gynecology;  Laterality: Bilateral;  . KNEE ARTHROSCOPY     for torn meniscus  . WISDOM TOOTH EXTRACTION    . WISDOM TOOTH EXTRACTION      There were no vitals filed for this visit.  Subjective Assessment - 05/13/19 1506    Subjective  Patient reports she underwent a left lumpectomy and sentinel node biopsy on 04/17/2019 with 2 negative nodes removed. Her cancer is triple negative and she is having a port placed on 05/22/2019 and will begin chemo 05/23/2019 followed by radiation. She stood up from work last week and passed out. She felt it was due to it being a stressful day but reached out to her PCP.    Pertinent History  Patient was diagnosed 03/19/2019 with left grade III invasive  ductal carcinoma breast cancer. Patient reports she underwent a left lumpectomy and sentinel node biopsy on 04/17/2019 with 2 negative nodes removed. It is triple negative with a Ki67 of 30%.    Patient Stated Goals  Recheck and see if my arm is ok    Currently in Pain?  Yes    Pain Score  3     Pain Location  Breast    Pain Orientation  Left    Pain Descriptors / Indicators  Tender    Pain Type  Surgical pain    Pain Onset  1 to 4 weeks ago    Pain Frequency  Intermittent    Aggravating Factors   touching the incision area    Pain Relieving Factors  Rest         [facility] PT Assessment - 05/13/19 0001      Assessment   Medical Diagnosis  s/p left lumpectomy     Referring Provider (PT)  Dr.  Donnie Mesa    Onset Date/Surgical Date  04/17/19    Hand Dominance  Right    Prior Therapy  Baselines      Precautions   Precautions  Other (comment)    Precaution Comments  recent surgery; left arm lymphedema risk      Restrictions   Weight Bearing Restrictions  No      Balance Screen   Has the patient fallen in the past 6 months  Yes    How many times?  1    Has the patient had a decrease in activity level because of a fear of falling?   No    Is the patient reluctant to leave their home because of a fear of falling?   No      Home Environment   Living Environment  Private residence    Living Arrangements  Children;Parent   23 y.o. 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 Significant edema present in left breast; recommended breast Jovi Pak. Incisions are well healed.  (Pended)       Posture/Postural Control   Posture/Postural Control  Postural limitations    Postural Limitations  Rounded Shoulders;Forward head    Posture Comments  Also has Dowager's hump      ROM / Strength   AROM / PROM / Strength  AROM      AROM   AROM Assessment Site  Shoulder    Right/Left Shoulder  Left    Left Shoulder Extension  45 Degrees    Left Shoulder Flexion  133 Degrees    Left Shoulder ABduction  141 Degrees    Left Shoulder Internal Rotation  66 Degrees    Left Shoulder External Rotation  71 Degrees        LYMPHEDEMA/ONCOLOGY QUESTIONNAIRE - 05/13/19 1517      Type   Cancer Type  Left breast cancer      Surgeries   Lumpectomy Date  04/17/19    Sentinel Lymph Node Biopsy Date   04/17/19  Number Lymph Nodes Removed  3      Treatment   Active Chemotherapy Treatment  No    Past Chemotherapy Treatment  No    Active Radiation Treatment  No    Past Radiation Treatment  No    Current Hormone Treatment  No    Past Hormone Therapy  No      What other symptoms do you have   Are you Having Heaviness or Tightness  No    Are you having Pain  Yes    Are you having pitting edema  No    Is it Hard or Difficult finding clothes that fit  No    Do you have infections  No    Is there Decreased scar mobility  Yes    Stemmer Sign  No      Lymphedema Assessments   Lymphedema Assessments  Upper extremities      Right Upper Extremity Lymphedema   10 cm Proximal to Olecranon Process  45.9 cm    Olecranon Process  31.7 cm    10 cm Proximal to Ulnar Styloid Process  29.5 cm    Just Proximal to Ulnar Styloid Process  19.4 cm    Across Hand at [employer]  21 cm    At [place] of 2nd Digit  6.9 cm      Left Upper Extremity Lymphedema   10 cm Proximal to Olecranon Process  48.7 cm    Olecranon Process  31.5 cm    10 cm Proximal to Ulnar Styloid Process  29.2 cm    Just Proximal to Ulnar Styloid Process  20.1 cm    Across Hand at [employer]  20.6 cm    At [place] of 2nd Digit  6.7 cm        Quick Dash - 05/13/19 0001    Open a tight or new jar  Mild difficulty    Do heavy household chores (wash walls, wash floors)  Mild difficulty    Carry a shopping bag or briefcase  No difficulty    Wash your back  No difficulty    Use a knife to cut food  No difficulty    Recreational activities in which you take some force or impact through your arm, shoulder, or hand (golf, hammering, tennis)  Mild difficulty    During the past week, to what extent has your arm, shoulder or hand problem interfered with your normal social activities with family, friends, neighbors, or groups?  Slightly    During the past week, to what extent has your arm, shoulder or hand problem limited your work  or other regular daily activities  Slightly    Arm, shoulder, or hand pain.  Moderate    Tingling (pins and needles) in your arm, shoulder, or hand  Moderate    Difficulty Sleeping  Mild difficulty    DASH Score  22.73 %                          PT Long Term Goals - 05/13/19 1735      PT LONG TERM GOAL #1   Title  Patient will report >/= 25% reduction in right scapular and neck pain to tolerate working on her computer with greater ease.    Time  8    Period  Weeks    Status  Not Met      PT LONG TERM GOAL #2   Title  Patient will increase right shoulder internal rotation ROM to >/= 55 degrees for increased ease reaching behind back.    Baseline  32 degrees    Time  8    Period  Weeks    Status  Not Met      PT LONG TERM GOAL #3   Title  Patient will demonstrate and verbalize proper sitting posture for decreased strain on neck and scapula while working on her computer.    Time  8    Period  Weeks    Status  Not Met      PT LONG TERM GOAL #4   Title  Patient will improve her DASH score to be </= 8 for increased function of right upper extremity.    Time  8    Period  Weeks    Status  Not Met      PT LONG TERM GOAL #5   Title  Patient will demonstrate she has regained left shoulder ROM and function post operatively compared to baselines.    Time  8    Period  Weeks    Status  Achieved            Plan - 05/13/19 1730    Clinical Impression Statement  Patient is doing well s/p left lumpectomy and sentinel node biopsy. She is scheduled to begin chemotherapy next week. She has moderate swelling present in her left breast and would benefit from trying a Jovi Pak and then PT for manual lymph drainage if that does not reduce her edema. Her shoulder ROM and function is back to baseline and there is no sign of lymphedema. She has a L-Dex baseline score of 4.3 and plans to return every 3 months to be reassessed with SOZO for an L-Dex score. She plans to begin  chemotherapy and see how her right shoulder issue and her left breast swelling go and then will consider PT if needed.    PT Treatment/Interventions  ADLs/Self Care Home Management;Therapeutic exercise;Manual techniques;Patient/family education;Joint Manipulations;Passive range of motion;Therapeutic activities;Manual lymph drainage    PT Next Visit Plan  L-Dex testing every 3 months. She will contact us if needed for PT for breast edema and/or right shoulder pain.    PT Home Exercise Plan  Post op shoulder ROM HEP    Consulted and Agree with Plan of Care  Patient       Patient will benefit from skilled therapeutic intervention in order to improve the following deficits and impairments:  Postural dysfunction, Decreased range of motion, Pain, Impaired UE functional use, Decreased knowledge of precautions, Increased edema  Visit Diagnosis: Malignant neoplasm of upper-outer quadrant of left breast in female, estrogen receptor positive (HCC)  Abnormal posture  Chronic right shoulder pain  Stiffness of right shoulder, not elsewhere classified  Aftercare following surgery for neoplasm  Localized edema    The patient was assessed using the L-Dex machine today to produce a lymphedema index baseline score. The patient will be reassessed on a regular basis (typically every 3 months) to obtain new L-Dex scores. If the score is > 6.5 points away from his/her baseline score indicating onset of subclinical lymphedema, it will be recommended to wear a compression garment for 4 weeks, 12 hours per day and then be reassessed. If the score continues to be > 6.5 points from baseline at reassessment, we will initiate lymphedema treatment. Assessing in this manner has a 95% rate of preventing clinically significant lymphedema.   Problem List Patient Active Problem  List   Diagnosis Date Noted  . Genetic testing 04/19/2019  . 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 Family history of breast cancer   .  Malignant neoplasm of upper-outer quadrant of left breast in female, estrogen receptor positive ([place]) 04/05/2019  . Flat foot 03/12/2018  . 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 Fibroids, intramural 02/17/2017  . Menorrhagia 02/17/2017  . S/P hysterectomy 02/17/2017   PHYSICAL THERAPY DISCHARGE SUMMARY  Visits from Start of Care: 2  Current functional level related to goals / functional outcomes: See above for objective findings   Remaining deficits: Moderate left breast edema; persistent right shoulder pain   Education / Equipment: Lymphedema risk reduction and post op shoulder ROM Plan: Patient agrees to discharge.  Patient goals were partially met. Patient is being discharged due to                                                     Wanting to begin chemotherapy and see how that goes before jumping into PT.?????          Annia Friendly, [place] 05/13/19 5:39 PM  [place], [place], [address] Phone: [phone]   Fax:  [phone]  Name: Loretta Nelson MRN: [id number] Date of Birth: 1965/08/26

## 2019-05-14 ENCOUNTER — Encounter: Payer: Self-pay | Admitting: *Deleted

## 2019-05-17 NOTE — Progress Notes (Addendum)
Pharmacist Chemotherapy Monitoring - Initial Assessment    Anticipated start date: 05/23/19   Regimen:  . Are orders appropriate based on the patient's diagnosis, regimen, and cycle? Yes . Does the plan date match the patient's scheduled date? Yes . Is the sequencing of drugs appropriate? Yes . Are the premedications appropriate for the patient's regimen? Yes . Prior Authorization for treatment is: Not Started o If applicable, is the correct biosimilar selected based on the patient's insurance? yes  Organ Function and Labs: Marland Kitchen Are dose adjustments needed based on the patient's renal function, hepatic function, or hematologic function? No . Are appropriate labs ordered prior to the start of patient's treatment? Yes . Other organ system assessment, if indicated: N/A . The following baseline labs, if indicated, have been ordered: na   Dose Assessment: . Are the drug doses appropriate? Yes . Are the following correct: o Drug concentrations Yes o IV fluid compatible with drug Yes o Administration routes Yes o Timing of therapy Yes . If applicable, does the patient have documented access for treatment and/or plans for port-a-cath placement? no . If applicable, have lifetime cumulative doses been properly documented and assessed? not applicable Lifetime Dose Tracking  No doses have been documented on this patient for the following tracked chemicals: Doxorubicin, Epirubicin, Idarubicin, Daunorubicin, Mitoxantrone, Bleomycin, Oxaliplatin, Carboplatin, Liposomal Doxorubicin  o   Toxicity Monitoring/Prevention: . The patient has the following take home antiemetics prescribed: Prochlorperazine lorazepam dex . The patient has the following take home medications prescribed: N/A . Medication allergies and previous infusion related reactions, if applicable, have been reviewed and addressed. Yes . The patient's current medication list has been assessed for drug-drug interactions with their  chemotherapy regimen. no significant drug-drug interactions were identified on review.  Order Review: . Are the treatment plan orders signed? No . Is the patient scheduled to see a provider prior to their treatment? Yes  I verify that I have reviewed each item in the above checklist and answered each question accordingly.  Philomena Course 05/17/2019 8:43 AM

## 2019-05-18 ENCOUNTER — Other Ambulatory Visit (HOSPITAL_COMMUNITY)
Admission: RE | Admit: 2019-05-18 | Discharge: 2019-05-18 | Disposition: A | Discharge status: Home / Self Care | Payer: BC Managed Care – PPO | Source: Ambulatory Visit | Attending: Surgery | Admitting: Surgery

## 2019-05-18 DIAGNOSIS — Z20822 Contact with and (suspected) exposure to covid-19: Secondary | ICD-10-CM | POA: Insufficient documentation

## 2019-05-18 DIAGNOSIS — Z01812 Encounter for preprocedural laboratory examination: Secondary | ICD-10-CM | POA: Insufficient documentation

## 2019-05-18 LAB — SARS CORONAVIRUS 2 (TAT 6-24 HRS): SARS Coronavirus 2: NEGATIVE

## 2019-05-19 NOTE — Progress Notes (Signed)
[place]  [phone] [phone]     ID: Loretta Nelson Dearinger DOB: [birth year]  MR#: [id number]  NID#:[id number]  Patient Care Team: [name], MD as PCP - General (Family Medicine) Mauro Kaufmann, RN as Oncology Nurse Navigator Rockwell Germany, RN as Oncology Nurse Navigator Donnie Mesa, MD as Consulting Physician (General Surgery) Alfons Sulkowski, Virgie Dad, MD as Consulting Physician (Oncology) Kyung Rudd, MD as Consulting Physician (Radiation Oncology) Delrae Rend, MD as Consulting Physician (Endocrinology) Star Age, MD as Consulting Physician (Neurology) Garvin Fila, MD as Consulting Physician (Neurology) Christophe Louis, MD as Consulting Physician (Obstetrics and Gynecology) Chauncey Cruel, MD OTHER MD:  CHIEF COMPLAINT: triple negative breast cancer  CURRENT TREATMENT: Adjuvant chemotherapy   INTERVAL HISTORY: [name] returns today for follow up of her triple negative breast cancer accompanied by her daughter Loretta Nelson.   She is scheduled to begin her adjuvant chemotherapy consisting of cyclophosphamide and docetaxel on 05/23/2019. The plan is for her to receive this every 21 days for 4 cycles. She will undergo port placement on 05/22/2019 under Dr. Georgette Dover.   She has now received both of her Covid-19 vaccine doses, on 04/27/2019 and 05/18/2019.  She had some mild reactions to the second dose but those cleared with a dose of Tylenol.   REVIEW OF SYSTEMS: Junice tells me that she fell on March 11.  She had been dealing with variety of complicated things and stood up was walking out of the room and pretty much passed out for about 2 seconds.  She does not recall hitting the floor.  She hit the floor on her right side.  She did not injure herself otherwise.  We have looked through her medicines to see if any of them could be doing this.  She is also trying to hydrate herself a little bit better.  She is very emotional about the upcoming  treatments.  She also is having some epigastric discomfort.  She did bring up some papers for [facility] and those are still pending.   HISTORY OF CURRENT ILLNESS: From the original intake note:  Loretta Nelson had routine screening mammography on 03/19/2019 showing a possible abnormality in the left breast. She underwent left diagnostic mammography with tomography and left breast ultrasonography at [facility] on 03/29/2019 showing: breast density category B; 8 mm mass in left breast at 12 o'clock; left axilla negative for lymphadenopathy.  Accordingly on 04/03/2019 she proceeded to biopsy of the left breast area in question. The pathology from this procedure ([id number]) showed: invasive mammary carcinoma, grade 3, e-cadherin positive. Prognostic indicators significant for: estrogen receptor, 10% positive with weak staining intensity and progesterone receptor, 0% negative. Proliferation marker Ki67 at 30%. HER2 negative by immunohistochemistry (1+).  The patient's subsequent history is as detailed below.   PAST MEDICAL HISTORY: Past Medical History:  Diagnosis Date  . Allergic rhinitis   . Anemia    low iron  . Anemia   . Anxiety   . Anxiety   . Arthritis    right knee, surgery done  . Cancer ([place]) 2021   left breast   . Complication of anesthesia    woke up during EGD  . Esophageal stricture   . Family history of breast cancer   . GERD (gastroesophageal reflux disease)   . Graves disease 01/13/11   radioactive iodine treatment, 86.7 millicuries  . Headache    tension headaches  . Hiatal hernia    small  . Hypertension   .  Hyperthyroidism   . Hypothyroidism    s/p treatment  . Murmur    benign, h/o, caused by hyperdynamic contraction  . Obesity   . Pneumonia 2017   inhaler prescribed  . Sleep apnea 2021   no CPAP as yet, last study done lastnight 04/15/2019  . Tendonitis of shoulder, right   . Vitamin D deficiency   . Vitamin D deficiency     PAST SURGICAL  HISTORY: Past Surgical History:  Procedure Laterality Date  . BALLOON DILATION N/A 09/07/2016   Procedure: BALLOON DILATION;  Surgeon: Arta Silence, MD;  Location: [facility] ENDOSCOPY;  Service: Endoscopy;  Laterality: N/A;  . BREAST LUMPECTOMY WITH RADIOACTIVE SEED AND SENTINEL LYMPH NODE BIOPSY Left 04/17/2019   Procedure: LEFT BREAST LUMPECTOMY WITH RADIOACTIVE SEED AND SENTINEL LYMPH NODE BIOPSY;  Surgeon: Donnie Mesa, MD;  Location: [place];  Service: General;  Laterality: Left;  LMA, PEC BLOCK  . CESAREAN SECTION  1999  . COLONOSCOPY    . ESOPHAGOGASTRODUODENOSCOPY    . ESOPHAGOGASTRODUODENOSCOPY (EGD) WITH PROPOFOL N/A 09/07/2016   Procedure: ESOPHAGOGASTRODUODENOSCOPY (EGD) WITH PROPOFOL;  Surgeon: Arta Silence, MD;  Location: [place];  Service: Endoscopy;  Laterality: N/A;  . HYSTERECTOMY ABDOMINAL WITH SALPINGECTOMY Bilateral 02/17/2017   Procedure: HYSTERECTOMY ABDOMINAL WITH SALPINGECTOMY;  Surgeon: Christophe Louis, MD;  Location: [facility];  Service: Gynecology;  Laterality: Bilateral;  . KNEE ARTHROSCOPY     for torn meniscus  . WISDOM TOOTH EXTRACTION    . WISDOM TOOTH EXTRACTION      FAMILY HISTORY: Family History  Problem Relation Age of Onset  . Hypertension Mother   . CAD Maternal Grandfather   . Breast cancer Paternal Grandmother   . HIV Brother   . Heart attack Brother        sudden MI vs PE  . Breast cancer Sister 42  . Breast cancer Other        one dx 37s, others dx in 36s  . Colon polyps Neg Hx   . Colon cancer Neg Hx   . Liver disease Neg Hx    Patient's father was in his late 90s when he died from causes unknown to the patient.  Patient's mother is 54 years old (as of 04/2019). The patient denies a family hx of ovarian cancer. She reports breast cancer in her sister at age 50, in her paternal grandmother, and in 3 paternal cousins. She had 4 siblings, 2 brothers and 2 sisters, though only her sisters are still living.   GYNECOLOGIC HISTORY:  Patient's last  menstrual period was 02/13/2017 (exact date). Menarche: 54 years old Age at first live birth: 54 years old [place] P 2 (twins) LMP 01/2017 Contraceptive: used from 9150-5697 without complication HRT never used  Hysterectomy? Yes, 01/2017, benign pathology BSO? 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 She is divorced. She lives at home with her twin daughters, Maryjo Rochester and Edmon Crape, who are 54 years old fraternal twins and attending college remotely. Maryjo Rochester is studying music and communications at [employer].Edmon Crape is studying IT.    ADVANCED DIRECTIVES: Not in place; the patient intends to name her sister Madelon Lips as her HCPOA [phone])   HEALTH MAINTENANCE: Social History   Tobacco Use  . Smoking status: Never Smoker  . Smokeless tobacco: Never Used  Substance Use Topics  . Alcohol use: No    Alcohol/week: 0.0 standard drinks  . Drug  use: No     Colonoscopy: at age 68  PAP: 02/2018  Bone density: never done   No Known Allergies  Current Outpatient Medications  Medication Sig Dispense Refill  . amLODipine (NORVASC) 5 MG tablet Take 5 mg by mouth daily.   5  . Cholecalciferol 4000 UNITS CAPS Take 4,000 Units by mouth daily.     . citalopram (CELEXA) 40 MG tablet Take 40 mg by mouth daily.    Marland Kitchen dexamethasone (DECADRON) 4 MG tablet Take 2 tablets (8 mg total) by mouth 2 (two) times daily. Start the day before Taxotere. Then again the day after chemo for 3 days. 30 tablet 1  . EDARBYCLOR 40-25 MG TABS Take 1 tablet by mouth daily.  5  . HYDROcodone-acetaminophen (NORCO/VICODIN) 5-325 MG tablet Take 1 tablet by mouth every 6 (six) hours as needed for moderate pain. (Patient not taking: Reported on 05/09/2019) 15 tablet 0  . ibuprofen (ADVIL) 200 MG tablet Take 400 mg by mouth every 6 (six) hours as needed for moderate pain.    Marland Kitchen KLOR-CON M20 20 MEQ tablet Take 20  mEq by mouth 2 (two) times daily.   1  . L-Methylfolate-B12-B6-B2 (CEREFOLIN) 07-29-48-5 MG TABS Take 1 tablet by mouth every morning. 90 tablet 3  . levothyroxine (SYNTHROID) 175 MCG tablet Take 175 mcg by mouth daily before breakfast.   5  . lidocaine-prilocaine (EMLA) cream Apply to affected area once (Patient taking differently: Apply 1 application topically daily as needed (port access). ) 30 g 3  . loratadine (CLARITIN) 10 MG tablet TAKE 1 TABLET BY MOUTH EVERY DAY (Patient taking differently: Take 10 mg by mouth daily. ) 90 tablet 1  . LORazepam (ATIVAN) 0.5 MG tablet Take 1 tablet (0.5 mg total) by mouth every 6 (six) hours as needed (Nausea or vomiting). 30 tablet 0  . MELATONIN PO Take 1 tablet by mouth at bedtime as needed (sleep).    . metoprolol (TOPROL-XL) 200 MG 24 hr tablet Take 200 mg by mouth daily.   5  . omeprazole (PRILOSEC) 40 MG capsule Take 40 mg by mouth See admin instructions. Take 40 mg daily, may take a second 40 mg dose as needed for acid reflux    . prochlorperazine (COMPAZINE) 10 MG tablet Take 1 tablet (10 mg total) by mouth every 6 (six) hours as needed (Nausea or vomiting). 30 tablet 1  . spironolactone (ALDACTONE) 50 MG tablet Take 50 mg by mouth daily.     No current facility-administered medications for this visit.    OBJECTIVE: African-American woman who appears younger than stated age  32:   05/20/19 0840  BP: (!) 153/73  Pulse: (!) 58  Resp: 20  Temp: 98 F (36.7 C)  SpO2: 99%     Body mass index is 52.52 kg/m.   Wt Readings from Last 3 Encounters:  05/20/19 (!) 306 lb (138.8 kg)  05/10/19 (!) 306 lb (138.8 kg)  05/03/19 (!) 307 lb 9.6 oz (139.5 kg)      ECOG FS:1 - Symptomatic but completely ambulatory  Sclerae unicteric, EOMs intact Wearing a mask No cervical or supraclavicular adenopathy Lungs no rales or rhonchi Heart regular rate and rhythm Abd soft, nontender, positive bowel sounds MSK no focal spinal tenderness, no upper  extremity lymphedema Neuro: nonfocal, well oriented, appropriate affect Breasts: The right breast is benign.  The left breast has undergone lumpectomy.  The cosmetic result is excellent.  There is no evidence of residual or recurrent disease.  Both axillae are benign.   LAB RESULTS:  CMP     Component Value Date/Time   NA 141 05/10/2019 1033   K 3.7 05/10/2019 1033   CL 101 05/10/2019 1033   CO2 32 05/10/2019 1033   GLUCOSE 103 (H) 05/10/2019 1033   BUN 14 05/10/2019 1033   CREATININE 0.69 05/10/2019 1033   CREATININE 0.83 04/10/2019 1240   CALCIUM 9.3 05/10/2019 1033   PROT 7.4 04/10/2019 1240   ALBUMIN 3.8 04/10/2019 1240   AST 17 04/10/2019 1240   ALT 19 04/10/2019 1240   ALKPHOS 64 04/10/2019 1240   BILITOT 0.4 04/10/2019 1240   GFRNONAA >60 05/10/2019 1033   GFRNONAA >60 04/10/2019 1240   GFRAA >60 05/10/2019 1033   GFRAA >60 04/10/2019 1240    No results found for: TOTALPROTELP, ALBUMINELP, A1GS, A2GS, BETS, BETA2SER, GAMS, MSPIKE, SPEI  Lab Results  Component Value Date   WBC 5.3 05/10/2019   NEUTROABS 3.1 04/10/2019   HGB 11.4 (L) 05/10/2019   HCT 36.9 05/10/2019   MCV 85.8 05/10/2019   PLT 206 05/10/2019    No results found for: LABCA2  No components found for: [id number]  No results for input(s): INR in the last 168 hours.  No results found for: LABCA2  No results found for: WNU272  No results found for: ZDG644  No results found for: IHK742  No results found for: CA2729  No components found for: HGQUANT  No results found for: CEA1 / No results found for: CEA1   No results found for: AFPTUMOR  No results found for: CHROMOGRNA  No results found for: KPAFRELGTCHN, LAMBDASER, KAPLAMBRATIO (kappa/lambda light chains)  No results found for: HGBA, HGBA2QUANT, HGBFQUANT, HGBSQUAN (Hemoglobinopathy evaluation)   No results found for: LDH  No results found for: IRON, TIBC, IRONPCTSAT (Iron and TIBC)  No results found for: FERRITIN   Urinalysis No results found for: COLORURINE, APPEARANCEUR, LABSPEC, PHURINE, GLUCOSEU, HGBUR, BILIRUBINUR, KETONESUR, PROTEINUR, UROBILINOGEN, NITRITE, LEUKOCYTESUR   STUDIES: No results found.   ELIGIBLE FOR AVAILABLE RESEARCH PROTOCOL:BR003?--Depending on final surgical pathology  ASSESSMENT: 54 y.o. [place] woman status post left breast upper outer quadrant biopsy 04/03/2019 for a clinical T1b N0, stage IB invasive ductal carcinoma, grade 3, functionally triple negative (estrogen receptor is weakly positive at 10%, progesterone receptor negative, HER-2 not amplified), with an MIB-1 of 30%  (1) status post left lumpectomy and sentinel lymph node sampling 04/17/2019 for a pT1c pN0, stage IB invasive ductal carcinoma, grade 3, triple negative, with negative margins  (a) a total of 2 sentinel lymph nodes were removed  (2) adjuvant chemotherapy consisting of cyclophosphamide and docetaxel every 21 days x 4 to start 05/23/2019  (3) adjuvant radiation to follow  (4) genetics testing 04/19/2019 through the [facility] Breast Cancer STAT Panel + Common Hereditary Cancers Panel 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 STK11, TP53, TSC1, TSC2, and VHL.  The following genes were evaluated for sequence changes only: SDHA and HOXB13 c.251G>A variant only.  (b) current data suggests an increase  risk of breast and ovarian cancer in patients  carrying a BARD1 mutation, but data is preliminary and insufficient for definitive recommendations   PLAN: Hansika is understandably concerned about the treatment she is about to start.  She has a good understanding of the possible toxicities side effects and complications and greatly benefited from meeting with our chemotherapy teaching nurse.  She pointed out a small air in the handout and I will bring that to their attention.  She has some FMLA papers pending and we will make sure those get done by the time she returns here to see me.  She will have her port placed 05/22/2019 and the needle will be left in so she will be already accessed by the time she returns for treatment the next day.  I reassured her that the needle will not pierce the port even if she sleeps on it.  I asked her to keep a diary of symptoms so when she returns to see me on day 8 or so after the first cycle we can correct any problems for future cycles  She knows to call for any other issue that may develop before then.  Total encounter time 25 minutes.Chauncey Cruel, MD   05/20/2019 9:01 AM Medical Oncology and Hematology [facility] [place], [place] [address] Tel. [phone]    Fax. [phone]   This document serves as a record of services personally performed by Lurline Del, MD. It was created on his behalf by Wilburn Mylar, a trained medical scribe. The creation of this record is based on the scribe's personal observations and the provider's statements to them.   I, Lurline Del MD, have reviewed the above documentation for accuracy and completeness, and I agree with the above.   *Total Encounter Time as defined by the Centers for Medicare and Medicaid Services includes, in addition to the face-to-face time of a patient visit (documented in the note above) non-face-to-face time: obtaining and reviewing outside history, ordering and reviewing medications, tests  or procedures, care coordination (communications with other health care professionals or caregivers) and documentation in the medical record.

## 2019-05-20 ENCOUNTER — Inpatient Hospital Stay: Payer: BC Managed Care – PPO | Admitting: Oncology

## 2019-05-20 ENCOUNTER — Other Ambulatory Visit: Payer: Self-pay

## 2019-05-20 VITALS — BP 153/73 | HR 58 | Temp 98.0°F | Resp 20 | Ht 64.0 in | Wt 306.0 lb

## 2019-05-20 DIAGNOSIS — Z1501 Genetic susceptibility to malignant neoplasm of breast: Secondary | ICD-10-CM

## 2019-05-20 DIAGNOSIS — Z170 Estrogen receptor positive status [ER+]: Secondary | ICD-10-CM | POA: Diagnosis not present

## 2019-05-20 DIAGNOSIS — C50412 Malignant neoplasm of upper-outer quadrant of left female breast: Secondary | ICD-10-CM | POA: Diagnosis not present

## 2019-05-20 DIAGNOSIS — Z90710 Acquired absence of both cervix and uterus: Secondary | ICD-10-CM | POA: Diagnosis not present

## 2019-05-21 ENCOUNTER — Telehealth (HOSPITAL_COMMUNITY): Payer: Self-pay | Admitting: *Deleted

## 2019-05-21 ENCOUNTER — Telehealth: Payer: Self-pay | Admitting: Oncology

## 2019-05-21 MED ORDER — DEXTROSE 5 % IV SOLN
3.0000 g | INTRAVENOUS | Status: AC
  Administered 2019-05-22: 3 g via INTRAVENOUS
  Filled 2019-05-21: qty 3

## 2019-05-21 NOTE — Telephone Encounter (Signed)
No changes made. No 3/22 los

## 2019-05-22 ENCOUNTER — Ambulatory Visit (HOSPITAL_COMMUNITY): Payer: BC Managed Care – PPO | Admitting: Registered Nurse

## 2019-05-22 ENCOUNTER — Ambulatory Visit (HOSPITAL_COMMUNITY)
Admission: RE | Admit: 2019-05-22 | Discharge: 2019-05-22 | Disposition: A | Discharge status: Home / Self Care | Payer: BC Managed Care – PPO | Attending: Surgery | Admitting: Surgery

## 2019-05-22 ENCOUNTER — Encounter (HOSPITAL_COMMUNITY): Payer: Self-pay | Admitting: Surgery

## 2019-05-22 ENCOUNTER — Ambulatory Visit (HOSPITAL_COMMUNITY): Payer: BC Managed Care – PPO

## 2019-05-22 ENCOUNTER — Encounter (HOSPITAL_COMMUNITY)
Admission: RE | Disposition: A | Discharge status: Home / Self Care | Payer: Self-pay | Source: Home / Self Care | Attending: Surgery

## 2019-05-22 ENCOUNTER — Ambulatory Visit: Payer: BC Managed Care – PPO | Admitting: Oncology

## 2019-05-22 DIAGNOSIS — Z171 Estrogen receptor negative status [ER-]: Secondary | ICD-10-CM | POA: Insufficient documentation

## 2019-05-22 DIAGNOSIS — E0590 Thyrotoxicosis, unspecified without thyrotoxic crisis or storm: Secondary | ICD-10-CM | POA: Insufficient documentation

## 2019-05-22 DIAGNOSIS — D649 Anemia, unspecified: Secondary | ICD-10-CM | POA: Insufficient documentation

## 2019-05-22 DIAGNOSIS — K449 Diaphragmatic hernia without obstruction or gangrene: Secondary | ICD-10-CM | POA: Diagnosis not present

## 2019-05-22 DIAGNOSIS — Z452 Encounter for adjustment and management of vascular access device: Secondary | ICD-10-CM

## 2019-05-22 DIAGNOSIS — Z79899 Other long term (current) drug therapy: Secondary | ICD-10-CM | POA: Diagnosis not present

## 2019-05-22 DIAGNOSIS — Z803 Family history of malignant neoplasm of breast: Secondary | ICD-10-CM | POA: Diagnosis not present

## 2019-05-22 DIAGNOSIS — K219 Gastro-esophageal reflux disease without esophagitis: Secondary | ICD-10-CM | POA: Insufficient documentation

## 2019-05-22 DIAGNOSIS — E039 Hypothyroidism, unspecified: Secondary | ICD-10-CM | POA: Insufficient documentation

## 2019-05-22 DIAGNOSIS — C50912 Malignant neoplasm of unspecified site of left female breast: Secondary | ICD-10-CM | POA: Insufficient documentation

## 2019-05-22 DIAGNOSIS — I10 Essential (primary) hypertension: Secondary | ICD-10-CM | POA: Diagnosis not present

## 2019-05-22 HISTORY — PX: PORTACATH PLACEMENT: SHX2246

## 2019-05-22 SURGERY — INSERTION, TUNNELED CENTRAL VENOUS DEVICE, WITH PORT
Anesthesia: General | Site: Chest | Laterality: Right

## 2019-05-22 MED ORDER — ROCURONIUM BROMIDE 10 MG/ML (PF) SYRINGE
PREFILLED_SYRINGE | INTRAVENOUS | Status: DC | PRN
  Administered 2019-05-22: 30 mg via INTRAVENOUS

## 2019-05-22 MED ORDER — MIDAZOLAM HCL 5 MG/5ML IJ SOLN
INTRAMUSCULAR | Status: DC | PRN
  Administered 2019-05-22: 2 mg via INTRAVENOUS

## 2019-05-22 MED ORDER — SUCCINYLCHOLINE CHLORIDE 200 MG/10ML IV SOSY
PREFILLED_SYRINGE | INTRAVENOUS | Status: DC | PRN
  Administered 2019-05-22: 140 mg via INTRAVENOUS

## 2019-05-22 MED ORDER — GLYCOPYRROLATE 0.2 MG/ML IJ SOLN
INTRAMUSCULAR | Status: DC | PRN
  Administered 2019-05-22: .1 mg via INTRAVENOUS

## 2019-05-22 MED ORDER — CHLORHEXIDINE GLUCONATE CLOTH 2 % EX PADS: 6.0000 | MEDICATED_PAD | Freq: Once | CUTANEOUS | Status: DC

## 2019-05-22 MED ORDER — LIDOCAINE 2% (20 MG/ML) 5 ML SYRINGE
INTRAMUSCULAR | Status: AC
  Filled 2019-05-22: qty 5

## 2019-05-22 MED ORDER — FENTANYL CITRATE (PF) 100 MCG/2ML IJ SOLN
INTRAMUSCULAR | Status: AC
  Filled 2019-05-22: qty 2

## 2019-05-22 MED ORDER — BUPIVACAINE HCL 0.25 % IJ SOLN
INTRAMUSCULAR | Status: DC | PRN
  Administered 2019-05-22: 10 mL

## 2019-05-22 MED ORDER — LACTATED RINGERS IV SOLN: INTRAVENOUS | Status: DC

## 2019-05-22 MED ORDER — ONDANSETRON HCL 4 MG/2ML IJ SOLN
INTRAMUSCULAR | Status: AC
  Filled 2019-05-22: qty 2

## 2019-05-22 MED ORDER — FENTANYL CITRATE (PF) 100 MCG/2ML IJ SOLN
INTRAMUSCULAR | Status: DC | PRN
  Administered 2019-05-22 (×2): 50 ug via INTRAVENOUS

## 2019-05-22 MED ORDER — DEXAMETHASONE SODIUM PHOSPHATE 10 MG/ML IJ SOLN
INTRAMUSCULAR | Status: AC
  Filled 2019-05-22: qty 1

## 2019-05-22 MED ORDER — LIDOCAINE 2% (20 MG/ML) 5 ML SYRINGE
INTRAMUSCULAR | Status: DC | PRN
  Administered 2019-05-22: 100 mg via INTRAVENOUS

## 2019-05-22 MED ORDER — ROCURONIUM BROMIDE 10 MG/ML (PF) SYRINGE
PREFILLED_SYRINGE | INTRAVENOUS | Status: AC
  Filled 2019-05-22: qty 10

## 2019-05-22 MED ORDER — MIDAZOLAM HCL 2 MG/2ML IJ SOLN
INTRAMUSCULAR | Status: AC
  Filled 2019-05-22: qty 2

## 2019-05-22 MED ORDER — SUGAMMADEX SODIUM 200 MG/2ML IV SOLN
INTRAVENOUS | Status: DC | PRN
  Administered 2019-05-22: 400 mg via INTRAVENOUS

## 2019-05-22 MED ORDER — DEXAMETHASONE SODIUM PHOSPHATE 10 MG/ML IJ SOLN
INTRAMUSCULAR | Status: DC | PRN
  Administered 2019-05-22: 10 mg via INTRAVENOUS

## 2019-05-22 MED ORDER — FENTANYL CITRATE (PF) 100 MCG/2ML IJ SOLN
25.0000 ug | INTRAMUSCULAR | Status: DC | PRN
  Administered 2019-05-22 (×2): 50 ug via INTRAVENOUS

## 2019-05-22 MED ORDER — ONDANSETRON HCL 4 MG/2ML IJ SOLN
INTRAMUSCULAR | Status: DC | PRN
  Administered 2019-05-22: 4 mg via INTRAVENOUS

## 2019-05-22 MED ORDER — GLYCOPYRROLATE PF 0.2 MG/ML IJ SOSY
PREFILLED_SYRINGE | INTRAMUSCULAR | Status: AC
  Filled 2019-05-22: qty 1

## 2019-05-22 MED ORDER — PROPOFOL 10 MG/ML IV BOLUS
INTRAVENOUS | Status: DC | PRN
  Administered 2019-05-22: 200 mg via INTRAVENOUS
  Administered 2019-05-22: 50 mg via INTRAVENOUS

## 2019-05-22 MED ORDER — SUCCINYLCHOLINE CHLORIDE 200 MG/10ML IV SOSY
PREFILLED_SYRINGE | INTRAVENOUS | Status: AC
  Filled 2019-05-22: qty 10

## 2019-05-22 MED ORDER — HEPARIN SOD (PORK) LOCK FLUSH 100 UNIT/ML IV SOLN
INTRAVENOUS | Status: DC | PRN
  Administered 2019-05-22: 300 [IU]

## 2019-05-22 MED ORDER — HEPARIN SOD (PORK) LOCK FLUSH 100 UNIT/ML IV SOLN
INTRAVENOUS | Status: AC
  Filled 2019-05-22: qty 5

## 2019-05-22 MED ORDER — SODIUM CHLORIDE 0.9 % IV SOLN
Freq: Once | INTRAVENOUS | Status: AC
  Administered 2019-05-22: 8 mL
  Filled 2019-05-22: qty 1.2

## 2019-05-22 MED ORDER — BUPIVACAINE HCL (PF) 0.25 % IJ SOLN
INTRAMUSCULAR | Status: AC
  Filled 2019-05-22: qty 30

## 2019-05-22 SURGICAL SUPPLY — 47 items
APL SKNCLS STERI-STRIP NONHPOA (GAUZE/BANDAGES/DRESSINGS) ×1
BAG DECANTER FOR FLEXI CONT (MISCELLANEOUS) ×2 IMPLANT
BENZOIN TINCTURE PRP APPL 2/3 (GAUZE/BANDAGES/DRESSINGS) ×2 IMPLANT
BLADE HEX COATED 2.75 (ELECTRODE) ×2 IMPLANT
BLADE SURG 15 STRL LF DISP TIS (BLADE) ×1 IMPLANT
BLADE SURG 15 STRL SS (BLADE) ×2
BLADE SURG SZ11 CARB STEEL (BLADE) ×2 IMPLANT
CLSR STERI-STRIP ANTIMIC 1/2X4 (GAUZE/BANDAGES/DRESSINGS) ×1 IMPLANT
COVER SURGICAL LIGHT HANDLE (MISCELLANEOUS) ×2 IMPLANT
COVER WAND RF STERILE (DRAPES) IMPLANT
DRAPE C-ARM 42X120 X-RAY (DRAPES) ×2 IMPLANT
DRAPE LAPAROSCOPIC ABDOMINAL (DRAPES) ×2 IMPLANT
DRAPE UTILITY XL STRL (DRAPES) ×2 IMPLANT
DRSG TEGADERM 2-3/8X2-3/4 SM (GAUZE/BANDAGES/DRESSINGS) IMPLANT
DRSG TEGADERM 4X4.75 (GAUZE/BANDAGES/DRESSINGS) ×1 IMPLANT
ELECT REM PT RETURN 15FT ADLT (MISCELLANEOUS) ×2 IMPLANT
GAUZE 4X4 16PLY RFD (DISPOSABLE) ×2 IMPLANT
GAUZE SPONGE 2X2 8PLY STRL LF (GAUZE/BANDAGES/DRESSINGS) IMPLANT
GAUZE SPONGE 4X4 12PLY STRL (GAUZE/BANDAGES/DRESSINGS) IMPLANT
GLOVE BIO SURGEON STRL SZ7 (GLOVE) ×2 IMPLANT
GLOVE BIOGEL PI IND STRL 7.0 (GLOVE) ×1 IMPLANT
GLOVE BIOGEL PI IND STRL 7.5 (GLOVE) ×1 IMPLANT
GLOVE BIOGEL PI INDICATOR 7.0 (GLOVE) ×1
GLOVE BIOGEL PI INDICATOR 7.5 (GLOVE) ×1
GOWN STRL REUS W/TWL LRG LVL3 (GOWN DISPOSABLE) ×4 IMPLANT
GOWN STRL REUS W/TWL XL LVL3 (GOWN DISPOSABLE) ×2 IMPLANT
KIT BASIN OR (CUSTOM PROCEDURE TRAY) ×2 IMPLANT
KIT PORT POWER 8FR ISP CVUE (Port) ×1 IMPLANT
KIT TURNOVER KIT A (KITS) IMPLANT
NDL HYPO 25X1 1.5 SAFETY (NEEDLE) ×1 IMPLANT
NEEDLE HYPO 25X1 1.5 SAFETY (NEEDLE) ×2 IMPLANT
NS IRRIG 1000ML POUR BTL (IV SOLUTION) ×2 IMPLANT
PACK BASIC VI WITH GOWN DISP (CUSTOM PROCEDURE TRAY) ×2 IMPLANT
PENCIL SMOKE EVACUATOR (MISCELLANEOUS) IMPLANT
SPONGE GAUZE 2X2 STER 10/PKG (GAUZE/BANDAGES/DRESSINGS) ×1
STRIP CLOSURE SKIN 1/2X4 (GAUZE/BANDAGES/DRESSINGS) ×2 IMPLANT
SUT MNCRL AB 4-0 PS2 18 (SUTURE) ×2 IMPLANT
SUT PROLENE 2 0 SH DA (SUTURE) ×4 IMPLANT
SUT SILK 2 0 (SUTURE)
SUT SILK 2-0 30XBRD TIE 12 (SUTURE) IMPLANT
SUT VIC AB 3-0 SH 27 (SUTURE)
SUT VIC AB 3-0 SH 27XBRD (SUTURE) IMPLANT
SYR 10ML LL (SYRINGE) ×2 IMPLANT
SYR BULB IRRIGATION 50ML (SYRINGE) IMPLANT
SYR CONTROL 10ML LL (SYRINGE) ×2 IMPLANT
TOWEL OR 17X26 10 PK STRL BLUE (TOWEL DISPOSABLE) ×2 IMPLANT
WATER STERILE IRR 1000ML POUR (IV SOLUTION) IMPLANT

## 2019-05-22 NOTE — Op Note (Signed)
Preop diagnosis: Left breast cancer, triple negative Postop diagnosis: Same Procedure performed: Right internal jugular vein port placement under ultrasound and fluoroscopic guidance Surgeon:Vali Capano K Annaliza Zia Anesthesia: General Indications: This is a 54 year old female who was recently diagnosed with triple negative invasive ductal carcinoma of the left breast.  She underwent lumpectomy and sentinel lymph node biopsy.  She is planning chemotherapy and we are asked to place a port for intravenous access.  Description of procedure: The patient is brought to the operating room and placed in the supine position on the operating room table.  A roll was placed behind her shoulders.  Her arms were tucked at her sides.  After adequate level general anesthesia was obtained, her right neck and chest were prepped with ChloraPrep and draped sterile fashion.  A timeout was taken to ensure the proper patient and proper procedure.  Ultrasound was used to interrogate the neck.  I identified the right internal jugular vein.  An 18-gauge needle was used to cannulate the jugular vein under direct guidance.  There was aspiration of venous blood.  We passed a wire through the needle.  Under fluoroscopic guidance we saw this had down the right side of the mediastinum.  The needle was removed.  We created a subcutaneous pocket below the right clavicle.  We then tunneled a catheter subcutaneously from the subcutaneous pocket to the insertion site on the neck.  An 8 French [place] port was assembled and the catheter was tunneled through the subcutaneous tunnel.  The port was placed within the subcutaneous pocket.  We cut the catheter to the appropriate length using fluoroscopic guidance.  The dilator and breakaway sheath were then passed over the wire.  The wire and dilator were removed.  The catheter was advanced through the breakaway sheath which was then removed.  Fluoroscopy showed that there were no kinks along the course of  the catheter.  The tip of the catheter is at the cavoatrial junction.  We were able aspirate blood easily and were able to flush the catheter easily.  The port was then secured with 2-0 Prolene sutures.  The wound was closed with 3-0 Vicryl.  4-0 Monocryl was used to close the incision as well as the insertion site at the neck.  Benzoin Steri-Strips were applied.  The port was then accessed for chemotherapy tomorrow.  The patient was then extubated and brought to recovery room in stable condition.  All sponge, instrument, and needle counts are correct.  Imogene Burn. Georgette Dover, MD, [facility]  General/ Trauma Surgery   05/22/2019 9:45 AM

## 2019-05-22 NOTE — Discharge Instructions (Signed)
    PORT-A-CATH: POST OP INSTRUCTIONS  Always review your discharge instruction sheet given to you by the facility where your surgery was performed.   1. A prescription for pain medication may be given to you upon discharge. Take your pain medication as prescribed, if needed. If narcotic pain medicine is not needed, then you make take acetaminophen (Tylenol) or ibuprofen (Advil) as needed.  2. Take your usually prescribed medications unless otherwise directed. 3. If you need a refill on your pain medication, please contact our office. All narcotic pain medicine now requires a paper prescription.  Phoned in and fax refills are no longer allowed by law.  Prescriptions will not be filled after 5 pm or on weekends.  4. You should follow a light diet for the remainder of the day after your procedure. 5. Most patients will experience some mild swelling and/or bruising in the area of the incision. It may take several days to resolve. 6. It is common to experience some constipation if taking pain medication after surgery. Increasing fluid intake and taking a stool softener (such as Colace) will usually help or prevent this problem from occurring. A mild laxative (Milk of Magnesia or Miralax) should be taken according to package directions if there are no bowel movements after 48 hours.  7. Unless discharge instructions indicate otherwise, you may remove your bandages 48 hours after surgery, and you may shower at that time. You may have steri-strips (small white skin tapes) in place directly over the incision.  These strips should be left on the skin for 7-10 days.  If your surgeon used Dermabond (skin glue) on the incision, you may shower in 24 hours.  The glue will flake off over the next 2-3 weeks.  8. If your port is left accessed at the end of surgery (needle left in port), the dressing cannot get wet and should only by changed by a healthcare professional. When the port is no longer accessed (when the  needle has been removed), follow step 7.   9. ACTIVITIES:  Limit activity involving your arms for the next 72 hours. Do no strenuous exercise or activity for 1 week. You may drive when you are no longer taking prescription pain medication, you can comfortably wear a seatbelt, and you can maneuver your car. 10.You may need to see your doctor in the office for a follow-up appointment.  Please       check with your doctor.  11.When you receive a new Port-a-Cath, you will get a product guide and        ID card.  Please keep them in case you need them.  WHEN TO CALL YOUR DOCTOR ([phone]): 1. Fever over 101.0 2. Chills 3. Continued bleeding from incision 4. Increased redness and tenderness at the site 5. Shortness of breath, difficulty breathing   The clinic staff is available to answer your questions during regular business hours. Please don't hesitate to call and ask to speak to one of the nurses or medical assistants for clinical concerns. If you have a medical emergency, go to the nearest emergency room or call 911.  A surgeon from [facility] is always on call at the hospital.     For further information, please visit [email or web address]      

## 2019-05-22 NOTE — Anesthesia Procedure Notes (Signed)
Procedure Name: Intubation Date/Time: 05/22/2019 8:38 AM Performed by: Talbot Grumbling, CRNA Pre-anesthesia Checklist: Patient identified, Emergency Drugs available, Suction available and Patient being monitored Patient Re-evaluated:Patient Re-evaluated prior to induction Oxygen Delivery Method: Circle system utilized Preoxygenation: Pre-oxygenation with 100% oxygen Induction Type: IV induction Ventilation: Mask ventilation without difficulty Laryngoscope Size: Mac and 3 Grade View: Grade I Tube type: Oral Tube size: 7.5 mm Number of attempts: 1 Airway Equipment and Method: Stylet Placement Confirmation: ETT inserted through vocal cords under direct vision,  positive ETCO2 and breath sounds checked- equal and bilateral Secured at: 22 cm Tube secured with: Tape Dental Injury: Teeth and Oropharynx as per pre-operative assessment

## 2019-05-22 NOTE — Transfer of Care (Signed)
Immediate Anesthesia Transfer of Care Note  Patient: Loretta Nelson  Procedure(s) Performed: INSERTION PORT-A-CATH WITH ULTRASOUND GUIDANCE (Right Chest)  Patient Location: PACU  Anesthesia Type:General  Level of Consciousness: sedated  Airway & Oxygen Therapy: Patient Spontanous Breathing and Patient connected to face mask oxygen  Post-op Assessment: Report given to RN and Post -op Vital signs reviewed and stable  Post vital signs: Reviewed and stable  Last Vitals:  Vitals Value Taken Time  BP 144/74 05/22/19 0948  Temp    Pulse 74 05/22/19 0949  Resp 19 05/22/19 0949  SpO2 100 % 05/22/19 0949  Vitals shown include unvalidated device data.  Last Pain:  Vitals:   05/22/19 0717  TempSrc: Oral         Complications: No apparent anesthesia complications

## 2019-05-22 NOTE — Anesthesia Preprocedure Evaluation (Addendum)
Anesthesia Evaluation  Patient identified by MRN, date of birth, ID band Patient awake    Reviewed: Allergy & Precautions, NPO status , Patient's Chart, lab work & pertinent test results  Airway Mallampati: II  TM Distance: >3 FB     Dental   Pulmonary pneumonia,    breath sounds clear to auscultation       Cardiovascular hypertension, + Valvular Problems/Murmurs  Rhythm:Regular Rate:Normal     Neuro/Psych    GI/Hepatic Neg liver ROS, hiatal hernia, GERD  ,  Endo/Other  Hypothyroidism Hyperthyroidism   Renal/GU negative Renal ROS     Musculoskeletal  (+) Arthritis ,   Abdominal   Peds  Hematology  (+) anemia ,   Anesthesia Other Findings   Reproductive/Obstetrics                            Anesthesia Physical Anesthesia Plan  ASA: III  Anesthesia Plan: General   Post-op Pain Management:    Induction: Intravenous  PONV Risk Score and Plan: 3 and Ondansetron, Dexamethasone and Midazolam  Airway Management Planned: Oral ETT  Additional Equipment:   Intra-op Plan:   Post-operative Plan: Extubation in OR  Informed Consent: I have reviewed the patients History and Physical, chart, labs and discussed the procedure including the risks, benefits and alternatives for the proposed anesthesia with the patient or authorized representative who has indicated his/her understanding and acceptance.     Dental advisory given  Plan Discussed with: CRNA and Anesthesiologist  Anesthesia Plan Comments:        Anesthesia Quick Evaluation

## 2019-05-22 NOTE — Interval H&P Note (Signed)
History and Physical Interval Note:  05/22/2019 7:28 AM  Loretta Nelson  has presented today for surgery, with the diagnosis of LEFT INVASIVE DUCTAL CARCINOMA.  The various methods of treatment have been discussed with the patient and family. After consideration of risks, benefits and other options for treatment, the patient has consented to  Procedure(s) with comments: [place] (N/A) - LMA as a surgical intervention.  The patient's history has been reviewed, patient examined, no change in status, stable for surgery.  I have reviewed the patient's chart and labs.  Questions were answered to the patient's satisfaction.     Maia Petties

## 2019-05-22 NOTE — Anesthesia Postprocedure Evaluation (Signed)
Anesthesia Post Note  Patient: Loretta Nelson  Procedure(s) Performed: INSERTION PORT-A-CATH WITH ULTRASOUND GUIDANCE (Right Chest)     Patient location during evaluation: PACU Anesthesia Type: General Level of consciousness: awake Pain management: pain level controlled Vital Signs Assessment: post-procedure vital signs reviewed and stable Respiratory status: spontaneous breathing Cardiovascular status: stable Postop Assessment: no apparent nausea or vomiting Anesthetic complications: no    Last Vitals:  Vitals:   05/22/19 1108 05/22/19 1130  BP: 139/69 139/74  Pulse:  77  Resp: 20   Temp: 36.8 C   SpO2: 93% 92%    Last Pain:  Vitals:   05/22/19 1115  TempSrc:   PainSc: 3                  Aleiah Mohammed

## 2019-05-22 NOTE — Progress Notes (Signed)
Pt discharged in NAD, VSS, pain tolerable. Pt given discharge instructions. Pt discharged home with daughter.

## 2019-05-23 ENCOUNTER — Inpatient Hospital Stay: Payer: BC Managed Care – PPO

## 2019-05-23 ENCOUNTER — Encounter: Payer: Self-pay | Admitting: *Deleted

## 2019-05-23 ENCOUNTER — Other Ambulatory Visit: Payer: Self-pay

## 2019-05-23 VITALS — BP 158/80 | HR 65 | Temp 98.3°F | Resp 18

## 2019-05-23 DIAGNOSIS — Z1501 Genetic susceptibility to malignant neoplasm of breast: Secondary | ICD-10-CM

## 2019-05-23 DIAGNOSIS — Z90710 Acquired absence of both cervix and uterus: Secondary | ICD-10-CM

## 2019-05-23 DIAGNOSIS — C50412 Malignant neoplasm of upper-outer quadrant of left female breast: Secondary | ICD-10-CM

## 2019-05-23 DIAGNOSIS — Z170 Estrogen receptor positive status [ER+]: Secondary | ICD-10-CM

## 2019-05-23 DIAGNOSIS — Z95828 Presence of other vascular implants and grafts: Secondary | ICD-10-CM

## 2019-05-23 LAB — CBC WITH DIFFERENTIAL/PLATELET
Abs Immature Granulocytes: 0.12 10*3/uL — ABNORMAL HIGH (ref 0.00–0.07)
Basophils Absolute: 0 10*3/uL (ref 0.0–0.1)
Basophils Relative: 0 %
Eosinophils Absolute: 0 10*3/uL (ref 0.0–0.5)
Eosinophils Relative: 0 %
HCT: 34.5 % — ABNORMAL LOW (ref 36.0–46.0)
Hemoglobin: 11.1 g/dL — ABNORMAL LOW (ref 12.0–15.0)
Immature Granulocytes: 1 %
Lymphocytes Relative: 12 %
Lymphs Abs: 1.2 10*3/uL (ref 0.7–4.0)
MCH: 26.8 pg (ref 26.0–34.0)
MCHC: 32.2 g/dL (ref 30.0–36.0)
MCV: 83.3 fL (ref 80.0–100.0)
Monocytes Absolute: 0.3 10*3/uL (ref 0.1–1.0)
Monocytes Relative: 3 %
Neutro Abs: 8.3 10*3/uL — ABNORMAL HIGH (ref 1.7–7.7)
Neutrophils Relative %: 84 %
Platelets: 203 10*3/uL (ref 150–400)
RBC: 4.14 MIL/uL (ref 3.87–5.11)
RDW: 13.2 % (ref 11.5–15.5)
WBC: 9.9 10*3/uL (ref 4.0–10.5)
nRBC: 0 % (ref 0.0–0.2)

## 2019-05-23 LAB — COMPREHENSIVE METABOLIC PANEL
ALT: 21 U/L (ref 0–44)
AST: 16 U/L (ref 15–41)
Albumin: 3.6 g/dL (ref 3.5–5.0)
Alkaline Phosphatase: 66 U/L (ref 38–126)
Anion gap: 7 (ref 5–15)
BUN: 16 mg/dL (ref 6–20)
CO2: 30 mmol/L (ref 22–32)
Calcium: 9.5 mg/dL (ref 8.9–10.3)
Chloride: 103 mmol/L (ref 98–111)
Creatinine, Ser: 0.8 mg/dL (ref 0.44–1.00)
GFR calc Af Amer: >60 mL/min (ref >60)
GFR calc non Af Amer: >60 mL/min (ref >60)
Glucose, Bld: 141 mg/dL — ABNORMAL HIGH (ref 70–99)
Potassium: 3.3 mmol/L — ABNORMAL LOW (ref 3.5–5.1)
Sodium: 140 mmol/L (ref 135–145)
Total Bilirubin: 0.3 mg/dL (ref 0.3–1.2)
Total Protein: 7.1 g/dL (ref 6.5–8.1)

## 2019-05-23 MED ORDER — DEXAMETHASONE SODIUM PHOSPHATE 10 MG/ML IJ SOLN: 20.0000 mg | Freq: Once | INTRAMUSCULAR | Status: DC

## 2019-05-23 MED ORDER — HEPARIN SOD (PORK) LOCK FLUSH 100 UNIT/ML IV SOLN
500.0000 [IU] | Freq: Once | INTRAVENOUS | Status: AC
  Administered 2019-05-23: 500 [IU] via INTRAVENOUS
  Filled 2019-05-23: qty 5

## 2019-05-23 MED ORDER — HEPARIN SOD (PORK) LOCK FLUSH 100 UNIT/ML IV SOLN
500.0000 [IU] | Freq: Once | INTRAVENOUS | Status: DC | PRN
  Filled 2019-05-23: qty 5

## 2019-05-23 MED ORDER — SODIUM CHLORIDE 0.9% FLUSH
10.0000 mL | INTRAVENOUS | Status: DC | PRN
  Administered 2019-05-23 (×2): 10 mL via INTRAVENOUS
  Filled 2019-05-23: qty 10

## 2019-05-23 MED ORDER — SODIUM CHLORIDE 0.9 % IV SOLN
600.0000 mg/m2 | Freq: Once | INTRAVENOUS | Status: AC
  Administered 2019-05-23: 1500 mg via INTRAVENOUS
  Filled 2019-05-23: qty 75

## 2019-05-23 MED ORDER — SODIUM CHLORIDE 0.9 % IV SOLN
75.0000 mg/m2 | Freq: Once | INTRAVENOUS | Status: AC
  Administered 2019-05-23: 190 mg via INTRAVENOUS
  Filled 2019-05-23: qty 19

## 2019-05-23 MED ORDER — SODIUM CHLORIDE 0.9% FLUSH
10.0000 mL | INTRAVENOUS | Status: DC | PRN
  Filled 2019-05-23: qty 10

## 2019-05-23 MED ORDER — DEXAMETHASONE SODIUM PHOSPHATE 10 MG/ML IJ SOLN
INTRAMUSCULAR | Status: AC
  Filled 2019-05-23: qty 2

## 2019-05-23 MED ORDER — DEXAMETHASONE SODIUM PHOSPHATE 10 MG/ML IJ SOLN
10.0000 mg | Freq: Once | INTRAMUSCULAR | Status: AC
  Administered 2019-05-23: 10 mg via INTRAVENOUS

## 2019-05-23 MED ORDER — SODIUM CHLORIDE 0.9 % IV SOLN
Freq: Once | INTRAVENOUS | Status: AC
  Filled 2019-05-23: qty 250

## 2019-05-23 MED ORDER — PALONOSETRON HCL INJECTION 0.25 MG/5ML
INTRAVENOUS | Status: AC
  Filled 2019-05-23: qty 5

## 2019-05-23 MED ORDER — PALONOSETRON HCL INJECTION 0.25 MG/5ML
0.2500 mg | Freq: Once | INTRAVENOUS | Status: AC
  Administered 2019-05-23: 0.25 mg via INTRAVENOUS

## 2019-05-23 NOTE — Patient Instructions (Addendum)
[facility] Discharge Instructions for Patients Receiving Chemotherapy  Today you received the following chemotherapy agents Taxotere & Cytoxan  To help prevent nausea and vomiting after your treatment, we encourage you to take your nausea medication as directed.   If you develop nausea and vomiting that is not controlled by your nausea medication, call the clinic.   BELOW ARE SYMPTOMS THAT SHOULD BE REPORTED IMMEDIATELY:  *FEVER GREATER THAN 100.5 F  *CHILLS WITH OR WITHOUT FEVER  NAUSEA AND VOMITING THAT IS NOT CONTROLLED WITH YOUR NAUSEA MEDICATION  *UNUSUAL SHORTNESS OF BREATH  *UNUSUAL BRUISING OR BLEEDING  TENDERNESS IN MOUTH AND THROAT WITH OR WITHOUT PRESENCE OF ULCERS  *URINARY PROBLEMS  *BOWEL PROBLEMS  UNUSUAL RASH Items with * indicate a potential emergency and should be followed up as soon as possible.  Feel free to call the clinic should you have any questions or concerns. The clinic phone number is [phone].  Please show the CHEMO ALERT CARD at check-in to the Emergency Department and triage nurse.  Docetaxel injection What is this medicine? DOCETAXEL (doe se TAX el) is a chemotherapy drug. It targets fast dividing cells, like cancer cells, and causes these cells to die. This medicine is used to treat many types of cancers like breast cancer, certain stomach cancers, head and neck cancer, lung cancer, and prostate cancer. This medicine may be used for other purposes; ask your health care provider or pharmacist if you have questions. COMMON BRAND NAME(S): Docefrez, Taxotere What should I tell my health care provider before I take this medicine? They need to know if you have any of these conditions:  infection (especially a virus infection such as chickenpox, cold sores, or herpes)  liver disease  low blood counts, like low white cell, platelet, or red cell counts  an unusual or allergic reaction to docetaxel, polysorbate 80, other  chemotherapy agents, other medicines, foods, dyes, or preservatives  pregnant or trying to get pregnant  breast-feeding How should I use this medicine? This drug is given as an infusion into a vein. It is administered in a hospital or clinic by a specially trained health care professional. Talk to your pediatrician regarding the use of this medicine in children. Special care may be needed. Overdosage: If you think you have taken too much of this medicine contact a poison control center or emergency room at once. NOTE: This medicine is only for you. Do not share this medicine with others. What if I miss a dose? It is important not to miss your dose. Call your doctor or health care professional if you are unable to keep an appointment. What may interact with this medicine?  aprepitant  certain antibiotics like erythromycin or clarithromycin  certain antivirals for HIV or hepatitis  certain medicines for fungal infections like fluconazole, itraconazole, ketoconazole, posaconazole, or voriconazole  cimetidine  ciprofloxacin  conivaptan  cyclosporine  dronedarone  fluvoxamine  grapefruit juice  imatinib  verapamil This list may not describe all possible interactions. Give your health care provider a list of all the medicines, herbs, non-prescription drugs, or dietary supplements you use. Also tell them if you smoke, drink alcohol, or use illegal drugs. Some items may interact with your medicine. What should I watch for while using this medicine? Your condition will be monitored carefully while you are receiving this medicine. You will need important blood work done while you are taking this medicine. Call your doctor or health care professional for advice if you get a fever,   chills or sore throat, or other symptoms of a cold or flu. Do not treat yourself. This drug decreases your body's ability to fight infections. Try to avoid being around people who are sick. Some products may  contain alcohol. Ask your health care professional if this medicine contains alcohol. Be sure to tell all health care professionals you are taking this medicine. Certain medicines, like metronidazole and disulfiram, can cause an unpleasant reaction when taken with alcohol. The reaction includes flushing, headache, nausea, vomiting, sweating, and increased thirst. The reaction can last from 30 minutes to several hours. You may get drowsy or dizzy. Do not drive, use machinery, or do anything that needs mental alertness until you know how this medicine affects you. Do not stand or sit up quickly, especially if you are an older patient. This reduces the risk of dizzy or fainting spells. Alcohol may interfere with the effect of this medicine. Talk to your health care professional about your risk of cancer. You may be more at risk for certain types of cancer if you take this medicine. Do not become pregnant while taking this medicine or for 6 months after stopping it. Women should inform their doctor if they wish to become pregnant or think they might be pregnant. There is a potential for serious side effects to an unborn child. Talk to your health care professional or pharmacist for more information. Do not breast-feed an infant while taking this medicine or for 1 week after stopping it. Males who get this medicine must use a condom during sex with females who can get pregnant. If you get a woman pregnant, the baby could have birth defects. The baby could die before they are born. You will need to continue wearing a condom for 3 months after stopping the medicine. Tell your health care provider right away if your partner becomes pregnant while you are taking this medicine. This may interfere with the ability to father a child. You should talk to your doctor or health care professional if you are concerned about your fertility. What side effects may I notice from receiving this medicine? Side effects that you  should report to your doctor or health care professional as soon as possible:  allergic reactions like skin rash, itching or hives, swelling of the face, lips, or tongue  blurred vision  breathing problems  changes in vision  low blood counts - This drug may decrease the number of white blood cells, red blood cells and platelets. You may be at increased risk for infections and bleeding.  nausea and vomiting  pain, redness or irritation at site where injected  pain, tingling, numbness in the hands or feet  redness, blistering, peeling, or loosening of the skin, including inside the mouth  signs of decreased platelets or bleeding - bruising, pinpoint red spots on the skin, black, tarry stools, nosebleeds  signs of decreased red blood cells - unusually weak or tired, fainting spells, lightheadedness  signs of infection - fever or chills, cough, sore throat, pain or difficulty passing urine  swelling of the ankle, feet, hands Side effects that usually do not require medical attention (report to your doctor or health care professional if they continue or are bothersome):  constipation  diarrhea  fingernail or toenail changes  hair loss  loss of appetite  mouth sores  muscle pain This list may not describe all possible side effects. Call your doctor for medical advice about side effects. You may report side effects to FDA at 1-800-FDA-1088.   Where should I keep my medicine? This drug is given in a hospital or clinic and will not be stored at home. NOTE: This sheet is a summary. It may not cover all possible information. If you have questions about this medicine, talk to your doctor, pharmacist, or health care provider.  2020 Elsevier/Gold Standard (2018-10-11 10:19:06)  Cyclophosphamide Injection What is this medicine? CYCLOPHOSPHAMIDE (sye kloe FOSS fa mide) is a chemotherapy drug. It slows the growth of cancer cells. This medicine is used to treat many types of cancer  like lymphoma, myeloma, leukemia, breast cancer, and ovarian cancer, to name a few. This medicine may be used for other purposes; ask your health care provider or pharmacist if you have questions. COMMON BRAND NAME(S): Cytoxan, Neosar What should I tell my health care provider before I take this medicine? They need to know if you have any of these conditions:  heart disease  history of irregular heartbeat  infection  kidney disease  liver disease  low blood counts, like white cells, platelets, or red blood cells  on hemodialysis  recent or ongoing radiation therapy  scarring or thickening of the lungs  trouble passing urine  an unusual or allergic reaction to cyclophosphamide, other medicines, foods, dyes, or preservatives  pregnant or trying to get pregnant  breast-feeding How should I use this medicine? This drug is usually given as an injection into a vein or muscle or by infusion into a vein. It is administered in a hospital or clinic by a specially trained health care professional. Talk to your pediatrician regarding the use of this medicine in children. Special care may be needed. Overdosage: If you think you have taken too much of this medicine contact a poison control center or emergency room at once. NOTE: This medicine is only for you. Do not share this medicine with others. What if I miss a dose? It is important not to miss your dose. Call your doctor or health care professional if you are unable to keep an appointment. What may interact with this medicine?  amphotericin B  azathioprine  certain antivirals for HIV or hepatitis  certain medicines for blood pressure, heart disease, irregular heart beat  certain medicines that treat or prevent blood clots like warfarin  certain other medicines for cancer  cyclosporine  etanercept  indomethacin  medicines that relax muscles for surgery  medicines to increase blood counts  metronidazole This list  may not describe all possible interactions. Give your health care provider a list of all the medicines, herbs, non-prescription drugs, or dietary supplements you use. Also tell them if you smoke, drink alcohol, or use illegal drugs. Some items may interact with your medicine. What should I watch for while using this medicine? Your condition will be monitored carefully while you are receiving this medicine. You may need blood work done while you are taking this medicine. Drink water or other fluids as directed. Urinate often, even at night. Some products may contain alcohol. Ask your health care professional if this medicine contains alcohol. Be sure to tell all health care professionals you are taking this medicine. Certain medicines, like metronidazole and disulfiram, can cause an unpleasant reaction when taken with alcohol. The reaction includes flushing, headache, nausea, vomiting, sweating, and increased thirst. The reaction can last from 30 minutes to several hours. Do not become pregnant while taking this medicine or for 1 year after stopping it. Women should inform their health care professional if they wish to become pregnant or think they   might be pregnant. Men should not father a child while taking this medicine and for 4 months after stopping it. There is potential for serious side effects to an unborn child. Talk to your health care professional for more information. Do not breast-feed an infant while taking this medicine or for 1 week after stopping it. This medicine has caused ovarian failure in some women. This medicine may make it more difficult to get pregnant. Talk to your health care professional if you are concerned about your fertility. This medicine has caused decreased sperm counts in some men. This may make it more difficult to father a child. Talk to your health care professional if you are concerned about your fertility. Call your health care professional for advice if you get a  fever, chills, or sore throat, or other symptoms of a cold or flu. Do not treat yourself. This medicine decreases your body's ability to fight infections. Try to avoid being around people who are sick. Avoid taking medicines that contain aspirin, acetaminophen, ibuprofen, naproxen, or ketoprofen unless instructed by your health care professional. These medicines may hide a fever. Talk to your health care professional about your risk of cancer. You may be more at risk for certain types of cancer if you take this medicine. If you are going to need surgery or other procedure, tell your health care professional that you are using this medicine. Be careful brushing or flossing your teeth or using a toothpick because you may get an infection or bleed more easily. If you have any dental work done, tell your dentist you are receiving this medicine. What side effects may I notice from receiving this medicine? Side effects that you should report to your doctor or health care professional as soon as possible:  allergic reactions like skin rash, itching or hives, swelling of the face, lips, or tongue  breathing problems  nausea, vomiting  signs and symptoms of bleeding such as bloody or black, tarry stools; red or dark brown urine; spitting up blood or brown material that looks like coffee grounds; red spots on the skin; unusual bruising or bleeding from the eyes, gums, or nose  signs and symptoms of heart failure like fast, irregular heartbeat, sudden weight gain; swelling of the ankles, feet, hands  signs and symptoms of infection like fever; chills; cough; sore throat; pain or trouble passing urine  signs and symptoms of kidney injury like trouble passing urine or change in the amount of urine  signs and symptoms of liver injury like dark yellow or brown urine; general ill feeling or flu-like symptoms; light-colored stools; loss of appetite; nausea; right upper belly pain; unusually weak or tired;  yellowing of the eyes or skin Side effects that usually do not require medical attention (report to your doctor or health care professional if they continue or are bothersome):  confusion  decreased hearing  diarrhea  facial flushing  hair loss  headache  loss of appetite  missed menstrual periods  signs and symptoms of low red blood cells or anemia such as unusually weak or tired; feeling faint or lightheaded; falls  skin discoloration This list may not describe all possible side effects. Call your doctor for medical advice about side effects. You may report side effects to FDA at 1-800-FDA-1088. Where should I keep my medicine? This drug is given in a hospital or clinic and will not be stored at home. NOTE: This sheet is a summary. It may not cover all possible information. If you have   questions about this medicine, talk to your doctor, pharmacist, or health care provider.  2020 Elsevier/Gold Standard (2018-11-19 09:53:29)     

## 2019-05-25 ENCOUNTER — Other Ambulatory Visit: Payer: Self-pay

## 2019-05-25 ENCOUNTER — Inpatient Hospital Stay: Payer: BC Managed Care – PPO

## 2019-05-25 VITALS — BP 154/82 | HR 60 | Temp 98.5°F | Resp 17

## 2019-05-25 DIAGNOSIS — C50412 Malignant neoplasm of upper-outer quadrant of left female breast: Secondary | ICD-10-CM | POA: Diagnosis not present

## 2019-05-25 MED ORDER — PEGFILGRASTIM-JMDB 6 MG/0.6ML ~~LOC~~ SOSY
6.0000 mg | PREFILLED_SYRINGE | Freq: Once | SUBCUTANEOUS | Status: AC
  Administered 2019-05-25: 12:00:00 6 mg via SUBCUTANEOUS

## 2019-05-25 MED ORDER — PEGFILGRASTIM-JMDB 6 MG/0.6ML ~~LOC~~ SOSY
PREFILLED_SYRINGE | SUBCUTANEOUS | Status: AC
  Filled 2019-05-25: qty 0.6

## 2019-05-25 NOTE — Patient Instructions (Signed)

## 2019-05-27 ENCOUNTER — Encounter: Payer: Self-pay | Admitting: Neurology

## 2019-05-28 ENCOUNTER — Telehealth: Payer: Self-pay | Admitting: Neurology

## 2019-05-28 ENCOUNTER — Encounter: Payer: Self-pay | Admitting: Neurology

## 2019-05-28 DIAGNOSIS — Z9989 Dependence on other enabling machines and devices: Secondary | ICD-10-CM

## 2019-05-28 DIAGNOSIS — G4733 Obstructive sleep apnea (adult) (pediatric): Secondary | ICD-10-CM

## 2019-05-28 NOTE — Telephone Encounter (Signed)
I responded via mychart. Order for pressure change sent to [facility] via community message. Confirmation received that the order transmitted was successful.

## 2019-05-28 NOTE — Progress Notes (Signed)
[place]  [phone] [phone]     ID: Johnette Teigen Harden DOB: 04/19/1965  MR#: [id number]  HTD#:[id number]  Patient Care Team: [name], MD as PCP - General (Family Medicine) Mauro Kaufmann, RN as Oncology Nurse Navigator Rockwell Germany, RN as Oncology Nurse Navigator Donnie Mesa, MD as Consulting Physician (General Surgery) Magrinat, Virgie Dad, MD as Consulting Physician (Oncology) Kyung Rudd, MD as Consulting Physician (Radiation Oncology) Delrae Rend, MD as Consulting Physician (Endocrinology) Star Age, MD as Consulting Physician (Neurology) Garvin Fila, MD as Consulting Physician (Neurology) Christophe Louis, MD as Consulting Physician (Obstetrics and Gynecology) Chauncey Cruel, MD OTHER MD:  CHIEF COMPLAINT: triple negative breast cancer  CURRENT TREATMENT: Adjuvant chemotherapy   INTERVAL HISTORY: Mariya returns today for follow up of her triple negative breast cancer accompanied by her daughter [name].   Since her last visit, she underwent port placement on 05/22/2019 under Dr. Georgette Dover.  It still bothers her a bit and she still had the Steri-Strips on today, but it worked well for her chemo.  She began her adjuvant chemotherapy consisting of cyclophosphamide and docetaxel on 05/23/2019. The plan is for her to receive this every 21 days for 4 cycles.  Today is cycle 1 day 7.   REVIEW OF SYSTEMS: Dia did well for the first 2 days with minimal nausea.  She did feel tired.  She had some altered taste by day 2.  She had some constipation which she treated by drinking lots of fluids and eating fruits.  She also has MiraLAX on hand. After day 3 with the injection she had lots of bony pains which she treated with Claritin and Tylenol.  She was very tired subsequent days.  Her mouth is sore although she does not have actual mouth sores.  She sleeps poorly and did not like the way Ativan made her feel.  She never vomited.   Aside from these issues a detailed review of systems today was stable   HISTORY OF CURRENT ILLNESS: From the original intake note:  Ottis Sarnowski had routine screening mammography on 03/19/2019 showing a possible abnormality in the left breast. She underwent left diagnostic mammography with tomography and left breast ultrasonography at [facility] on 03/29/2019 showing: breast density category B; 8 mm mass in left breast at 12 o'clock; left axilla negative for lymphadenopathy.  Accordingly on 04/03/2019 she proceeded to biopsy of the left breast area in question. The pathology from this procedure ([id number]) showed: invasive mammary carcinoma, grade 3, e-cadherin positive. Prognostic indicators significant for: estrogen receptor, 10% positive with weak staining intensity and progesterone receptor, 0% negative. Proliferation marker Ki67 at 30%. HER2 negative by immunohistochemistry (1+).  The patient's subsequent history is as detailed below.   PAST MEDICAL HISTORY: Past Medical History:  Diagnosis Date  . Allergic rhinitis   . Anemia    low iron  . Anemia   . Anxiety   . Anxiety   . Arthritis    right knee, surgery done  . Cancer ([place]) 2021   left breast   . Complication of anesthesia    woke up during EGD  . Esophageal stricture   . Family history of breast cancer   . GERD (gastroesophageal reflux disease)   . Graves disease 01/13/11   radioactive iodine treatment, 74.1 millicuries  . Headache    tension headaches  . Hiatal hernia    small  . Hypertension   . Hyperthyroidism   . Hypothyroidism  s/p treatment  . Murmur    benign, h/o, caused by hyperdynamic contraction  . Obesity   . Pneumonia 2017   inhaler prescribed  . Sleep apnea 2021   no CPAP as yet, last study done lastnight 04/15/2019  . Tendonitis of shoulder, right   . Vitamin D deficiency   . Vitamin D deficiency     PAST SURGICAL HISTORY: Past Surgical History:  Procedure Laterality Date  .  BALLOON DILATION N/A 09/07/2016   Procedure: BALLOON DILATION;  Surgeon: Arta Silence, MD;  Location: [facility] ENDOSCOPY;  Service: Endoscopy;  Laterality: N/A;  . BREAST LUMPECTOMY WITH RADIOACTIVE SEED AND SENTINEL LYMPH NODE BIOPSY Left 04/17/2019   Procedure: LEFT BREAST LUMPECTOMY WITH RADIOACTIVE SEED AND SENTINEL LYMPH NODE BIOPSY;  Surgeon: Donnie Mesa, MD;  Location: [facility];  Service: General;  Laterality: Left;  LMA, PEC BLOCK  . CESAREAN SECTION  1999  . COLONOSCOPY    . ESOPHAGOGASTRODUODENOSCOPY    . ESOPHAGOGASTRODUODENOSCOPY (EGD) WITH PROPOFOL N/A 09/07/2016   Procedure: ESOPHAGOGASTRODUODENOSCOPY (EGD) WITH PROPOFOL;  Surgeon: Arta Silence, MD;  Location: [facility];  Service: Endoscopy;  Laterality: N/A;  . HYSTERECTOMY ABDOMINAL WITH SALPINGECTOMY Bilateral 02/17/2017   Procedure: HYSTERECTOMY ABDOMINAL WITH SALPINGECTOMY;  Surgeon: Christophe Louis, MD;  Location: [facility];  Service: Gynecology;  Laterality: Bilateral;  . KNEE ARTHROSCOPY     for torn meniscus  . PORTACATH PLACEMENT Right 05/22/2019   Procedure: INSERTION PORT-A-CATH WITH ULTRASOUND GUIDANCE;  Surgeon: Donnie Mesa, MD;  Location: [facility];  Service: General;  Laterality: Right;  Endotrachial tube  . WISDOM TOOTH EXTRACTION    . WISDOM TOOTH EXTRACTION      FAMILY HISTORY: Family History  Problem Relation Age of Onset  . Hypertension Mother   . CAD Maternal Grandfather   . Breast cancer Paternal Grandmother   . HIV Brother   . Heart attack Brother        sudden MI vs PE  . Breast cancer Sister 21  . Breast cancer Other        one dx 18s, others dx in 71s  . Colon polyps Neg Hx   . Colon cancer Neg Hx   . Liver disease Neg Hx    Patient's father was in his late 47s when he died from causes unknown to the patient.  Patient's mother is 54 years old (as of 04/2019). The patient denies a family hx of ovarian cancer. She reports breast cancer in her sister at age 52, in her paternal grandmother, and in 3  paternal cousins. She had 4 siblings, 2 brothers and 2 sisters, though only her sisters are still living.   GYNECOLOGIC HISTORY:  Patient's last menstrual period was 02/13/2017 (exact date). Menarche: 54 years old Age at first live birth: 54 years old [place] P 2 (twins) LMP 01/2017 Contraceptive: used from [id number] without complication HRT never used  Hysterectomy? Yes, 01/2017, benign pathology BSO? 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 She is divorced. She lives at home with her twin daughters, Maryjo Rochester and Edmon Crape, who are 54 years old fraternal twins and attending college remotely. Maryjo Rochester is studying music and communications at [employer].Edmon Crape is studying IT.    ADVANCED DIRECTIVES: Not in place; the patient intends to name her sister Madelon Lips as her HCPOA [phone])   HEALTH MAINTENANCE: Social History   Tobacco Use  . Smoking status: Never Smoker  .  Smokeless tobacco: Never Used  Substance Use Topics  . Alcohol use: No    Alcohol/week: 0.0 standard drinks  . Drug use: No     Colonoscopy: at age 63  PAP: 02/2018  Bone density: never done   No Known Allergies  Current Outpatient Medications  Medication Sig Dispense Refill  . amLODipine (NORVASC) 5 MG tablet Take 5 mg by mouth daily.   5  . Cholecalciferol 4000 UNITS CAPS Take 4,000 Units by mouth daily.     . citalopram (CELEXA) 40 MG tablet Take 40 mg by mouth daily.    Marland Kitchen dexamethasone (DECADRON) 4 MG tablet Take 2 tablets (8 mg total) by mouth 2 (two) times daily. Start the day before Taxotere. Then again the day after chemo for 3 days. 30 tablet 1  . EDARBYCLOR 40-25 MG TABS Take 1 tablet by mouth daily.  5  . ibuprofen (ADVIL) 200 MG tablet Take 400 mg by mouth every 6 (six) hours as needed for moderate pain.    Marland Kitchen KLOR-CON M20 20 MEQ tablet Take 20 mEq by mouth 2 (two) times daily.   1  .  L-Methylfolate-B12-B6-B2 (CEREFOLIN) 07-29-48-5 MG TABS Take 1 tablet by mouth every morning. 90 tablet 3  . levothyroxine (SYNTHROID) 175 MCG tablet Take 175 mcg by mouth daily before breakfast.   5  . lidocaine-prilocaine (EMLA) cream Apply to affected area once (Patient taking differently: Apply 1 application topically daily as needed (port access). ) 30 g 3  . loratadine (CLARITIN) 10 MG tablet TAKE 1 TABLET BY MOUTH EVERY DAY (Patient taking differently: Take 10 mg by mouth daily. ) 90 tablet 1  . LORazepam (ATIVAN) 0.5 MG tablet Take 1 tablet (0.5 mg total) by mouth every 6 (six) hours as needed (Nausea or vomiting). 30 tablet 0  . MELATONIN PO Take 1 tablet by mouth at bedtime as needed (sleep).    . metoprolol (TOPROL-XL) 200 MG 24 hr tablet Take 200 mg by mouth daily.   5  . omeprazole (PRILOSEC) 40 MG capsule Take 40 mg by mouth See admin instructions. Take 40 mg daily, may take a second 40 mg dose as needed for acid reflux    . prochlorperazine (COMPAZINE) 10 MG tablet Take 1 tablet (10 mg total) by mouth every 6 (six) hours as needed (Nausea or vomiting). 30 tablet 1  . spironolactone (ALDACTONE) 50 MG tablet Take 50 mg by mouth daily.     No current facility-administered medications for this visit.    OBJECTIVE: African-American woman who appears younger than stated age  51:   05/29/19 1444  BP: (!) 141/93  Pulse: 71  Resp: 18  Temp: 98.2 F (36.8 C)  SpO2: 96%     Body mass index is 52.46 kg/m.   Wt Readings from Last 3 Encounters:  05/29/19 (!) 305 lb 9.6 oz (138.6 kg)  05/20/19 (!) 306 lb (138.8 kg)  05/10/19 (!) 306 lb (138.8 kg)      ECOG FS:1 - Symptomatic but completely ambulatory  Sclerae unicteric, EOMs intact Wearing a mask No cervical or supraclavicular adenopathy Lungs no rales or rhonchi Heart regular rate and rhythm Abd soft, nontender, positive bowel sounds MSK no focal spinal tenderness, no upper extremity lymphedema Neuro: nonfocal, well  oriented, appropriate affect Breasts: Deferred   LAB RESULTS:  CMP     Component Value Date/Time   NA 140 05/29/2019 1427   K 4.0 05/29/2019 1427   CL 100 05/29/2019 1427   CO2 31 05/29/2019  1427   GLUCOSE 118 (H) 05/29/2019 1427   BUN 18 05/29/2019 1427   CREATININE 0.87 05/29/2019 1427   CREATININE 0.83 04/10/2019 1240   CALCIUM 8.7 (L) 05/29/2019 1427   PROT 6.5 05/29/2019 1427   ALBUMIN 3.4 (L) 05/29/2019 1427   AST 21 05/29/2019 1427   AST 17 04/10/2019 1240   ALT 30 05/29/2019 1427   ALT 19 04/10/2019 1240   ALKPHOS 79 05/29/2019 1427   BILITOT 0.5 05/29/2019 1427   BILITOT 0.4 04/10/2019 1240   GFRNONAA >60 05/29/2019 1427   GFRNONAA >60 04/10/2019 1240   GFRAA >60 05/29/2019 1427   GFRAA >60 04/10/2019 1240    No results found for: TOTALPROTELP, ALBUMINELP, A1GS, A2GS, BETS, BETA2SER, GAMS, MSPIKE, SPEI  Lab Results  Component Value Date   WBC 2.0 (L) 05/29/2019   NEUTROABS 0.3 (LL) 05/29/2019   HGB 11.6 (L) 05/29/2019   HCT 36.7 05/29/2019   MCV 84.2 05/29/2019   PLT 160 05/29/2019    No results found for: LABCA2  No components found for: MHDQQI297  No results for input(s): INR in the last 168 hours.  No results found for: LABCA2  No results found for: LGX211  No results found for: HER740  No results found for: CXK481  No results found for: CA2729  No components found for: HGQUANT  No results found for: CEA1 / No results found for: CEA1   No results found for: AFPTUMOR  No results found for: CHROMOGRNA  No results found for: KPAFRELGTCHN, LAMBDASER, KAPLAMBRATIO (kappa/lambda light chains)  No results found for: HGBA, HGBA2QUANT, HGBFQUANT, HGBSQUAN (Hemoglobinopathy evaluation)   No results found for: LDH  No results found for: IRON, TIBC, IRONPCTSAT (Iron and TIBC)  No results found for: FERRITIN  Urinalysis No results found for: COLORURINE, APPEARANCEUR, LABSPEC, PHURINE, GLUCOSEU, HGBUR, BILIRUBINUR, KETONESUR, PROTEINUR,  UROBILINOGEN, NITRITE, LEUKOCYTESUR   STUDIES: DG CHEST PORT 1 VIEW  Result Date: 05/22/2019 CLINICAL DATA:  Central line placement EXAM: PORTABLE CHEST 1 VIEW COMPARISON:  Portable exam 1010 hours compared to 03/07/2016 FINDINGS: RIGHT jugular Port-A-Cath with tip projecting over high RIGHT atrium. Enlargement of cardiac silhouette with vascular congestion. Mediastinal contours grossly unremarkable for rotation to the RIGHT. Ice pack projects over RIGHT shoulder. Decreased lung volumes with mild bibasilar atelectasis. No infiltrate, pleural effusion or pneumothorax. IMPRESSION: Bibasilar atelectasis. No pneumothorax following Port-A-Cath placement. Electronically Signed   By: Lavonia Dana M.D.   On: 05/22/2019 10:18   DG C-Arm 1-60 Min-No Report  Result Date: 05/22/2019 Fluoroscopy was utilized by the requesting physician.  No radiographic interpretation.     ELIGIBLE FOR AVAILABLE RESEARCH PROTOCOL:BR003?--Depending on final surgical pathology  ASSESSMENT: 54 y.o. [place] woman status post left breast upper outer quadrant biopsy 04/03/2019 for a clinical T1b N0, stage IB invasive ductal carcinoma, grade 3, functionally triple negative (estrogen receptor is weakly positive at 10%, progesterone receptor negative, HER-2 not amplified), with an MIB-1 of 30%  (1) status post left lumpectomy and sentinel lymph node sampling 04/17/2019 for a pT1c pN0, stage IB invasive ductal carcinoma, grade 3, triple negative, with negative margins  (a) a total of 2 sentinel lymph nodes were removed  (2) adjuvant chemotherapy consisting of cyclophosphamide and docetaxel every 21 days x 4 to start 05/23/2019  (3) adjuvant radiation to follow  (4) genetics testing 04/19/2019 through the [facility] Breast Cancer STAT Panel + Common Hereditary Cancers Panel 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 STK11, TP53, TSC1, TSC2, and VHL.  The following genes were evaluated for sequence changes only: SDHA and HOXB13 c.251G>A variant only.  (b) current data suggests an increase risk of breast and ovarian cancer in patients carrying a BARD1 mutation, but data is preliminary and insufficient for definitive recommendations   PLAN: Jernee did generally quite well with her first cycle of chemotherapy which is always the most difficult 1.  As far as the pain is concerned I suggested that she try Aleve plus Tylenol 3 times a day and use the oxycodone or hydrocodone as needed.  For the constipation she is going to take MiraLAX daily beginning on chemotherapy day and continuing until she is back to regular bowel movements.  Of course she will also hydrate herself and use fruits.  I am going to start her on Cipro for the next 5 days given her very low ANC.  She knows to call us if she develops a fever or any other complication.  I have encouraged her to be as active as she can.  She is planning to continue to work full-time most of the second and third weeks although the first week she will have to take some time off.  Recall she is working virtually at this point.  Her daughter is graduating on May 7.  That will be day 2 of cycle 3.  I think it will be safe for Rhoda to attend and she should be feeling okay at that time since she will be on the dexamethasone.  Otherwise she will return in 2 weeks for her second cycle and in 3 weeks to see me again  Total  encounter time 30 minutes.Chauncey Cruel, MD   05/29/2019 3:13 PM Medical Oncology and Hematology [facility] [place], [place] [address] Tel. [phone]    Fax. [phone]   This document serves as a record of services personally performed by Lurline Del, MD. It was created on his behalf by Wilburn Mylar, a trained medical scribe. The creation of this record is based on the scribe's personal observations and the provider's statements to them.   I, Lurline Del MD, have reviewed the above documentation for accuracy and completeness, and I agree with the above.   *Total Encounter Time as defined by the Centers for Medicare and Medicaid Services includes, in addition to the face-to-face time of a patient visit (documented in the note above) non-face-to-face time: obtaining and reviewing outside history, ordering and reviewing medications, tests or procedures, care coordination (communications with other health care professionals or caregivers) and documentation in the medical record.

## 2019-05-28 NOTE — Telephone Encounter (Signed)
I reviewed patient's CPAP compliance data for the past 20 days, average usage of 5 hours and 46 minutes for days on treatment, average AHI 1.5/h, leak acceptable with a 95th percentile at 15.2 L/min on a pressure of 14 cm with EPR of 3.  Please call patient, and advise her that I have reviewed her MyChart message.  For better tolerance, we can reduce her CPAP pressure to 12 cm down from 14 cm if she is agreeable.  Please send order, which I placed in the chart, to her DME.  Please encourage patient to continue with her CPAP, her sleep apnea is under good control currently on the pressure of 14 cm and we will review how she does on 12 cm in the near future.

## 2019-05-29 ENCOUNTER — Encounter: Payer: Self-pay | Admitting: *Deleted

## 2019-05-29 ENCOUNTER — Inpatient Hospital Stay: Payer: BC Managed Care – PPO

## 2019-05-29 ENCOUNTER — Inpatient Hospital Stay (HOSPITAL_BASED_OUTPATIENT_CLINIC_OR_DEPARTMENT_OTHER): Payer: BC Managed Care – PPO | Admitting: Oncology

## 2019-05-29 ENCOUNTER — Other Ambulatory Visit: Payer: Self-pay

## 2019-05-29 VITALS — BP 141/93 | HR 71 | Temp 98.2°F | Resp 18 | Ht 64.0 in | Wt 305.6 lb

## 2019-05-29 DIAGNOSIS — C50412 Malignant neoplasm of upper-outer quadrant of left female breast: Secondary | ICD-10-CM | POA: Diagnosis not present

## 2019-05-29 DIAGNOSIS — Z1501 Genetic susceptibility to malignant neoplasm of breast: Secondary | ICD-10-CM

## 2019-05-29 DIAGNOSIS — Z90710 Acquired absence of both cervix and uterus: Secondary | ICD-10-CM

## 2019-05-29 DIAGNOSIS — Z170 Estrogen receptor positive status [ER+]: Secondary | ICD-10-CM

## 2019-05-29 LAB — CBC WITH DIFFERENTIAL/PLATELET
Abs Immature Granulocytes: 0.09 10*3/uL — ABNORMAL HIGH (ref 0.00–0.07)
Basophils Absolute: 0 10*3/uL (ref 0.0–0.1)
Basophils Relative: 1 %
Eosinophils Absolute: 0.1 10*3/uL (ref 0.0–0.5)
Eosinophils Relative: 3 %
HCT: 36.7 % (ref 36.0–46.0)
Hemoglobin: 11.6 g/dL — ABNORMAL LOW (ref 12.0–15.0)
Immature Granulocytes: 4 %
Lymphocytes Relative: 64 %
Lymphs Abs: 1.3 10*3/uL (ref 0.7–4.0)
MCH: 26.6 pg (ref 26.0–34.0)
MCHC: 31.6 g/dL (ref 30.0–36.0)
MCV: 84.2 fL (ref 80.0–100.0)
Monocytes Absolute: 0.3 10*3/uL (ref 0.1–1.0)
Monocytes Relative: 15 %
Neutro Abs: 0.3 10*3/uL — CL (ref 1.7–7.7)
Neutrophils Relative %: 13 %
Platelets: 160 10*3/uL (ref 150–400)
RBC: 4.36 MIL/uL (ref 3.87–5.11)
RDW: 13.1 % (ref 11.5–15.5)
WBC: 2 10*3/uL — ABNORMAL LOW (ref 4.0–10.5)
nRBC: 1 % — ABNORMAL HIGH (ref 0.0–0.2)

## 2019-05-29 LAB — COMPREHENSIVE METABOLIC PANEL
ALT: 30 U/L (ref 0–44)
AST: 21 U/L (ref 15–41)
Albumin: 3.4 g/dL — ABNORMAL LOW (ref 3.5–5.0)
Alkaline Phosphatase: 79 U/L (ref 38–126)
Anion gap: 9 (ref 5–15)
BUN: 18 mg/dL (ref 6–20)
CO2: 31 mmol/L (ref 22–32)
Calcium: 8.7 mg/dL — ABNORMAL LOW (ref 8.9–10.3)
Chloride: 100 mmol/L (ref 98–111)
Creatinine, Ser: 0.87 mg/dL (ref 0.44–1.00)
GFR calc Af Amer: >60 mL/min (ref >60)
GFR calc non Af Amer: >60 mL/min (ref >60)
Glucose, Bld: 118 mg/dL — ABNORMAL HIGH (ref 70–99)
Potassium: 4 mmol/L (ref 3.5–5.1)
Sodium: 140 mmol/L (ref 135–145)
Total Bilirubin: 0.5 mg/dL (ref 0.3–1.2)
Total Protein: 6.5 g/dL (ref 6.5–8.1)

## 2019-05-29 MED ORDER — CIPROFLOXACIN HCL 500 MG PO TABS: 500.0000 mg | ORAL_TABLET | Freq: Two times a day (BID) | ORAL | 0 refills | Status: DC

## 2019-05-30 ENCOUNTER — Telehealth: Payer: Self-pay | Admitting: Oncology

## 2019-05-30 ENCOUNTER — Encounter: Payer: BC Managed Care – PPO | Admitting: Psychology

## 2019-05-30 NOTE — Telephone Encounter (Signed)
Scheduled appts per 3/31. Pt confirmed new appt dates and times.

## 2019-05-31 NOTE — Progress Notes (Signed)
I agree with the above plan 

## 2019-06-04 ENCOUNTER — Encounter: Payer: Self-pay | Admitting: Oncology

## 2019-06-06 ENCOUNTER — Encounter: Payer: BC Managed Care – PPO | Admitting: Psychology

## 2019-06-07 NOTE — Progress Notes (Signed)
Pharmacist Chemotherapy Monitoring - Follow Up Assessment    I verify that I have reviewed each item in the below checklist:  . Regimen for the patient is scheduled for the appropriate day and plan matches scheduled date. Marland Kitchen Appropriate non-routine labs are ordered dependent on drug ordered. . If applicable, additional medications reviewed and ordered per protocol based on lifetime cumulative doses and/or treatment regimen.   Plan for follow-up and/or issues identified: No . I-vent associated with next due treatment: No .   Loretta Nelson D 06/07/2019 3:39 PM

## 2019-06-10 ENCOUNTER — Other Ambulatory Visit: Payer: Self-pay | Admitting: Oncology

## 2019-06-10 NOTE — Progress Notes (Addendum)
[place]   [phone] [phone]   Clinic Follow up Note   Patient Care Team: [name], MD as PCP - General (Family Medicine) Mauro Kaufmann, RN as Oncology Nurse Navigator Rockwell Germany, RN as Oncology Nurse Navigator Donnie Mesa, MD as Consulting Physician (General Surgery) Magrinat, Virgie Dad, MD as Consulting Physician (Oncology) Kyung Rudd, MD as Consulting Physician (Radiation Oncology) Delrae Rend, MD as Consulting Physician (Endocrinology) Star Age, MD as Consulting Physician (Neurology) Garvin Fila, MD as Consulting Physician (Neurology) Christophe Louis, MD as Consulting Physician (Obstetrics and Gynecology) 06/13/2019  CHIEF COMPLAINT: F/u triple negative breast cancer   SUMMARY OF ONCOLOGIC HISTORY: Oncology History  Malignant neoplasm of upper-outer quadrant of left breast in female, estrogen receptor positive ([place])  04/05/2019 Initial Diagnosis   Malignant neoplasm of upper-outer quadrant of left breast in female, estrogen receptor positive ([place])   04/17/2019 Cancer Staging   Staging form: Breast, AJCC 8th Edition - Pathologic stage from 04/17/2019: Stage IB (pT1c, pN0, cM0, G3, ER-, PR-, HER2-) - Signed by Gardenia Phlegm, NP on 05/01/2019   04/19/2019 Genetic Testing   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 The report date is 04/19/2019. Remainder of testing was normal.   The STAT Breast cancer panel offered by [facility] includes sequencing and rearrangement analysis for the following 9 genes:  ATM, BRCA1, BRCA2, CDH1, CHEK2, PALB2, PTEN, STK11 and TP53.    The Common Hereditary Cancers Panel offered by [facility] includes sequencing and/or deletion duplication testing of the following 48 genes: APC, ATM, AXIN2, BARD1, BMPR1A, BRCA1, BRCA2, BRIP1, CDH1, CDKN2A (p14ARF), CDKN2A (p16INK4a), CKD4, CHEK2, CTNNA1, DICER1,  EPCAM (Deletion/duplication testing only), GREM1 (promoter region deletion/duplication testing only), KIT, MEN1, MLH1, MSH2, MSH3, MSH6, MUTYH, NBN, NF1, NHTL1, PALB2, PDGFRA, PMS2, POLD1, POLE, PTEN, RAD50, RAD51C, RAD51D, RNF43, SDHB, SDHC, SDHD, SMAD4, SMARCA4. STK11, TP53, TSC1, TSC2, and VHL.  The following genes were evaluated for sequence changes only: SDHA and HOXB13 c.251G>A variant only.   05/23/2019 -  Chemotherapy   The patient had palonosetron (ALOXI) injection 0.25 mg, 0.25 mg, Intravenous,  Once, 2 of 4 cycles Administration: 0.25 mg (05/23/2019) pegfilgrastim (NEULASTA ONPRO KIT) injection 6 mg, 6 mg, Subcutaneous, Once, 1 of 3 cycles pegfilgrastim-jmdb (FULPHILA) injection 6 mg, 6 mg, Subcutaneous,  Once, 2 of 4 cycles Administration: 6 mg (05/25/2019) cyclophosphamide (CYTOXAN) 1,500 mg in sodium chloride 0.9 % 250 mL chemo infusion, 600 mg/m2 = 1,500 mg, Intravenous,  Once, 2 of 4 cycles Administration: 1,500 mg (05/23/2019) DOCEtaxel (TAXOTERE) 190 mg in sodium chloride 0.9 % 500 mL chemo infusion, 75 mg/m2 = 190 mg, Intravenous,  Once, 2 of 4 cycles Administration: 190 mg (05/23/2019) fosaprepitant (EMEND) 150 mg in sodium chloride 0.9 % 145 mL IVPB, 150 mg, Intravenous,  Once, 1 of 3 cycles  for chemotherapy treatment.      CURRENT THERAPY: Adjuvant chemo, TC q3 weeks x4, starting 05/23/19   INTERVAL HISTORY: Ms. Rhyne returns for f/u and treatment as scheduled. She completed cycle 1 TC on 3/25 with Fulphila. She was seen by Dr. Jana Hakim on 3/31 for toxicity check. Her ANC was 0.3 and she was given prophylactic cipro x5 days.   She reports significant fatigue for at least the first week after treatment, able to be out of bed and complete ADLs but with effort. She tried to walk a few times outside but did not get far. She had  nausea without vomiting and decreased po intake for a week. Compazine did not help much and ativan made her feel bad. She had a sore tongue without actual  sores. Had a headache a few times but nothing major. She had constipation until day 4, now regular. Using miralax PRN. She had bone pain mainly in her torso, controlled with tylenol and claritin. She eventually recovered on week 2, energy is adequate, appetite came back stronger. She had bone pain mainly in her torso, controlled with tylenol and claritin. Otherwise, denies fever, chills, cough, chest pain, dyspnea, leg edema, rash, tearing, neuropathy, bleeding, or concerns with her breasts/incisions.    MEDICAL HISTORY:  Past Medical History:  Diagnosis Date  . Allergic rhinitis   . Anemia    low iron  . Anemia   . Anxiety   . Anxiety   . Arthritis    right knee, surgery done  . Cancer ([place]) 2021   left breast   . Complication of anesthesia    woke up during EGD  . Esophageal stricture   . Family history of breast cancer   . GERD (gastroesophageal reflux disease)   . Graves disease 01/13/11   radioactive iodine treatment, 85.2 millicuries  . Headache    tension headaches  . Hiatal hernia    small  . Hypertension   . Hyperthyroidism   . Hypothyroidism    s/p treatment  . Murmur    benign, h/o, caused by hyperdynamic contraction  . Obesity   . Pneumonia 2017   inhaler prescribed  . Sleep apnea 2021   no CPAP as yet, last study done lastnight 04/15/2019  . Tendonitis of shoulder, right   . Vitamin D deficiency   . Vitamin D deficiency     SURGICAL HISTORY: Past Surgical History:  Procedure Laterality Date  . BALLOON DILATION N/A 09/07/2016   Procedure: BALLOON DILATION;  Surgeon: Arta Silence, MD;  Location[facility] END[facility]COPY;  Service: Endoscopy;  Laterality: N/A;  . BREAST LUMPECTOMY WITH RADIOACTIVE SEED AND SENTINEL LYMPH NODE BIOPSY Left 04/17/2019   Procedure: LEFT BREAST LUMPECTOMY WITH RADIOACTIVE SEED AND SENTINEL LYMPH NODE BIOPSY;  Surgeon: Donnie Mesa, MD;  Location: [facility];  Service: General;  Laterality: Left;  LMA, PEC BLOCK  . CESAREAN SECTION  1999  .  COLONOSCOPY    . ESOPHAGOGASTRODUODENOSCOPY    . ESOPHAGOGASTRODUODENOSCOPY (EGD) WITH PROPOFOL N/A 09/07/2016   Procedure: ESOPHAGOGASTRODUODENOSCOPY (EGD) WITH PROPOFOL;  Surgeon: Arta Silence, MD;  Location: [facility];  Service: Endoscopy;  Laterality: N/A;  . HYSTERECTOMY ABDOMINAL WITH SALPINGECTOMY Bilateral 02/17/2017   Procedure: HYSTERECTOMY ABDOMINAL WITH SALPINGECTOMY;  Surgeon: Christophe Louis, MD;  Location: [facility];  Service: Gynecology;  Laterality: Bilateral;  . KNEE ARTHROSCOPY     for torn meniscus  . PORTACATH PLACEMENT Right 05/22/2019   Procedure: INSERTION PORT-A-CATH WITH ULTRASOUND GUIDANCE;  Surgeon: Donnie Mesa, MD;  Location: [facility];  Service: General;  Laterality: Right;  Endotrachial tube  . WISDOM TOOTH EXTRACTION    . WISDOM TOOTH EXTRACTION      I have reviewed the social history and family history with the patient and they are unchanged from previous note.  ALLERGIES:  has No Known Allergies.  MEDICATIONS:  Current Outpatient Medications  Medication Sig Dispense Refill  . amLODipine (NORVASC) 5 MG tablet Take 5 mg by mouth daily.   5  . Cholecalciferol 4000 UNITS CAPS Take 4,000 Units by mouth daily.     . citalopram (CELEXA) 40 MG tablet Take 40 mg  by mouth daily.    Marland Kitchen dexamethasone (DECADRON) 4 MG tablet Take 2 tablets (8 mg total) by mouth 2 (two) times daily. Start the day before Taxotere. Then again the day after chemo for 3 days. 30 tablet 1  . EDARBYCLOR 40-25 MG TABS Take 1 tablet by mouth daily.  5  . ibuprofen (ADVIL) 200 MG tablet Take 400 mg by mouth every 6 (six) hours as needed for moderate pain.    Marland Kitchen KLOR-CON M20 20 MEQ tablet Take 20 mEq by mouth 2 (two) times daily.   1  . L-Methylfolate-B12-B6-B2 (CEREFOLIN) 07-29-48-5 MG TABS Take 1 tablet by mouth every morning. 90 tablet 3  . levothyroxine (SYNTHROID) 175 MCG tablet Take 175 mcg by mouth daily before breakfast.   5  . lidocaine-prilocaine (EMLA) cream Apply to affected area once  (Patient taking differently: Apply 1 application topically daily as needed (port access). ) 30 g 3  . loratadine (CLARITIN) 10 MG tablet TAKE 1 TABLET BY MOUTH EVERY DAY (Patient taking differently: Take 10 mg by mouth daily. ) 90 tablet 1  . metoprolol (TOPROL-XL) 200 MG 24 hr tablet Take 200 mg by mouth daily.   5  . omeprazole (PRILOSEC) 40 MG capsule Take 40 mg by mouth See admin instructions. Take 40 mg daily, may take a second 40 mg dose as needed for acid reflux    . ciprofloxacin (CIPRO) 500 MG tablet Take 1 tablet (500 mg total) by mouth 2 (two) times daily. Take for 5 days starting one week after a chemotherapy dose 40 tablet 0  . LORazepam (ATIVAN) 0.5 MG tablet Take 1 tablet (0.5 mg total) by mouth every 6 (six) hours as needed (Nausea or vomiting). 30 tablet 0  . MELATONIN PO Take 1 tablet by mouth at bedtime as needed (sleep).    . ondansetron (ZOFRAN) 8 MG tablet Take 1 tablet (8 mg total) by mouth every 8 (eight) hours as needed for nausea or vomiting. Start on day 3 after chemotherapy 30 tablet 1  . prochlorperazine (COMPAZINE) 10 MG tablet Take 1 tablet (10 mg total) by mouth every 6 (six) hours as needed (Nausea or vomiting). 30 tablet 1  . spironolactone (ALDACTONE) 50 MG tablet Take 50 mg by mouth daily.     No current facility-administered medications for this visit.   Facility-Administered Medications Ordered in Other Visits  Medication Dose Route Frequency Provider Last Rate Last Admin  . cyclophosphamide (CYTOXAN) 1,500 mg in sodium chloride 0.9 % 250 mL chemo infusion  600 mg/m2 (Treatment Plan Recorded) Intravenous Once Magrinat, Virgie Dad, MD      . dexamethasone (DECADRON) 10 mg in sodium chloride 0.9 % 50 mL IVPB  10 mg Intravenous Once Magrinat, Virgie Dad, MD 204 mL/hr at 06/13/19 0956 10 mg at 06/13/19 0956  . DOCEtaxel (TAXOTERE) 190 mg in sodium chloride 0.9 % 500 mL chemo infusion  75 mg/m2 (Treatment Plan Recorded) Intravenous Once Magrinat, Virgie Dad, MD      .  fosaprepitant (EMEND) 150 mg in sodium chloride 0.9 % 145 mL IVPB  150 mg Intravenous Once Cira Rue K, NP      . heparin lock flush 100 unit/mL  500 Units Intracatheter Once PRN Magrinat, Virgie Dad, MD      . pegfilgrastim (NEULASTA ONPRO KIT) injection 6 mg  6 mg Subcutaneous Once Cira Rue K, NP      . sodium chloride flush (NS) 0.9 % injection 10 mL  10 mL Intracatheter PRN Magrinat, Sarajane Jews  C, MD        PHYSICAL EXAMINATION: ECOG PERFORMANCE STATUS: 1 - Symptomatic but completely ambulatory  Vitals:   06/13/19 0836  BP: (!) 160/85  Pulse: 76  Resp: 20  Temp: 98.7 F (37.1 C)  SpO2: 100%   Filed Weights   06/13/19 0836  Weight: (!) 310 lb 3.2 oz (140.7 kg)    GENERAL:alert, no distress and comfortable SKIN: no rash  EYES: sclera clear OROPHARYNX: no erythema or ulcers NECK: without mass LUNGS: clear with normal breathing effort HEART: regular rate & rhythm, no lower extremity edema NEURO: alert & oriented x 3 with fluent speech, normal gait PAC without erythema  Breast: visual inspection reveals areolar and axillary incisions have completely healed. No erythema or drainage. Complete breast exam deferred   LABORATORY DATA:  I have reviewed the data as listed CBC Latest Ref Rng & Units 06/13/2019 05/29/2019 05/23/2019  WBC 4.0 - 10.5 K/uL 13.8(H) 2.0(L) 9.9  Hemoglobin 12.0 - 15.0 g/dL 11.0(L) 11.6(L) 11.1(L)  Hematocrit 36.0 - 46.0 % 34.4(L) 36.7 34.5(L)  Platelets 150 - 400 K/uL 237 160 203     CMP Latest Ref Rng & Units 06/13/2019 05/29/2019 05/23/2019  Glucose 70 - 99 mg/dL 147(H) 118(H) 141(H)  BUN 6 - 20 mg/dL _0 Creatinine 0.44 - 1.00 mg/dL 0.74 0.87 0.80  Sodium 135 - 145 mmol/L 141 140 140  Potassium 3.5 - 5.1 mmol/L 3.3(L) 4.0 3.3(L)  Chloride 98 - 111 mmol/L 103 100 103  CO2 22 - 32 mmol/L _1 Calcium 8.9 - 10.3 mg/dL 9.9 8.7(L) 9.5  Total Protein 6.5 - 8.1 g/dL 7.3 6.5 7.1  Total Bilirubin 0.3 - 1.2 mg/dL 0.4 0.5 0.3  Alkaline Phos 38  - 126 U/L 69 79 66  AST 15 - 41 U/L _2 ALT 0 - 44 U/L _3 RADIOGRAPHIC STUDIES: I have personally reviewed the radiological images as listed and agreed with the findings in the report. No results found.   ASSESSMENT & PLAN: 54 y.o.    1. Malignant neoplasm in upper outer quadrant of left breast  -diagnosed 04/03/2019, cT1b N0, stage IB invasive ductal carcinoma, grade 3, functionally triple negative (estrogen receptor is weakly positive at 10%, progesterone receptor negative, HER-2 not amplified), with an MIB-1 of 30% -s/p left lumpectomy and sentinel lymph node sampling 04/17/2019 for a pT1c pN0, stage IB invasive ductal carcinoma, grade 3, triple negative, with negative margins - began adjuvant chemotherapy 05/23/19 consisting of cyclophosphamide and docetaxel every 21 days x 4  - adjuvant radiation to follow -due to triple negative disease, she would not benefit from anti-estrogen therapy   2. 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 Schrum appears stable. She completed cycle 1 adjuvant TC. She tolerated moderately well with fatigue, nausea, headache, and bone pain lasting 1-2 weeks. She required more effort with ADLs and thinks she lost some weight but eventually recovered well. I recommend to add Emend to chemo pre-meds and I will call in Zofran PRN to begin on day 3. She will stop compazine and ativan. For persistent fatigue, I recommend to extend Dex but reduce to 1 tab (  4 mg BID) on days 5 and 6. She will take the usual dose on days 2-4. We will change her Fulphila injection to [facility] pending insurance approval.   CBC and CMP reviewed. She  has mild leukocytosis from GCSF and steroids. Mild anemia is stable. BG mildly elevated 147 likely due to non-fasting state and dex. K 3.3, she has been taking supplement once daily and will increase to BID. Overall, adequate to proceed with cycle 2 TC today, no dose modifications.  She will return on 06/20/19 for lab and toxicity check, then 2 weeks later to start cycle 3. The plan was reviewed with Dr. Jana Hakim, pharmacy, and nursing.   All questions were answered. The patient knows to call the clinic with any problems, questions or concerns. No barriers to learning was detected. Total encounter time was 30 minutes.      Alla Feeling, NP 06/13/19

## 2019-06-12 ENCOUNTER — Encounter: Payer: Self-pay | Admitting: Licensed Clinical Social Worker

## 2019-06-12 NOTE — Progress Notes (Signed)
[facility] CSW Progress Note  Clinical Education officer, museum received TC from patient asking about help paying medical bills. She has already spoken with financial advocate. Patient is working intermittently and using leave time for other days. She is a single mom, so having trouble with bills currently. CSW explained options for financial support, including [employer], [employer] in [place], and [employer] which can help with either medical or other bills. CSW will leave applications for [employer] in [place] at front desk for patient to pick up tomorrow and e-mailed online application for [employer] to patient. She will complete her parts and then contact this CSW to submit applications.    Edwinna Areola Audel Coakley , LCSW

## 2019-06-13 ENCOUNTER — Inpatient Hospital Stay: Payer: BC Managed Care – PPO

## 2019-06-13 ENCOUNTER — Other Ambulatory Visit: Payer: Self-pay

## 2019-06-13 ENCOUNTER — Encounter: Payer: Self-pay | Admitting: Nurse Practitioner

## 2019-06-13 ENCOUNTER — Other Ambulatory Visit: Payer: BC Managed Care – PPO

## 2019-06-13 ENCOUNTER — Inpatient Hospital Stay: Payer: BC Managed Care – PPO | Attending: Oncology

## 2019-06-13 ENCOUNTER — Inpatient Hospital Stay (HOSPITAL_BASED_OUTPATIENT_CLINIC_OR_DEPARTMENT_OTHER): Payer: BC Managed Care – PPO | Admitting: Nurse Practitioner

## 2019-06-13 ENCOUNTER — Encounter: Payer: Self-pay | Admitting: *Deleted

## 2019-06-13 ENCOUNTER — Telehealth: Payer: Self-pay | Admitting: Nurse Practitioner

## 2019-06-13 VITALS — BP 160/85 | HR 76 | Temp 98.7°F | Resp 20 | Ht 64.0 in | Wt 310.2 lb

## 2019-06-13 DIAGNOSIS — C50812 Malignant neoplasm of overlapping sites of left female breast: Secondary | ICD-10-CM | POA: Insufficient documentation

## 2019-06-13 DIAGNOSIS — C50412 Malignant neoplasm of upper-outer quadrant of left female breast: Secondary | ICD-10-CM

## 2019-06-13 DIAGNOSIS — Z5111 Encounter for antineoplastic chemotherapy: Secondary | ICD-10-CM | POA: Insufficient documentation

## 2019-06-13 DIAGNOSIS — Z95828 Presence of other vascular implants and grafts: Secondary | ICD-10-CM

## 2019-06-13 DIAGNOSIS — Z170 Estrogen receptor positive status [ER+]: Secondary | ICD-10-CM | POA: Diagnosis not present

## 2019-06-13 DIAGNOSIS — Z5189 Encounter for other specified aftercare: Secondary | ICD-10-CM | POA: Insufficient documentation

## 2019-06-13 DIAGNOSIS — Z1501 Genetic susceptibility to malignant neoplasm of breast: Secondary | ICD-10-CM

## 2019-06-13 DIAGNOSIS — Z90710 Acquired absence of both cervix and uterus: Secondary | ICD-10-CM

## 2019-06-13 LAB — COMPREHENSIVE METABOLIC PANEL WITH GFR
ALT: 22 U/L (ref 0–44)
AST: 16 U/L (ref 15–41)
Albumin: 3.7 g/dL (ref 3.5–5.0)
Alkaline Phosphatase: 69 U/L (ref 38–126)
Anion gap: 10 (ref 5–15)
BUN: 13 mg/dL (ref 6–20)
CO2: 28 mmol/L (ref 22–32)
Calcium: 9.9 mg/dL (ref 8.9–10.3)
Chloride: 103 mmol/L (ref 98–111)
Creatinine, Ser: 0.74 mg/dL (ref 0.44–1.00)
GFR calc Af Amer: >60 mL/min
GFR calc non Af Amer: >60 mL/min
Glucose, Bld: 147 mg/dL — ABNORMAL HIGH (ref 70–99)
Potassium: 3.3 mmol/L — ABNORMAL LOW (ref 3.5–5.1)
Sodium: 141 mmol/L (ref 135–145)
Total Bilirubin: 0.4 mg/dL (ref 0.3–1.2)
Total Protein: 7.3 g/dL (ref 6.5–8.1)

## 2019-06-13 LAB — CBC WITH DIFFERENTIAL/PLATELET
Abs Immature Granulocytes: 0.33 K/uL — ABNORMAL HIGH (ref 0.00–0.07)
Basophils Absolute: 0 K/uL (ref 0.0–0.1)
Basophils Relative: 0 %
Eosinophils Absolute: 0 K/uL (ref 0.0–0.5)
Eosinophils Relative: 0 %
HCT: 34.4 % — ABNORMAL LOW (ref 36.0–46.0)
Hemoglobin: 11 g/dL — ABNORMAL LOW (ref 12.0–15.0)
Immature Granulocytes: 2 %
Lymphocytes Relative: 8 %
Lymphs Abs: 1 K/uL (ref 0.7–4.0)
MCH: 26.3 pg (ref 26.0–34.0)
MCHC: 32 g/dL (ref 30.0–36.0)
MCV: 82.1 fL (ref 80.0–100.0)
Monocytes Absolute: 0.3 K/uL (ref 0.1–1.0)
Monocytes Relative: 2 %
Neutro Abs: 12.1 K/uL — ABNORMAL HIGH (ref 1.7–7.7)
Neutrophils Relative %: 88 %
Platelets: 237 K/uL (ref 150–400)
RBC: 4.19 MIL/uL (ref 3.87–5.11)
RDW: 14.6 % (ref 11.5–15.5)
WBC: 13.8 K/uL — ABNORMAL HIGH (ref 4.0–10.5)
nRBC: 0 % (ref 0.0–0.2)

## 2019-06-13 MED ORDER — SODIUM CHLORIDE 0.9 % IV SOLN
10.0000 mg | Freq: Once | INTRAVENOUS | Status: AC
  Administered 2019-06-13: 10 mg via INTRAVENOUS
  Filled 2019-06-13: qty 10

## 2019-06-13 MED ORDER — SODIUM CHLORIDE 0.9 % IV SOLN
150.0000 mg | Freq: Once | INTRAVENOUS | Status: AC
  Administered 2019-06-13: 150 mg via INTRAVENOUS
  Filled 2019-06-13: qty 150

## 2019-06-13 MED ORDER — SODIUM CHLORIDE 0.9% FLUSH
10.0000 mL | INTRAVENOUS | Status: DC | PRN
  Administered 2019-06-13: 10 mL via INTRAVENOUS
  Filled 2019-06-13: qty 10

## 2019-06-13 MED ORDER — PALONOSETRON HCL INJECTION 0.25 MG/5ML
0.2500 mg | Freq: Once | INTRAVENOUS | Status: AC
  Administered 2019-06-13: 0.25 mg via INTRAVENOUS

## 2019-06-13 MED ORDER — PALONOSETRON HCL INJECTION 0.25 MG/5ML
INTRAVENOUS | Status: AC
  Filled 2019-06-13: qty 5

## 2019-06-13 MED ORDER — SODIUM CHLORIDE 0.9 % IV SOLN
600.0000 mg/m2 | Freq: Once | INTRAVENOUS | Status: AC
  Administered 2019-06-13: 1500 mg via INTRAVENOUS
  Filled 2019-06-13: qty 75

## 2019-06-13 MED ORDER — SODIUM CHLORIDE 0.9 % IV SOLN
75.0000 mg/m2 | Freq: Once | INTRAVENOUS | Status: AC
  Administered 2019-06-13: 190 mg via INTRAVENOUS
  Filled 2019-06-13: qty 19

## 2019-06-13 MED ORDER — PEGFILGRASTIM 6 MG/0.6ML ~~LOC~~ PSKT
PREFILLED_SYRINGE | SUBCUTANEOUS | Status: AC
  Filled 2019-06-13: qty 0.6

## 2019-06-13 MED ORDER — ONDANSETRON HCL 8 MG PO TABS: 8.0000 mg | ORAL_TABLET | Freq: Three times a day (TID) | ORAL | 1 refills | Status: DC | PRN

## 2019-06-13 MED ORDER — PEGFILGRASTIM 6 MG/0.6ML ~~LOC~~ PSKT
6.0000 mg | PREFILLED_SYRINGE | Freq: Once | SUBCUTANEOUS | Status: AC
  Administered 2019-06-13: 14:00:00 6 mg via SUBCUTANEOUS

## 2019-06-13 MED ORDER — HEPARIN SOD (PORK) LOCK FLUSH 100 UNIT/ML IV SOLN
500.0000 [IU] | Freq: Once | INTRAVENOUS | Status: AC | PRN
  Administered 2019-06-13: 500 [IU]
  Filled 2019-06-13: qty 5

## 2019-06-13 MED ORDER — SODIUM CHLORIDE 0.9 % IV SOLN
Freq: Once | INTRAVENOUS | Status: AC
  Filled 2019-06-13: qty 250

## 2019-06-13 MED ORDER — SODIUM CHLORIDE 0.9% FLUSH
10.0000 mL | INTRAVENOUS | Status: DC | PRN
  Administered 2019-06-13: 10 mL
  Filled 2019-06-13: qty 10

## 2019-06-13 NOTE — Patient Instructions (Signed)

## 2019-06-13 NOTE — Patient Instructions (Signed)
[facility] Discharge Instructions for Patients Receiving Chemotherapy  Today you received the following chemotherapy agents Taxotere & Cytoxan  To help prevent nausea and vomiting after your treatment, we encourage you to take your nausea medication as directed.   If you develop nausea and vomiting that is not controlled by your nausea medication, call the clinic.   BELOW ARE SYMPTOMS THAT SHOULD BE REPORTED IMMEDIATELY:  *FEVER GREATER THAN 100.5 F  *CHILLS WITH OR WITHOUT FEVER  NAUSEA AND VOMITING THAT IS NOT CONTROLLED WITH YOUR NAUSEA MEDICATION  *UNUSUAL SHORTNESS OF BREATH  *UNUSUAL BRUISING OR BLEEDING  TENDERNESS IN MOUTH AND THROAT WITH OR WITHOUT PRESENCE OF ULCERS  *URINARY PROBLEMS  *BOWEL PROBLEMS  UNUSUAL RASH Items with * indicate a potential emergency and should be followed up as soon as possible.  Feel free to call the clinic should you have any questions or concerns. The clinic phone number is [phone].  Please show the CHEMO ALERT CARD at check-in to the Emergency Department and triage nurse.  Docetaxel injection What is this medicine? DOCETAXEL (doe se TAX el) is a chemotherapy drug. It targets fast dividing cells, like cancer cells, and causes these cells to die. This medicine is used to treat many types of cancers like breast cancer, certain stomach cancers, head and neck cancer, lung cancer, and prostate cancer. This medicine may be used for other purposes; ask your health care provider or pharmacist if you have questions. COMMON BRAND NAME(S): Docefrez, Taxotere What should I tell my health care provider before I take this medicine? They need to know if you have any of these conditions:  infection (especially a virus infection such as chickenpox, cold sores, or herpes)  liver disease  low blood counts, like low white cell, platelet, or red cell counts  an unusual or allergic reaction to docetaxel, polysorbate 80, other  chemotherapy agents, other medicines, foods, dyes, or preservatives  pregnant or trying to get pregnant  breast-feeding How should I use this medicine? This drug is given as an infusion into a vein. It is administered in a hospital or clinic by a specially trained health care professional. Talk to your pediatrician regarding the use of this medicine in children. Special care may be needed. Overdosage: If you think you have taken too much of this medicine contact a poison control center or emergency room at once. NOTE: This medicine is only for you. Do not share this medicine with others. What if I miss a dose? It is important not to miss your dose. Call your doctor or health care professional if you are unable to keep an appointment. What may interact with this medicine?  aprepitant  certain antibiotics like erythromycin or clarithromycin  certain antivirals for HIV or hepatitis  certain medicines for fungal infections like fluconazole, itraconazole, ketoconazole, posaconazole, or voriconazole  cimetidine  ciprofloxacin  conivaptan  cyclosporine  dronedarone  fluvoxamine  grapefruit juice  imatinib  verapamil This list may not describe all possible interactions. Give your health care provider a list of all the medicines, herbs, non-prescription drugs, or dietary supplements you use. Also tell them if you smoke, drink alcohol, or use illegal drugs. Some items may interact with your medicine. What should I watch for while using this medicine? Your condition will be monitored carefully while you are receiving this medicine. You will need important blood work done while you are taking this medicine. Call your doctor or health care professional for advice if you get a fever,   chills or sore throat, or other symptoms of a cold or flu. Do not treat yourself. This drug decreases your body's ability to fight infections. Try to avoid being around people who are sick. Some products may  contain alcohol. Ask your health care professional if this medicine contains alcohol. Be sure to tell all health care professionals you are taking this medicine. Certain medicines, like metronidazole and disulfiram, can cause an unpleasant reaction when taken with alcohol. The reaction includes flushing, headache, nausea, vomiting, sweating, and increased thirst. The reaction can last from 30 minutes to several hours. You may get drowsy or dizzy. Do not drive, use machinery, or do anything that needs mental alertness until you know how this medicine affects you. Do not stand or sit up quickly, especially if you are an older patient. This reduces the risk of dizzy or fainting spells. Alcohol may interfere with the effect of this medicine. Talk to your health care professional about your risk of cancer. You may be more at risk for certain types of cancer if you take this medicine. Do not become pregnant while taking this medicine or for 6 months after stopping it. Women should inform their doctor if they wish to become pregnant or think they might be pregnant. There is a potential for serious side effects to an unborn child. Talk to your health care professional or pharmacist for more information. Do not breast-feed an infant while taking this medicine or for 1 week after stopping it. Males who get this medicine must use a condom during sex with females who can get pregnant. If you get a woman pregnant, the baby could have birth defects. The baby could die before they are born. You will need to continue wearing a condom for 3 months after stopping the medicine. Tell your health care provider right away if your partner becomes pregnant while you are taking this medicine. This may interfere with the ability to father a child. You should talk to your doctor or health care professional if you are concerned about your fertility. What side effects may I notice from receiving this medicine? Side effects that you  should report to your doctor or health care professional as soon as possible:  allergic reactions like skin rash, itching or hives, swelling of the face, lips, or tongue  blurred vision  breathing problems  changes in vision  low blood counts - This drug may decrease the number of white blood cells, red blood cells and platelets. You may be at increased risk for infections and bleeding.  nausea and vomiting  pain, redness or irritation at site where injected  pain, tingling, numbness in the hands or feet  redness, blistering, peeling, or loosening of the skin, including inside the mouth  signs of decreased platelets or bleeding - bruising, pinpoint red spots on the skin, black, tarry stools, nosebleeds  signs of decreased red blood cells - unusually weak or tired, fainting spells, lightheadedness  signs of infection - fever or chills, cough, sore throat, pain or difficulty passing urine  swelling of the ankle, feet, hands Side effects that usually do not require medical attention (report to your doctor or health care professional if they continue or are bothersome):  constipation  diarrhea  fingernail or toenail changes  hair loss  loss of appetite  mouth sores  muscle pain This list may not describe all possible side effects. Call your doctor for medical advice about side effects. You may report side effects to FDA at 1-800-FDA-1088.   Where should I keep my medicine? This drug is given in a hospital or clinic and will not be stored at home. NOTE: This sheet is a summary. It may not cover all possible information. If you have questions about this medicine, talk to your doctor, pharmacist, or health care provider.  2020 [employer]vier/Gold Standard (2018-10-11 10:19:06)  Cyclophosphamide Injection What is this medicine? CYCLOPHOSPHAMIDE (sye kloe FOSS fa mide) is a chemotherapy drug. It slows the growth of cancer cells. This medicine is used to treat many types of cancer  like lymphoma, myeloma, leukemia, breast cancer, and ovarian cancer, to name a few. This medicine may be used for other purposes; ask your health care provider or pharmacist if you have questions. COMMON BRAND NAME(S): Cytoxan, Neosar What should I tell my health care provider before I take this medicine? They need to know if you have any of these conditions:  heart disease  history of irregular heartbeat  infection  kidney disease  liver disease  low blood counts, like white cells, platelets, or red blood cells  on hemodialysis  recent or ongoing radiation therapy  scarring or thickening of the lungs  trouble passing urine  an unusual or allergic reaction to cyclophosphamide, other medicines, foods, dyes, or preservatives  pregnant or trying to get pregnant  breast-feeding How should I use this medicine? This drug is usually given as an injection into a vein or muscle or by infusion into a vein. It is administered in a hospital or clinic by a specially trained health care professional. Talk to your pediatrician regarding the use of this medicine in children. Special care may be needed. Overdosage: If you think you have taken too much of this medicine contact a poison control center or emergency room at once. NOTE: This medicine is only for you. Do not share this medicine with others. What if I miss a dose? It is important not to miss your dose. Call your doctor or health care professional if you are unable to keep an appointment. What may interact with this medicine?  amphotericin B  azathioprine  certain antivirals for HIV or hepatitis  certain medicines for blood pressure, heart disease, irregular heart beat  certain medicines that treat or prevent blood clots like warfarin  certain other medicines for cancer  cyclosporine  etanercept  indomethacin  medicines that relax muscles for surgery  medicines to increase blood counts  metronidazole This list  may not describe all possible interactions. Give your health care provider a list of all the medicines, herbs, non-prescription drugs, or dietary supplements you use. Also tell them if you smoke, drink alcohol, or use illegal drugs. Some items may interact with your medicine. What should I watch for while using this medicine? Your condition will be monitored carefully while you are receiving this medicine. You may need blood work done while you are taking this medicine. Drink water or other fluids as directed. Urinate often, even at night. Some products may contain alcohol. Ask your health care professional if this medicine contains alcohol. Be sure to tell all health care professionals you are taking this medicine. Certain medicines, like metronidazole and disulfiram, can cause an unpleasant reaction when taken with alcohol. The reaction includes flushing, headache, nausea, vomiting, sweating, and increased thirst. The reaction can last from 30 minutes to several hours. Do not become pregnant while taking this medicine or for 1 year after stopping it. Women should inform their health care professional if they wish to become pregnant or think they   might be pregnant. Men should not father a child while taking this medicine and for 4 months after stopping it. There is potential for serious side effects to an unborn child. Talk to your health care professional for more information. Do not breast-feed an infant while taking this medicine or for 1 week after stopping it. This medicine has caused ovarian failure in some women. This medicine may make it more difficult to get pregnant. Talk to your health care professional if you are concerned about your fertility. This medicine has caused decreased sperm counts in some men. This may make it more difficult to father a child. Talk to your health care professional if you are concerned about your fertility. Call your health care professional for advice if you get a  fever, chills, or sore throat, or other symptoms of a cold or flu. Do not treat yourself. This medicine decreases your body's ability to fight infections. Try to avoid being around people who are sick. Avoid taking medicines that contain aspirin, acetaminophen, ibuprofen, naproxen, or ketoprofen unless instructed by your health care professional. These medicines may hide a fever. Talk to your health care professional about your risk of cancer. You may be more at risk for certain types of cancer if you take this medicine. If you are going to need surgery or other procedure, tell your health care professional that you are using this medicine. Be careful brushing or flossing your teeth or using a toothpick because you may get an infection or bleed more easily. If you have any dental work done, tell your dentist you are receiving this medicine. What side effects may I notice from receiving this medicine? Side effects that you should report to your doctor or health care professional as soon as possible:  allergic reactions like skin rash, itching or hives, swelling of the face, lips, or tongue  breathing problems  nausea, vomiting  signs and symptoms of bleeding such as bloody or black, tarry stools; red or dark brown urine; spitting up blood or brown material that looks like coffee grounds; red spots on the skin; unusual bruising or bleeding from the eyes, gums, or nose  signs and symptoms of heart failure like fast, irregular heartbeat, sudden weight gain; swelling of the ankles, feet, hands  signs and symptoms of infection like fever; chills; cough; sore throat; pain or trouble passing urine  signs and symptoms of kidney injury like trouble passing urine or change in the amount of urine  signs and symptoms of liver injury like dark yellow or brown urine; general ill feeling or flu-like symptoms; light-colored stools; loss of appetite; nausea; right upper belly pain; unusually weak or tired;  yellowing of the eyes or skin Side effects that usually do not require medical attention (report to your doctor or health care professional if they continue or are bothersome):  confusion  decreased hearing  diarrhea  facial flushing  hair loss  headache  loss of appetite  missed menstrual periods  signs and symptoms of low red blood cells or anemia such as unusually weak or tired; feeling faint or lightheaded; falls  skin discoloration This list may not describe all possible side effects. Call your doctor for medical advice about side effects. You may report side effects to FDA at 1-800-FDA-1088. Where should I keep my medicine? This drug is given in a hospital or clinic and will not be stored at home. NOTE: This sheet is a summary. It may not cover all possible information. If you have   questions about this medicine, talk to your doctor, pharmacist, or health care provider.  2020 [employer]vier/Gold Standard (2018-11-19 09:53:29)     

## 2019-06-13 NOTE — Telephone Encounter (Signed)
Scheduled appt per 4/15 los.  Spoke with pt and she is aware of the appt date and time

## 2019-06-14 ENCOUNTER — Encounter: Payer: Self-pay | Admitting: Oncology

## 2019-06-20 ENCOUNTER — Other Ambulatory Visit: Payer: Self-pay

## 2019-06-20 ENCOUNTER — Inpatient Hospital Stay: Payer: BC Managed Care – PPO

## 2019-06-20 ENCOUNTER — Inpatient Hospital Stay (HOSPITAL_BASED_OUTPATIENT_CLINIC_OR_DEPARTMENT_OTHER): Payer: BC Managed Care – PPO | Admitting: Oncology

## 2019-06-20 VITALS — BP 132/78 | HR 76 | Temp 98.7°F | Resp 20 | Ht 64.0 in | Wt 305.2 lb

## 2019-06-20 DIAGNOSIS — C50412 Malignant neoplasm of upper-outer quadrant of left female breast: Secondary | ICD-10-CM | POA: Diagnosis not present

## 2019-06-20 DIAGNOSIS — Z1501 Genetic susceptibility to malignant neoplasm of breast: Secondary | ICD-10-CM

## 2019-06-20 DIAGNOSIS — Z5111 Encounter for antineoplastic chemotherapy: Secondary | ICD-10-CM | POA: Diagnosis not present

## 2019-06-20 DIAGNOSIS — Z90710 Acquired absence of both cervix and uterus: Secondary | ICD-10-CM

## 2019-06-20 DIAGNOSIS — Z170 Estrogen receptor positive status [ER+]: Secondary | ICD-10-CM

## 2019-06-20 LAB — COMPREHENSIVE METABOLIC PANEL
ALT: 24 U/L (ref 0–44)
AST: 21 U/L (ref 15–41)
Albumin: 3.2 g/dL — ABNORMAL LOW (ref 3.5–5.0)
Alkaline Phosphatase: 82 U/L (ref 38–126)
Anion gap: 8 (ref 5–15)
BUN: 14 mg/dL (ref 6–20)
CO2: 33 mmol/L — ABNORMAL HIGH (ref 22–32)
Calcium: 9.1 mg/dL (ref 8.9–10.3)
Chloride: 99 mmol/L (ref 98–111)
Creatinine, Ser: 1.02 mg/dL — ABNORMAL HIGH (ref 0.44–1.00)
GFR calc Af Amer: >60 mL/min (ref >60)
GFR calc non Af Amer: >60 mL/min (ref >60)
Glucose, Bld: 95 mg/dL (ref 70–99)
Potassium: 3.6 mmol/L (ref 3.5–5.1)
Sodium: 140 mmol/L (ref 135–145)
Total Bilirubin: 0.5 mg/dL (ref 0.3–1.2)
Total Protein: 6.3 g/dL — ABNORMAL LOW (ref 6.5–8.1)

## 2019-06-20 LAB — CBC WITH DIFFERENTIAL/PLATELET
Abs Immature Granulocytes: 0.3 10*3/uL — ABNORMAL HIGH (ref 0.00–0.07)
Band Neutrophils: 8 %
Basophils Absolute: 0.1 10*3/uL (ref 0.0–0.1)
Basophils Relative: 2 %
Eosinophils Absolute: 0.1 10*3/uL (ref 0.0–0.5)
Eosinophils Relative: 2 %
HCT: 36.3 % (ref 36.0–46.0)
Hemoglobin: 11.3 g/dL — ABNORMAL LOW (ref 12.0–15.0)
Lymphocytes Relative: 38 %
Lymphs Abs: 2.4 10*3/uL (ref 0.7–4.0)
MCH: 26.3 pg (ref 26.0–34.0)
MCHC: 31.1 g/dL (ref 30.0–36.0)
MCV: 84.6 fL (ref 80.0–100.0)
Metamyelocytes Relative: 1 %
Monocytes Absolute: 1.7 10*3/uL — ABNORMAL HIGH (ref 0.1–1.0)
Monocytes Relative: 27 %
Myelocytes: 3 %
Neutro Abs: 1.7 10*3/uL (ref 1.7–7.7)
Neutrophils Relative %: 19 %
Platelets: 159 10*3/uL (ref 150–400)
RBC: 4.29 MIL/uL (ref 3.87–5.11)
RDW: 14.6 % (ref 11.5–15.5)
WBC: 6.4 10*3/uL (ref 4.0–10.5)
nRBC: 1.9 % — ABNORMAL HIGH (ref 0.0–0.2)

## 2019-06-20 NOTE — Progress Notes (Signed)
[place]  [phone] [phone]     ID: Loretta Nelson DOB: Apr 07, 1965  MR#: [id number]  VOZ#:[id number]  Patient Care Team: [name], MD as PCP - General (Family Medicine) Mauro Kaufmann, RN as Oncology Nurse Navigator Rockwell Germany, RN as Oncology Nurse Navigator Donnie Mesa, MD as Consulting Physician (General Surgery) Richmond Coldren, Virgie Dad, MD as Consulting Physician (Oncology) Kyung Rudd, MD as Consulting Physician (Radiation Oncology) Delrae Rend, MD as Consulting Physician (Endocrinology) Star Age, MD as Consulting Physician (Neurology) Garvin Fila, MD as Consulting Physician (Neurology) Christophe Louis, MD as Consulting Physician (Obstetrics and Gynecology) Arta Silence, MD as Consulting Physician (Gastroenterology) Chauncey Cruel, MD OTHER MD:  CHIEF COMPLAINT: triple negative breast cancer  CURRENT TREATMENT: Adjuvant chemotherapy   INTERVAL HISTORY: [name] returns today for follow up of her triple negative breast cancer accompanied by her daughter Loretta Nelson.   She began her adjuvant chemotherapy consisting of cyclophosphamide and docetaxel on 05/23/2019. The plan is for her to receive this every 21 days for 4 cycles.  Today is cycle 2 day 9.   REVIEW OF SYSTEMS: Paitynn feels she did better with the second cycle than the first.  There was less nausea.  She had no vomiting.  She had less leg pain.  Partly that is because there generally is less leg pain the second time and also because she took more Tylenol.  She has had no mouth sores and no problems with her port.  She has extreme fatigue however.  She is only just beginning to get over that.  She feels the issue is that her CPAP does not seem to fit well on days 6 7 and 8 of each cycle.  This keeps her from sleeping well.  She has taste perversion.  She has a history of esophageal stricture status post remote stretching and she wonders if that is beginning to  act up again.  She has had a little bit of bit of a pain in the surgical breast.  Otherwise a detailed review of systems today was stable   HISTORY OF CURRENT ILLNESS: From the original intake note:  Loretta Nelson had routine screening mammography on 03/19/2019 showing a possible abnormality in the left breast. She underwent left diagnostic mammography with tomography and left breast ultrasonography at [facility] on 03/29/2019 showing: breast density category B; 8 mm mass in left breast at 12 o'clock; left axilla negative for lymphadenopathy.  Accordingly on 04/03/2019 she proceeded to biopsy of the left breast area in question. The pathology from this procedure ([id number]) showed: invasive mammary carcinoma, grade 3, e-cadherin positive. Prognostic indicators significant for: estrogen receptor, 10% positive with weak staining intensity and progesterone receptor, 0% negative. Proliferation marker Ki67 at 30%. HER2 negative by immunohistochemistry (1+).  The patient's subsequent history is as detailed below.   PAST MEDICAL HISTORY: Past Medical History:  Diagnosis Date   Allergic rhinitis    Anemia    low iron   Anemia    Anxiety    Anxiety    Arthritis    right knee, surgery done   Cancer [facility]) 2021   left breast    Complication of anesthesia    woke up during EGD   Esophageal stricture    Family history of breast cancer    GERD (gastroesophageal reflux disease)    Graves disease 01/13/11   radioactive iodine treatment, 56.4 millicuries   Headache    tension headaches   Hiatal  hernia    small   Hypertension    Hyperthyroidism    Hypothyroidism    s/p treatment   Murmur    benign, h/o, caused by hyperdynamic contraction   Obesity    Pneumonia 2017   inhaler prescribed   Sleep apnea 2021   no CPAP as yet, last study done lastnight 04/15/2019   Tendonitis of shoulder, right    Vitamin D deficiency    Vitamin D deficiency     PAST  SURGICAL HISTORY: Past Surgical History:  Procedure Laterality Date   BALLOON DILATION N/A 09/07/2016   Procedure: BALLOON DILATION;  Surgeon: Arta Silence, MD;  Location: [facility] ENDOSCOPY;  Service: Endoscopy;  Laterality: N/A;   BREAST LUMPECTOMY WITH RADIOACTIVE SEED AND SENTINEL LYMPH NODE BIOPSY Left 04/17/2019   Procedure: LEFT BREAST LUMPECTOMY WITH RADIOACTIVE SEED AND SENTINEL LYMPH NODE BIOPSY;  Surgeon: Donnie Mesa, MD;  Location: [place];  Service: General;  Laterality: Left;  LMA, PEC BLOCK   CESAREAN SECTION  1999   COLONOSCOPY     ESOPHAGOGASTRODUODENOSCOPY     ESOPHAGOGASTRODUODENOSCOPY (EGD) WITH PROPOFOL N/A 09/07/2016   Procedure: ESOPHAGOGASTRODUODENOSCOPY (EGD) WITH PROPOFOL;  Surgeon: Arta Silence, MD;  Location: [place];  Service: Endoscopy;  Laterality: N/A;   HYSTERECTOMY ABDOMINAL WITH SALPINGECTOMY Bilateral 02/17/2017   Procedure: HYSTERECTOMY ABDOMINAL WITH SALPINGECTOMY;  Surgeon: Christophe Louis, MD;  Location: [facility];  Service: Gynecology;  Laterality: Bilateral;   KNEE ARTHROSCOPY     for torn meniscus   PORTACATH PLACEMENT Right 05/22/2019   Procedure: INSERTION PORT-A-CATH WITH ULTRASOUND GUIDANCE;  Surgeon: Donnie Mesa, MD;  Location: [facility];  Service: General;  Laterality: Right;  Endotrachial tube   WISDOM TOOTH EXTRACTION     WISDOM TOOTH EXTRACTION      FAMILY HISTORY: Family History  Problem Relation Age of Onset   Hypertension Mother    CAD Maternal Grandfather    Breast cancer Paternal Grandmother    HIV Brother    Heart attack Brother        sudden MI vs PE   Breast cancer Sister 45   Breast cancer Other        one dx 28s, others dx in 56s   Colon polyps Neg Hx    Colon cancer Neg Hx    Liver disease Neg Hx    Patient's father was in his late 90s when he died from causes unknown to the patient.  Patient's mother is 68 years old (as of 04/2019). The patient denies a family hx of ovarian cancer. She reports breast  cancer in her sister at age 82, in her paternal grandmother, and in 3 paternal cousins. She had 4 siblings, 2 brothers and 2 sisters, though only her sisters are still living.   GYNECOLOGIC HISTORY:  Patient's last menstrual period was 02/13/2017 (exact date). Menarche: 54 years old Age at first live birth: 54 years old Gray P 2 (twins) LMP 01/2017 Contraceptive: used from [id number] without complication HRT never used  Hysterectomy? Yes, 01/2017, benign pathology BSO? 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 She is divorced. She lives at home with her twin daughters, Maryjo Rochester and Edmon Crape, who are 54 years old fraternal twins and attending college remotely. Maryjo Rochester is studying music and communications at [employer].Edmon Crape is studying IT.    ADVANCED DIRECTIVES: Not in place; the patient intends to name her sister Madelon Lips as her  HCPOA [phone])   HEALTH MAINTENANCE: Social History   Tobacco Use   Smoking status: Never Smoker   Smokeless tobacco: Never Used  Substance Use Topics   Alcohol use: No    Alcohol/week: 0.0 standard drinks   Drug use: No     Colonoscopy: at age 84  PAP: 02/2018  Bone density: never done   No Known Allergies  Current Outpatient Medications  Medication Sig Dispense Refill   amLODipine (NORVASC) 5 MG tablet Take 5 mg by mouth daily.   5   Cholecalciferol 4000 UNITS CAPS Take 4,000 Units by mouth daily.      ciprofloxacin (CIPRO) 500 MG tablet Take 1 tablet (500 mg total) by mouth 2 (two) times daily. Take for 5 days starting one week after a chemotherapy dose 40 tablet 0   citalopram (CELEXA) 40 MG tablet Take 40 mg by mouth daily.     dexamethasone (DECADRON) 4 MG tablet Take 2 tablets (8 mg total) by mouth 2 (two) times daily. Start the day before Taxotere. Then again the day after chemo for 3 days. 30 tablet 1    EDARBYCLOR 40-25 MG TABS Take 1 tablet by mouth daily.  5   ibuprofen (ADVIL) 200 MG tablet Take 400 mg by mouth every 6 (six) hours as needed for moderate pain.     KLOR-CON M20 20 MEQ tablet Take 20 mEq by mouth 2 (two) times daily.   1   L-Methylfolate-B12-B6-B2 (CEREFOLIN) 07-29-48-5 MG TABS Take 1 tablet by mouth every morning. 90 tablet 3   levothyroxine (SYNTHROID) 175 MCG tablet Take 175 mcg by mouth daily before breakfast.   5   lidocaine-prilocaine (EMLA) cream Apply to affected area once (Patient taking differently: Apply 1 application topically daily as needed (port access). ) 30 g 3   loratadine (CLARITIN) 10 MG tablet TAKE 1 TABLET BY MOUTH EVERY DAY (Patient taking differently: Take 10 mg by mouth daily. ) 90 tablet 1   LORazepam (ATIVAN) 0.5 MG tablet Take 1 tablet (0.5 mg total) by mouth every 6 (six) hours as needed (Nausea or vomiting). 30 tablet 0   MELATONIN PO Take 1 tablet by mouth at bedtime as needed (sleep).     metoprolol (TOPROL-XL) 200 MG 24 hr tablet Take 200 mg by mouth daily.   5   omeprazole (PRILOSEC) 40 MG capsule Take 40 mg by mouth See admin instructions. Take 40 mg daily, may take a second 40 mg dose as needed for acid reflux     ondansetron (ZOFRAN) 8 MG tablet Take 1 tablet (8 mg total) by mouth every 8 (eight) hours as needed for nausea or vomiting. Start on day 3 after chemotherapy 30 tablet 1   prochlorperazine (COMPAZINE) 10 MG tablet Take 1 tablet (10 mg total) by mouth every 6 (six) hours as needed (Nausea or vomiting). 30 tablet 1   spironolactone (ALDACTONE) 50 MG tablet Take 50 mg by mouth daily.     No current facility-administered medications for this visit.    OBJECTIVE: African-American woman in no acute distress  Vitals:   06/20/19 1450  BP: 132/78  Pulse: 76  Resp: 20  Temp: 98.7 F (37.1 C)  SpO2: 96%     Body mass index is 52.39 kg/m.   Wt Readings from Last 3 Encounters:  06/20/19 (!) 305 lb 3.2 oz (138.4 kg)    06/13/19 (!) 310 lb 3.2 oz (140.7 kg)  05/29/19 (!) 305 lb 9.6 oz (138.6 kg)  ECOG FS:1 - Symptomatic but completely ambulatory  Sclerae unicteric, EOMs intact Wearing a mask No cervical or supraclavicular adenopathy Lungs no rales or rhonchi Heart regular rate and rhythm Abd soft, nontender, positive bowel sounds MSK no focal spinal tenderness, no upper extremity lymphedema Neuro: nonfocal, well oriented, appropriate affect Breasts:    LAB RESULTS:  CMP     Component Value Date/Time   NA 140 06/20/2019 1434   K 3.6 06/20/2019 1434   CL 99 06/20/2019 1434   CO2 33 (H) 06/20/2019 1434   GLUCOSE 95 06/20/2019 1434   BUN 14 06/20/2019 1434   CREATININE 1.02 (H) 06/20/2019 1434   CREATININE 0.83 04/10/2019 1240   CALCIUM 9.1 06/20/2019 1434   PROT 6.3 (L) 06/20/2019 1434   ALBUMIN 3.2 (L) 06/20/2019 1434   AST 21 06/20/2019 1434   AST 17 04/10/2019 1240   ALT 24 06/20/2019 1434   ALT 19 04/10/2019 1240   ALKPHOS 82 06/20/2019 1434   BILITOT 0.5 06/20/2019 1434   BILITOT 0.4 04/10/2019 1240   GFRNONAA >60 06/20/2019 1434   GFRNONAA >60 04/10/2019 1240   GFRAA >60 06/20/2019 1434   GFRAA >60 04/10/2019 1240    No results found for: TOTALPROTELP, ALBUMINELP, A1GS, A2GS, BETS, BETA2SER, GAMS, MSPIKE, SPEI  Lab Results  Component Value Date   WBC 6.4 06/20/2019   NEUTROABS PENDING 06/20/2019   HGB 11.3 (L) 06/20/2019   HCT 36.3 06/20/2019   MCV 84.6 06/20/2019   PLT 159 06/20/2019    No results found for: LABCA2  No components found for: [id number]  No results for input(s): INR in the last 168 hours.  No results found for: LABCA2  No results found for: IEP329  No results found for: JJO841  No results found for: YSA630  No results found for: CA2729  No components found for: HGQUANT  No results found for: CEA1 / No results found for: CEA1   No results found for: AFPTUMOR  No results found for: CHROMOGRNA  No results found for: KPAFRELGTCHN,  LAMBDASER, KAPLAMBRATIO (kappa/lambda light chains)  No results found for: HGBA, HGBA2QUANT, HGBFQUANT, HGBSQUAN (Hemoglobinopathy evaluation)   No results found for: LDH  No results found for: IRON, TIBC, IRONPCTSAT (Iron and TIBC)  No results found for: FERRITIN  Urinalysis No results found for: COLORURINE, APPEARANCEUR, LABSPEC, PHURINE, GLUCOSEU, HGBUR, BILIRUBINUR, KETONESUR, PROTEINUR, UROBILINOGEN, NITRITE, LEUKOCYTESUR   STUDIES: DG CHEST PORT 1 VIEW  Result Date: 05/22/2019 CLINICAL DATA:  Central line placement EXAM: PORTABLE CHEST 1 VIEW COMPARISON:  Portable exam 1010 hours compared to 03/07/2016 FINDINGS: RIGHT jugular Port-A-Cath with tip projecting over high RIGHT atrium. Enlargement of cardiac silhouette with vascular congestion. Mediastinal contours grossly unremarkable for rotation to the RIGHT. Ice pack projects over RIGHT shoulder. Decreased lung volumes with mild bibasilar atelectasis. No infiltrate, pleural effusion or pneumothorax. IMPRESSION: Bibasilar atelectasis. No pneumothorax following Port-A-Cath placement. Electronically Signed   By: Lavonia Dana M.D.   On: 05/22/2019 10:18   DG C-Arm 1-60 Min-No Report  Result Date: 05/22/2019 Fluoroscopy was utilized by the requesting physician.  No radiographic interpretation.     ELIGIBLE FOR AVAILABLE RESEARCH PROTOCOL:BR003?--Depending on final surgical pathology  ASSESSMENT: 54 y.o. [place] woman status post left breast upper outer quadrant biopsy 04/03/2019 for a clinical T1b N0, stage IB invasive ductal carcinoma, grade 3, functionally triple negative (estrogen receptor is weakly positive at 10%, progesterone receptor negative, HER-2 not amplified), with an MIB-1 of 30%  (1) status post left lumpectomy and sentinel lymph node  sampling 04/17/2019 for a pT1c pN0, stage IB invasive ductal carcinoma, grade 3, triple negative, with negative margins  (a) a total of 2 sentinel lymph nodes were removed  (2)  adjuvant chemotherapy consisting of cyclophosphamide and docetaxel every 21 days x 4 to start 05/23/2019  (3) adjuvant radiation to follow  (4) genetics testing 04/19/2019 through the [facility] Breast Cancer STAT Panel + Common Hereditary Cancers Panel 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 STK11, TP53, TSC1, TSC2, and VHL.  The following genes were evaluated for sequence changes only: SDHA and HOXB13 c.251G>A variant only.  (b) current data suggests an increase risk of breast and ovarian cancer in patients carrying a BARD1 mutation, but data is preliminary and insufficient for definitive recommendations   PLAN: Yosselyn did better with her second cycle of chemotherapy.  She knew what to expect and she also made some slight changes which made a big difference.  She had less nausea, less pain, and instead of being constipated at slightly loose stools.  She felt that the OnPro worked very well for her.  That is scheduled for her next 2 cycles.  I do not think she needs to take antibiotics prophylactically since she has an excellent ANC today.  She will return to see [place] and for her third cycle of chemotherapy 07/04/2019.  She will see me a week after that, she will then see [place] again on 07/25/2019, and a visit with me June 3.  Total encounter time 25 minutes.   Chauncey Cruel, MD   06/20/2019 3:26 PM Medical Oncology and  Hematology [facility] [place], [place] [address] Tel. [phone]    Fax. [phone]   This document serves as a record of services personally performed by Lurline Del, MD. It was created on his behalf by Wilburn Mylar, a trained medical scribe. The creation of this record is based on the scribe's personal observations and the provider's statements to them.   I, Lurline Del MD, have reviewed the above documentation for accuracy and completeness, and I agree with the above.   *Total Encounter Time as defined by the Centers for Medicare and Medicaid Services includes, in addition to the face-to-face time of a patient visit (documented in the note above) non-face-to-face time: obtaining and reviewing outside history, ordering and reviewing medications, tests or procedures, care coordination (communications with other health care professionals or caregivers) and documentation in the medical record.

## 2019-06-21 ENCOUNTER — Telehealth: Payer: Self-pay | Admitting: Oncology

## 2019-06-21 NOTE — Telephone Encounter (Signed)
Scheduled appts per 4/22 los. Pt to get updated appt calender at next visit. Appt notes added.

## 2019-06-28 NOTE — Progress Notes (Signed)
Pharmacist Chemotherapy Monitoring - Follow Up Assessment    I verify that I have reviewed each item in the below checklist:  . Regimen for the patient is scheduled for the appropriate day and plan matches scheduled date. Marland Kitchen Appropriate non-routine labs are ordered dependent on drug ordered. . If applicable, additional medications reviewed and ordered per protocol based on lifetime cumulative doses and/or treatment regimen.   Plan for follow-up and/or issues identified: Yes . I-vent associated with next due treatment: Yes . MD and/or nursing notified: Yes  Philomena Course 06/28/2019 11:11 AM

## 2019-07-04 ENCOUNTER — Encounter: Payer: Self-pay | Admitting: *Deleted

## 2019-07-04 ENCOUNTER — Encounter: Payer: Self-pay | Admitting: Adult Health

## 2019-07-04 ENCOUNTER — Other Ambulatory Visit: Payer: Self-pay

## 2019-07-04 ENCOUNTER — Inpatient Hospital Stay: Payer: BC Managed Care – PPO | Attending: Oncology

## 2019-07-04 ENCOUNTER — Inpatient Hospital Stay: Payer: BC Managed Care – PPO

## 2019-07-04 ENCOUNTER — Inpatient Hospital Stay (HOSPITAL_BASED_OUTPATIENT_CLINIC_OR_DEPARTMENT_OTHER): Payer: BC Managed Care – PPO | Admitting: Adult Health

## 2019-07-04 VITALS — BP 152/79 | HR 77 | Temp 98.2°F | Ht 64.0 in | Wt 310.0 lb

## 2019-07-04 DIAGNOSIS — Z170 Estrogen receptor positive status [ER+]: Secondary | ICD-10-CM

## 2019-07-04 DIAGNOSIS — Z452 Encounter for adjustment and management of vascular access device: Secondary | ICD-10-CM | POA: Insufficient documentation

## 2019-07-04 DIAGNOSIS — C50812 Malignant neoplasm of overlapping sites of left female breast: Secondary | ICD-10-CM | POA: Insufficient documentation

## 2019-07-04 DIAGNOSIS — Z5111 Encounter for antineoplastic chemotherapy: Secondary | ICD-10-CM | POA: Diagnosis present

## 2019-07-04 DIAGNOSIS — Z5189 Encounter for other specified aftercare: Secondary | ICD-10-CM | POA: Diagnosis not present

## 2019-07-04 DIAGNOSIS — Z90710 Acquired absence of both cervix and uterus: Secondary | ICD-10-CM

## 2019-07-04 DIAGNOSIS — C50412 Malignant neoplasm of upper-outer quadrant of left female breast: Secondary | ICD-10-CM

## 2019-07-04 DIAGNOSIS — Z1501 Genetic susceptibility to malignant neoplasm of breast: Secondary | ICD-10-CM

## 2019-07-04 DIAGNOSIS — Z171 Estrogen receptor negative status [ER-]: Secondary | ICD-10-CM | POA: Insufficient documentation

## 2019-07-04 DIAGNOSIS — Z95828 Presence of other vascular implants and grafts: Secondary | ICD-10-CM

## 2019-07-04 LAB — CBC WITH DIFFERENTIAL/PLATELET
Abs Immature Granulocytes: 0.13 10*3/uL — ABNORMAL HIGH (ref 0.00–0.07)
Basophils Absolute: 0 10*3/uL (ref 0.0–0.1)
Basophils Relative: 0 %
Eosinophils Absolute: 0 10*3/uL (ref 0.0–0.5)
Eosinophils Relative: 0 %
HCT: 34.1 % — ABNORMAL LOW (ref 36.0–46.0)
Hemoglobin: 10.9 g/dL — ABNORMAL LOW (ref 12.0–15.0)
Immature Granulocytes: 1 %
Lymphocytes Relative: 10 %
Lymphs Abs: 0.9 10*3/uL (ref 0.7–4.0)
MCH: 27 pg (ref 26.0–34.0)
MCHC: 32 g/dL (ref 30.0–36.0)
MCV: 84.6 fL (ref 80.0–100.0)
Monocytes Absolute: 0.3 10*3/uL (ref 0.1–1.0)
Monocytes Relative: 3 %
Neutro Abs: 7.9 10*3/uL — ABNORMAL HIGH (ref 1.7–7.7)
Neutrophils Relative %: 86 %
Platelets: 235 10*3/uL (ref 150–400)
RBC: 4.03 MIL/uL (ref 3.87–5.11)
RDW: 16.5 % — ABNORMAL HIGH (ref 11.5–15.5)
WBC: 9.2 10*3/uL (ref 4.0–10.5)
nRBC: 0 % (ref 0.0–0.2)

## 2019-07-04 LAB — COMPREHENSIVE METABOLIC PANEL
ALT: 18 U/L (ref 0–44)
AST: 15 U/L (ref 15–41)
Albumin: 3.7 g/dL (ref 3.5–5.0)
Alkaline Phosphatase: 64 U/L (ref 38–126)
Anion gap: 9 (ref 5–15)
BUN: 11 mg/dL (ref 6–20)
CO2: 29 mmol/L (ref 22–32)
Calcium: 9.6 mg/dL (ref 8.9–10.3)
Chloride: 103 mmol/L (ref 98–111)
Creatinine, Ser: 0.73 mg/dL (ref 0.44–1.00)
GFR calc Af Amer: >60 mL/min (ref >60)
GFR calc non Af Amer: >60 mL/min (ref >60)
Glucose, Bld: 123 mg/dL — ABNORMAL HIGH (ref 70–99)
Potassium: 3.4 mmol/L — ABNORMAL LOW (ref 3.5–5.1)
Sodium: 141 mmol/L (ref 135–145)
Total Bilirubin: 0.5 mg/dL (ref 0.3–1.2)
Total Protein: 7.2 g/dL (ref 6.5–8.1)

## 2019-07-04 MED ORDER — SODIUM CHLORIDE 0.9 % IV SOLN
Freq: Once | INTRAVENOUS | Status: AC
  Filled 2019-07-04: qty 250

## 2019-07-04 MED ORDER — SODIUM CHLORIDE 0.9 % IV SOLN
150.0000 mg | Freq: Once | INTRAVENOUS | Status: AC
  Administered 2019-07-04: 150 mg via INTRAVENOUS
  Filled 2019-07-04: qty 150

## 2019-07-04 MED ORDER — PALONOSETRON HCL INJECTION 0.25 MG/5ML
0.2500 mg | Freq: Once | INTRAVENOUS | Status: AC
  Administered 2019-07-04: 0.25 mg via INTRAVENOUS

## 2019-07-04 MED ORDER — SODIUM CHLORIDE 0.9% FLUSH
10.0000 mL | INTRAVENOUS | Status: DC | PRN
  Administered 2019-07-04: 10 mL
  Filled 2019-07-04: qty 10

## 2019-07-04 MED ORDER — SODIUM CHLORIDE 0.9 % IV SOLN
600.0000 mg/m2 | Freq: Once | INTRAVENOUS | Status: AC
  Administered 2019-07-04: 1500 mg via INTRAVENOUS
  Filled 2019-07-04: qty 75

## 2019-07-04 MED ORDER — SODIUM CHLORIDE 0.9 % IV SOLN
10.0000 mg | Freq: Once | INTRAVENOUS | Status: AC
  Administered 2019-07-04: 10 mg via INTRAVENOUS
  Filled 2019-07-04: qty 10

## 2019-07-04 MED ORDER — PALONOSETRON HCL INJECTION 0.25 MG/5ML
INTRAVENOUS | Status: AC
  Filled 2019-07-04: qty 5

## 2019-07-04 MED ORDER — HEPARIN SOD (PORK) LOCK FLUSH 100 UNIT/ML IV SOLN
500.0000 [IU] | Freq: Once | INTRAVENOUS | Status: AC | PRN
  Administered 2019-07-04: 500 [IU]
  Filled 2019-07-04: qty 5

## 2019-07-04 MED ORDER — SODIUM CHLORIDE 0.9% FLUSH
10.0000 mL | INTRAVENOUS | Status: DC | PRN
  Administered 2019-07-04: 10 mL via INTRAVENOUS
  Filled 2019-07-04: qty 10

## 2019-07-04 MED ORDER — SODIUM CHLORIDE 0.9 % IV SOLN
75.0000 mg/m2 | Freq: Once | INTRAVENOUS | Status: AC
  Administered 2019-07-04: 190 mg via INTRAVENOUS
  Filled 2019-07-04: qty 19

## 2019-07-04 NOTE — Progress Notes (Signed)
Patient's insurance not allowing Onpro and requiring a preferred pegfilgrastim be use. Orders upated to fulphila and scheduling message sent for 5/8 and 5/29 injection appts. Dr. Jana Hakim aware of change.   Demetrius Charity, PharmD, BCPS, [facility] Pharmacist Pharmacy Phone: [phone] 07/04/2019

## 2019-07-04 NOTE — Patient Instructions (Signed)
[facility] Discharge Instructions for Patients Receiving Chemotherapy  Today you received the following chemotherapy agents taxotere and cytoxan  To help prevent nausea and vomiting after your treatment, we encourage you to take your nausea medication as directed   If you develop nausea and vomiting that is not controlled by your nausea medication, call the clinic.   BELOW ARE SYMPTOMS THAT SHOULD BE REPORTED IMMEDIATELY:  *FEVER GREATER THAN 100.5 F  *CHILLS WITH OR WITHOUT FEVER  NAUSEA AND VOMITING THAT IS NOT CONTROLLED WITH YOUR NAUSEA MEDICATION  *UNUSUAL SHORTNESS OF BREATH  *UNUSUAL BRUISING OR BLEEDING  TENDERNESS IN MOUTH AND THROAT WITH OR WITHOUT PRESENCE OF ULCERS  *URINARY PROBLEMS  *BOWEL PROBLEMS  UNUSUAL RASH Items with * indicate a potential emergency and should be followed up as soon as possible.  Feel free to call the clinic should you have any questions or concerns. The clinic phone number is [phone].  Please show the [facility] at check-in to the Emergency Department and triage nurse.

## 2019-07-04 NOTE — Progress Notes (Signed)
[place]  Telephone:([phone] [phone]     ID: Loretta Nelson DOB: April 17, 1965  MR#: [id number]  FTD#:[id number]  Patient Care Team: [name], MD as PCP - General (Family Medicine) Mauro Kaufmann, RN as Oncology Nurse Navigator Rockwell Germany, RN as Oncology Nurse Navigator Donnie Mesa, MD as Consulting Physician (General Surgery) Magrinat, Virgie Dad, MD as Consulting Physician (Oncology) Kyung Rudd, MD as Consulting Physician (Radiation Oncology) Delrae Rend, MD as Consulting Physician (Endocrinology) Star Age, MD as Consulting Physician (Neurology) Garvin Fila, MD as Consulting Physician (Neurology) Christophe Louis, MD as Consulting Physician (Obstetrics and Gynecology) Arta Silence, MD as Consulting Physician (Gastroenterology) Scot Dock, NP OTHER MD:  CHIEF COMPLAINT: triple negative breast cancer  CURRENT TREATMENT: Adjuvant chemotherapy   INTERVAL HISTORY: Schyler returns today for follow up of her triple negative breast cancer unaccompanied..   She began her adjuvant chemotherapy consisting of cyclophosphamide and docetaxel on 05/23/2019. The plan is for her to receive this every 21 days for 4 cycles.  Today is cycle 3 day 1 of treatment.  She receives neulasta support with Fulphilia on day 3 of treatment.  She was saddened to hear that her insurance company would not approve the Onpro because it really is burdensome for her to travel here 2 days after.    REVIEW OF SYSTEMS: Jaquia is feeling better today than she was a couple of weeks ago.  She is happy to be almost completed with her treatment and only having two more cycles of therapy.  She denies any fever, chills, chest pain, palpitations, cough, shortness of breath, nausea, vomiting, bowel/bladder changes.  A detailed ROS was otherwise non contributory.    HISTORY OF CURRENT ILLNESS: From the original intake note:  Loretta Nelson had routine screening  mammography on 03/19/2019 showing a possible abnormality in the left breast. She underwent left diagnostic mammography with tomography and left breast ultrasonography at [facility] on 03/29/2019 showing: breast density category B; 8 mm mass in left breast at 12 o'clock; left axilla negative for lymphadenopathy.  Accordingly on 04/03/2019 she proceeded to biopsy of the left breast area in question. The pathology from this procedure ([id number]) showed: invasive mammary carcinoma, grade 3, e-cadherin positive. Prognostic indicators significant for: estrogen receptor, 10% positive with weak staining intensity and progesterone receptor, 0% negative. Proliferation marker Ki67 at 30%. HER2 negative by immunohistochemistry (1+).  The patient's subsequent history is as detailed below.   PAST MEDICAL HISTORY: Past Medical History:  Diagnosis Date  . Allergic rhinitis   . Anemia    low iron  . Anemia   . Anxiety   . Anxiety   . Arthritis    right knee, surgery done  . Cancer ([place]) 2021   left breast   . Complication of anesthesia    woke up during EGD  . Esophageal stricture   . Family history of breast cancer   . GERD (gastroesophageal reflux disease)   . Graves disease 01/13/11   radioactive iodine treatment, 15.1 millicuries  . Headache    tension headaches  . Hiatal hernia    small  . Hypertension   . Hyperthyroidism   . Hypothyroidism    s/p treatment  . Murmur    benign, h/o, caused by hyperdynamic contraction  . Obesity   . Pneumonia 2017   inhaler prescribed  . Sleep apnea 2021   no CPAP as yet, last study done lastnight 04/15/2019  . Tendonitis of shoulder, right   .  Vitamin D deficiency   . Vitamin D deficiency     PAST SURGICAL HISTORY: Past Surgical History:  Procedure Laterality Date  . BALLOON DILATION N/A 09/07/2016   Procedure: BALLOON DILATION;  Surgeon: Arta Silence, MD;  Location[facility] ENDOSCOPY;  Service: Endoscopy;  Laterality: N/A;  . BREAST  LUMPECTOMY WITH RADIOACTIVE SEED AND SENTINEL LYMPH NODE BIOPSY Left 04/17/2019   Procedure: LEFT BREAST LUMPECTOMY WITH RADIOACTIVE SEED AND SENTINEL LYMPH NODE BIOPSY;  Surgeon: Donnie Mesa, MD;  Location: [facility];  Service: General;  Laterality: Left;  LMA, PEC BLOCK  . CESAREAN SECTION  1999  . COLONOSCOPY    . ESOPHAGOGASTRODUODENOSCOPY    . ESOPHAGOGASTRODUODENOSCOPY (EGD) WITH PROPOFOL N/A 09/07/2016   Procedure: ESOPHAGOGASTRODUODENOSCOPY (EGD) WITH PROPOFOL;  Surgeon: Arta Silence, MD;  Location: [place];  Service: Endoscopy;  Laterality: N/A;  . HYSTERECTOMY ABDOMINAL WITH SALPINGECTOMY Bilateral 02/17/2017   Procedure: HYSTERECTOMY ABDOMINAL WITH SALPINGECTOMY;  Surgeon: Christophe Louis, MD;  Location: [facility];  Service: Gynecology;  Laterality: Bilateral;  . KNEE ARTHROSCOPY     for torn meniscus  . PORTACATH PLACEMENT Right 05/22/2019   Procedure: INSERTION PORT-A-CATH WITH ULTRASOUND GUIDANCE;  Surgeon: Donnie Mesa, MD;  Location: [facility];  Service: General;  Laterality: Right;  Endotrachial tube  . WISDOM TOOTH EXTRACTION    . WISDOM TOOTH EXTRACTION      FAMILY HISTORY: Family History  Problem Relation Age of Onset  . Hypertension Mother   . CAD Maternal Grandfather   . Breast cancer Paternal Grandmother   . HIV Brother   . Heart attack Brother        sudden MI vs PE  . Breast cancer Sister 50  . Breast cancer Other        one dx 76s, others dx in 65s  . Colon polyps Neg Hx   . Colon cancer Neg Hx   . Liver disease Neg Hx    Patient's father was in his late 89s when he died from causes unknown to the patient.  Patient's mother is 79 years old (as of 04/2019). The patient denies a family hx of ovarian cancer. She reports breast cancer in her sister at age 61, in her paternal grandmother, and in 3 paternal cousins. She had 4 siblings, 2 brothers and 2 sisters, though only her sisters are still living.   GYNECOLOGIC HISTORY:  Patient's last menstrual period was  02/13/2017 (exact date). Menarche: 54 years old Age at first live birth: 54 years old [place] P 2 (twins) LMP 01/2017 Contraceptive: used from [id number] without complication HRT never used  Hysterectomy? Yes, 01/2017, benign pathology BSO? 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 She is divorced. She lives at home with her twin daughters, Maryjo Rochester and Edmon Crape, who are 54 years old fraternal twins and attending college remotely. Maryjo Rochester is studying music and communications at [employer].Edmon Crape is studying IT.    ADVANCED DIRECTIVES: Not in place; the patient intends to name her sister Madelon Lips as her HCPOA [phone])   HEALTH MAINTENANCE: Social History   Tobacco Use  . Smoking status: Never Smoker  . Smokeless tobacco: Never Used  Substance Use Topics  . Alcohol use: No    Alcohol/week: 0.0 standard drinks  . Drug use: No     Colonoscopy: at age 16  PAP: 02/2018  Bone density: never done   No Known Allergies  Current Outpatient Medications  Medication Sig Dispense Refill  . amLODipine (NORVASC) 5 MG tablet Take 5 mg by mouth daily.   5  . Cholecalciferol 4000 UNITS CAPS Take 4,000 Units by mouth daily.     . citalopram (CELEXA) 40 MG tablet Take 40 mg by mouth daily.    Marland Kitchen dexamethasone (DECADRON) 4 MG tablet Take 2 tablets (8 mg total) by mouth 2 (two) times daily. Start the day before Taxotere. Then again the day after chemo for 3 days. 30 tablet 1  . EDARBYCLOR 40-25 MG TABS Take 1 tablet by mouth daily.  5  . ibuprofen (ADVIL) 200 MG tablet Take 400 mg by mouth every 6 (six) hours as needed for moderate pain.    Marland Kitchen KLOR-CON M20 20 MEQ tablet Take 20 mEq by mouth 2 (two) times daily.   1  . L-Methylfolate-B12-B6-B2 (CEREFOLIN) 07-29-48-5 MG TABS Take 1 tablet by mouth every morning. 90 tablet 3  . levothyroxine (SYNTHROID) 175 MCG tablet Take 175 mcg by  mouth daily before breakfast.   5  . lidocaine-prilocaine (EMLA) cream Apply to affected area once (Patient taking differently: Apply 1 application topically daily as needed (port access). ) 30 g 3  . loratadine (CLARITIN) 10 MG tablet TAKE 1 TABLET BY MOUTH EVERY DAY (Patient taking differently: Take 10 mg by mouth daily. ) 90 tablet 1  . MELATONIN PO Take 1 tablet by mouth at bedtime as needed (sleep).    . metoprolol (TOPROL-XL) 200 MG 24 hr tablet Take 200 mg by mouth daily.   5  . omeprazole (PRILOSEC) 40 MG capsule Take 40 mg by mouth See admin instructions. Take 40 mg daily, may take a second 40 mg dose as needed for acid reflux    . ondansetron (ZOFRAN) 8 MG tablet Take 1 tablet (8 mg total) by mouth every 8 (eight) hours as needed for nausea or vomiting. Start on day 3 after chemotherapy 30 tablet 1  . ciprofloxacin (CIPRO) 500 MG tablet Take 1 tablet (500 mg total) by mouth 2 (two) times daily. Take for 5 days starting one week after a chemotherapy dose (Patient not taking: Reported on 07/04/2019) 40 tablet 0  . LORazepam (ATIVAN) 0.5 MG tablet Take 1 tablet (0.5 mg total) by mouth every 6 (six) hours as needed (Nausea or vomiting). (Patient not taking: Reported on 07/04/2019) 30 tablet 0  . prochlorperazine (COMPAZINE) 10 MG tablet Take 1 tablet (10 mg total) by mouth every 6 (six) hours as needed (Nausea or vomiting). (Patient not taking: Reported on 07/04/2019) 30 tablet 1  . spironolactone (ALDACTONE) 50 MG tablet Take 50 mg by mouth daily.     No current facility-administered medications for this visit.    OBJECTIVE: African-American woman in no acute distress  Vitals:   07/04/19 1127  BP: (!) 152/79  Pulse: 77  Temp: 98.2 F (36.8 C)  SpO2: 98%     Body mass index is 53.21 kg/m.   Wt Readings from Last 3 Encounters:  07/04/19 (!) 310 lb (140.6 kg)  06/20/19 (!) 305 lb 3.2 oz (138.4 kg)  06/13/19 (!) 310 lb 3.2 oz (140.7 kg)      ECOG FS:1 - Symptomatic but completely  ambulatory  GENERAL: Patient is a well appearing female in no acute distress HEENT:  Sclerae anicteric.  Mask in place. Neck is supple.  NODES:  No cervical, supraclavicular, or axillary lymphadenopathy palpated.  BREAST EXAM:  Deferred. LUNGS:  Clear to auscultation bilaterally.  No wheezes or rhonchi. HEART:  Regular rate and rhythm. No murmur appreciated. ABDOMEN:  Soft, nontender.  Positive, normoactive bowel sounds. No organomegaly palpated. MSK:  No focal spinal tenderness to palpation. Full range of motion bilaterally in the upper extremities. EXTREMITIES:  No peripheral edema.   SKIN:  Clear with no obvious rashes or skin changes. No nail dyscrasia. NEURO:  Nonfocal. Well oriented.  Appropriate affect.    LAB RESULTS:  CMP     Component Value Date/Time   NA 140 06/20/2019 1434   K 3.6 06/20/2019 1434   CL 99 06/20/2019 1434   CO2 33 (H) 06/20/2019 1434   GLUCOSE 95 06/20/2019 1434   BUN 14 06/20/2019 1434   CREATININE 1.02 (H) 06/20/2019 1434   CREATININE 0.83 04/10/2019 1240   CALCIUM 9.1 06/20/2019 1434   PROT 6.3 (L) 06/20/2019 1434   ALBUMIN 3.2 (L) 06/20/2019 1434   AST 21 06/20/2019 1434   AST 17 04/10/2019 1240   ALT 24 06/20/2019 1434   ALT 19 04/10/2019 1240   ALKPHOS 82 06/20/2019 1434   BILITOT 0.5 06/20/2019 1434   BILITOT 0.4 04/10/2019 1240   GFRNONAA >60 06/20/2019 1434   GFRNONAA >60 04/10/2019 1240   GFRAA >60 06/20/2019 1434   GFRAA >60 04/10/2019 1240    No results found for: TOTALPROTELP, ALBUMINELP, A1GS, A2GS, BETS, BETA2SER, GAMS, MSPIKE, SPEI  Lab Results  Component Value Date   WBC 9.2 07/04/2019   NEUTROABS 7.9 (H) 07/04/2019   HGB 10.9 (L) 07/04/2019   HCT 34.1 (L) 07/04/2019   MCV 84.6 07/04/2019   PLT 235 07/04/2019    No results found for: LABCA2  No components found for: MLJQGB201  No results for input(s): INR in the last 168 hours.  No results found for: LABCA2  No results found for: EOF121  No results found  for: FXJ883  No results found for: GPQ982  No results found for: CA2729  No components found for: HGQUANT  No results found for: CEA1 / No results found for: CEA1   No results found for: AFPTUMOR  No results found for: CHROMOGRNA  No results found for: KPAFRELGTCHN, LAMBDASER, KAPLAMBRATIO (kappa/lambda light chains)  No results found for: HGBA, HGBA2QUANT, HGBFQUANT, HGBSQUAN (Hemoglobinopathy evaluation)   No results found for: LDH  No results found for: IRON, TIBC, IRONPCTSAT (Iron and TIBC)  No results found for: FERRITIN  Urinalysis No results found for: COLORURINE, APPEARANCEUR, LABSPEC, PHURINE, GLUCOSEU, HGBUR, BILIRUBINUR, KETONESUR, PROTEINUR, UROBILINOGEN, NITRITE, LEUKOCYTESUR   STUDIES: No results found.   ELIGIBLE FOR AVAILABLE RESEARCH PROTOCOL:BR003?--Depending on final surgical pathology  ASSESSMENT: 54 y.o. [place] woman status post left breast upper outer quadrant biopsy 04/03/2019 for a clinical T1b N0, stage IB invasive ductal carcinoma, grade 3, functionally triple negative (estrogen receptor is weakly positive at 10%, progesterone receptor negative, HER-2 not amplified), with an MIB-1 of 30%  (1) status post left lumpectomy and sentinel lymph node sampling 04/17/2019 for a pT1c pN0, stage IB invasive ductal carcinoma, grade 3, triple negative, with negative margins  (a) a total of 2 sentinel lymph nodes were removed  (2) adjuvant chemotherapy consisting of cyclophosphamide and docetaxel every 21 days x 4 to start 05/23/2019  (3) adjuvant radiation to follow  (4) genetics testing 04/19/2019 through the [facility] Breast Cancer STAT Panel + Common Hereditary Cancers Panel 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 STK11, TP53, TSC1, TSC2, and VHL.  The following genes were evaluated for sequence changes only: SDHA and HOXB13 c.251G>A variant only.  (b) current data suggests an increase risk of breast and ovarian cancer in patients carrying a BARD1 mutation, but data is preliminary and insufficient for definitive recommendations   PLAN: Jesicca is doing well today.  Her CBC thus far is normal and I reviewed those with her today.  She has no peripheral neuropathy.  She will proceed with treatment with her third cycle of Docetaxel and Cyclophosphamide and her growth factor support with fulphila.  Burman Nieves is going to talk to her today in chemo.    [place] and I talked about her diet and activity level.  She will return in one week for labs and f/u with Dr. Jana Hakim.  She was recommended to continue with the appropriate pandemic precautions. She knows to call for any questions that may arise between now and her next appointment.  We are happy to see her sooner if needed.   Total encounter time: 30 minutes*  Wilber Bihari, NP 07/04/19 11:55 AM Medical Oncology and Hematology [facility] [place], [place] [address] Tel. [phone]    Fax. [phone]    *Total Encounter Time as defined by the Centers for Medicare and Medicaid Services includes, in addition to the face-to-face time of a patient visit (documented in the note above) non-face-to-face time: obtaining and reviewing outside history, ordering and reviewing medications, tests or procedures, care coordination (communications with other health care professionals or caregivers) and  documentation in the medical record.

## 2019-07-05 ENCOUNTER — Telehealth: Payer: Self-pay | Admitting: Adult Health

## 2019-07-05 NOTE — Telephone Encounter (Signed)
No 5/6 los. No changes made to pt's schedule.  

## 2019-07-06 ENCOUNTER — Other Ambulatory Visit: Payer: Self-pay

## 2019-07-06 ENCOUNTER — Inpatient Hospital Stay: Payer: BC Managed Care – PPO

## 2019-07-06 VITALS — BP 166/77 | HR 67 | Temp 98.7°F | Resp 22

## 2019-07-06 DIAGNOSIS — Z170 Estrogen receptor positive status [ER+]: Secondary | ICD-10-CM

## 2019-07-06 DIAGNOSIS — C50412 Malignant neoplasm of upper-outer quadrant of left female breast: Secondary | ICD-10-CM

## 2019-07-06 DIAGNOSIS — Z5111 Encounter for antineoplastic chemotherapy: Secondary | ICD-10-CM | POA: Diagnosis not present

## 2019-07-06 MED ORDER — PEGFILGRASTIM-JMDB 6 MG/0.6ML ~~LOC~~ SOSY
6.0000 mg | PREFILLED_SYRINGE | Freq: Once | SUBCUTANEOUS | Status: AC
  Administered 2019-07-06: 6 mg via SUBCUTANEOUS

## 2019-07-08 ENCOUNTER — Encounter: Payer: Self-pay | Admitting: *Deleted

## 2019-07-10 NOTE — Progress Notes (Signed)
[place]  [phone] [phone]     ID: Loretta Nelson DOB: [birth year]  MR#: [id number]  FGH#:[id number]  Patient Care Team: [name], MD as PCP - General (Family Medicine) Mauro Kaufmann, RN as Oncology Nurse Navigator Rockwell Germany, RN as Oncology Nurse Navigator Donnie Mesa, MD as Consulting Physician (General Surgery) Jaiyana Canale, Virgie Dad, MD as Consulting Physician (Oncology) Kyung Rudd, MD as Consulting Physician (Radiation Oncology) Delrae Rend, MD as Consulting Physician (Endocrinology) Star Age, MD as Consulting Physician (Neurology) Garvin Fila, MD as Consulting Physician (Neurology) Christophe Louis, MD as Consulting Physician (Obstetrics and Gynecology) Arta Silence, MD as Consulting Physician (Gastroenterology) Chauncey Cruel, MD OTHER MD:  CHIEF COMPLAINT: triple negative breast cancer  CURRENT TREATMENT: Adjuvant chemotherapy   INTERVAL HISTORY: Loretta Nelson returns today for follow up and treatment of her triple negative breast cancer.  She is accompanied by her daughter Loretta Nelson.  She began her adjuvant chemotherapy consisting of cyclophosphamide and docetaxel on 05/23/2019. The plan is for her to receive this every 21 days for 4 cycles.  Today is cycle 3 day 8 of treatment.  She receives neulasta support with Fulphilia on day 3 of treatment.     REVIEW OF SYSTEMS: Loretta Nelson tells me she did much better with this cycle.  She took the nausea medicines faithfully, had some nausea but no vomiting, and was able to hydrate herself appropriately.  She did not have problems with fever rash constipation or diarrhea.  She is not exercising regularly although she does take little walks around the house.  The good news is that both her daughters are graduating this week.  HISTORY OF CURRENT ILLNESS: From the original intake note:  Blayne Garlick had routine screening mammography on 03/19/2019 showing a possible  abnormality in the left breast. She underwent left diagnostic mammography with tomography and left breast ultrasonography at [facility] on 03/29/2019 showing: breast density category B; 8 mm mass in left breast at 12 o'clock; left axilla negative for lymphadenopathy.  Accordingly on 04/03/2019 she proceeded to biopsy of the left breast area in question. The pathology from this procedure ([id number]) showed: invasive mammary carcinoma, grade 3, e-cadherin positive. Prognostic indicators significant for: estrogen receptor, 10% positive with weak staining intensity and progesterone receptor, 0% negative. Proliferation marker Ki67 at 30%. HER2 negative by immunohistochemistry (1+).  The patient's subsequent history is as detailed below.   PAST MEDICAL HISTORY: Past Medical History:  Diagnosis Date   Allergic rhinitis    Anemia    low iron   Anemia    Anxiety    Anxiety    Arthritis    right knee, surgery done   Cancer [facility]) 2021   left breast    Complication of anesthesia    woke up during EGD   Esophageal stricture    Family history of breast cancer    GERD (gastroesophageal reflux disease)    Graves disease 01/13/11   radioactive iodine treatment, 75.1 millicuries   Headache    tension headaches   Hiatal hernia    small   Hypertension    Hyperthyroidism    Hypothyroidism    s/p treatment   Murmur    benign, h/o, caused by hyperdynamic contraction   Obesity    Pneumonia 2017   inhaler prescribed   Sleep apnea 2021   no CPAP as yet, last study done lastnight 04/15/2019   Tendonitis of shoulder, right    Vitamin D deficiency  Vitamin D deficiency     PAST SURGICAL HISTORY: Past Surgical History:  Procedure Laterality Date   BALLOON DILATION N/A 09/07/2016   Procedure: BALLOON DILATION;  Surgeon: Arta Silence, MD;  Location: [facility] END[facility];  Service: Endoscopy;  Laterality: N/A;   BREAST LUMPECTOMY WITH RADIOACTIVE SEED AND SENTINEL LYMPH  NODE BIOPSY Left 04/17/2019   Procedure: LEFT BREAST LUMPECTOMY WITH RADIOACTIVE SEED AND SENTINEL LYMPH NODE BIOPSY;  Surgeon: Donnie Mesa, MD;  Location: [place];  Service: General;  Laterality: Left;  LMA, PEC BLOCK   CESAREAN SECTION  1999   COLONOSCOPY     ESOPHAGOGASTRODUODENOSCOPY     ESOPHAGOGASTRODUODENOSCOPY (EGD) WITH PROPOFOL N/A 09/07/2016   Procedure: ESOPHAGOGASTRODUODENOSCOPY (EGD) WITH PROPOFOL;  Surgeon: Arta Silence, MD;  Location: [place];  Service: Endoscopy;  Laterality: N/A;   HYSTERECTOMY ABDOMINAL WITH SALPINGECTOMY Bilateral 02/17/2017   Procedure: HYSTERECTOMY ABDOMINAL WITH SALPINGECTOMY;  Surgeon: Christophe Louis, MD;  Location: [facility];  Service: Gynecology;  Laterality: Bilateral;   KNEE ARTHROSCOPY     for torn meniscus   PORTACATH PLACEMENT Right 05/22/2019   Procedure: INSERTION PORT-A-CATH WITH ULTRASOUND GUIDANCE;  Surgeon: Donnie Mesa, MD;  Location: [facility];  Service: General;  Laterality: Right;  Endotrachial tube   WISDOM TOOTH EXTRACTION     WISDOM TOOTH EXTRACTION      FAMILY HISTORY: Family History  Problem Relation Age of Onset   Hypertension Mother    CAD Maternal Grandfather    Breast cancer Paternal Grandmother    HIV Brother    Heart attack Brother        sudden MI vs PE   Breast cancer Sister 18   Breast cancer Other        one dx 74s, others dx in 40s   Colon polyps Neg Hx    Colon cancer Neg Hx    Liver disease Neg Hx    Patient's father was in his late 49s when he died from causes unknown to the patient.  Patient's mother is 71 years old (as of 04/2019). The patient denies a family hx of ovarian cancer. She reports breast cancer in her sister at age 68, in her paternal grandmother, and in 3 paternal cousins. She had 4 siblings, 2 brothers and 2 sisters, though only her sisters are still living.   GYNECOLOGIC HISTORY:  Patient's last menstrual period was 02/13/2017 (exact date). Menarche: 54 years old Age at  first live birth: 54 years old [place] P 2 (twins) LMP 01/2017 Contraceptive: used from [id number] without complication HRT never used  Hysterectomy? Yes, 01/2017, benign pathology BSO? 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 She is divorced. She lives at home with her twin daughters, Loretta Nelson and Loretta Nelson, who are 54 years old fraternal twins and attending college remotely. Loretta Nelson is studying music and communications at [employer].Loretta Nelson is studying IT.    ADVANCED DIRECTIVES: Not in place; the patient intends to name her sister Loretta Nelson as her 71 [phone])   HEALTH MAINTENANCE: Social History   Tobacco Use   Smoking status: Never Smoker   Smokeless tobacco: Never Used  Substance Use Topics   Alcohol use: No    Alcohol/week: 0.0 standard drinks   Drug use: No     Colonoscopy: at age 60  PAP: 02/2018  Bone density: never done   No Known Allergies  Current Outpatient Medications  Medication Sig Dispense Refill  amLODipine (NORVASC) 5 MG tablet Take 5 mg by mouth daily.   5   Cholecalciferol 4000 UNITS CAPS Take 4,000 Units by mouth daily.      ciprofloxacin (CIPRO) 500 MG tablet Take 1 tablet (500 mg total) by mouth 2 (two) times daily. Take for 5 days starting one week after a chemotherapy dose (Patient not taking: Reported on 07/04/2019) 40 tablet 0   citalopram (CELEXA) 40 MG tablet Take 40 mg by mouth daily.     dexamethasone (DECADRON) 4 MG tablet Take 2 tablets (8 mg total) by mouth 2 (two) times daily. Start the day before Taxotere. Then again the day after chemo for 3 days. 30 tablet 1   EDARBYCLOR 40-25 MG TABS Take 1 tablet by mouth daily.  5   ibuprofen (ADVIL) 200 MG tablet Take 400 mg by mouth every 6 (six) hours as needed for moderate pain.     KLOR-CON M20 20 MEQ tablet Take 20 mEq by mouth 2 (two) times daily.   1    L-Methylfolate-B12-B6-B2 (CEREFOLIN) 07-29-48-5 MG TABS Take 1 tablet by mouth every morning. 90 tablet 3   levothyroxine (SYNTHROID) 175 MCG tablet Take 175 mcg by mouth daily before breakfast.   5   lidocaine-prilocaine (EMLA) cream Apply to affected area once (Patient taking differently: Apply 1 application topically daily as needed (port access). ) 30 g 3   loratadine (CLARITIN) 10 MG tablet TAKE 1 TABLET BY MOUTH EVERY DAY (Patient taking differently: Take 10 mg by mouth daily. ) 90 tablet 1   LORazepam (ATIVAN) 0.5 MG tablet Take 1 tablet (0.5 mg total) by mouth every 6 (six) hours as needed (Nausea or vomiting). (Patient not taking: Reported on 07/04/2019) 30 tablet 0   MELATONIN PO Take 1 tablet by mouth at bedtime as needed (sleep).     metoprolol (TOPROL-XL) 200 MG 24 hr tablet Take 200 mg by mouth daily.   5   omeprazole (PRILOSEC) 40 MG capsule Take 40 mg by mouth See admin instructions. Take 40 mg daily, may take a second 40 mg dose as needed for acid reflux     ondansetron (ZOFRAN) 8 MG tablet Take 1 tablet (8 mg total) by mouth every 8 (eight) hours as needed for nausea or vomiting. Start on day 3 after chemotherapy 30 tablet 1   prochlorperazine (COMPAZINE) 10 MG tablet Take 1 tablet (10 mg total) by mouth every 6 (six) hours as needed (Nausea or vomiting). (Patient not taking: Reported on 07/04/2019) 30 tablet 1   spironolactone (ALDACTONE) 50 MG tablet Take 50 mg by mouth daily.     No current facility-administered medications for this visit.    OBJECTIVE: African-American woman who appears younger than stated age  71:   07/11/19 1112  BP: (!) 161/87  Pulse: 99  Resp: 20  Temp: 99.1 F (37.3 C)  SpO2: 96%     Body mass index is 52.58 kg/m.   Wt Readings from Last 3 Encounters:  07/11/19 (!) 306 lb 4.8 oz (138.9 kg)  07/04/19 (!) 310 lb (140.6 kg)  06/20/19 (!) 305 lb 3.2 oz (138.4 kg)      ECOG FS:1 - Symptomatic but completely ambulatory  Sclerae  unicteric, EOMs intact Wearing a mask No cervical or supraclavicular adenopathy Lungs no rales or rhonchi Heart regular rate and rhythm Abd soft, obese, nontender, positive bowel sounds MSK no focal spinal tenderness, no upper extremity lymphedema Neuro: nonfocal, well oriented, appropriate affect Breasts: Deferred   LAB RESULTS:  CMP     Component Value Date/Time   NA 141 07/04/2019 1115   K 3.4 (L) 07/04/2019 1115   CL 103 07/04/2019 1115   CO2 29 07/04/2019 1115   GLUCOSE 123 (H) 07/04/2019 1115   BUN 11 07/04/2019 1115   CREATININE 0.73 07/04/2019 1115   CREATININE 0.83 04/10/2019 1240   CALCIUM 9.6 07/04/2019 1115   PROT 7.2 07/04/2019 1115   ALBUMIN 3.7 07/04/2019 1115   AST 15 07/04/2019 1115   AST 17 04/10/2019 1240   ALT 18 07/04/2019 1115   ALT 19 04/10/2019 1240   ALKPHOS 64 07/04/2019 1115   BILITOT 0.5 07/04/2019 1115   BILITOT 0.4 04/10/2019 1240   GFRNONAA >60 07/04/2019 1115   GFRNONAA >60 04/10/2019 1240   GFRAA >60 07/04/2019 1115   GFRAA >60 04/10/2019 1240    No results found for: TOTALPROTELP, ALBUMINELP, A1GS, A2GS, BETS, BETA2SER, GAMS, MSPIKE, SPEI  Lab Results  Component Value Date   WBC 4.3 07/11/2019   NEUTROABS PENDING 07/11/2019   HGB 10.7 (L) 07/11/2019   HCT 33.5 (L) 07/11/2019   MCV 84.8 07/11/2019   PLT 131 (L) 07/11/2019    No results found for: LABCA2  No components found for: [id number]  No results for input(s): INR in the last 168 hours.  No results found for: LABCA2  No results found for: HMC947  No results found for: SJG283  No results found for: MOQ947  No results found for: CA2729  No components found for: HGQUANT  No results found for: CEA1 / No results found for: CEA1   No results found for: AFPTUMOR  No results found for: CHROMOGRNA  No results found for: KPAFRELGTCHN, LAMBDASER, KAPLAMBRATIO (kappa/lambda light chains)  No results found for: HGBA, HGBA2QUANT, HGBFQUANT,  HGBSQUAN (Hemoglobinopathy evaluation)   No results found for: LDH  No results found for: IRON, TIBC, IRONPCTSAT (Iron and TIBC)  No results found for: FERRITIN  Urinalysis No results found for: COLORURINE, APPEARANCEUR, LABSPEC, PHURINE, GLUCOSEU, HGBUR, BILIRUBINUR, KETONESUR, PROTEINUR, UROBILINOGEN, NITRITE, LEUKOCYTESUR   STUDIES: No results found.   ELIGIBLE FOR AVAILABLE RESEARCH PROTOCOL:BR003?--Depending on final surgical pathology  ASSESSMENT: 54 y.o. [place] woman status post left breast upper outer quadrant biopsy 04/03/2019 for a clinical T1b N0, stage IB invasive ductal carcinoma, grade 3, functionally triple negative (estrogen receptor is weakly positive at 10%, progesterone receptor negative, HER-2 not amplified), with an MIB-1 of 30%  (1) status post left lumpectomy and sentinel lymph node sampling 04/17/2019 for a pT1c pN0, stage IB invasive ductal carcinoma, grade 3, triple negative, with negative margins  (a) a total of 2 sentinel lymph nodes were removed  (2) adjuvant chemotherapy consisting of cyclophosphamide and docetaxel every 21 days x 4 started 05/23/2019  (3) adjuvant radiation to follow  (4) genetics testing 04/19/2019 through the [facility] Breast Cancer STAT Panel + Common Hereditary Cancers Panel 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 STK11, TP53, TSC1, TSC2, and VHL.  The following  genes were evaluated for sequence changes only: SDHA  and HOXB13 c.251G>A variant only.  (b) current data suggests an increase risk of breast and ovarian cancer in patients carrying a BARD1 mutation, but data is preliminary and insufficient for definitive recommendations   PLAN: Juliya has finally figured out how to get ahead of the chemotherapy problems and really hydrated herself well after cycle 3 and that made all the difference.  She has 1 more to go.  The best Mother's Day present she could receive was both her daughters graduating and that is really terrific.  I will let Dr. Lisbeth Renshaw her radiation doctor know that she will be done with chemotherapy the last week of this month.  Likely she will start radiation the second half of June.  Right now Denesia is not able to exercise beyond just taking care of herself because she is so tired but as soon as she starts feeling better I have encouraged her to walk farther and longer.  Total encounter time 25 minutes.Sarajane Jews C. Loni Delbridge, MD 07/11/19 11:25 AM Medical Oncology and Hematology [facility] [place], [place] [address] Tel. [phone]    Fax. [phone]   I, Wilburn Mylar, am acting as scribe for Dr. Virgie Dad. Cheryn Lundquist.  I, Lurline Del MD, have reviewed the above documentation for accuracy and completeness, and I agree with the above.    *Total Encounter Time as defined by the Centers for Medicare and Medicaid Services includes, in addition to the face-to-face time of a patient visit (documented in the note above) non-face-to-face time: obtaining and reviewing outside history, ordering and reviewing medications, tests or procedures, care coordination (communications with other health care professionals or caregivers) and documentation in the medical record.

## 2019-07-11 ENCOUNTER — Inpatient Hospital Stay (HOSPITAL_BASED_OUTPATIENT_CLINIC_OR_DEPARTMENT_OTHER): Payer: BC Managed Care – PPO | Admitting: Oncology

## 2019-07-11 ENCOUNTER — Inpatient Hospital Stay: Payer: BC Managed Care – PPO

## 2019-07-11 ENCOUNTER — Other Ambulatory Visit: Payer: Self-pay

## 2019-07-11 VITALS — BP 161/87 | HR 99 | Temp 99.1°F | Resp 20 | Ht 64.0 in | Wt 306.3 lb

## 2019-07-11 DIAGNOSIS — C50412 Malignant neoplasm of upper-outer quadrant of left female breast: Secondary | ICD-10-CM

## 2019-07-11 DIAGNOSIS — Z90710 Acquired absence of both cervix and uterus: Secondary | ICD-10-CM | POA: Diagnosis not present

## 2019-07-11 DIAGNOSIS — Z95828 Presence of other vascular implants and grafts: Secondary | ICD-10-CM

## 2019-07-11 DIAGNOSIS — Z5111 Encounter for antineoplastic chemotherapy: Secondary | ICD-10-CM | POA: Diagnosis not present

## 2019-07-11 DIAGNOSIS — Z1501 Genetic susceptibility to malignant neoplasm of breast: Secondary | ICD-10-CM

## 2019-07-11 DIAGNOSIS — Z170 Estrogen receptor positive status [ER+]: Secondary | ICD-10-CM

## 2019-07-11 LAB — COMPREHENSIVE METABOLIC PANEL
ALT: 26 U/L (ref 0–44)
AST: 27 U/L (ref 15–41)
Albumin: 3.5 g/dL (ref 3.5–5.0)
Alkaline Phosphatase: 81 U/L (ref 38–126)
Anion gap: 10 (ref 5–15)
BUN: 17 mg/dL (ref 6–20)
CO2: 30 mmol/L (ref 22–32)
Calcium: 8.9 mg/dL (ref 8.9–10.3)
Chloride: 100 mmol/L (ref 98–111)
Creatinine, Ser: 0.83 mg/dL (ref 0.44–1.00)
GFR calc Af Amer: >60 mL/min (ref >60)
GFR calc non Af Amer: >60 mL/min (ref >60)
Glucose, Bld: 101 mg/dL — ABNORMAL HIGH (ref 70–99)
Potassium: 3.5 mmol/L (ref 3.5–5.1)
Sodium: 140 mmol/L (ref 135–145)
Total Bilirubin: 0.6 mg/dL (ref 0.3–1.2)
Total Protein: 6.4 g/dL — ABNORMAL LOW (ref 6.5–8.1)

## 2019-07-11 LAB — CBC WITH DIFFERENTIAL/PLATELET
Abs Immature Granulocytes: 0.21 10*3/uL — ABNORMAL HIGH (ref 0.00–0.07)
Basophils Absolute: 0 10*3/uL (ref 0.0–0.1)
Basophils Relative: 1 %
Eosinophils Absolute: 0.1 10*3/uL (ref 0.0–0.5)
Eosinophils Relative: 1 %
HCT: 33.5 % — ABNORMAL LOW (ref 36.0–46.0)
Hemoglobin: 10.7 g/dL — ABNORMAL LOW (ref 12.0–15.0)
Immature Granulocytes: 5 %
Lymphocytes Relative: 34 %
Lymphs Abs: 1.4 10*3/uL (ref 0.7–4.0)
MCH: 27.1 pg (ref 26.0–34.0)
MCHC: 31.9 g/dL (ref 30.0–36.0)
MCV: 84.8 fL (ref 80.0–100.0)
Monocytes Absolute: 1 10*3/uL (ref 0.1–1.0)
Monocytes Relative: 24 %
Neutro Abs: 1.5 10*3/uL — ABNORMAL LOW (ref 1.7–7.7)
Neutrophils Relative %: 35 %
Platelets: 131 10*3/uL — ABNORMAL LOW (ref 150–400)
RBC: 3.95 MIL/uL (ref 3.87–5.11)
RDW: 15.9 % — ABNORMAL HIGH (ref 11.5–15.5)
WBC: 4.3 10*3/uL (ref 4.0–10.5)
nRBC: 2.8 % — ABNORMAL HIGH (ref 0.0–0.2)

## 2019-07-11 MED ORDER — SODIUM CHLORIDE 0.9% FLUSH
10.0000 mL | INTRAVENOUS | Status: DC | PRN
  Administered 2019-07-11: 10 mL via INTRAVENOUS
  Filled 2019-07-11: qty 10

## 2019-07-11 MED ORDER — HEPARIN SOD (PORK) LOCK FLUSH 100 UNIT/ML IV SOLN
500.0000 [IU] | Freq: Once | INTRAVENOUS | Status: AC
  Administered 2019-07-11: 500 [IU] via INTRAVENOUS
  Filled 2019-07-11: qty 5

## 2019-07-11 NOTE — Progress Notes (Signed)
I agree with the above plan 

## 2019-07-12 ENCOUNTER — Telehealth: Payer: Self-pay | Admitting: Oncology

## 2019-07-12 NOTE — Telephone Encounter (Signed)
No 5/13 los. No changes made to pt's schedule.  

## 2019-07-19 ENCOUNTER — Telehealth: Payer: Self-pay | Admitting: *Deleted

## 2019-07-19 ENCOUNTER — Encounter: Payer: Self-pay | Admitting: Oncology

## 2019-07-19 NOTE — Telephone Encounter (Signed)
This RN call pt per her my chart message per increased saliva.  Per discussion- she denies any changes in color on gums or white patches or sores.  This RN discussed use of TID warm salt water rinses as well as use of coconut oil for benefit and comfort.  Aymar verbalized understanding of above as well as to call if further symptoms occur

## 2019-07-21 ENCOUNTER — Encounter: Payer: Self-pay | Admitting: Neurology

## 2019-07-23 ENCOUNTER — Encounter: Payer: Self-pay | Admitting: Neurology

## 2019-07-23 ENCOUNTER — Other Ambulatory Visit: Payer: Self-pay

## 2019-07-23 ENCOUNTER — Ambulatory Visit (INDEPENDENT_AMBULATORY_CARE_PROVIDER_SITE_OTHER): Payer: BC Managed Care – PPO | Admitting: Neurology

## 2019-07-23 VITALS — BP 149/78 | HR 73 | Ht 64.0 in | Wt 309.0 lb

## 2019-07-23 DIAGNOSIS — G4733 Obstructive sleep apnea (adult) (pediatric): Secondary | ICD-10-CM

## 2019-07-23 DIAGNOSIS — Z9989 Dependence on other enabling machines and devices: Secondary | ICD-10-CM

## 2019-07-23 NOTE — Progress Notes (Signed)
Subjective:    Patient ID: Loretta Nelson is a 54 y.o. female.  HPI     Interim history:   Loretta Nelson is a 54 year old right-handed woman with an underlying medical history of hypertension, vitamin D deficiency, Graves' disease with status post radioactive iodine, reflux disease, arthritis, anxiety, anemia, allergic rhinitis, hypothyroidism, obesity with a BMI of over 37, and recent diagnosis of L sided breast cancer with status post surgery in February and adjuvant chemotherapy since March, with adjuvant radiation therapy pending, who Presents for follow-up consultation of her obstructive sleep apnea after interim testing and starting CPAP therapy.  The patient is unaccompanied today.  I first met her at the request of Dr. Leonie Man on 02/27/2019, at which time she reported snoring and daytime somnolence and had cognitive complaints.  She was advised to proceed with a sleep study.  She had a baseline sleep study, followed by a CPAP titration study.  Her baseline sleep study from 03/15/2019 showed severe obstructive sleep apnea with a total AHI of 57.4/hour, REM AHI of 98.6/hour, supine AHI of 53/hour and O2 nadir of 73%. REM latency was delayed and REM percentage markedly reduced at 4%.  She was advised to return for a CPAP titration study.  She had this on 04/15/2019.  She was fitted with a nasal mask but would prefer nasal pillows at home as she indicated at the time of her sleep study.  She was titrated from 6 cm to 14 cm.  On the final pressure, her AHI was 0.9/h with supine REM sleep achieved an O2 nadir at 90%.  She was advised to start CPAP therapy at home at a pressure of 14 cm.  She emailed at the end of March due to difficulty tolerating the pressure and I reduced it to 12 cm.   Today, 07/23/2019: I reviewed her CPAP compliance data from 06/22/2019 through 07/21/2019, which is a total of 30 days, during which time she used her machine every night with percent use days greater than 4 hours at  97%, indicating excellent compliance with an average usage of 6 hours, residual AHI at goal at 1.3/hour, Leak acceptable with a 95th percentile at 9.8 L/min on a pressure of 12 cm with EPR of 3.  She reports that she has had difficulty sleeping since starting chemotherapy. Especially the first 2 weeks after her chemotherapy cycle she is having a tough time. She was diagnosed with left-sided breast cancer in the middle of going through sleep study testing. She has 1 more chemotherapy cycle to go and will start adjuvant radiation therapy. She has a good support system, thankfully, both daughters have graduated college successfully. Her mother has moved him as well. She is motivated to continue with CPAP and tolerates the reduced pressure better. She is currently using a nasal cushion interface but also has a hybrid style fullface mask available. She has struggled with mouth dryness and tries to hydrate better. She had to delay her neuropsychological evaluation at [facility] neurology because of her cancer treatment. She is planning to reschedule her appointment and then schedule a follow-up with Dr. Leonie Man.  Previously:   01/31/19: (She) reports snoring and excessive daytime somnolence.  She has had cognitive complaints.   I reviewed your office note from 01/15/2019.  Her Epworth sleepiness score is 15 out of 24, fatigue severity score is 41 out of 63.  She is divorced, her 30 year old twin daughters stay with her currently, they are in college remotely.  She naps almost  every day.  She started Cerefolin about a week ago and feels like her energy level is a little better since then.  She works as a Therapist, sports.  Most of her meetings are virtual now.  She goes to bed around 10 and rise time is around 530.  She does not have night to night nocturia or recurrent morning headaches.  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  She has had trouble losing weight.  She is a non-smoker and does not  drink alcohol and does not drink caffeine on a day-to-day basis.  She reports vivid dreams at times.  She does not wake up rested and sleep is interrupted, her daughters have commented on her snoring.  She is not aware of any family history of sleep apnea.  Her Past Medical History Is Significant For: Past Medical History:  Diagnosis Date  . Allergic rhinitis   . Anemia    low iron  . Anemia   . Anxiety   . Anxiety   . Arthritis    right knee, surgery done  . Cancer ([place]) 2021   left breast   . Complication of anesthesia    woke up during EGD  . Esophageal stricture   . Family history of breast cancer   . GERD (gastroesophageal reflux disease)   . Graves disease 01/13/11   radioactive iodine treatment, 34.2 millicuries  . Headache    tension headaches  . Hiatal hernia    small  . Hypertension   . Hyperthyroidism   . Hypothyroidism    s/p treatment  . Murmur    benign, h/o, caused by hyperdynamic contraction  . Obesity   . Pneumonia 2017   inhaler prescribed  . Sleep apnea 2021   no CPAP as yet, last study done lastnight 04/15/2019  . Tendonitis of shoulder, right   . Vitamin D deficiency   . Vitamin D deficiency     Her Past Surgical History Is Significant For: Past Surgical History:  Procedure Laterality Date  . BALLOON DILATION N/A 09/07/2016   Procedure: BALLOON DILATION;  Surgeon: Arta Silence, MD;  Location: [facility] ENDOSCOPY;  Service: Endoscopy;  Laterality: N/A;  . BREAST LUMPECTOMY WITH RADIOACTIVE SEED AND SENTINEL LYMPH NODE BIOPSY Left 04/17/2019   Procedure: LEFT BREAST LUMPECTOMY WITH RADIOACTIVE SEED AND SENTINEL LYMPH NODE BIOPSY;  Surgeon: Donnie Mesa, MD;  Location: [facility];  Service: General;  Laterality: Left;  LMA, PEC BLOCK  . CESAREAN SECTION  1999  . COLONOSCOPY    . ESOPHAGOGASTRODUODENOSCOPY    . ESOPHAGOGASTRODUODENOSCOPY (EGD) WITH PROPOFOL N/A 09/07/2016   Procedure: ESOPHAGOGASTRODUODENOSCOPY (EGD) WITH PROPOFOL;  Surgeon: Arta Silence,  MD;  Location: [facility];  Service: Endoscopy;  Laterality: N/A;  . HYSTERECTOMY ABDOMINAL WITH SALPINGECTOMY Bilateral 02/17/2017   Procedure: HYSTERECTOMY ABDOMINAL WITH SALPINGECTOMY;  Surgeon: Christophe Louis, MD;  Location: [facility];  Service: Gynecology;  Laterality: Bilateral;  . KNEE ARTHROSCOPY     for torn meniscus  . PORTACATH PLACEMENT Right 05/22/2019   Procedure: INSERTION PORT-A-CATH WITH ULTRASOUND GUIDANCE;  Surgeon: Donnie Mesa, MD;  Location: [facility];  Service: General;  Laterality: Right;  Endotrachial tube  . WISDOM TOOTH EXTRACTION    . WISDOM TOOTH EXTRACTION      Her Family History Is Significant For: Family History  Problem Relation Age of Onset  . Hypertension Mother   . CAD Maternal Grandfather   . Breast cancer Paternal Grandmother   . HIV Brother   . Heart attack  Brother        sudden MI vs PE  . Breast cancer Sister 53  . Breast cancer Other        one dx 22s, others dx in 69s  . Colon polyps Neg Hx   . Colon cancer Neg Hx   . Liver disease Neg Hx     Her Social History Is Significant For: Social History   Socioeconomic History  . Marital status: Divorced    Spouse name: Not on file  . Number of children: Not on file  . Years of education: Not on file  . Highest education level: Not on file  Occupational History  . Not on file  Tobacco Use  . Smoking status: Never Smoker  . Smokeless tobacco: Never Used  Substance and Sexual Activity  . Alcohol use: No    Alcohol/week: 0.0 standard drinks  . Drug use: No  . Sexual activity: Never    Birth control/protection: Abstinence  Other Topics Concern  . Not on file  Social History Narrative  . Not on file   Social Determinants of Health   Financial Resource Strain:   . Difficulty of Paying Living Expenses:   Food Insecurity:   . Worried About Charity fundraiser in the Last Year:   . Arboriculturist in the Last Year:   Transportation Needs:   . Film/video editor (Medical):   Marland Kitchen Lack  of Transportation (Non-Medical):   Physical Activity:   . Days of Exercise per Week:   . Minutes of Exercise per Session:   Stress:   . Feeling of Stress :   Social Connections:   . Frequency of Communication with Friends and Family:   . Frequency of Social Gatherings with Friends and Family:   . Attends Religious Services:   . Active Member of Clubs or Organizations:   . Attends Archivist Meetings:   Marland Kitchen Marital Status:     Her Allergies Are:  No Known Allergies:   Her Current Medications Are:  Outpatient Encounter Medications as of 07/23/2019  Medication Sig  . amLODipine (NORVASC) 5 MG tablet Take 5 mg by mouth daily.   . Cholecalciferol 4000 UNITS CAPS Take 4,000 Units by mouth daily.   . ciprofloxacin (CIPRO) 500 MG tablet Take 1 tablet (500 mg total) by mouth 2 (two) times daily. Take for 5 days starting one week after a chemotherapy dose  . citalopram (CELEXA) 40 MG tablet Take 40 mg by mouth daily.  Marland Kitchen dexamethasone (DECADRON) 4 MG tablet Take 2 tablets (8 mg total) by mouth 2 (two) times daily. Start the day before Taxotere. Then again the day after chemo for 3 days.  Marland Kitchen EDARBYCLOR 40-25 MG TABS Take 1 tablet by mouth daily.  Marland Kitchen ibuprofen (ADVIL) 200 MG tablet Take 400 mg by mouth every 6 (six) hours as needed for moderate pain.  Marland Kitchen KLOR-CON M20 20 MEQ tablet Take 20 mEq by mouth 2 (two) times daily.   Marland Kitchen L-Methylfolate-B12-B6-B2 (CEREFOLIN) 07-29-48-5 MG TABS Take 1 tablet by mouth every morning.  Marland Kitchen levothyroxine (SYNTHROID) 175 MCG tablet Take 175 mcg by mouth daily before breakfast.   . lidocaine-prilocaine (EMLA) cream Apply to affected area once (Patient taking differently: Apply 1 application topically daily as needed (port access). )  . loratadine (CLARITIN) 10 MG tablet TAKE 1 TABLET BY MOUTH EVERY DAY (Patient taking differently: Take 10 mg by mouth daily. )  . metoprolol (TOPROL-XL) 200 MG 24 hr tablet Take  200 mg by mouth daily.   . ondansetron (ZOFRAN) 8 MG  tablet Take 1 tablet (8 mg total) by mouth every 8 (eight) hours as needed for nausea or vomiting. Start on day 3 after chemotherapy  . [DISCONTINUED] LORazepam (ATIVAN) 0.5 MG tablet Take 1 tablet (0.5 mg total) by mouth every 6 (six) hours as needed (Nausea or vomiting). (Patient not taking: Reported on 07/04/2019)  . [DISCONTINUED] MELATONIN PO Take 1 tablet by mouth at bedtime as needed (sleep).  . [DISCONTINUED] omeprazole (PRILOSEC) 40 MG capsule Take 40 mg by mouth See admin instructions. Take 40 mg daily, may take a second 40 mg dose as needed for acid reflux  . [DISCONTINUED] prochlorperazine (COMPAZINE) 10 MG tablet Take 1 tablet (10 mg total) by mouth every 6 (six) hours as needed (Nausea or vomiting). (Patient not taking: Reported on 07/04/2019)  . [DISCONTINUED] spironolactone (ALDACTONE) 50 MG tablet Take 50 mg by mouth daily.   No facility-administered encounter medications on file as of 07/23/2019.  :  Review of Systems:  Out of a complete 14 point review of systems, all are reviewed and negative with the exception of these symptoms as listed below: Review of Systems  Neurological:       Pt presents today for initial CPAP follow up. Pt states that around the time she started CPAP she also started chemo treatment. She is doing fairly well but notices on the week she gets the chemo she has a hard time sleeping for the first 2 weeks. DME [employer].     Objective:  Neurological Exam  Physical Exam Physical Examination:   Vitals:   07/23/19 1607  BP: (!) 149/78  Pulse: 73    General Examination: The patient is a very pleasant 54 y.o. female in no acute distress. She appears well-developed and well-nourished and well groomed.   HEENT: Normocephalic, atraumatic, pupils are equal, round and reactive to light, extraocular tracking is good without limitation to gaze excursion or nystagmus noted. Hearing is grossly intact. Face is symmetric with normal facial animation. Speech is clear  with no dysarthria noted. There is no hypophonia. There is no lip, neck/head, jaw or voice tremor. Neck is supple with full range of passive and active motion. There are no carotid bruits on auscultation. Oropharynx exam reveals: mild mouth dryness, good dental hygiene and mild airway crowding.  Tongue protrudes centrally and palate elevates symmetrically.    Chest: Clear to auscultation without wheezing, rhonchi or crackles noted.  Heart: S1+S2+0, regular and normal without murmurs, rubs or gallops noted.   Abdomen: Soft, non-tender and non-distended.  Extremities: There is no pitting edema in the distal lower extremities bilaterally.   Skin: Warm and dry without trophic changes noted.   Musculoskeletal: exam reveals no obvious joint deformities, tenderness or joint swelling or erythema.   Neurologically:  Mental status: The patient is awake, alert and oriented in all 4 spheres. Her immediate and remote memory, attention, language skills and fund of knowledge are appropriate. There is no evidence of aphasia, agnosia, apraxia or anomia. Speech is clear with normal prosody and enunciation. Thought process is linear. Mood is normal and affect is normal.  Cranial nerves II - XII are as described above under HEENT exam.  Motor exam: Normal bulk, strength and tone is noted. There is no tremor, Fine motor skills and coordination: grossly intact.  Cerebellar testing: No dysmetria or intention tremor. There is no truncal or gait ataxia.  Sensory exam: intact to light touch in the upper  and lower extremities.  Gait, station and balance: She stands easily. No veering to one side is noted. No leaning to one side is noted. Posture is age-appropriate and stance is narrow based. Gait shows normal stride length and normal pace. No problems turning are noted.   Assessment and Plan:  In summary, Loretta Nelson is a very pleasant 54 year old female with an underlying medical history of hypertension,  vitamin D deficiency, Graves' disease with status post radioactive iodine, reflux disease, arthritis, anxiety, anemia, allergic rhinitis, hypothyroidism, obesity, left-sided breast cancer with status post surgery, chemotherapy and radiation therapy planned, who presents for follow-up consultation of her obstructive sleep apnea which was deemed in the severe range with an AHI of 57.4/h, O2 nadir of 73%. She had baseline sleep study testing on 03/15/2019 and a subsequent titration study on 04/15/2019. She has established CPAP therapy since 05/08/2019 and was originally started on a treatment pressure of 14 cm but could not tolerate the high pressure. We reduced it to 12 cm subsequently and she has been fully compliant with treatment. She is highly commended for her treatment adherence. She benefits from treatment but has had some difficulty with her sleep especially when she has chemotherapy. She is doing better. She only has 1 more chemotherapy cycle left thankfully. She has a good support system. She had to delay her neuropsychology appointment for cognitive testing because of her cancer diagnosis recently. She is planning to reschedule the appointment and schedule a follow-up to see Dr. Leonie Man.  From the sleep apnea standpoint she is doing very well and we mutually agreed to try to bring down the pressure one more notch to 11 cm. She may be able to tolerate this better and have less mouth dryness. She is currently using a nasal cushion interface. She is advised to follow-up routinely to see one of our nurse practitioners in 6 months, sooner if needed. We talked about her sleep study results and her compliance data in detail today. I answered all her questions today and she was in agreement with the plan.  I spent 30 minutes in total face-to-face time and in reviewing records during pre-charting, more than 50% of which was spent in counseling and coordination of care, reviewing test results, reviewing medications and  treatment regimen and/or in discussing or reviewing the diagnosis of OSA, the prognosis and treatment options. Pertinent laboratory and imaging test results that were available during this visit with the patient were reviewed by me and considered in my medical decision making (see chart for details).

## 2019-07-23 NOTE — Patient Instructions (Addendum)
Please continue using your CPAP regularly. While your insurance requires that you use CPAP at least 4 hours each night on 70% of the nights, I recommend, that you not skip any nights and use it throughout the night if you can. Getting used to CPAP and staying with the treatment long term does take time and patience and discipline. Untreated obstructive sleep apnea when it is moderate to severe can have an adverse impact on cardiovascular health and raise her risk for heart disease, arrhythmias, hypertension, congestive heart failure, stroke and diabetes. Untreated obstructive sleep apnea causes sleep disruption, nonrestorative sleep, and sleep deprivation. This can have an impact on your day to day functioning and cause daytime sleepiness and impairment of cognitive function, memory loss, mood disturbance, and problems focussing. Using CPAP regularly can improve these symptoms.  As discussed, we will lower your pressure one more notch to 11 cm at this time, you have done a great job using your CPAP and you are fully compliant with it. Keep up the good work and hang in there, your sleep difficulties may improve after you are finished with chemotherapy.   Please follow-up routinely in 6 months and sleep clinic to see one of our nurse practitioners.   When you feel more stable, please reschedule your neuropsychology appointment at [facility] neurology and schedule a follow-up with Dr. Leonie Man afterwards.

## 2019-07-24 NOTE — Progress Notes (Signed)
[place]  [phone] [phone]) [phone]     ID: Loretta Nelson DOB: [birth year]  MR#: [id number]  JYN#:[id number]  Patient Care Team: [name], MD as PCP - General (Family Medicine) Mauro Kaufmann, RN as Oncology Nurse Navigator Rockwell Germany, RN as Oncology Nurse Navigator Donnie Mesa, MD as Consulting Physician (General Surgery) Magrinat, Virgie Dad, MD as Consulting Physician (Oncology) Kyung Rudd, MD as Consulting Physician (Radiation Oncology) Delrae Rend, MD as Consulting Physician (Endocrinology) Star Age, MD as Consulting Physician (Neurology) Garvin Fila, MD as Consulting Physician (Neurology) Christophe Louis, MD as Consulting Physician (Obstetrics and Gynecology) Arta Silence, MD as Consulting Physician (Gastroenterology) Scot Dock, NP OTHER MD:  CHIEF COMPLAINT: triple negative breast cancer  CURRENT TREATMENT: Adjuvant chemotherapy   INTERVAL HISTORY: Loretta Nelson returns today for follow up and treatment of her triple negative breast cancer.  She is accompanied by her daughter [name].  She began her adjuvant chemotherapy consisting of cyclophosphamide and docetaxel on 05/23/2019. The plan is for her to receive this every 21 days for 4 cycles.  Today is cycle 4 day 1 of treatment.  She receives neulasta support with Fulphilia on day 3 of treatment.     REVIEW OF SYSTEMS:  Loretta Nelson is feeling well and has no significant issues today.  She has no peripheral neuropathy, and has her radiation oncology consultation set up for June 8 with Dr. Lisbeth Renshaw.    She denies any fever, chills, chest pain, palpitations, cough, shortness of breath, bowel/bladder changes, headaches, vision issues, nausea, vomiting, or any other concerns.  A detailed ROS was otherwise non contributory.    HISTORY OF CURRENT ILLNESS: From the original intake note:  Loretta Nelson had routine screening mammography on 03/19/2019 showing a possible  abnormality in the left breast. She underwent left diagnostic mammography with tomography and left breast ultrasonography at [facility] on 03/29/2019 showing: breast density category B; 8 mm mass in left breast at 12 o'clock; left axilla negative for lymphadenopathy.  Accordingly on 04/03/2019 she proceeded to biopsy of the left breast area in question. The pathology from this procedure ([id number]) showed: invasive mammary carcinoma, grade 3, e-cadherin positive. Prognostic indicators significant for: estrogen receptor, 10% positive with weak staining intensity and progesterone receptor, 0% negative. Proliferation marker Ki67 at 30%. HER2 negative by immunohistochemistry (1+).  The patient's subsequent history is as detailed below.   PAST MEDICAL HISTORY: Past Medical History:  Diagnosis Date  . Allergic rhinitis   . Anemia    low iron  . Anemia   . Anxiety   . Anxiety   . Arthritis    right knee, surgery done  . Cancer ([place]) 2021   left breast   . Complication of anesthesia    woke up during EGD  . Esophageal stricture   . Family history of breast cancer   . GERD (gastroesophageal reflux disease)   . Graves disease 01/13/11   radioactive iodine treatment, 29.5 millicuries  . Headache    tension headaches  . Hiatal hernia    small  . Hypertension   . Hyperthyroidism   . Hypothyroidism    s/p treatment  . Murmur    benign, h/o, caused by hyperdynamic contraction  . Obesity   . Pneumonia 2017   inhaler prescribed  . Sleep apnea 2021   no CPAP as yet, last study done lastnight 04/15/2019  . Tendonitis of shoulder, right   . Vitamin D deficiency   . Vitamin D  deficiency     PAST SURGICAL HISTORY: Past Surgical History:  Procedure Laterality Date  . BALLOON DILATION N/A 09/07/2016   Procedure: BALLOON DILATION;  Surgeon: Arta Silence, MD;  Location[facility];  Service: Endoscopy;  Laterality: N/A;  . BREAST LUMPECTOMY WITH RADIOACTIVE SEED AND SENTINEL LYMPH  NODE BIOPSY Left 04/17/2019   Procedure: LEFT BREAST LUMPECTOMY WITH RADIOACTIVE SEED AND SENTINEL LYMPH NODE BIOPSY;  Surgeon: Donnie Mesa, MD;  Location: [facility];  Service: General;  Laterality: Left;  LMA, PEC BLOCK  . CESAREAN SECTION  1999  . COLONOSCOPY    . ESOPHAGOGASTRODUODENOSCOPY    . ESOPHAGOGASTRODUODENOSCOPY (EGD) WITH PROPOFOL N/A 09/07/2016   Procedure: ESOPHAGOGASTRODUODENOSCOPY (EGD) WITH PROPOFOL;  Surgeon: Arta Silence, MD;  Location: [facility];  Service: Endoscopy;  Laterality: N/A;  . HYSTERECTOMY ABDOMINAL WITH SALPINGECTOMY Bilateral 02/17/2017   Procedure: HYSTERECTOMY ABDOMINAL WITH SALPINGECTOMY;  Surgeon: Christophe Louis, MD;  Location: [facility];  Service: Gynecology;  Laterality: Bilateral;  . KNEE ARTHROSCOPY     for torn meniscus  . PORTACATH PLACEMENT Right 05/22/2019   Procedure: INSERTION PORT-A-CATH WITH ULTRASOUND GUIDANCE;  Surgeon: Donnie Mesa, MD;  Location: [facility];  Service: General;  Laterality: Right;  Endotrachial tube  . WISDOM TOOTH EXTRACTION    . WISDOM TOOTH EXTRACTION      FAMILY HISTORY: Family History  Problem Relation Age of Onset  . Hypertension Mother   . CAD Maternal Grandfather   . Breast cancer Paternal Grandmother   . HIV Brother   . Heart attack Brother        sudden MI vs PE  . Breast cancer Sister 22  . Breast cancer Other        one dx 3s, others dx in 26s  . Colon polyps Neg Hx   . Colon cancer Neg Hx   . Liver disease Neg Hx    Patient's father was in his late 40s when he died from causes unknown to the patient.  Patient's mother is 45 years old (as of 04/2019). The patient denies a family hx of ovarian cancer. She reports breast cancer in her sister at age 31, in her paternal grandmother, and in 3 paternal cousins. She had 4 siblings, 2 brothers and 2 sisters, though only her sisters are still living.   GYNECOLOGIC HISTORY:  Patient's last menstrual period was 02/13/2017 (exact date). Menarche: 54 years old Age at  first live birth: 54 years old [place] P 2 (twins) LMP 01/2017 Contraceptive: used from 1610-9604 without complication HRT never used  Hysterectomy? Yes, 01/2017, benign pathology BSO? 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 She is divorced. She lives at home with her twin daughters, Maryjo Rochester and Edmon Crape, who are 54 years old fraternal twins and attending college remotely. Maryjo Rochester is studying music and communications at [employer].Edmon Crape is studying IT.    ADVANCED DIRECTIVES: Not in place; the patient intends to name her sister Madelon Lips as her HCPOA [phone])   HEALTH MAINTENANCE: Social History   Tobacco Use  . Smoking status: Never Smoker  . Smokeless tobacco: Never Used  Substance Use Topics  . Alcohol use: No    Alcohol/week: 0.0 standard drinks  . Drug use: No     Colonoscopy: at age 47  PAP: 02/2018  Bone density: never done   No Known Allergies  Current Outpatient Medications  Medication Sig Dispense Refill  . amLODipine (  NORVASC) 5 MG tablet Take 5 mg by mouth daily.   5  . Cholecalciferol 4000 UNITS CAPS Take 4,000 Units by mouth daily.     . citalopram (CELEXA) 40 MG tablet Take 40 mg by mouth daily.    Marland Kitchen dexamethasone (DECADRON) 4 MG tablet Take 2 tablets (8 mg total) by mouth 2 (two) times daily. Start the day before Taxotere. Then again the day after chemo for 3 days. 30 tablet 1  . EDARBYCLOR 40-25 MG TABS Take 1 tablet by mouth daily.  5  . ibuprofen (ADVIL) 200 MG tablet Take 400 mg by mouth every 6 (six) hours as needed for moderate pain.    Marland Kitchen KLOR-CON M20 20 MEQ tablet Take 20 mEq by mouth 2 (two) times daily.   1  . L-Methylfolate-B12-B6-B2 (CEREFOLIN) 07-29-48-5 MG TABS Take 1 tablet by mouth every morning. 90 tablet 3  . levothyroxine (SYNTHROID) 175 MCG tablet Take 175 mcg by mouth daily before breakfast.   5  . lidocaine-prilocaine  (EMLA) cream Apply to affected area once (Patient taking differently: Apply 1 application topically daily as needed (port access). ) 30 g 3  . loratadine (CLARITIN) 10 MG tablet TAKE 1 TABLET BY MOUTH EVERY DAY (Patient taking differently: Take 10 mg by mouth daily. ) 90 tablet 1  . metoprolol (TOPROL-XL) 200 MG 24 hr tablet Take 200 mg by mouth daily.   5  . ondansetron (ZOFRAN) 8 MG tablet Take 1 tablet (8 mg total) by mouth every 8 (eight) hours as needed for nausea or vomiting. Start on day 3 after chemotherapy 30 tablet 1  . pantoprazole (PROTONIX) 40 MG tablet Take 40 mg by mouth 2 (two) times daily.    . ciprofloxacin (CIPRO) 500 MG tablet Take 1 tablet (500 mg total) by mouth 2 (two) times daily. Take for 5 days starting one week after a chemotherapy dose (Patient not taking: Reported on 07/25/2019) 40 tablet 0   No current facility-administered medications for this visit.    OBJECTIVE:   Vitals:   07/25/19 0955  BP: (!) 147/84  Pulse: 89  Resp: 18  Temp: 98.3 F (36.8 C)  SpO2: 100%     Body mass index is 53.42 kg/m.   Wt Readings from Last 3 Encounters:  07/25/19 (!) 311 lb 3.2 oz (141.2 kg)  07/23/19 (!) 309 lb (140.2 kg)  07/11/19 (!) 306 lb 4.8 oz (138.9 kg)      ECOG FS:1 - Symptomatic but completely ambulatory GENERAL: Patient is a well appearing female in no acute distress HEENT:  Sclerae anicteric.  Mask in place. Neck is supple.  NODES:  No cervical, supraclavicular, or axillary lymphadenopathy palpated.  BREAST EXAM:  Deferred. LUNGS:  Clear to auscultation bilaterally.  No wheezes or rhonchi. HEART:  Regular rate and rhythm. No murmur appreciated. ABDOMEN:  Soft, nontender.  Positive, normoactive bowel sounds. No organomegaly palpated. MSK:  No focal spinal tenderness to palpation. Full range of motion bilaterally in the upper extremities. EXTREMITIES:  No peripheral edema.   SKIN:  Clear with no obvious rashes or skin changes. No nail dyscrasia. NEURO:   Nonfocal. Well oriented.  Appropriate affect.     LAB RESULTS:  CMP     Component Value Date/Time   NA 144 07/25/2019 0938   K 3.5 07/25/2019 0938   CL 105 07/25/2019 0938   CO2 28 07/25/2019 0938   GLUCOSE 149 (H) 07/25/2019 0938   BUN 14 07/25/2019 0938   CREATININE 0.77 07/25/2019 2992  CREATININE 0.83 04/10/2019 1240   CALCIUM 9.4 07/25/2019 0938   PROT 7.3 07/25/2019 0938   ALBUMIN 3.8 07/25/2019 0938   AST 18 07/25/2019 0938   AST 17 04/10/2019 1240   ALT 21 07/25/2019 0938   ALT 19 04/10/2019 1240   ALKPHOS 70 07/25/2019 0938   BILITOT 0.5 07/25/2019 0938   BILITOT 0.4 04/10/2019 1240   GFRNONAA >60 07/25/2019 0938   GFRNONAA >60 04/10/2019 1240   GFRAA >60 07/25/2019 0938   GFRAA >60 04/10/2019 1240    No results found for: TOTALPROTELP, ALBUMINELP, A1GS, A2GS, BETS, BETA2SER, GAMS, MSPIKE, SPEI  Lab Results  Component Value Date   WBC 7.8 07/25/2019   NEUTROABS 6.7 07/25/2019   HGB 10.9 (L) 07/25/2019   HCT 34.6 (L) 07/25/2019   MCV 88.7 07/25/2019   PLT 260 07/25/2019    No results found for: LABCA2  No components found for: VPXTGG269  No results for input(s): INR in the last 168 hours.  No results found for: LABCA2  No results found for: SWN462  No results found for: VOJ500  No results found for: XFG182  No results found for: CA2729  No components found for: HGQUANT  No results found for: CEA1 / No results found for: CEA1   No results found for: AFPTUMOR  No results found for: CHROMOGRNA  No results found for: KPAFRELGTCHN, LAMBDASER, KAPLAMBRATIO (kappa/lambda light chains)  No results found for: HGBA, HGBA2QUANT, HGBFQUANT, HGBSQUAN (Hemoglobinopathy evaluation)   No results found for: LDH  No results found for: IRON, TIBC, IRONPCTSAT (Iron and TIBC)  No results found for: FERRITIN  Urinalysis No results found for: COLORURINE, APPEARANCEUR, LABSPEC, PHURINE, GLUCOSEU, HGBUR, BILIRUBINUR, KETONESUR, PROTEINUR,  UROBILINOGEN, NITRITE, LEUKOCYTESUR   STUDIES: No results found.   ELIGIBLE FOR AVAILABLE RESEARCH PROTOCOL:BR003?--Depending on final surgical pathology  ASSESSMENT: 54 y.o. [place] woman status post left breast upper outer quadrant biopsy 04/03/2019 for a clinical T1b N0, stage IB invasive ductal carcinoma, grade 3, functionally triple negative (estrogen receptor is weakly positive at 10%, progesterone receptor negative, HER-2 not amplified), with an MIB-1 of 30%  (1) status post left lumpectomy and sentinel lymph node sampling 04/17/2019 for a pT1c pN0, stage IB invasive ductal carcinoma, grade 3, triple negative, with negative margins  (a) a total of 2 sentinel lymph nodes were removed  (2) adjuvant chemotherapy consisting of cyclophosphamide and docetaxel every 21 days x 4 started 05/23/2019  (3) adjuvant radiation to follow  (4) genetics testing 04/19/2019 through the [facility] Breast Cancer STAT Panel + Common Hereditary Cancers Panel 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 STK11, TP53, TSC1, TSC2, and VHL.  The following genes were evaluated for sequence changes only: SDHA and HOXB13 c.251G>A variant only.  (b) current data suggests an increase risk of breast and ovarian cancer in patients carrying a BARD1 mutation, but data is preliminary and insufficient for definitive recommendations   PLAN: Loretta Nelson is doing  quite well today.  Her labs are stable, and she is tolerating her adjuvant chemotherapy.  She will proceed with her final cycle of this treatment with Docetaxel and Cyclophosphamide today.    She and I reviewed her upcoming appointments.  I recommended that she focus on regaining her strength.  We talked about her upcoming radiation therapy.  She is staying positive and I encouraged her to do so.    We will see Loretta Nelson back next week for f/u.  She was recommended to continue with the appropriate pandemic precautions. She knows to call for any questions that may arise between now and her next appointment.  We are happy to see her sooner if needed.   Total encounter time 20 minutes.Wilber Bihari, NP 07/27/19 10:49 AM Medical Oncology and Hematology [facility] [place], [place] [address] Tel. [phone]    Fax. [phone]  .  *Total Encounter Time as defined by the Centers for Medicare and Medicaid Services includes, in addition to the face-to-face time of a patient visit (documented in the note above) non-face-to-face time: obtaining and reviewing outside history, ordering and reviewing medications, tests or procedures, care coordination (communications with other health care professionals or caregivers) and documentation in the medical record.

## 2019-07-25 ENCOUNTER — Other Ambulatory Visit: Payer: Self-pay

## 2019-07-25 ENCOUNTER — Inpatient Hospital Stay: Payer: BC Managed Care – PPO

## 2019-07-25 ENCOUNTER — Inpatient Hospital Stay (HOSPITAL_BASED_OUTPATIENT_CLINIC_OR_DEPARTMENT_OTHER): Payer: BC Managed Care – PPO | Admitting: Adult Health

## 2019-07-25 ENCOUNTER — Encounter: Payer: Self-pay | Admitting: *Deleted

## 2019-07-25 VITALS — BP 147/84 | HR 89 | Temp 98.3°F | Resp 18 | Ht 64.0 in | Wt 311.2 lb

## 2019-07-25 DIAGNOSIS — Z170 Estrogen receptor positive status [ER+]: Secondary | ICD-10-CM

## 2019-07-25 DIAGNOSIS — Z5111 Encounter for antineoplastic chemotherapy: Secondary | ICD-10-CM | POA: Diagnosis not present

## 2019-07-25 DIAGNOSIS — Z90710 Acquired absence of both cervix and uterus: Secondary | ICD-10-CM

## 2019-07-25 DIAGNOSIS — C50412 Malignant neoplasm of upper-outer quadrant of left female breast: Secondary | ICD-10-CM | POA: Diagnosis not present

## 2019-07-25 DIAGNOSIS — Z95828 Presence of other vascular implants and grafts: Secondary | ICD-10-CM

## 2019-07-25 DIAGNOSIS — Z1501 Genetic susceptibility to malignant neoplasm of breast: Secondary | ICD-10-CM

## 2019-07-25 LAB — CBC WITH DIFFERENTIAL/PLATELET
Abs Immature Granulocytes: 0.2 10*3/uL — ABNORMAL HIGH (ref 0.00–0.07)
Basophils Absolute: 0 10*3/uL (ref 0.0–0.1)
Basophils Relative: 0 %
Eosinophils Absolute: 0 10*3/uL (ref 0.0–0.5)
Eosinophils Relative: 0 %
HCT: 34.6 % — ABNORMAL LOW (ref 36.0–46.0)
Hemoglobin: 10.9 g/dL — ABNORMAL LOW (ref 12.0–15.0)
Immature Granulocytes: 3 %
Lymphocytes Relative: 11 %
Lymphs Abs: 0.8 10*3/uL (ref 0.7–4.0)
MCH: 27.9 pg (ref 26.0–34.0)
MCHC: 31.5 g/dL (ref 30.0–36.0)
MCV: 88.7 fL (ref 80.0–100.0)
Monocytes Absolute: 0.1 10*3/uL (ref 0.1–1.0)
Monocytes Relative: 1 %
Neutro Abs: 6.7 10*3/uL (ref 1.7–7.7)
Neutrophils Relative %: 85 %
Platelets: 260 10*3/uL (ref 150–400)
RBC: 3.9 MIL/uL (ref 3.87–5.11)
RDW: 18 % — ABNORMAL HIGH (ref 11.5–15.5)
WBC: 7.8 10*3/uL (ref 4.0–10.5)
nRBC: 0.4 % — ABNORMAL HIGH (ref 0.0–0.2)

## 2019-07-25 LAB — COMPREHENSIVE METABOLIC PANEL
ALT: 21 U/L (ref 0–44)
AST: 18 U/L (ref 15–41)
Albumin: 3.8 g/dL (ref 3.5–5.0)
Alkaline Phosphatase: 70 U/L (ref 38–126)
Anion gap: 11 (ref 5–15)
BUN: 14 mg/dL (ref 6–20)
CO2: 28 mmol/L (ref 22–32)
Calcium: 9.4 mg/dL (ref 8.9–10.3)
Chloride: 105 mmol/L (ref 98–111)
Creatinine, Ser: 0.77 mg/dL (ref 0.44–1.00)
GFR calc Af Amer: >60 mL/min (ref >60)
GFR calc non Af Amer: >60 mL/min (ref >60)
Glucose, Bld: 149 mg/dL — ABNORMAL HIGH (ref 70–99)
Potassium: 3.5 mmol/L (ref 3.5–5.1)
Sodium: 144 mmol/L (ref 135–145)
Total Bilirubin: 0.5 mg/dL (ref 0.3–1.2)
Total Protein: 7.3 g/dL (ref 6.5–8.1)

## 2019-07-25 MED ORDER — SODIUM CHLORIDE 0.9 % IV SOLN
Freq: Once | INTRAVENOUS | Status: AC
  Filled 2019-07-25: qty 250

## 2019-07-25 MED ORDER — SODIUM CHLORIDE 0.9% FLUSH
10.0000 mL | INTRAVENOUS | Status: DC | PRN
  Administered 2019-07-25: 10 mL
  Filled 2019-07-25: qty 10

## 2019-07-25 MED ORDER — SODIUM CHLORIDE 0.9 % IV SOLN
10.0000 mg | Freq: Once | INTRAVENOUS | Status: AC
  Administered 2019-07-25: 10 mg via INTRAVENOUS
  Filled 2019-07-25: qty 10

## 2019-07-25 MED ORDER — PALONOSETRON HCL INJECTION 0.25 MG/5ML
INTRAVENOUS | Status: AC
  Filled 2019-07-25: qty 5

## 2019-07-25 MED ORDER — PALONOSETRON HCL INJECTION 0.25 MG/5ML
0.2500 mg | Freq: Once | INTRAVENOUS | Status: AC
  Administered 2019-07-25: 0.25 mg via INTRAVENOUS

## 2019-07-25 MED ORDER — HEPARIN SOD (PORK) LOCK FLUSH 100 UNIT/ML IV SOLN
500.0000 [IU] | Freq: Once | INTRAVENOUS | Status: AC | PRN
  Administered 2019-07-25: 500 [IU]
  Filled 2019-07-25: qty 5

## 2019-07-25 MED ORDER — SODIUM CHLORIDE 0.9 % IV SOLN
600.0000 mg/m2 | Freq: Once | INTRAVENOUS | Status: AC
  Administered 2019-07-25: 1500 mg via INTRAVENOUS
  Filled 2019-07-25: qty 75

## 2019-07-25 MED ORDER — SODIUM CHLORIDE 0.9 % IV SOLN
75.0000 mg/m2 | Freq: Once | INTRAVENOUS | Status: AC
  Administered 2019-07-25: 190 mg via INTRAVENOUS
  Filled 2019-07-25: qty 19

## 2019-07-25 MED ORDER — SODIUM CHLORIDE 0.9% FLUSH
10.0000 mL | INTRAVENOUS | Status: DC | PRN
  Administered 2019-07-25: 10 mL via INTRAVENOUS
  Filled 2019-07-25: qty 10

## 2019-07-25 MED ORDER — SODIUM CHLORIDE 0.9 % IV SOLN
150.0000 mg | Freq: Once | INTRAVENOUS | Status: AC
  Administered 2019-07-25: 150 mg via INTRAVENOUS
  Filled 2019-07-25: qty 150

## 2019-07-26 ENCOUNTER — Telehealth: Payer: Self-pay | Admitting: Oncology

## 2019-07-26 NOTE — Telephone Encounter (Signed)
No 5/27 los. No changes made to pt's schedule.

## 2019-07-27 ENCOUNTER — Inpatient Hospital Stay: Payer: BC Managed Care – PPO

## 2019-07-27 ENCOUNTER — Other Ambulatory Visit: Payer: Self-pay

## 2019-07-27 VITALS — BP 154/84 | HR 66 | Temp 98.6°F | Resp 18

## 2019-07-27 DIAGNOSIS — Z5111 Encounter for antineoplastic chemotherapy: Secondary | ICD-10-CM | POA: Diagnosis not present

## 2019-07-27 DIAGNOSIS — C50412 Malignant neoplasm of upper-outer quadrant of left female breast: Secondary | ICD-10-CM

## 2019-07-27 MED ORDER — PEGFILGRASTIM-JMDB 6 MG/0.6ML ~~LOC~~ SOSY
6.0000 mg | PREFILLED_SYRINGE | Freq: Once | SUBCUTANEOUS | Status: AC
  Administered 2019-07-27: 6 mg via SUBCUTANEOUS

## 2019-07-27 NOTE — Patient Instructions (Signed)

## 2019-07-28 ENCOUNTER — Other Ambulatory Visit: Payer: Self-pay | Admitting: Oncology

## 2019-07-28 DIAGNOSIS — C50412 Malignant neoplasm of upper-outer quadrant of left female breast: Secondary | ICD-10-CM

## 2019-07-28 DIAGNOSIS — Z170 Estrogen receptor positive status [ER+]: Secondary | ICD-10-CM

## 2019-08-01 ENCOUNTER — Inpatient Hospital Stay: Payer: BC Managed Care – PPO | Attending: Oncology | Admitting: Oncology

## 2019-08-01 ENCOUNTER — Other Ambulatory Visit: Payer: Self-pay

## 2019-08-01 ENCOUNTER — Inpatient Hospital Stay: Payer: BC Managed Care – PPO

## 2019-08-01 VITALS — BP 137/62 | HR 82 | Temp 98.7°F | Resp 18 | Ht 64.0 in | Wt 306.6 lb

## 2019-08-01 DIAGNOSIS — Z452 Encounter for adjustment and management of vascular access device: Secondary | ICD-10-CM | POA: Insufficient documentation

## 2019-08-01 DIAGNOSIS — Z171 Estrogen receptor negative status [ER-]: Secondary | ICD-10-CM | POA: Insufficient documentation

## 2019-08-01 DIAGNOSIS — Z170 Estrogen receptor positive status [ER+]: Secondary | ICD-10-CM

## 2019-08-01 DIAGNOSIS — C50812 Malignant neoplasm of overlapping sites of left female breast: Secondary | ICD-10-CM | POA: Insufficient documentation

## 2019-08-01 DIAGNOSIS — Z90710 Acquired absence of both cervix and uterus: Secondary | ICD-10-CM

## 2019-08-01 DIAGNOSIS — C50412 Malignant neoplasm of upper-outer quadrant of left female breast: Secondary | ICD-10-CM

## 2019-08-01 DIAGNOSIS — Z1501 Genetic susceptibility to malignant neoplasm of breast: Secondary | ICD-10-CM

## 2019-08-01 DIAGNOSIS — Z95828 Presence of other vascular implants and grafts: Secondary | ICD-10-CM

## 2019-08-01 LAB — CBC WITH DIFFERENTIAL/PLATELET
Abs Immature Granulocytes: 0.23 10*3/uL — ABNORMAL HIGH (ref 0.00–0.07)
Basophils Absolute: 0.1 10*3/uL (ref 0.0–0.1)
Basophils Relative: 2 %
Eosinophils Absolute: 0 10*3/uL (ref 0.0–0.5)
Eosinophils Relative: 1 %
HCT: 32.7 % — ABNORMAL LOW (ref 36.0–46.0)
Hemoglobin: 10.5 g/dL — ABNORMAL LOW (ref 12.0–15.0)
Immature Granulocytes: 7 %
Lymphocytes Relative: 33 %
Lymphs Abs: 1.1 10*3/uL (ref 0.7–4.0)
MCH: 28.1 pg (ref 26.0–34.0)
MCHC: 32.1 g/dL (ref 30.0–36.0)
MCV: 87.4 fL (ref 80.0–100.0)
Monocytes Absolute: 0.7 10*3/uL (ref 0.1–1.0)
Monocytes Relative: 21 %
Neutro Abs: 1.2 10*3/uL — ABNORMAL LOW (ref 1.7–7.7)
Neutrophils Relative %: 36 %
Platelets: 149 10*3/uL — ABNORMAL LOW (ref 150–400)
RBC: 3.74 MIL/uL — ABNORMAL LOW (ref 3.87–5.11)
RDW: 16.7 % — ABNORMAL HIGH (ref 11.5–15.5)
WBC: 3.2 10*3/uL — ABNORMAL LOW (ref 4.0–10.5)
nRBC: 3.1 % — ABNORMAL HIGH (ref 0.0–0.2)

## 2019-08-01 LAB — COMPREHENSIVE METABOLIC PANEL
ALT: 24 U/L (ref 0–44)
AST: 19 U/L (ref 15–41)
Albumin: 3.4 g/dL — ABNORMAL LOW (ref 3.5–5.0)
Alkaline Phosphatase: 81 U/L (ref 38–126)
Anion gap: 10 (ref 5–15)
BUN: 12 mg/dL (ref 6–20)
CO2: 31 mmol/L (ref 22–32)
Calcium: 9.2 mg/dL (ref 8.9–10.3)
Chloride: 101 mmol/L (ref 98–111)
Creatinine, Ser: 0.9 mg/dL (ref 0.44–1.00)
GFR calc Af Amer: >60 mL/min (ref >60)
GFR calc non Af Amer: >60 mL/min (ref >60)
Glucose, Bld: 115 mg/dL — ABNORMAL HIGH (ref 70–99)
Potassium: 3.4 mmol/L — ABNORMAL LOW (ref 3.5–5.1)
Sodium: 142 mmol/L (ref 135–145)
Total Bilirubin: 0.6 mg/dL (ref 0.3–1.2)
Total Protein: 6.3 g/dL — ABNORMAL LOW (ref 6.5–8.1)

## 2019-08-01 MED ORDER — SODIUM CHLORIDE 0.9% FLUSH
10.0000 mL | INTRAVENOUS | Status: DC | PRN
  Administered 2019-08-01: 10 mL
  Filled 2019-08-01: qty 10

## 2019-08-01 MED ORDER — HEPARIN SOD (PORK) LOCK FLUSH 100 UNIT/ML IV SOLN
500.0000 [IU] | Freq: Once | INTRAVENOUS | Status: AC | PRN
  Administered 2019-08-01: 500 [IU]
  Filled 2019-08-01: qty 5

## 2019-08-01 NOTE — Progress Notes (Signed)
[place]  [phone] [phone]     ID: Loretta Nelson DOB: 07-Jan-1966  MR#: [id number]  JJH#:[id number]  Patient Care Team: [name], MD as PCP - General (Family Medicine) Mauro Kaufmann, RN as Oncology Nurse Navigator Rockwell Germany, RN as Oncology Nurse Navigator Donnie Mesa, MD as Consulting Physician (General Surgery) Trask Vosler, Virgie Dad, MD as Consulting Physician (Oncology) Kyung Rudd, MD as Consulting Physician (Radiation Oncology) Delrae Rend, MD as Consulting Physician (Endocrinology) Star Age, MD as Consulting Physician (Neurology) Garvin Fila, MD as Consulting Physician (Neurology) Christophe Louis, MD as Consulting Physician (Obstetrics and Gynecology) Arta Silence, MD as Consulting Physician (Gastroenterology) Chauncey Cruel, MD OTHER MD:  CHIEF COMPLAINT: triple negative breast cancer  CURRENT TREATMENT: Adjuvant chemotherapy   INTERVAL HISTORY: Loretta Nelson returns today for follow up and treatment of her triple negative breast cancer.  She is accompanied by her sister, who is herself an 8-year breast cancer survivor  She began her adjuvant chemotherapy consisting of cyclophosphamide and docetaxel on 05/23/2019.  She completed cycle 4 on 07/25/2019.  Today is day 8 cycle 4.  She is scheduled to follow up with Dr. Lisbeth Renshaw on 08/06/2019 to discuss adjuvant radiation therapy.   REVIEW OF SYSTEMS: Loretta Nelson did much better with his final cycle than with the earlier ones.  She says she knew what she was doing and how to take care of problems.  She is still tired and occasionally has throat problems.  Her stomach was upset this morning.  Her port is working well.  She never had mouth sores.  She does have some very little energy right now and that is the main issue.  A detailed review of systems was otherwise stable   HISTORY OF CURRENT ILLNESS: From the original intake note:  Loretta Nelson had routine  screening mammography on 03/19/2019 showing a possible abnormality in the left breast. She underwent left diagnostic mammography with tomography and left breast ultrasonography at [facility] on 03/29/2019 showing: breast density category B; 8 mm mass in left breast at 12 o'clock; left axilla negative for lymphadenopathy.  Accordingly on 04/03/2019 she proceeded to biopsy of the left breast area in question. The pathology from this procedure ([id number]) showed: invasive mammary carcinoma, grade 3, e-cadherin positive. Prognostic indicators significant for: estrogen receptor, 10% positive with weak staining intensity and progesterone receptor, 0% negative. Proliferation marker Ki67 at 30%. HER2 negative by immunohistochemistry (1+).  The patient's subsequent history is as detailed below.   PAST MEDICAL HISTORY: Past Medical History:  Diagnosis Date  . Allergic rhinitis   . Anemia    low iron  . Anemia   . Anxiety   . Anxiety   . Arthritis    right knee, surgery done  . Cancer ([place]) 2021   left breast   . Complication of anesthesia    woke up during EGD  . Esophageal stricture   . Family history of breast cancer   . GERD (gastroesophageal reflux disease)   . Graves disease 01/13/11   radioactive iodine treatment, 70.2 millicuries  . Headache    tension headaches  . Hiatal hernia    small  . Hypertension   . Hyperthyroidism   . Hypothyroidism    s/p treatment  . Murmur    benign, h/o, caused by hyperdynamic contraction  . Obesity   . Pneumonia 2017   inhaler prescribed  . Sleep apnea 2021   no CPAP as yet, last study done lastnight  04/15/2019  . Tendonitis of shoulder, right   . Vitamin D deficiency   . Vitamin D deficiency     PAST SURGICAL HISTORY: Past Surgical History:  Procedure Laterality Date  . BALLOON DILATION N/A 09/07/2016   Procedure: BALLOON DILATION;  Surgeon: Arta Silence, MD;  Location: [facility] ENDOSCOPY;  Service: Endoscopy;  Laterality: N/A;  . BREAST  LUMPECTOMY WITH RADIOACTIVE SEED AND SENTINEL LYMPH NODE BIOPSY Left 04/17/2019   Procedure: LEFT BREAST LUMPECTOMY WITH RADIOACTIVE SEED AND SENTINEL LYMPH NODE BIOPSY;  Surgeon: Donnie Mesa, MD;  Location: [facility];  Service: General;  Laterality: Left;  LMA, PEC BLOCK  . CESAREAN SECTION  1999  . COLONOSCOPY    . ESOPHAGOGASTRODUODENOSCOPY    . ESOPHAGOGASTRODUODENOSCOPY (EGD) WITH PROPOFOL N/A 09/07/2016   Procedure: ESOPHAGOGASTRODUODENOSCOPY (EGD) WITH PROPOFOL;  Surgeon: Arta Silence, MD;  Location: [facility];  Service: Endoscopy;  Laterality: N/A;  . HYSTERECTOMY ABDOMINAL WITH SALPINGECTOMY Bilateral 02/17/2017   Procedure: HYSTERECTOMY ABDOMINAL WITH SALPINGECTOMY;  Surgeon: Christophe Louis, MD;  Location: [facility];  Service: Gynecology;  Laterality: Bilateral;  . KNEE ARTHROSCOPY     for torn meniscus  . PORTACATH PLACEMENT Right 05/22/2019   Procedure: INSERTION PORT-A-CATH WITH ULTRASOUND GUIDANCE;  Surgeon: Donnie Mesa, MD;  Location: [facility];  Service: General;  Laterality: Right;  Endotrachial tube  . WISDOM TOOTH EXTRACTION    . WISDOM TOOTH EXTRACTION      FAMILY HISTORY: Family History  Problem Relation Age of Onset  . Hypertension Mother   . CAD Maternal Grandfather   . Breast cancer Paternal Grandmother   . HIV Brother   . Heart attack Brother        sudden MI vs PE  . Breast cancer Sister 72  . Breast cancer Other        one dx 71s, others dx in 9s  . Colon polyps Neg Hx   . Colon cancer Neg Hx   . Liver disease Neg Hx    Patient's father was in his late 64s when he died from causes unknown to the patient.  Patient's mother is 62 years old (as of 04/2019). The patient denies a family hx of ovarian cancer. She reports breast cancer in her sister at age 68, in her paternal grandmother, and in 3 paternal cousins. She had 4 siblings, 2 brothers and 2 sisters, though only her sisters are still living.   GYNECOLOGIC HISTORY:  Patient's last menstrual period was  02/13/2017 (exact date). Menarche: 54 years old Age at first live birth: 54 years old [place] P 2 (twins) LMP 01/2017 Contraceptive: used from [id number] without complication HRT never used  Hysterectomy? Yes, 01/2017, benign pathology BSO? 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 She is divorced. She lives at home with her twin daughters, Maryjo Rochester and Edmon Crape, who are 54 years old fraternal twins and attending college remotely. Maryjo Rochester is studying music and communications at [employer].Edmon Crape is studying IT.    ADVANCED DIRECTIVES: Not in place; the patient intends to name her sister Madelon Lips as her HCPOA [phone])   HEALTH MAINTENANCE: Social History   Tobacco Use  . Smoking status: Never Smoker  . Smokeless tobacco: Never Used  Substance Use Topics  . Alcohol use: No    Alcohol/week: 0.0 standard drinks  . Drug use: No     Colonoscopy: at age 62  PAP: 02/2018  Bone density: never  done   No Known Allergies  Current Outpatient Medications  Medication Sig Dispense Refill  . amLODipine (NORVASC) 5 MG tablet Take 5 mg by mouth daily.   5  . Cholecalciferol 4000 UNITS CAPS Take 4,000 Units by mouth daily.     . ciprofloxacin (CIPRO) 500 MG tablet Take 1 tablet (500 mg total) by mouth 2 (two) times daily. Take for 5 days starting one week after a chemotherapy dose (Patient not taking: Reported on 07/25/2019) 40 tablet 0  . citalopram (CELEXA) 40 MG tablet Take 40 mg by mouth daily.    Marland Kitchen dexamethasone (DECADRON) 4 MG tablet Take 2 tablets (8 mg total) by mouth 2 (two) times daily. Start the day before Taxotere. Then again the day after chemo for 3 days. 30 tablet 1  . EDARBYCLOR 40-25 MG TABS Take 1 tablet by mouth daily.  5  . ibuprofen (ADVIL) 200 MG tablet Take 400 mg by mouth every 6 (six) hours as needed for moderate pain.    Marland Kitchen KLOR-CON M20 20 MEQ tablet Take  20 mEq by mouth 2 (two) times daily.   1  . L-Methylfolate-B12-B6-B2 (CEREFOLIN) 07-29-48-5 MG TABS Take 1 tablet by mouth every morning. 90 tablet 3  . levothyroxine (SYNTHROID) 175 MCG tablet Take 175 mcg by mouth daily before breakfast.   5  . lidocaine-prilocaine (EMLA) cream Apply to affected area once (Patient taking differently: Apply 1 application topically daily as needed (port access). ) 30 g 3  . loratadine (CLARITIN) 10 MG tablet TAKE 1 TABLET BY MOUTH EVERY DAY (Patient taking differently: Take 10 mg by mouth daily. ) 90 tablet 1  . metoprolol (TOPROL-XL) 200 MG 24 hr tablet Take 200 mg by mouth daily.   5  . ondansetron (ZOFRAN) 8 MG tablet Take 1 tablet (8 mg total) by mouth every 8 (eight) hours as needed for nausea or vomiting. Start on day 3 after chemotherapy 30 tablet 1  . pantoprazole (PROTONIX) 40 MG tablet Take 40 mg by mouth 2 (two) times daily.     No current facility-administered medications for this visit.    OBJECTIVE: African-American woman in no acute distress  Vitals:   08/01/19 1334  BP: 137/62  Pulse: 82  Resp: 18  Temp: 98.7 F (37.1 C)  SpO2: 98%     Body mass index is 52.63 kg/m.   Wt Readings from Last 3 Encounters:  08/01/19 (!) 306 lb 9.6 oz (139.1 kg)  07/25/19 (!) 311 lb 3.2 oz (141.2 kg)  07/23/19 (!) 309 lb (140.2 kg)      ECOG FS:1 - Symptomatic but completely ambulatory  Sclerae unicteric, EOMs intact Wearing a mask No cervical or supraclavicular adenopathy Lungs no rales or rhonchi Heart regular rate and rhythm Abd soft, nontender, positive bowel sounds MSK no focal spinal tenderness, no upper extremity lymphedema Neuro: nonfocal, well oriented, appropriate affect Breasts: Deferred   LAB RESULTS:  CMP     Component Value Date/Time   NA 144 07/25/2019 0938   K 3.5 07/25/2019 0938   CL 105 07/25/2019 0938   CO2 28 07/25/2019 0938   GLUCOSE 149 (H) 07/25/2019 0938   BUN 14 07/25/2019 0938   CREATININE 0.77 07/25/2019 0938     CREATININE 0.83 04/10/2019 1240   CALCIUM 9.4 07/25/2019 0938   PROT 7.3 07/25/2019 0938   ALBUMIN 3.8 07/25/2019 0938   AST 18 07/25/2019 0938   AST 17 04/10/2019 1240   ALT 21 07/25/2019 0938   ALT 19 04/10/2019  1240   ALKPHOS 70 07/25/2019 0938   BILITOT 0.5 07/25/2019 0938   BILITOT 0.4 04/10/2019 1240   GFRNONAA >60 07/25/2019 0938   GFRNONAA >60 04/10/2019 1240   GFRAA >60 07/25/2019 0938   GFRAA >60 04/10/2019 1240    No results found for: TOTALPROTELP, ALBUMINELP, A1GS, A2GS, BETS, BETA2SER, GAMS, MSPIKE, SPEI  Lab Results  Component Value Date   WBC 3.2 (L) 08/01/2019   NEUTROABS PENDING 08/01/2019   HGB 10.5 (L) 08/01/2019   HCT 32.7 (L) 08/01/2019   MCV 87.4 08/01/2019   PLT 149 (L) 08/01/2019    No results found for: LABCA2  No components found for: LZJQBH419  No results for input(s): INR in the last 168 hours.  No results found for: LABCA2  No results found for: FXT024  No results found for: OXB353  No results found for: GDJ242  No results found for: CA2729  No components found for: HGQUANT  No results found for: CEA1 / No results found for: CEA1   No results found for: AFPTUMOR  No results found for: CHROMOGRNA  No results found for: KPAFRELGTCHN, LAMBDASER, KAPLAMBRATIO (kappa/lambda light chains)  No results found for: HGBA, HGBA2QUANT, HGBFQUANT, HGBSQUAN (Hemoglobinopathy evaluation)   No results found for: LDH  No results found for: IRON, TIBC, IRONPCTSAT (Iron and TIBC)  No results found for: FERRITIN  Urinalysis No results found for: COLORURINE, APPEARANCEUR, LABSPEC, PHURINE, GLUCOSEU, HGBUR, BILIRUBINUR, KETONESUR, PROTEINUR, UROBILINOGEN, NITRITE, LEUKOCYTESUR   STUDIES: No results found.   ELIGIBLE FOR AVAILABLE RESEARCH PROTOCOL:BR003?--Depending on final surgical pathology  ASSESSMENT: 54 y.o. [place] woman status post left breast upper outer quadrant biopsy 04/03/2019 for a clinical T1b N0, stage IB invasive  ductal carcinoma, grade 3, functionally triple negative (estrogen receptor is weakly positive at 10%, progesterone receptor negative, HER-2 not amplified), with an MIB-1 of 30%  (1) status post left lumpectomy and sentinel lymph node sampling 04/17/2019 for a pT1c pN0, stage IB invasive ductal carcinoma, grade 3, triple negative, with negative margins  (a) a total of 2 sentinel lymph nodes were removed  (2) adjuvant chemotherapy consisting of cyclophosphamide and docetaxel every 21 days x 4 started 05/23/2019, completed 07/25/2019 with no dose reductions or delays  (3) adjuvant radiation to follow  (4) genetics testing 04/19/2019 through the [facility] Breast Cancer STAT Panel + Common Hereditary Cancers Panel 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 STK11, TP53, TSC1, TSC2, and VHL.  The following genes were evaluated for sequence changes only: SDHA and HOXB13 c.251G>A variant only.  (b) current data suggests an increase risk of breast and ovarian cancer in patients carrying a BARD1 mutation, but data is preliminary and insufficient for definitive recommendations  (5) consider anastrozole at the completion of radiation treatments  (6) to initiate intensified screening late in 2021   PLAN: Samhitha is done with chemotherapy.  She did remarkably well, was very motivated, and as a result she received all her treatments on time without  any dose reductions.  Her counts are adequate today so  she will not need to take 5 days of Cipro prophylactically.  She understands over the next several weeks she will start feeling stronger and I have encouraged her to start a walking program as soon as she feels able to.  I will give her a running start with radiation which also will cause fatigue.  She would like to keep the port for now.  I have put her in for a flush in 8 weeks. She will return to see me in late August and at that point we will likely start anastrozole, partly because her cancer was initially minimally estrogen positive, but more prophylactically to prevent the second cancer from developing  She is also interested in intensified screening which I think is appropriate in this case.  I will set her up for breast MRI late this year and mammogram 6 months after that  Total encounter time 25 minutes.Sarajane Jews C. Allyn Bertoni, MD 08/01/19 1:48 PM Medical Oncology and Hematology [facility] [place], [place] [address] Tel. [phone]    Fax. [phone]  . I, Wilburn Mylar, am acting as scribe for Dr. Sarajane Jews C. Yoandri Congrove.  I, Lurline Del MD, have reviewed the above documentation for accuracy and completeness, and I agree with the above.    *Total Encounter Time as defined by the Centers for Medicare and Medicaid Services includes, in addition to the face-to-face time of a patient visit (documented in the note above) non-face-to-face time: obtaining and reviewing outside history, ordering and reviewing medications, tests or procedures, care coordination (communications with other health care professionals or caregivers) and documentation in the medical record.

## 2019-08-02 ENCOUNTER — Telehealth: Payer: Self-pay | Admitting: Oncology

## 2019-08-02 NOTE — Telephone Encounter (Signed)
Scheduled appts per 6/3 los. Pt's voicemail box was full. Will send reminder letter and calendar.

## 2019-08-05 NOTE — Progress Notes (Signed)
Location of Breast Cancer: Malignant neoplasm of upper-outer quadrant of left breast  Did patient present with symptoms (if so, please note symptoms) or was this found on screening mammography?:  Screening mammogram 03/19/2019 showed possible abnormality in the left breast.  Diagnostic mammogram 03/29/2019: Breast density category B; 8 mm mass in the left breast at 12 o'clock; left axilla negative for lymphadenopathy.  Histology per Pathology Report: Left Breast Lumpectomy 04/17/2019   Receptor Status: ER(-), PR (-), Her2-neu (-), Ki-()    Past/Anticipated interventions by surgeon, if any: -Lumpectomy and sentinel 04/17/2019 -pT1c pN0, stage IB invasive ductal carcinoma, grade 3, triple negative, with negative margins             (a) a total of 2 sentinel lymph nodes were removed  Past/Anticipated interventions by medical oncology, if any: Chemotherapy  Dr. Jana Hakim 08/01/2019 [place] is done with chemotherapy.  She did remarkably well, was very motivated, and as a result she received all her treatments on time without any dose reductions - She will return to see me in late August and at that point we will likely start anastrozole, partly because her cancer was initially minimally estrogen positive, but more prophylactically to prevent the second cancer from developing She is also interested in intensified screening which I think is appropriate in this case.  I will set her up for breast MRI late this year and mammogram 6 months after that  Treatment plan-adjuvant chemotherapy consisting of cyclophosphamide and docetaxel every 21 days x 4 started 05/23/2019, completed 07/25/2019 with no dose reductions or delays (3) adjuvant radiation to follow    Lymphedema issues, if any:  No    Pain issues, if any: Occasional sharp pain in the breast.  SAFETY ISSUES:  Prior radiation? No  Pacemaker/ICD? No  Possible current pregnancy? Hysterectomy  Is the patient on methotrexate? 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

## 2019-08-06 ENCOUNTER — Other Ambulatory Visit: Payer: Self-pay

## 2019-08-06 ENCOUNTER — Encounter: Payer: Self-pay | Admitting: Radiation Oncology

## 2019-08-06 ENCOUNTER — Ambulatory Visit
Admission: RE | Admit: 2019-08-06 | Discharge: 2019-08-06 | Disposition: A | Discharge status: Home / Self Care | Payer: BC Managed Care – PPO | Source: Ambulatory Visit | Attending: Radiation Oncology | Admitting: Radiation Oncology

## 2019-08-06 VITALS — BP 145/83 | HR 68 | Temp 97.4°F | Resp 18 | Ht 64.0 in | Wt 306.2 lb

## 2019-08-06 DIAGNOSIS — Z170 Estrogen receptor positive status [ER+]: Secondary | ICD-10-CM | POA: Diagnosis not present

## 2019-08-06 DIAGNOSIS — C50412 Malignant neoplasm of upper-outer quadrant of left female breast: Secondary | ICD-10-CM | POA: Insufficient documentation

## 2019-08-06 DIAGNOSIS — Z9221 Personal history of antineoplastic chemotherapy: Secondary | ICD-10-CM | POA: Insufficient documentation

## 2019-08-06 DIAGNOSIS — Z79899 Other long term (current) drug therapy: Secondary | ICD-10-CM | POA: Insufficient documentation

## 2019-08-06 DIAGNOSIS — Z923 Personal history of irradiation: Secondary | ICD-10-CM | POA: Insufficient documentation

## 2019-08-06 DIAGNOSIS — Z510 Encounter for antineoplastic radiation therapy: Secondary | ICD-10-CM | POA: Insufficient documentation

## 2019-08-06 NOTE — Progress Notes (Signed)
Radiation Oncology         [phone] ________________________________  Name: Loretta Nelson MRN: [id number]  Date: 08/06/2019  DOB: 11-Feb-1966  Follow-Up Visit Note  CC: [name], MD  [name], MD  Diagnosis:    Cancer Staging Malignant neoplasm of upper-outer quadrant of left breast in female, estrogen receptor positive [facility]) Staging form: Breast, AJCC 8th Edition - Clinical stage from 04/10/2019: Stage IB (cT1b, cN0, cM0, G3, ER+, PR-, HER2-) - Unsigned - Pathologic stage from 04/17/2019: Stage IB (pT1c, pN0, cM0, G3, ER-, PR-, HER2-) - Signed by Gardenia Phlegm, NP on 05/01/2019   Narrative:  The patient returns today for follow-up.  She has completed chemotherapy and now presents for further discussion and coordination of an anticipated course of radiation treatment. She states that, overall, she is doing fairly well.  She tolerated chemotherapy although she states that she had some issues as expected going through this. Overall she is very pleased with where she is and is proceed with the next step. 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                              ALLERGIES:  has No Known Allergies.  Meds: Current Outpatient Medications  Medication Sig Dispense Refill  . amLODipine (NORVASC) 5 MG tablet Take 5 mg by mouth daily.   5  . Cholecalciferol 4000 UNITS CAPS Take 4,000 Units by mouth daily.     . citalopram (CELEXA) 40 MG tablet Take 40 mg by mouth daily.    Marland Kitchen EDARBYCLOR 40-25 MG TABS Take 1 tablet by mouth daily.  5  . ibuprofen (ADVIL) 200 MG tablet Take 400 mg by mouth every 6 (six) hours as needed for moderate pain.    Marland Kitchen KLOR-CON M20 20 MEQ tablet Take 20 mEq by mouth 2 (two) times daily.   1  . L-Methylfolate-B12-B6-B2 (CEREFOLIN) 07-29-48-5 MG TABS Take 1 tablet by mouth every morning. 90 tablet 3  . levothyroxine (SYNTHROID) 175 MCG tablet Take 175 mcg  by mouth daily before breakfast.   5  . lidocaine-prilocaine (EMLA) cream Apply to affected area once 30 g 3  . loratadine (CLARITIN) 10 MG tablet TAKE 1 TABLET BY MOUTH EVERY DAY (Patient taking differently: Take 10 mg by mouth daily. ) 90 tablet 1  . metoprolol (TOPROL-XL) 200 MG 24 hr tablet Take 200 mg by mouth daily.   5  . pantoprazole (PROTONIX) 40 MG tablet Take 40 mg by mouth 2 (two) times daily.    . ciprofloxacin (CIPRO) 500 MG tablet Take 1 tablet (500 mg total) by mouth 2 (two) times daily. Take for 5 days starting one week after a chemotherapy dose (Patient not taking: Reported on 07/25/2019) 40 tablet 0  . dexamethasone (DECADRON) 4 MG tablet Take 2 tablets (8 mg total) by mouth 2 (two) times daily. Start the day before Taxotere. Then again the day after chemo for 3 days. (Patient not taking: Reported on 08/06/2019) 30 tablet 1  . ondansetron (ZOFRAN) 8 MG tablet Take 1 tablet (8 mg total) by mouth every 8 (eight) hours as needed for nausea or vomiting. Start on day 3 after chemotherapy (Patient not taking: Reported on 08/06/2019) 30 tablet 1   No current facility-administered medications for this encounter.    Physical Findings: The patient is in  no acute distress. Patient is alert and oriented.  height is '5\' 4"'  (1.626 m) and weight is 306 lb 4 oz (138.9 kg) (abnormal). Her temporal temperature is 97.4 F (36.3 C) (abnormal). Her blood pressure is 145/83 (abnormal) and her pulse is 68. Her respiration is 18 and oxygen saturation is 100%. .     Lab Findings: Lab Results  Component Value Date   WBC 3.2 (L) 08/01/2019   HGB 10.5 (L) 08/01/2019   HCT 32.7 (L) 08/01/2019   MCV 87.4 08/01/2019   PLT 149 (L) 08/01/2019     Radiographic Findings: No results found.  Impression:    The patient has completed surgery and adjuvant chemotherapy. She is suitable to proceed with adjuvant radiotherapy at this time.  I once again discussed the rationale for radiation treatment in this  setting. We discussed the potential benefit of radiation treatment as well as the possible side effects and risks. All of her questions were answered.  The patient wishes to proceed with radiation treatment.  Plan:  The patient will be scheduled for a simulation in the near future such that we can proceed with radiation treatment planning. I anticipate treating the patient to the left breast for approximately 6 1/2 weeks using whole breast tangent fields. The patient will undergo simulation today for treatment planning and we will plan to begin her course of radiation treatment next week.  The patient was seen today for Followup. In total 30 minutes were spent on the patient's visit today including medical records review, face-to-face visit, and coordination of care for her anticipated course of radiation treatment.Marland Kitchen  ------------------------------------------------  Jodelle Gross, MD, PhD

## 2019-08-09 DIAGNOSIS — C50412 Malignant neoplasm of upper-outer quadrant of left female breast: Secondary | ICD-10-CM | POA: Diagnosis not present

## 2019-08-12 ENCOUNTER — Encounter: Payer: Self-pay | Admitting: Radiation Oncology

## 2019-08-12 ENCOUNTER — Encounter: Payer: Self-pay | Admitting: *Deleted

## 2019-08-13 ENCOUNTER — Other Ambulatory Visit: Payer: Self-pay

## 2019-08-13 ENCOUNTER — Ambulatory Visit
Admission: RE | Admit: 2019-08-13 | Discharge: 2019-08-13 | Disposition: A | Discharge status: Home / Self Care | Payer: BC Managed Care – PPO | Source: Ambulatory Visit | Attending: Radiation Oncology | Admitting: Radiation Oncology

## 2019-08-13 DIAGNOSIS — C50412 Malignant neoplasm of upper-outer quadrant of left female breast: Secondary | ICD-10-CM | POA: Diagnosis not present

## 2019-08-14 ENCOUNTER — Ambulatory Visit
Admission: RE | Admit: 2019-08-14 | Discharge: 2019-08-14 | Disposition: A | Discharge status: Home / Self Care | Payer: BC Managed Care – PPO | Source: Ambulatory Visit | Attending: Radiation Oncology | Admitting: Radiation Oncology

## 2019-08-14 ENCOUNTER — Other Ambulatory Visit: Payer: Self-pay

## 2019-08-14 DIAGNOSIS — C50412 Malignant neoplasm of upper-outer quadrant of left female breast: Secondary | ICD-10-CM | POA: Diagnosis not present

## 2019-08-15 ENCOUNTER — Other Ambulatory Visit: Payer: Self-pay

## 2019-08-15 ENCOUNTER — Ambulatory Visit
Admission: RE | Admit: 2019-08-15 | Discharge: 2019-08-15 | Disposition: A | Discharge status: Home / Self Care | Payer: BC Managed Care – PPO | Source: Ambulatory Visit | Attending: Radiation Oncology | Admitting: Radiation Oncology

## 2019-08-15 DIAGNOSIS — C50412 Malignant neoplasm of upper-outer quadrant of left female breast: Secondary | ICD-10-CM | POA: Diagnosis not present

## 2019-08-15 NOTE — Progress Notes (Signed)
Pt here for patient teaching.  Pt given Radiation and You booklet and skin care instructions.  Reviewed areas of pertinence such as fatigue, hair loss, skin changes, breast tenderness and breast swelling . Pt able to give teach back of to pat skin and use unscented/gentle soap,avoid applying anything to skin within 4 hours of treatment, avoid wearing an under wire bra and to use an electric razor if they must shave. Pt verbalizes understanding of information given and will contact nursing with any questions or concerns.     LaToya M. Silva RN, BSN      

## 2019-08-16 ENCOUNTER — Ambulatory Visit
Admission: RE | Admit: 2019-08-16 | Discharge: 2019-08-16 | Disposition: A | Discharge status: Home / Self Care | Payer: BC Managed Care – PPO | Source: Ambulatory Visit | Attending: Radiation Oncology | Admitting: Radiation Oncology

## 2019-08-16 ENCOUNTER — Other Ambulatory Visit: Payer: Self-pay

## 2019-08-16 DIAGNOSIS — Z170 Estrogen receptor positive status [ER+]: Secondary | ICD-10-CM

## 2019-08-16 DIAGNOSIS — C50412 Malignant neoplasm of upper-outer quadrant of left female breast: Secondary | ICD-10-CM

## 2019-08-16 MED ORDER — SONAFINE EX EMUL
1.0000 "application " | Freq: Two times a day (BID) | CUTANEOUS | Status: DC
  Administered 2019-08-16: 1 via TOPICAL

## 2019-08-16 MED ORDER — ALRA NON-METALLIC DEODORANT (RAD-ONC)
1.0000 "application " | Freq: Once | TOPICAL | Status: AC
  Administered 2019-08-16: 1 via TOPICAL

## 2019-08-19 ENCOUNTER — Ambulatory Visit
Admission: RE | Admit: 2019-08-19 | Discharge: 2019-08-19 | Disposition: A | Discharge status: Home / Self Care | Payer: BC Managed Care – PPO | Source: Ambulatory Visit | Attending: Radiation Oncology | Admitting: Radiation Oncology

## 2019-08-19 ENCOUNTER — Other Ambulatory Visit: Payer: Self-pay

## 2019-08-19 DIAGNOSIS — C50412 Malignant neoplasm of upper-outer quadrant of left female breast: Secondary | ICD-10-CM | POA: Diagnosis not present

## 2019-08-20 ENCOUNTER — Ambulatory Visit
Admission: RE | Admit: 2019-08-20 | Discharge: 2019-08-20 | Disposition: A | Discharge status: Home / Self Care | Payer: BC Managed Care – PPO | Source: Ambulatory Visit | Attending: Radiation Oncology | Admitting: Radiation Oncology

## 2019-08-20 ENCOUNTER — Other Ambulatory Visit: Payer: Self-pay

## 2019-08-20 DIAGNOSIS — C50412 Malignant neoplasm of upper-outer quadrant of left female breast: Secondary | ICD-10-CM | POA: Diagnosis not present

## 2019-08-21 ENCOUNTER — Ambulatory Visit
Admission: RE | Admit: 2019-08-21 | Discharge: 2019-08-21 | Disposition: A | Discharge status: Home / Self Care | Payer: BC Managed Care – PPO | Source: Ambulatory Visit | Attending: Radiation Oncology | Admitting: Radiation Oncology

## 2019-08-21 ENCOUNTER — Other Ambulatory Visit: Payer: Self-pay

## 2019-08-21 DIAGNOSIS — C50412 Malignant neoplasm of upper-outer quadrant of left female breast: Secondary | ICD-10-CM | POA: Diagnosis not present

## 2019-08-22 ENCOUNTER — Ambulatory Visit
Admission: RE | Admit: 2019-08-22 | Discharge: 2019-08-22 | Disposition: A | Discharge status: Home / Self Care | Payer: BC Managed Care – PPO | Source: Ambulatory Visit | Attending: Radiation Oncology | Admitting: Radiation Oncology

## 2019-08-22 ENCOUNTER — Other Ambulatory Visit: Payer: Self-pay

## 2019-08-22 DIAGNOSIS — C50412 Malignant neoplasm of upper-outer quadrant of left female breast: Secondary | ICD-10-CM | POA: Diagnosis not present

## 2019-08-23 ENCOUNTER — Ambulatory Visit
Admission: RE | Admit: 2019-08-23 | Discharge: 2019-08-23 | Disposition: A | Discharge status: Home / Self Care | Payer: BC Managed Care – PPO | Source: Ambulatory Visit | Attending: Radiation Oncology | Admitting: Radiation Oncology

## 2019-08-23 ENCOUNTER — Other Ambulatory Visit: Payer: Self-pay

## 2019-08-23 DIAGNOSIS — C50412 Malignant neoplasm of upper-outer quadrant of left female breast: Secondary | ICD-10-CM | POA: Diagnosis not present

## 2019-08-26 ENCOUNTER — Ambulatory Visit
Admission: RE | Admit: 2019-08-26 | Discharge: 2019-08-26 | Disposition: A | Discharge status: Home / Self Care | Payer: BC Managed Care – PPO | Source: Ambulatory Visit | Attending: Radiation Oncology | Admitting: Radiation Oncology

## 2019-08-26 ENCOUNTER — Ambulatory Visit: Payer: BC Managed Care – PPO | Attending: Oncology

## 2019-08-26 ENCOUNTER — Encounter: Payer: Self-pay | Admitting: Physical Therapy

## 2019-08-26 ENCOUNTER — Other Ambulatory Visit: Payer: Self-pay

## 2019-08-26 DIAGNOSIS — Z170 Estrogen receptor positive status [ER+]: Secondary | ICD-10-CM | POA: Insufficient documentation

## 2019-08-26 DIAGNOSIS — C50412 Malignant neoplasm of upper-outer quadrant of left female breast: Secondary | ICD-10-CM | POA: Insufficient documentation

## 2019-08-26 NOTE — Therapy (Signed)
[place], [place], [address] Phone: [phone]   Fax:  [phone]  Physical Therapy Treatment  Patient Details  Name: Loretta Nelson MRN: [id number] Date of Birth: 09-Oct-1965 Referring Provider (PT): Dr. Donnie Mesa   Encounter Date: 08/26/2019   PT End of Session - 08/26/19 1224    Visit Number 2   no # change due to screen only   Number of Visits 2    PT Start Time 0857    PT Stop Time 0910    PT Time Calculation (min) 13 min           Past Medical History:  Diagnosis Date  . Allergic rhinitis   . Anemia    low iron  . Anemia   . Anxiety   . Anxiety   . Arthritis    right knee, surgery done  . Cancer ([place]) 2021   left breast   . Complication of anesthesia    woke up during EGD  . Esophageal stricture   . Family history of breast cancer   . GERD (gastroesophageal reflux disease)   . Graves disease 01/13/11   radioactive iodine treatment, 29.4 millicuries  . Headache    tension headaches  . Hiatal hernia    small  . Hypertension   . Hyperthyroidism   . Hypothyroidism    s/p treatment  . Murmur    benign, h/o, caused by hyperdynamic contraction  . Obesity   . Pneumonia 2017   inhaler prescribed  . Sleep apnea 2021   no CPAP as yet, last study done lastnight 04/15/2019  . Tendonitis of shoulder, right   . Vitamin D deficiency   . Vitamin D deficiency     Past Surgical History:  Procedure Laterality Date  . BALLOON DILATION N/A 09/07/2016   Procedure: BALLOON DILATION;  Surgeon: Arta Silence, MD;  Location: [facility];  Service: Endoscopy;  Laterality: N/A;  . BREAST LUMPECTOMY WITH RADIOACTIVE SEED AND SENTINEL LYMPH NODE BIOPSY Left 04/17/2019   Procedure: LEFT BREAST LUMPECTOMY WITH RADIOACTIVE SEED AND SENTINEL LYMPH NODE BIOPSY;  Surgeon: Donnie Mesa, MD;  Location: [facility];  Service: General;  Laterality: Left;  LMA, PEC BLOCK  . CESAREAN SECTION  1999  .  COLONOSCOPY    . ESOPHAGOGASTRODUODENOSCOPY    . ESOPHAGOGASTRODUODENOSCOPY (EGD) WITH PROPOFOL N/A 09/07/2016   Procedure: ESOPHAGOGASTRODUODENOSCOPY (EGD) WITH PROPOFOL;  Surgeon: Arta Silence, MD;  Location: [facility];  Service: Endoscopy;  Laterality: N/A;  . HYSTERECTOMY ABDOMINAL WITH SALPINGECTOMY Bilateral 02/17/2017   Procedure: HYSTERECTOMY ABDOMINAL WITH SALPINGECTOMY;  Surgeon: Christophe Louis, MD;  Location: [facility];  Service: Gynecology;  Laterality: Bilateral;  . KNEE ARTHROSCOPY     for torn meniscus  . PORTACATH PLACEMENT Right 05/22/2019   Procedure: INSERTION PORT-A-CATH WITH ULTRASOUND GUIDANCE;  Surgeon: Donnie Mesa, MD;  Location: [facility];  Service: General;  Laterality: Right;  Endotrachial tube  . WISDOM TOOTH EXTRACTION    . WISDOM TOOTH EXTRACTION      There were no vitals filed for this visit.   Subjective Assessment - 08/26/19 1224    Subjective Pt returns for L-Dex screen                  L-DEX FLOWSHEETS - 08/26/19 0900      L-DEX LYMPHEDEMA SCREENING   Measurement Type Unilateral    L-DEX MEASUREMENT EXTREMITY Upper Extremity    POSITION  Standing    DOMINANT SIDE Right  At Risk Side Left    BASELINE SCORE (UNILATERAL) 4.3    L-DEX SCORE (UNILATERAL) -1.8    VALUE CHANGE (UNILAT) -6.1                                  PT Long Term Goals - 05/13/19 1735      PT LONG TERM GOAL #1   Title Patient will report >/= 25% reduction in right scapular and neck pain to tolerate working on her computer with greater ease.    Time 8    Period Weeks    Status Not Met      PT LONG TERM GOAL #2   Title Patient will increase right shoulder internal rotation ROM to >/= 55 degrees for increased ease reaching behind back.    Baseline 32 degrees    Time 8    Period Weeks    Status Not Met      PT LONG TERM GOAL #3   Title Patient will demonstrate and verbalize proper sitting posture for decreased strain on neck and scapula  while working on her computer.    Time 8    Period Weeks    Status Not Met      PT LONG TERM GOAL #4   Title Patient will improve her DASH score to be </= 8 for increased function of right upper extremity.    Time 8    Period Weeks    Status Not Met      PT LONG TERM GOAL #5   Title Patient will demonstrate she has regained left shoulder ROM and function post operatively compared to baselines.    Time 8    Period Weeks    Status Achieved                 Plan - 08/26/19 1225    Clinical Impression Statement L-dex screen within normal range though close to range at -6.1 point change (>6.5 is subclinical lymphedema. Pt reports having increased fluid retention since starting steroids so increased weight gain.  Instructed pt to watch limb closely and call us sooner if changes noted like heaviness, achiness or visible sweling. She verbalized understanding.    PT Next Visit Plan Cont L-Dex screening in 3 months and will contact us prn sooner if Rt breast or UE edema increase, especially since she is currently undergoing radiation.    Consulted and Agree with Plan of Care Patient           Patient will benefit from skilled therapeutic intervention in order to improve the following deficits and impairments:     Visit Diagnosis: Malignant neoplasm of upper-outer quadrant of left breast in female, estrogen receptor positive ([place])     Problem List Patient Active Problem List   Diagnosis Date Noted  . Port-A-Cath in place 08/01/2019  . Morbid obesity with body mass index of 50 or higher ([place]) 06/20/2019  . Genetic testing 04/19/2019  . 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 Family history of breast cancer   . Malignant neoplasm of upper-outer quadrant of left breast in female, estrogen receptor positive ([place]) 04/05/2019  . Flat foot 03/12/2018  . 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 Fibroids, intramural 02/17/2017  . Menorrhagia 02/17/2017  . S/P  hysterectomy 02/17/2017    Otelia Limes, PTA 08/26/2019, 12:30 PM  [place], [place],  [address] Phone: [phone]   Fax:  [phone]  Name: Loretta Nelson MRN: [id number] Date of Birth: 1965/06/04

## 2019-08-27 ENCOUNTER — Other Ambulatory Visit: Payer: Self-pay

## 2019-08-27 ENCOUNTER — Ambulatory Visit
Admission: RE | Admit: 2019-08-27 | Discharge: 2019-08-27 | Disposition: A | Discharge status: Home / Self Care | Payer: BC Managed Care – PPO | Source: Ambulatory Visit | Attending: Radiation Oncology | Admitting: Radiation Oncology

## 2019-08-27 DIAGNOSIS — C50412 Malignant neoplasm of upper-outer quadrant of left female breast: Secondary | ICD-10-CM | POA: Diagnosis not present

## 2019-08-28 ENCOUNTER — Other Ambulatory Visit: Payer: Self-pay

## 2019-08-28 ENCOUNTER — Ambulatory Visit
Admission: RE | Admit: 2019-08-28 | Discharge: 2019-08-28 | Disposition: A | Discharge status: Home / Self Care | Payer: BC Managed Care – PPO | Source: Ambulatory Visit | Attending: Radiation Oncology | Admitting: Radiation Oncology

## 2019-08-28 DIAGNOSIS — C50412 Malignant neoplasm of upper-outer quadrant of left female breast: Secondary | ICD-10-CM | POA: Diagnosis not present

## 2019-08-29 ENCOUNTER — Other Ambulatory Visit: Payer: Self-pay

## 2019-08-29 ENCOUNTER — Ambulatory Visit
Admission: RE | Admit: 2019-08-29 | Discharge: 2019-08-29 | Disposition: A | Discharge status: Home / Self Care | Payer: BC Managed Care – PPO | Source: Ambulatory Visit | Attending: Radiation Oncology | Admitting: Radiation Oncology

## 2019-08-29 DIAGNOSIS — Z170 Estrogen receptor positive status [ER+]: Secondary | ICD-10-CM | POA: Insufficient documentation

## 2019-08-29 DIAGNOSIS — Z510 Encounter for antineoplastic radiation therapy: Secondary | ICD-10-CM | POA: Diagnosis present

## 2019-08-29 DIAGNOSIS — C50412 Malignant neoplasm of upper-outer quadrant of left female breast: Secondary | ICD-10-CM | POA: Insufficient documentation

## 2019-08-29 MED ORDER — SONAFINE EX EMUL
1.0000 "application " | Freq: Once | CUTANEOUS | Status: AC
  Administered 2019-08-29: 1 via TOPICAL

## 2019-08-30 ENCOUNTER — Other Ambulatory Visit: Payer: Self-pay

## 2019-08-30 ENCOUNTER — Ambulatory Visit
Admission: RE | Admit: 2019-08-30 | Discharge: 2019-08-30 | Disposition: A | Discharge status: Home / Self Care | Payer: BC Managed Care – PPO | Source: Ambulatory Visit | Attending: Radiation Oncology | Admitting: Radiation Oncology

## 2019-08-30 DIAGNOSIS — C50412 Malignant neoplasm of upper-outer quadrant of left female breast: Secondary | ICD-10-CM | POA: Diagnosis not present

## 2019-09-03 ENCOUNTER — Other Ambulatory Visit: Payer: Self-pay

## 2019-09-03 ENCOUNTER — Ambulatory Visit
Admission: RE | Admit: 2019-09-03 | Discharge: 2019-09-03 | Disposition: A | Discharge status: Home / Self Care | Payer: BC Managed Care – PPO | Source: Ambulatory Visit | Attending: Radiation Oncology | Admitting: Radiation Oncology

## 2019-09-03 DIAGNOSIS — C50412 Malignant neoplasm of upper-outer quadrant of left female breast: Secondary | ICD-10-CM | POA: Diagnosis not present

## 2019-09-04 ENCOUNTER — Ambulatory Visit
Admission: RE | Admit: 2019-09-04 | Discharge: 2019-09-04 | Disposition: A | Discharge status: Home / Self Care | Payer: BC Managed Care – PPO | Source: Ambulatory Visit | Attending: Radiation Oncology | Admitting: Radiation Oncology

## 2019-09-04 ENCOUNTER — Other Ambulatory Visit: Payer: Self-pay

## 2019-09-04 DIAGNOSIS — C50412 Malignant neoplasm of upper-outer quadrant of left female breast: Secondary | ICD-10-CM | POA: Diagnosis not present

## 2019-09-05 ENCOUNTER — Ambulatory Visit
Admission: RE | Admit: 2019-09-05 | Discharge: 2019-09-05 | Disposition: A | Discharge status: Home / Self Care | Payer: BC Managed Care – PPO | Source: Ambulatory Visit | Attending: Radiation Oncology | Admitting: Radiation Oncology

## 2019-09-05 ENCOUNTER — Other Ambulatory Visit: Payer: Self-pay

## 2019-09-05 DIAGNOSIS — C50412 Malignant neoplasm of upper-outer quadrant of left female breast: Secondary | ICD-10-CM | POA: Diagnosis not present

## 2019-09-06 ENCOUNTER — Other Ambulatory Visit: Payer: Self-pay

## 2019-09-06 ENCOUNTER — Ambulatory Visit
Admission: RE | Admit: 2019-09-06 | Discharge: 2019-09-06 | Disposition: A | Discharge status: Home / Self Care | Payer: BC Managed Care – PPO | Source: Ambulatory Visit | Attending: Radiation Oncology | Admitting: Radiation Oncology

## 2019-09-06 DIAGNOSIS — C50412 Malignant neoplasm of upper-outer quadrant of left female breast: Secondary | ICD-10-CM | POA: Diagnosis not present

## 2019-09-06 DIAGNOSIS — Z170 Estrogen receptor positive status [ER+]: Secondary | ICD-10-CM

## 2019-09-06 MED ORDER — SONAFINE EX EMUL
1.0000 "application " | Freq: Once | CUTANEOUS | Status: AC
  Administered 2019-09-06: 1 via TOPICAL

## 2019-09-09 ENCOUNTER — Other Ambulatory Visit: Payer: Self-pay

## 2019-09-09 ENCOUNTER — Ambulatory Visit
Admission: RE | Admit: 2019-09-09 | Discharge: 2019-09-09 | Disposition: A | Discharge status: Home / Self Care | Payer: BC Managed Care – PPO | Source: Ambulatory Visit | Attending: Radiation Oncology | Admitting: Radiation Oncology

## 2019-09-09 DIAGNOSIS — C50412 Malignant neoplasm of upper-outer quadrant of left female breast: Secondary | ICD-10-CM | POA: Diagnosis not present

## 2019-09-10 ENCOUNTER — Ambulatory Visit
Admission: RE | Admit: 2019-09-10 | Discharge: 2019-09-10 | Disposition: A | Discharge status: Home / Self Care | Payer: BC Managed Care – PPO | Source: Ambulatory Visit | Attending: Radiation Oncology | Admitting: Radiation Oncology

## 2019-09-10 ENCOUNTER — Other Ambulatory Visit: Payer: Self-pay

## 2019-09-10 DIAGNOSIS — C50412 Malignant neoplasm of upper-outer quadrant of left female breast: Secondary | ICD-10-CM | POA: Diagnosis not present

## 2019-09-11 ENCOUNTER — Other Ambulatory Visit: Payer: Self-pay | Admitting: Radiation Oncology

## 2019-09-11 ENCOUNTER — Other Ambulatory Visit: Payer: Self-pay

## 2019-09-11 ENCOUNTER — Ambulatory Visit
Admission: RE | Admit: 2019-09-11 | Discharge: 2019-09-11 | Disposition: A | Discharge status: Home / Self Care | Payer: BC Managed Care – PPO | Source: Ambulatory Visit | Attending: Radiation Oncology | Admitting: Radiation Oncology

## 2019-09-11 DIAGNOSIS — Z170 Estrogen receptor positive status [ER+]: Secondary | ICD-10-CM

## 2019-09-11 DIAGNOSIS — C50412 Malignant neoplasm of upper-outer quadrant of left female breast: Secondary | ICD-10-CM

## 2019-09-11 NOTE — Progress Notes (Signed)
The patient has received 21/33 fractions to the left breast for her breast cancer, and has noticed some increased tightening and discomfort in the axilla and tail of the left breast. On examination today during her PUT visit with Dr. Tammi Klippel (in Dr. Ida Rogue stead), she has what I believe to be early cording in the left axilla. She has induration deep to her axillary incision region as well, and we discussed re-evaluation with PT. She is in agreement and this will be coordinated in the next day or two.    Carola Rhine, PAC

## 2019-09-12 ENCOUNTER — Ambulatory Visit
Admission: RE | Admit: 2019-09-12 | Discharge: 2019-09-12 | Disposition: A | Discharge status: Home / Self Care | Payer: BC Managed Care – PPO | Source: Ambulatory Visit | Attending: Radiation Oncology | Admitting: Radiation Oncology

## 2019-09-12 ENCOUNTER — Other Ambulatory Visit: Payer: Self-pay

## 2019-09-12 DIAGNOSIS — C50412 Malignant neoplasm of upper-outer quadrant of left female breast: Secondary | ICD-10-CM | POA: Diagnosis not present

## 2019-09-13 ENCOUNTER — Other Ambulatory Visit: Payer: Self-pay

## 2019-09-13 ENCOUNTER — Ambulatory Visit
Admission: RE | Admit: 2019-09-13 | Discharge: 2019-09-13 | Disposition: A | Discharge status: Home / Self Care | Payer: BC Managed Care – PPO | Source: Ambulatory Visit | Attending: Radiation Oncology | Admitting: Radiation Oncology

## 2019-09-13 DIAGNOSIS — C50412 Malignant neoplasm of upper-outer quadrant of left female breast: Secondary | ICD-10-CM | POA: Diagnosis not present

## 2019-09-16 ENCOUNTER — Ambulatory Visit
Admission: RE | Admit: 2019-09-16 | Discharge: 2019-09-16 | Disposition: A | Discharge status: Home / Self Care | Payer: BC Managed Care – PPO | Source: Ambulatory Visit | Attending: Radiation Oncology | Admitting: Radiation Oncology

## 2019-09-16 ENCOUNTER — Other Ambulatory Visit: Payer: Self-pay

## 2019-09-16 DIAGNOSIS — C50412 Malignant neoplasm of upper-outer quadrant of left female breast: Secondary | ICD-10-CM | POA: Diagnosis not present

## 2019-09-17 ENCOUNTER — Ambulatory Visit
Admission: RE | Admit: 2019-09-17 | Discharge: 2019-09-17 | Disposition: A | Discharge status: Home / Self Care | Payer: BC Managed Care – PPO | Source: Ambulatory Visit | Attending: Radiation Oncology | Admitting: Radiation Oncology

## 2019-09-17 ENCOUNTER — Ambulatory Visit: Payer: BC Managed Care – PPO | Attending: Oncology | Admitting: Physical Therapy

## 2019-09-17 ENCOUNTER — Other Ambulatory Visit: Payer: Self-pay

## 2019-09-17 DIAGNOSIS — L599 Disorder of the skin and subcutaneous tissue related to radiation, unspecified: Secondary | ICD-10-CM | POA: Insufficient documentation

## 2019-09-17 DIAGNOSIS — I890 Lymphedema, not elsewhere classified: Secondary | ICD-10-CM | POA: Diagnosis present

## 2019-09-17 DIAGNOSIS — R293 Abnormal posture: Secondary | ICD-10-CM

## 2019-09-17 DIAGNOSIS — C50412 Malignant neoplasm of upper-outer quadrant of left female breast: Secondary | ICD-10-CM | POA: Diagnosis not present

## 2019-09-17 DIAGNOSIS — M25551 Pain in right hip: Secondary | ICD-10-CM | POA: Diagnosis present

## 2019-09-17 NOTE — Therapy (Signed)
Loretta Nelson, [place], [address] Phone: [phone]   Fax:  [phone]  Physical Therapy Re- Evaluation  Patient Details  Name: Loretta Nelson MRN: [id number] Date of Birth: October 03, 1965 Referring Provider (PT): Loretta Nelson,  Loretta Nelson    Encounter Date: 09/17/2019   PT End of Session - 09/17/19 2024    Visit Number 3    Number of Visits 18    Date for PT Re-Evaluation 11/18/19    PT Start Time 1300    PT Stop Time 1345    PT Time Calculation (min) 45 min    Activity Tolerance Patient tolerated treatment well    Behavior During Therapy [facility] for tasks assessed/performed           Past Medical History:  Diagnosis Date  . Allergic rhinitis   . Anemia    low iron  . Anemia   . Anxiety   . Anxiety   . Arthritis    right knee, surgery done  . Cancer (Loretta Nelson) 2021   left breast   . Complication of anesthesia    woke up during EGD  . Esophageal stricture   . Family history of breast cancer   . GERD (gastroesophageal reflux disease)   . Graves disease 01/13/11   radioactive iodine treatment, 16.3 millicuries  . Headache    tension headaches  . Hiatal hernia    small  . Hypertension   . Hyperthyroidism   . Hypothyroidism    s/p treatment  . Murmur    benign, h/o, caused by hyperdynamic contraction  . Obesity   . Pneumonia 2017   inhaler prescribed  . Sleep apnea 2021   no CPAP as yet, last study done lastnight 04/15/2019  . Tendonitis of shoulder, right   . Vitamin D deficiency   . Vitamin D deficiency     Past Surgical History:  Procedure Laterality Date  . BALLOON DILATION N/A 09/07/2016   Procedure: BALLOON DILATION;  Surgeon: Arta Silence, MD;  Location: [facility] END[facility];  Service: Endoscopy;  Laterality: N/A;  . BREAST LUMPECTOMY WITH RADIOACTIVE SEED AND SENTINEL LYMPH NODE BIOPSY Left 04/17/2019   Procedure: LEFT BREAST LUMPECTOMY WITH RADIOACTIVE SEED AND SENTINEL LYMPH  NODE BIOPSY;  Surgeon: Donnie Mesa, MD;  Location: [facility];  Service: General;  Laterality: Left;  LMA, PEC BLOCK  . CESAREAN SECTION  1999  . COLONOSCOPY    . ESOPHAGOGASTRODUODENOSCOPY    . ESOPHAGOGASTRODUODENOSCOPY (EGD) WITH PROPOFOL N/A 09/07/2016   Procedure: ESOPHAGOGASTRODUODENOSCOPY (EGD) WITH PROPOFOL;  Surgeon: Arta Silence, MD;  Location: [facility];  Service: Endoscopy;  Laterality: N/A;  . HYSTERECTOMY ABDOMINAL WITH SALPINGECTOMY Bilateral 02/17/2017   Procedure: HYSTERECTOMY ABDOMINAL WITH SALPINGECTOMY;  Surgeon: Christophe Louis, MD;  Location: [facility];  Service: Gynecology;  Laterality: Bilateral;  . KNEE ARTHROSCOPY     for torn meniscus  . PORTACATH PLACEMENT Right 05/22/2019   Procedure: INSERTION PORT-A-CATH WITH ULTRASOUND GUIDANCE;  Surgeon: Donnie Mesa, MD;  Location: [facility];  Service: General;  Laterality: Right;  Endotrachial tube  . WISDOM TOOTH EXTRACTION    . WISDOM TOOTH EXTRACTION      There were no vitals filed for this visit.    Subjective Assessment - 09/17/19 1305    Subjective Pt is here because of pain in her left armpit and left breast from radiation she is currently 25 of 33.  She has noticed the skin in her armpit and under her breast is fragile. It is acutually  a little better.  She also comes with a prescription from her PCP to hlep with pain in her right back and leg has been going numb.  She when she lays flat on her back at night even with an adjustable frame her right lateral upper thigh goes numb and burning pain. If she is on her side she doesn't have pain. she also noticed a sharp pain in her right hip when she is on her back and tried to lift her right leg    Pertinent History Patient was diagnosed 03/19/2019 with left grade III invasive ductal carcinoma breast cancer. Patient reports she underwent a left lumpectomy and sentinel node biopsy on 04/17/2019 with 2 negative nodes removed. It is triple negative with a Ki67 of 30%. Chemotherapy and  radiation, past history includes right knee pain    Currently in Pain? No/denies   has pain and numbness in right thigh lying on back   Pain Score 10-Worst pain ever   has gotten to 10 a few time   Pain Location --   thigh   Pain Orientation Right    Pain Descriptors / Indicators Numbness;Sharp    Pain Type Acute pain    Pain Onset More than a month ago    Pain Frequency Intermittent    Aggravating Factors  lying on back, raising leg up    Pain Relieving Factors chang of position to lie on side              [facility] PT Assessment - 09/17/19 0001      Assessment   Medical Diagnosis s/p left lumpectomy     Referring Provider (PT) Loretta Nelson,  Loretta Nelson     Onset Date/Surgical Date 04/17/19    Hand Dominance Right    Prior Therapy Baselines      Precautions   Precautions Other (comment)    Precaution Comments recent surgery; left arm lymphedema risk      Restrictions   Weight Bearing Restrictions No      Home Environment   Living Environment Private residence    Living Arrangements Children;Parent   54 y.o. 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 She got a bra with high sides that provides some compression to lateral breast and chest and has helped her with her symptoms.  Her left breast is still overflowing from cup. She feels her armpit feels better since wearing the bra       Posture/Postural Control   Posture/Postural Control Postural limitations     Postural Limitations Rounded Shoulders;Forward head;Increased lumbar lordosis;Increased thoracic kyphosis    Posture Comments pt with firm obesity       ROM / Strength   AROM / PROM / Strength AROM      AROM   Overall AROM  Deficits;Due to pain    AROM Assessment Site Shoulder;Lumbar  Right/Left Shoulder Left    Left Shoulder Extension --    Left Shoulder Flexion 155 Degrees    Left Shoulder ABduction 145 Degrees    Left Shoulder Internal Rotation --    Left Shoulder External Rotation --    Lumbar Flexion decreased flexion with tightness a lumbar spine/maintenance of lumbar lordosis     Lumbar Extension WFL    Lumbar - Right Side Bend WFL    Lumbar - Left Side Bend WFL    Lumbar - Right Rotation painful and limited     Lumbar - Left Rotation WFL                      Objective measurements completed on examination: See above findings.       [facility] Adult PT Treatment/Exercise - 09/17/19 0001      Exercises   Exercises Lumbar      Lumbar Exercises: Stretches   Pelvic Tilt 5 reps    Pelvic Tilt Limitations in supine and sitting     Other Lumbar Stretch Exercise knee to chest in supine with assist of towel     Other Lumbar Stretch Exercise in sitting, elbows to knees to stretch low back                   PT Education - 09/17/19 2023    Education Details gentle low back ROM    Person(s) Educated Patient    Methods Explanation    Comprehension Verbalized understanding;Returned demonstration            PT Short Term Goals - 09/17/19 2035      PT SHORT TERM GOAL #1   Title Pt will report the pain in her right hip has decreased to 7/10 at the most    Baseline 10/10 at times on 09/17/2019    Time 4    Period Weeks    Status New      PT SHORT TERM GOAL #2   Title Pt will be independent in a home exericse pain for lumbar mobiity and Nelson strengthening    Time 4    Period Weeks    Status New             PT Long Term Goals - 09/17/19 2037        PT LONG TERM GOAL #1   Title Pt will be independent in managment of left breast lymphedem post radiation    Time 8    Period Weeks    Status New      PT LONG TERM GOAL #2   Title Pt will report the pain and dicomfort in her left upper quadrant is decreased by 50%    Time 8    Period Weeks    Status New                  Plan - 09/17/19 2024    Clinical Impression Statement Pt comes to PT with 8 more breast radiation treatments and some problems with moist desquamation that she is managing on her own. She states the her breast and axilla are doing better, but she is mostly bothered by pain and numbness in her right thigh.  She has a new prescription for PT from [employer].  Will send certification to her and begin by treating her low back and hip pain and monitoring her left breast and axilla.  As pt recovers from her radiation will begin with treatment of lympheda  of her breast  Will continue to send progress/discharge notes to both Loretta Nelson and Loretta Nelson    Personal Factors and Comorbidities Comorbidity 3+    Comorbidities breast cancer,sugery,  chemotherapy, radiation, back and hip pain, right knee pain    Stability/Clinical Decision Making Evolving/Moderate complexity    Clinical Decision Making Moderate    Rehab Potential Excellent    PT Frequency 2x / week    PT Duration 8 weeks    PT Treatment/Interventions ADLs/Self Care Home Management;Therapeutic exercise;Manual techniques;Patient/family education;Joint Manipulations;Passive range of motion;Therapeutic activities;Manual lymph drainage;Moist Heat;Orthotic Fit/Training;Compression bandaging;Taping    PT Next Visit Plan moist heat and soft tissue work to low back and right hip, progress Nelson mobiliyt and exercise, hip abductor work, monitor right upper quadrant and address symptoms as indicated    PT Home Exercise Plan lumbar mobility exercise    Consulted and Agree with Plan of Care Patient            Patient will benefit from skilled therapeutic intervention in order to improve the following deficits and impairments:  Postural dysfunction, Decreased range of motion, Pain, Impaired UE functional use, Decreased knowledge of precautions, Increased edema, Obesity, Impaired flexibility  Visit Diagnosis: Abnormal posture - Plan: PT plan of care cert/re-cert  Disorder of the skin and subcutaneous tissue related to radiation, unspecified - Plan: PT plan of care cert/re-cert  Lymphedema, not elsewhere classified - Plan: PT plan of care cert/re-cert  Pain in right hip - Plan: PT plan of care cert/re-cert     Problem List Patient Active Problem List   Diagnosis Date Noted  . Port-A-Cath in place 08/01/2019  . Morbid obesity with body mass index of 50 or higher ([place]) 06/20/2019  . Genetic testing 04/19/2019  . 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 Family history of breast cancer   . Malignant neoplasm of upper-outer quadrant of left breast in female, estrogen receptor positive ([place]) 04/05/2019  . Flat foot 03/12/2018  . 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 Fibroids, intramural 02/17/2017  . Menorrhagia 02/17/2017  . S/P hysterectomy 02/17/2017   Donato Heinz. Owens Shark PT  Norwood Levo 09/17/2019, 8:42 PM  [place], [place], [address] Phone: [phone]   Fax:  [phone]  Name: Loretta Nelson MRN: [id number] Date of Birth: 1965/09/07

## 2019-09-18 ENCOUNTER — Ambulatory Visit
Admission: RE | Admit: 2019-09-18 | Discharge: 2019-09-18 | Disposition: A | Discharge status: Home / Self Care | Payer: BC Managed Care – PPO | Source: Ambulatory Visit | Attending: Radiation Oncology | Admitting: Radiation Oncology

## 2019-09-18 ENCOUNTER — Other Ambulatory Visit: Payer: Self-pay

## 2019-09-18 DIAGNOSIS — C50412 Malignant neoplasm of upper-outer quadrant of left female breast: Secondary | ICD-10-CM | POA: Diagnosis not present

## 2019-09-19 ENCOUNTER — Ambulatory Visit
Admission: RE | Admit: 2019-09-19 | Discharge: 2019-09-19 | Disposition: A | Discharge status: Home / Self Care | Payer: BC Managed Care – PPO | Source: Ambulatory Visit | Attending: Radiation Oncology | Admitting: Radiation Oncology

## 2019-09-19 ENCOUNTER — Inpatient Hospital Stay: Payer: BC Managed Care – PPO | Attending: Oncology

## 2019-09-19 ENCOUNTER — Other Ambulatory Visit: Payer: Self-pay

## 2019-09-19 DIAGNOSIS — C50412 Malignant neoplasm of upper-outer quadrant of left female breast: Secondary | ICD-10-CM | POA: Diagnosis not present

## 2019-09-19 DIAGNOSIS — Z452 Encounter for adjustment and management of vascular access device: Secondary | ICD-10-CM | POA: Diagnosis present

## 2019-09-19 DIAGNOSIS — Z95828 Presence of other vascular implants and grafts: Secondary | ICD-10-CM

## 2019-09-19 DIAGNOSIS — C50812 Malignant neoplasm of overlapping sites of left female breast: Secondary | ICD-10-CM | POA: Insufficient documentation

## 2019-09-19 MED ORDER — HEPARIN SOD (PORK) LOCK FLUSH 100 UNIT/ML IV SOLN
500.0000 [IU] | Freq: Once | INTRAVENOUS | Status: AC | PRN
  Administered 2019-09-19: 500 [IU]
  Filled 2019-09-19: qty 5

## 2019-09-19 MED ORDER — SODIUM CHLORIDE 0.9% FLUSH
10.0000 mL | INTRAVENOUS | Status: DC | PRN
  Administered 2019-09-19: 10 mL
  Filled 2019-09-19: qty 10

## 2019-09-19 NOTE — Patient Instructions (Signed)

## 2019-09-20 ENCOUNTER — Ambulatory Visit: Payer: BC Managed Care – PPO | Admitting: Radiation Oncology

## 2019-09-20 ENCOUNTER — Other Ambulatory Visit: Payer: Self-pay

## 2019-09-20 ENCOUNTER — Ambulatory Visit
Admission: RE | Admit: 2019-09-20 | Discharge: 2019-09-20 | Disposition: A | Discharge status: Home / Self Care | Payer: BC Managed Care – PPO | Source: Ambulatory Visit | Attending: Radiation Oncology | Admitting: Radiation Oncology

## 2019-09-20 DIAGNOSIS — C50412 Malignant neoplasm of upper-outer quadrant of left female breast: Secondary | ICD-10-CM | POA: Diagnosis not present

## 2019-09-23 ENCOUNTER — Ambulatory Visit: Payer: BC Managed Care – PPO

## 2019-09-23 ENCOUNTER — Other Ambulatory Visit: Payer: Self-pay

## 2019-09-23 ENCOUNTER — Ambulatory Visit
Admission: RE | Admit: 2019-09-23 | Discharge: 2019-09-23 | Disposition: A | Discharge status: Home / Self Care | Payer: BC Managed Care – PPO | Source: Ambulatory Visit | Attending: Radiation Oncology | Admitting: Radiation Oncology

## 2019-09-23 DIAGNOSIS — M25551 Pain in right hip: Secondary | ICD-10-CM

## 2019-09-23 DIAGNOSIS — C50412 Malignant neoplasm of upper-outer quadrant of left female breast: Secondary | ICD-10-CM | POA: Diagnosis not present

## 2019-09-23 DIAGNOSIS — R293 Abnormal posture: Secondary | ICD-10-CM | POA: Diagnosis not present

## 2019-09-23 DIAGNOSIS — I890 Lymphedema, not elsewhere classified: Secondary | ICD-10-CM

## 2019-09-23 DIAGNOSIS — L599 Disorder of the skin and subcutaneous tissue related to radiation, unspecified: Secondary | ICD-10-CM

## 2019-09-23 NOTE — Therapy (Signed)
[place], [place], [address] Phone: [phone]   Fax:  [phone]  Physical Therapy Treatment  Patient Details  Name: Loretta Nelson MRN: [id number] Date of Birth: [birth year] Referring Provider (PT): Shona Simpson,  Benjiman Core    Encounter Date: 09/23/2019   PT End of Session - 09/23/19 1648    Visit Number 4    Number of Visits 18    Date for PT Re-Evaluation 11/18/19    PT Start Time 1410    PT Stop Time 1503    PT Time Calculation (min) 53 min    Activity Tolerance Patient tolerated treatment well    Behavior During Therapy [facility] for tasks assessed/performed           Past Medical History:  Diagnosis Date  . Allergic rhinitis   . Anemia    low iron  . Anemia   . Anxiety   . Anxiety   . Arthritis    right knee, surgery done  . Cancer ([place]) 2021   left breast   . Complication of anesthesia    woke up during EGD  . Esophageal stricture   . Family history of breast cancer   . GERD (gastroesophageal reflux disease)   . Graves disease 01/13/11   radioactive iodine treatment, 49.2 millicuries  . Headache    tension headaches  . Hiatal hernia    small  . Hypertension   . Hyperthyroidism   . Hypothyroidism    s/p treatment  . Murmur    benign, h/o, caused by hyperdynamic contraction  . Obesity   . Pneumonia 2017   inhaler prescribed  . Sleep apnea 2021   no CPAP as yet, last study done lastnight 04/15/2019  . Tendonitis of shoulder, right   . Vitamin D deficiency   . Vitamin D deficiency     Past Surgical History:  Procedure Laterality Date  . BALLOON DILATION N/A 09/07/2016   Procedure: BALLOON DILATION;  Surgeon: Arta Silence, MD;  Location: [facility] ENDOSCOPY;  Service: Endoscopy;  Laterality: N/A;  . BREAST LUMPECTOMY WITH RADIOACTIVE SEED AND SENTINEL LYMPH NODE BIOPSY Left 04/17/2019   Procedure: LEFT BREAST LUMPECTOMY WITH RADIOACTIVE SEED AND SENTINEL LYMPH NODE  BIOPSY;  Surgeon: Donnie Mesa, MD;  Location: [place];  Service: General;  Laterality: Left;  LMA, PEC BLOCK  . CESAREAN SECTION  1999  . COLONOSCOPY    . ESOPHAGOGASTRODUODENOSCOPY    . ESOPHAGOGASTRODUODENOSCOPY (EGD) WITH PROPOFOL N/A 09/07/2016   Procedure: ESOPHAGOGASTRODUODENOSCOPY (EGD) WITH PROPOFOL;  Surgeon: Arta Silence, MD;  Location: [place];  Service: Endoscopy;  Laterality: N/A;  . HYSTERECTOMY ABDOMINAL WITH SALPINGECTOMY Bilateral 02/17/2017   Procedure: HYSTERECTOMY ABDOMINAL WITH SALPINGECTOMY;  Surgeon: Christophe Louis, MD;  Location: [facility];  Service: Gynecology;  Laterality: Bilateral;  . KNEE ARTHROSCOPY     for torn meniscus  . PORTACATH PLACEMENT Right 05/22/2019   Procedure: INSERTION PORT-A-CATH WITH ULTRASOUND GUIDANCE;  Surgeon: Donnie Mesa, MD;  Location: [facility];  Service: General;  Laterality: Right;  Endotrachial tube  . WISDOM TOOTH EXTRACTION    . WISDOM TOOTH EXTRACTION      There were no vitals filed for this visit.   Subjective Assessment - 09/23/19 1416    Subjective My back isn't as bad today, but my Rt hip is still bothering me.    Pertinent History Patient was diagnosed 03/19/2019 with left grade III invasive ductal carcinoma breast cancer. Patient reports she underwent a left lumpectomy and sentinel  node biopsy on 04/17/2019 with 2 negative nodes removed. It is triple negative with a Ki67 of 30%. Chemotherapy and radiation, past history includes right knee pain    Patient Stated Goals Recheck and see if my arm is ok    Currently in Pain? No/denies   tender to palpation                            [facility] Adult PT Treatment/Exercise - 09/23/19 0001      Self-Care   Self-Care Other Self-Care Comments    Other Self-Care Comments  Educated pt about importance of proper body mechanics throughout day, especially when she returns to work. Also educated pt about TENS unit and how this could be beneficial to muscle tightness. Showed pt  this on [employer].       Lumbar Exercises: Stretches   Passive Hamstring Stretch Right;Left;2 reps;20 seconds   Rt in supine, bil in sitting   Passive Hamstring Stretch Limitations cuing to hold erect posture throughout    Single Knee to Chest Stretch Right;2 reps;20 seconds    Pelvic Tilt 5 reps    Pelvic Tilt Limitations reviewed in supine and sitting     Quad Stretch Right;1 rep;10 seconds   with foot behind pt in chair and +2 HHA on counter   Piriformis Stretch Right;1 rep;20 seconds   sitting edge of chair, pt returned therapist demo     Modalities   Modalities Moist Heat      Moist Heat Therapy   Number Minutes Moist Heat 15 Minutes   while going over self care, see above   Moist Heat Location Hip   Rt in Lt S/L     Manual Therapy   Manual Therapy Soft tissue mobilization    Soft tissue mobilization With coconut oil to Rt low back and hip area, then applied biofreeze to same.                   PT Education - 09/23/19 1503    Education Details Bil hip flexibility    Person(s) Educated Patient    Methods Explanation;Demonstration;Handout    Comprehension Verbalized understanding;Returned demonstration            PT Short Term Goals - 09/17/19 2035      PT SHORT TERM GOAL #1   Title Pt will report the pain in her right hip has decreased to 7/10 at the most    Baseline 10/10 at times on 09/17/2019    Time 4    Period Weeks    Status New      PT SHORT TERM GOAL #2   Title Pt will be independent in a home exericse pain for lumbar mobiity and core strengthening    Time 4    Period Weeks    Status New             PT Long Term Goals - 09/17/19 2037      PT LONG TERM GOAL #1   Title Pt will be independent in managment of left breast lymphedem post radiation    Time 8    Period Weeks    Status New      PT LONG TERM GOAL #2   Title Pt will report the pain and dicomfort in her left upper quadrant is decreased by 50%    Time 8    Period Weeks    Status  New  Plan - 09/23/19 1710    Clinical Impression Statement First sesion today of treatment for Rt hip/low back pain. Progressed HEP to include Rt>Lt hip flexibility and reviewed exercises educated at last session and added them to HEP as well. Also included manual therapy after moist heat. Educated pt about importance of proper body mechanics with sitting and lifting, especially once she returns to work. Also about considering a TENS unit as this could help her manage pain/tightness at home and once she returns to work. She verbalized understanding and tolerated all very well and reports less pain/tightness at end of session.    Personal Factors and Comorbidities Comorbidity 3+    Comorbidities breast cancer,sugery,  chemotherapy, radiation, back and hip pain, right knee pain    Stability/Clinical Decision Making Evolving/Moderate complexity    Rehab Potential Excellent    PT Frequency 2x / week    PT Duration 8 weeks    PT Treatment/Interventions ADLs/Self Care Home Management;Therapeutic exercise;Manual techniques;Patient/family education;Joint Manipulations;Passive range of motion;Therapeutic activities;Manual lymph drainage;Moist Heat;Orthotic Fit/Training;Compression bandaging;Taping    PT Next Visit Plan Cont moist heat and soft tissue work to low back and right hip, progress core mobility (progress pelvic tilt to incorporate LE's) and exercise, hip abductor work, monitor right upper quadrant and address symptoms as indicated. If pt gets and brings in a TENS unit, educate her about this.    PT Home Exercise Plan lumbar mobility exercise, hip flexibility    Consulted and Agree with Plan of Care Patient           Patient will benefit from skilled therapeutic intervention in order to improve the following deficits and impairments:  Postural dysfunction, Decreased range of motion, Pain, Impaired UE functional use, Decreased knowledge of precautions, Increased edema,  Obesity, Impaired flexibility  Visit Diagnosis: Abnormal posture  Disorder of the skin and subcutaneous tissue related to radiation, unspecified  Lymphedema, not elsewhere classified  Pain in right hip     Problem List Patient Active Problem List   Diagnosis Date Noted  . Port-A-Cath in place 08/01/2019  . Morbid obesity with body mass index of 50 or higher ([place]) 06/20/2019  . Genetic testing 04/19/2019  . 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 Family history of breast cancer   . Malignant neoplasm of upper-outer quadrant of left breast in female, estrogen receptor positive ([place]) 04/05/2019  . Flat foot 03/12/2018  . 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 Fibroids, intramural 02/17/2017  . Menorrhagia 02/17/2017  . S/P hysterectomy 02/17/2017    Otelia Limes, PTA 09/23/2019, 5:24 PM  [place], [place], [address] Phone: [phone]   Fax:  [phone]  Name: Loretta Nelson MRN: [id number] Date of Birth: 27-Jan-1966

## 2019-09-23 NOTE — Patient Instructions (Addendum)
Hamstring Stretch: Active    Support behind right knee. Starting with knee bent, attempt to straighten knee until a comfortable stretch is felt in back of thigh. Hold __20-30__ seconds. Repeat __3__ times per set. Do __1__ sets per session. Do __2-3__ sessions per day.   Knee to Chest    Lying supine, bend involved knee to chest _5__ times. Repeat with other leg. Do _5__ times per day.  PELVIC TILT: Posterior    Tighten abdominals, flatten low back. _10__ reps per set, _1-2__ sets per day, holding for __5__ seconds.   Piriformis Stretch, Sitting    In sitting, cross Rt ankle on opposite knee, same-side hand on crossed knee. Push down on knee, keeping spine straight. Lean torso forward, with flat back, until tension is felt in hamstrings and gluteals of crossed-leg side. Hold _20-30__ seconds.  Repeat _2-3__ times per session. Do _2-3__ sessions per day.  Stretching: Quadriceps (Standing)    Place top of  foot in chair behind you and stand up tall pulling abdominals in, until stretch is felt in front of thigh. Hold _20-30___ seconds. Repeat __2-3__ times per set. Do _2-3___ sessions per day.   Cancer Rehab [phone]

## 2019-09-24 ENCOUNTER — Other Ambulatory Visit: Payer: Self-pay

## 2019-09-24 ENCOUNTER — Ambulatory Visit
Admission: RE | Admit: 2019-09-24 | Discharge: 2019-09-24 | Disposition: A | Discharge status: Home / Self Care | Payer: BC Managed Care – PPO | Source: Ambulatory Visit | Attending: Radiation Oncology | Admitting: Radiation Oncology

## 2019-09-24 DIAGNOSIS — C50412 Malignant neoplasm of upper-outer quadrant of left female breast: Secondary | ICD-10-CM | POA: Diagnosis not present

## 2019-09-25 ENCOUNTER — Ambulatory Visit: Payer: BC Managed Care – PPO

## 2019-09-25 ENCOUNTER — Other Ambulatory Visit: Payer: Self-pay

## 2019-09-25 ENCOUNTER — Ambulatory Visit
Admission: RE | Admit: 2019-09-25 | Discharge: 2019-09-25 | Disposition: A | Discharge status: Home / Self Care | Payer: BC Managed Care – PPO | Source: Ambulatory Visit | Attending: Radiation Oncology | Admitting: Radiation Oncology

## 2019-09-25 DIAGNOSIS — R293 Abnormal posture: Secondary | ICD-10-CM

## 2019-09-25 DIAGNOSIS — M25551 Pain in right hip: Secondary | ICD-10-CM

## 2019-09-25 DIAGNOSIS — L599 Disorder of the skin and subcutaneous tissue related to radiation, unspecified: Secondary | ICD-10-CM

## 2019-09-25 DIAGNOSIS — I890 Lymphedema, not elsewhere classified: Secondary | ICD-10-CM

## 2019-09-25 DIAGNOSIS — C50412 Malignant neoplasm of upper-outer quadrant of left female breast: Secondary | ICD-10-CM | POA: Diagnosis not present

## 2019-09-25 NOTE — Therapy (Signed)
[place], [place], [address] Phone: [phone]   Fax:  [phone]  Physical Therapy Treatment  Patient Details  Name: Loretta Nelson MRN: [id number] Date of Birth: Dec 27, 1965 Referring Provider (PT): Shona Simpson,  Benjiman Core    Encounter Date: 09/25/2019   PT End of Session - 09/25/19 1212    Visit Number 5    Number of Visits 18    Date for PT Re-Evaluation 11/18/19    PT Start Time 1106    PT Stop Time 1207    PT Time Calculation (min) 61 min    Activity Tolerance Patient tolerated treatment well    Behavior During Therapy [facility] for tasks assessed/performed           Past Medical History:  Diagnosis Date  . Allergic rhinitis   . Anemia    low iron  . Anemia   . Anxiety   . Anxiety   . Arthritis    right knee, surgery done  . Cancer ([place]) 2021   left breast   . Complication of anesthesia    woke up during EGD  . Esophageal stricture   . Family history of breast cancer   . GERD (gastroesophageal reflux disease)   . Graves disease 01/13/11   radioactive iodine treatment, 24.4 millicuries  . Headache    tension headaches  . Hiatal hernia    small  . Hypertension   . Hyperthyroidism   . Hypothyroidism    s/p treatment  . Murmur    benign, h/o, caused by hyperdynamic contraction  . Obesity   . Pneumonia 2017   inhaler prescribed  . Sleep apnea 2021   no CPAP as yet, last study done lastnight 04/15/2019  . Tendonitis of shoulder, right   . Vitamin D deficiency   . Vitamin D deficiency     Past Surgical History:  Procedure Laterality Date  . BALLOON DILATION N/A 09/07/2016   Procedure: BALLOON DILATION;  Surgeon: Arta Silence, MD;  Location: [facility]COPY;  Service: Endoscopy;  Laterality: N/A;  . BREAST LUMPECTOMY WITH RADIOACTIVE SEED AND SENTINEL LYMPH NODE BIOPSY Left 04/17/2019   Procedure: LEFT BREAST LUMPECTOMY WITH RADIOACTIVE SEED AND SENTINEL LYMPH NODE  BIOPSY;  Surgeon: Donnie Mesa, MD;  Location: [facility];  Service: General;  Laterality: Left;  LMA, PEC BLOCK  . CESAREAN SECTION  1999  . COLONOSCOPY    . ESOPHAGOGASTRODUODENOSCOPY    . ESOPHAGOGASTRODUODENOSCOPY (EGD) WITH PROPOFOL N/A 09/07/2016   Procedure: ESOPHAGOGASTRODUODENOSCOPY (EGD) WITH PROPOFOL;  Surgeon: Arta Silence, MD;  Location: [facility];  Service: Endoscopy;  Laterality: N/A;  . HYSTERECTOMY ABDOMINAL WITH SALPINGECTOMY Bilateral 02/17/2017   Procedure: HYSTERECTOMY ABDOMINAL WITH SALPINGECTOMY;  Surgeon: Christophe Louis, MD;  Location: [facility];  Service: Gynecology;  Laterality: Bilateral;  . KNEE ARTHROSCOPY     for torn meniscus  . PORTACATH PLACEMENT Right 05/22/2019   Procedure: INSERTION PORT-A-CATH WITH ULTRASOUND GUIDANCE;  Surgeon: Donnie Mesa, MD;  Location: [facility];  Service: General;  Laterality: Right;  Endotrachial tube  . WISDOM TOOTH EXTRACTION    . WISDOM TOOTH EXTRACTION      There were no vitals filed for this visit.   Subjective Assessment - 09/25/19 1115    Subjective I felt better after last session but I still had the pain last night, even after trying the stretches. I rubbed some aspercreme on it and I think that helped a little.    Pertinent History Patient was diagnosed 03/19/2019  with left grade III invasive ductal carcinoma breast cancer. Patient reports she underwent a left lumpectomy and sentinel node biopsy on 04/17/2019 with 2 negative nodes removed. It is triple negative with a Ki67 of 30%. Chemotherapy and radiation, past history includes right knee pain    Currently in Pain? No/denies   tender to palpation                            [facility] Adult PT Treatment/Exercise - 09/25/19 0001      Self-Care   Other Self-Care Comments  Pt ordered her TENS but it hadn't arrived yet so instructed her in this during moist heat. Also reviewed core stabs done today and how to incororporate this into HEP      Lumbar Exercises:  Stretches   Passive Hamstring Stretch Right;Left;2 reps;20 seconds    Passive Hamstring Stretch Limitations seated edge of mat    Single Knee to Chest Stretch Right;2 reps;20 seconds   with towel behind knee and slight diagonal pull to Lt   Piriformis Stretch Right;2 reps;30 seconds   in supine with towel at ankle   Piriformis Stretch Limitations unable to get into position without pain in Rt hip today, so done in supine instead      Lumbar Exercises: Aerobic   Nustep Level 5 x 71mns with therapist monitoring pt throughout      Lumbar Exercises: Supine   Pelvic Tilt 10 reps;5 seconds    Clam 10 reps   with post pelvic tilt   Clam Limitations cuing for technique, then pt able to return good demo    Bent Knee Raise 10 reps   with post pelvic tilt   Bent Knee Raise Limitations cuing for technique, then pt returned therapist demo    Bridge 10 reps   2 sets of 5 reps   Bridge Limitations pt with limited ROM due to weakness      Moist Heat Therapy   Number Minutes Moist Heat 10 Minutes   discussing HEP and progression of core stabs during   Moist Heat Location Hip   to Rt hip in Lt S/L     Manual Therapy   Manual Therapy Soft tissue mobilization    Soft tissue mobilization With coconut oil to Rt lateral hip and proximal ITB where pt reports most TTP, then applied biofreeze to same.                   PT Education - 09/25/19 1210    Education Details Core stabs    Person(s) Educated Patient    Methods Explanation;Demonstration;Handout    Comprehension Verbalized understanding;Returned demonstration;Need further instruction            PT Short Term Goals - 09/17/19 2035      PT SHORT TERM GOAL #1   Title Pt will report the pain in her right hip has decreased to 7/10 at the most    Baseline 10/10 at times on 09/17/2019    Time 4    Period Weeks    Status New      PT SHORT TERM GOAL #2   Title Pt will be independent in a home exericse pain for lumbar mobiity and core  strengthening    Time 4    Period Weeks    Status New             PT Long Term Goals - 09/17/19 2037  PT LONG TERM GOAL #1   Title Pt will be independent in managment of left breast lymphedem post radiation    Time 8    Period Weeks    Status New      PT LONG TERM GOAL #2   Title Pt will report the pain and dicomfort in her left upper quadrant is decreased by 50%    Time 8    Period Weeks    Status New                 Plan - 09/25/19 1212    Clinical Impression Statement Progressed pt to include NuStep for hip flexibility and progressed HEP to include core stabs which were challenging for pt. Also included self care instructions of how to best incorporate new HEP trying these on floor if she feels she can get up and down safely, bed if not. Also she reports pain when lifting legs on radiaiton table so encouraged her to try engaging abdominals first to decrease strain at hip flexors. Pt able to return good demo of this reporting less discomfort. She ordered a TENS unit that arrives today. Educated her how to try this on Rt hip today and that she can bring in to next appt if she has questions. ended session with MHP and manual therapy to Rt hip. Pt reports hip feeling good at end of session.    Personal Factors and Comorbidities Comorbidity 3+    Comorbidities breast cancer,sugery,  chemotherapy, radiation, back and hip pain, right knee pain    Stability/Clinical Decision Making Evolving/Moderate complexity    Rehab Potential Excellent    PT Frequency 2x / week    PT Treatment/Interventions ADLs/Self Care Home Management;Therapeutic exercise;Manual techniques;Patient/family education;Joint Manipulations;Passive range of motion;Therapeutic activities;Manual lymph drainage;Moist Heat;Orthotic Fit/Training;Compression bandaging;Taping    PT Next Visit Plan Cont moist heat and soft tissue work to low back and right hip, progress core mobility reviewing new HEP issued today; add  hip abductor work in standing with core engaged, monitor left upper quadrant and address symptoms as indicated. If pt gets and brings in a TENS unit, educate her about this.    PT Home Exercise Plan lumbar mobility exercise, hip flexibility; core stabs    Consulted and Agree with Plan of Care Patient           Patient will benefit from skilled therapeutic intervention in order to improve the following deficits and impairments:  Postural dysfunction, Decreased range of motion, Pain, Impaired UE functional use, Decreased knowledge of precautions, Increased edema, Obesity, Impaired flexibility  Visit Diagnosis: Abnormal posture  Disorder of the skin and subcutaneous tissue related to radiation, unspecified  Lymphedema, not elsewhere classified  Pain in right hip     Problem List Patient Active Problem List   Diagnosis Date Noted  . Port-A-Cath in place 08/01/2019  . Morbid obesity with body mass index of 50 or higher ([place]) 06/20/2019  . Genetic testing 04/19/2019  . 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 Family history of breast cancer   . Malignant neoplasm of upper-outer quadrant of left breast in female, estrogen receptor positive ([place]) 04/05/2019  . Flat foot 03/12/2018  . 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 Fibroids, intramural 02/17/2017  . Menorrhagia 02/17/2017  . S/P hysterectomy 02/17/2017    Otelia Limes, PTA 09/25/2019, 12:20 PM  [place], [place], [address] Phone: [phone]   Fax:  [phone]  Name: Loretta  Christabell Nelson MRN: [id number] Date of Birth: 01-23-1966

## 2019-09-25 NOTE — Patient Instructions (Signed)
Pelvic Tilt: Posterior - Legs Bent (Supine)    Tighten stomach and flatten back by rolling pelvis down. Hold __5__ seconds. Relax. Repeat __10__ times per set. Do _1-2___ sets per session. Do __1-2__ sessions per day.  Now hold tilt and add: 1. Clam (open/close knees) slowly and only as far as you can control. 2. Alternate marching  2 sets of 5-10 of each  Bridge    Lie back, legs bent. Inhale, pressing hips up. Keeping ribs in, lengthen lower back. Exhale, rolling down along spine from top. Repeat __10__ times. Do _1-2___ sessions per day.  Cancer Rehab [phone]

## 2019-09-26 ENCOUNTER — Ambulatory Visit: Payer: BC Managed Care – PPO | Admitting: Rehabilitation

## 2019-09-26 ENCOUNTER — Ambulatory Visit
Admission: RE | Admit: 2019-09-26 | Discharge: 2019-09-26 | Disposition: A | Discharge status: Home / Self Care | Payer: BC Managed Care – PPO | Source: Ambulatory Visit | Attending: Radiation Oncology | Admitting: Radiation Oncology

## 2019-09-26 ENCOUNTER — Encounter: Payer: Self-pay | Admitting: *Deleted

## 2019-09-26 ENCOUNTER — Other Ambulatory Visit: Payer: Self-pay

## 2019-09-26 DIAGNOSIS — C50412 Malignant neoplasm of upper-outer quadrant of left female breast: Secondary | ICD-10-CM | POA: Diagnosis not present

## 2019-09-27 ENCOUNTER — Other Ambulatory Visit: Payer: Self-pay

## 2019-09-27 ENCOUNTER — Encounter: Payer: Self-pay | Admitting: Radiation Oncology

## 2019-09-27 ENCOUNTER — Ambulatory Visit
Admission: RE | Admit: 2019-09-27 | Discharge: 2019-09-27 | Disposition: A | Discharge status: Home / Self Care | Payer: BC Managed Care – PPO | Source: Ambulatory Visit | Attending: Radiation Oncology | Admitting: Radiation Oncology

## 2019-09-27 DIAGNOSIS — C50412 Malignant neoplasm of upper-outer quadrant of left female breast: Secondary | ICD-10-CM | POA: Diagnosis not present

## 2019-09-30 ENCOUNTER — Other Ambulatory Visit: Payer: Self-pay

## 2019-09-30 ENCOUNTER — Ambulatory Visit: Payer: BC Managed Care – PPO | Attending: Oncology

## 2019-09-30 DIAGNOSIS — R293 Abnormal posture: Secondary | ICD-10-CM | POA: Diagnosis present

## 2019-09-30 DIAGNOSIS — M25551 Pain in right hip: Secondary | ICD-10-CM | POA: Diagnosis present

## 2019-09-30 DIAGNOSIS — I890 Lymphedema, not elsewhere classified: Secondary | ICD-10-CM

## 2019-09-30 DIAGNOSIS — Z483 Aftercare following surgery for neoplasm: Secondary | ICD-10-CM | POA: Insufficient documentation

## 2019-09-30 DIAGNOSIS — L599 Disorder of the skin and subcutaneous tissue related to radiation, unspecified: Secondary | ICD-10-CM | POA: Diagnosis present

## 2019-09-30 NOTE — Patient Instructions (Addendum)

## 2019-09-30 NOTE — Therapy (Signed)
[place], [place], [address] Phone: [phone]   Fax:  [phone]  Physical Therapy Treatment  Patient Details  Name: Loretta Nelson MRN: [id number] Date of Birth: Jul 27, 1965 Referring Provider (PT): Shona Simpson,  Benjiman Core    Encounter Date: 09/30/2019   PT End of Session - 09/30/19 1506    Visit Number 6    Number of Visits 18    Date for PT Re-Evaluation 11/18/19    PT Start Time 0929    PT Stop Time 1505    PT Time Calculation (min) 54 min    Activity Tolerance Patient tolerated treatment well    Behavior During Therapy [facility] for tasks assessed/performed           Past Medical History:  Diagnosis Date  . Allergic rhinitis   . Anemia    low iron  . Anemia   . Anxiety   . Anxiety   . Arthritis    right knee, surgery done  . Cancer ([place]) 2021   left breast   . Complication of anesthesia    woke up during EGD  . Esophageal stricture   . Family history of breast cancer   . GERD (gastroesophageal reflux disease)   . Graves disease 01/13/11   radioactive iodine treatment, 57.4 millicuries  . Headache    tension headaches  . Hiatal hernia    small  . Hypertension   . Hyperthyroidism   . Hypothyroidism    s/p treatment  . Murmur    benign, h/o, caused by hyperdynamic contraction  . Obesity   . Pneumonia 2017   inhaler prescribed  . Sleep apnea 2021   no CPAP as yet, last study done lastnight 04/15/2019  . Tendonitis of shoulder, right   . Vitamin D deficiency   . Vitamin D deficiency     Past Surgical History:  Procedure Laterality Date  . BALLOON DILATION N/A 09/07/2016   Procedure: BALLOON DILATION;  Surgeon: Arta Silence, MD;  Location: [facility] END[facility];  Service: Endoscopy;  Laterality: N/A;  . BREAST LUMPECTOMY WITH RADIOACTIVE SEED AND SENTINEL LYMPH NODE BIOPSY Left 04/17/2019   Procedure: LEFT BREAST LUMPECTOMY WITH RADIOACTIVE SEED AND SENTINEL LYMPH NODE  BIOPSY;  Surgeon: Donnie Mesa, MD;  Location: [place];  Service: General;  Laterality: Left;  LMA, PEC BLOCK  . CESAREAN SECTION  1999  . COLONOSCOPY    . ESOPHAGOGASTRODUODENOSCOPY    . ESOPHAGOGASTRODUODENOSCOPY (EGD) WITH PROPOFOL N/A 09/07/2016   Procedure: ESOPHAGOGASTRODUODENOSCOPY (EGD) WITH PROPOFOL;  Surgeon: Arta Silence, MD;  Location: [place];  Service: Endoscopy;  Laterality: N/A;  . HYSTERECTOMY ABDOMINAL WITH SALPINGECTOMY Bilateral 02/17/2017   Procedure: HYSTERECTOMY ABDOMINAL WITH SALPINGECTOMY;  Surgeon: Christophe Louis, MD;  Location: [facility];  Service: Gynecology;  Laterality: Bilateral;  . KNEE ARTHROSCOPY     for torn meniscus  . PORTACATH PLACEMENT Right 05/22/2019   Procedure: INSERTION PORT-A-CATH WITH ULTRASOUND GUIDANCE;  Surgeon: Donnie Mesa, MD;  Location: [facility];  Service: General;  Laterality: Right;  Endotrachial tube  . WISDOM TOOTH EXTRACTION    . WISDOM TOOTH EXTRACTION      There were no vitals filed for this visit.   Subjective Assessment - 09/30/19 1415    Subjective I've made a few adjustments since I was here last. I'm sleeping with a pillow between my knees and that is helping alot with my night pain, but I'm working towards adjusting to that because it's a challenge keeping it between  my knees. I got the TENS unit and played with it some on my hip and I could tell a brief lessening of my hip pain. And overall my pain isn't worse, so also some improvement with that. It is still tender to touch though.    Pertinent History Patient was diagnosed 03/19/2019 with left grade III invasive ductal carcinoma breast cancer. Patient reports she underwent a left lumpectomy and sentinel node biopsy on 04/17/2019 with 2 negative nodes removed. It is triple negative with a Ki67 of 30%. Chemotherapy and radiation, past history includes right knee pain    Patient Stated Goals Recheck and see if my arm is ok    Currently in Pain? No/denies                              [facility] Adult PT Treatment/Exercise - 09/30/19 0001      Self-Care   Other Self-Care Comments  Pt brought her new TENS unit and was instructed on how to use this today. Pt placed electrodes on herself and she increased output to comfortable level. She chose to wear this during moist heat today as well.  Answered pts questions about beginning MLD for Lt breast during MHP as well and how we will assess skin at next session to see if we can start this treatment. Also educated pt in importance of daily walking routine and how this will help her get her bil LE strength and overall endurance back. Daily walking routine handout issued as well.       Lumbar Exercises: Aerobic   Nustep Level 5 x 9 mins with therapist monitoring pt throughout      Knee/Hip Exercises: Standing   Hip Flexion Stengthening;Right;Left;10 reps   +2 HHA at counter   Hip Flexion Limitations pt returned therapist demo and VCs to keep core engaged throughout to promote correct techinque    Hip Abduction Stengthening;Right;Left;10 reps   +2 HHA at counter   Abduction Limitations pt returned therapist demo and tactile cues to decrease hip compensation    Hip Extension Stengthening;Right;Left;10 reps   +2 HHA at counter   Extension Limitations pt returned therapist demo and VCs for correct technique to keep abdominals engaged, she did well with self correcting after instruction of correct technique      Moist Heat Therapy   Number Minutes Moist Heat 10 Minutes   self care during   Moist Heat Location Hip   with pt wearing her TENS                 PT Education - 09/30/19 1505    Education Details TENS unit use with pts personal unit and daily walking routine    Person(s) Educated Patient    Methods Explanation;Demonstration;Handout   handout for daily walking routine   Comprehension Verbalized understanding;Returned demonstration            PT Short Term Goals - 09/17/19 2035       PT SHORT TERM GOAL #1   Title Pt will report the pain in her right hip has decreased to 7/10 at the most    Baseline 10/10 at times on 09/17/2019    Time 4    Period Weeks    Status New      PT SHORT TERM GOAL #2   Title Pt will be independent in a home exericse pain for lumbar mobiity and core strengthening    Time 4  Period Weeks    Status New             PT Long Term Goals - 09/17/19 2037      PT LONG TERM GOAL #1   Title Pt will be independent in managment of left breast lymphedem post radiation    Time 8    Period Weeks    Status New      PT LONG TERM GOAL #2   Title Pt will report the pain and dicomfort in her left upper quadrant is decreased by 50%    Time 8    Period Weeks    Status New                 Plan - 09/30/19 1507    Clinical Impression Statement Pt continues to tolerate NuStep well, progressing time each visit. Educated her and answered questions about use and wear of TENS unit including contraindications. Pt able to verbalize understanding and return good, safe use of unit. Progressed her to include standing hip 3 way raises with core engaged. Pt was challenged by these by noting having to work to keep pelvis stablized throughout and noting bil LE fatigue after standing. She was enouraged and educated about daily walking routine and importance of beginning this, handout also issued with further advice for how to begin this. Pt reports feeling encouraged by this and looks forward to trying to start incorporating this into her day to start help improving her overall endurance.    Personal Factors and Comorbidities Comorbidity 3+    Comorbidities breast cancer,sugery,  chemotherapy, radiation, back and hip pain, right knee pain    Stability/Clinical Decision Making Evolving/Moderate complexity    Rehab Potential Excellent    PT Frequency 2x / week    PT Duration 8 weeks    PT Treatment/Interventions ADLs/Self Care Home Management;Therapeutic  exercise;Manual techniques;Patient/family education;Joint Manipulations;Passive range of motion;Therapeutic activities;Manual lymph drainage;Moist Heat;Orthotic Fit/Training;Compression bandaging;Taping    PT Next Visit Plan Assess skin of Lt breast to determine if okay to begin manual lymph drainage; any questions about TENS? Review new HEP and try on Airex for incresaed challenge; Cont moist heat and soft tissue work to low back and right hip, progress core mobility reviewing new HEP issued today; add hip abductor work in standing with core engaged    PT Home Exercise Plan lumbar mobility exercise, hip flexibility; core stabs    Consulted and Agree with Plan of Care Patient           Patient will benefit from skilled therapeutic intervention in order to improve the following deficits and impairments:  Postural dysfunction, Decreased range of motion, Pain, Impaired UE functional use, Decreased knowledge of precautions, Increased edema, Obesity, Impaired flexibility  Visit Diagnosis: Abnormal posture  Disorder of the skin and subcutaneous tissue related to radiation, unspecified  Lymphedema, not elsewhere classified  Pain in right hip     Problem List Patient Active Problem List   Diagnosis Date Noted  . Port-A-Cath in place 08/01/2019  . Morbid obesity with body mass index of 50 or higher ([place]) 06/20/2019  . Genetic testing 04/19/2019  . 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 Family history of breast cancer   . Malignant neoplasm of upper-outer quadrant of left breast in female, estrogen receptor positive ([place]) 04/05/2019  . Flat foot 03/12/2018  . 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 Fibroids, intramural 02/17/2017  . Menorrhagia 02/17/2017  . S/P hysterectomy 02/17/2017    Debbe Bales,  Luther Bradley, PTA 09/30/2019, 7:02 PM  [place], [place], [address] Phone: [phone]    Fax:  [phone]  Name: Loretta Nelson MRN: [id number] Date of Birth: 1966-02-01

## 2019-10-02 ENCOUNTER — Encounter: Payer: BC Managed Care – PPO | Admitting: Physical Therapy

## 2019-10-08 ENCOUNTER — Other Ambulatory Visit: Payer: Self-pay

## 2019-10-08 ENCOUNTER — Encounter: Payer: Self-pay | Admitting: Physical Therapy

## 2019-10-08 ENCOUNTER — Ambulatory Visit: Payer: BC Managed Care – PPO | Admitting: Physical Therapy

## 2019-10-08 DIAGNOSIS — R293 Abnormal posture: Secondary | ICD-10-CM | POA: Diagnosis not present

## 2019-10-08 DIAGNOSIS — Z483 Aftercare following surgery for neoplasm: Secondary | ICD-10-CM

## 2019-10-08 DIAGNOSIS — M25551 Pain in right hip: Secondary | ICD-10-CM

## 2019-10-08 DIAGNOSIS — L599 Disorder of the skin and subcutaneous tissue related to radiation, unspecified: Secondary | ICD-10-CM

## 2019-10-08 DIAGNOSIS — I890 Lymphedema, not elsewhere classified: Secondary | ICD-10-CM

## 2019-10-08 NOTE — Therapy (Signed)
[place], [place], [address] Phone: [phone]   Fax:  [phone]  Physical Therapy Treatment  Patient Details  Name: Loretta Nelson MRN: [id number] Date of Birth: 06-Aug-1965 Referring Provider (PT): Shona Simpson,  Benjiman Core    Encounter Date: 10/08/2019   PT End of Session - 10/08/19 1655    Visit Number 7    Number of Visits 18    Date for PT Re-Evaluation 11/18/19    PT Start Time 5597    PT Stop Time 1645    PT Time Calculation (min) 40 min    Activity Tolerance Patient tolerated treatment well    Behavior During Therapy [facility] for tasks assessed/performed           Past Medical History:  Diagnosis Date  . Allergic rhinitis   . Anemia    low iron  . Anemia   . Anxiety   . Anxiety   . Arthritis    right knee, surgery done  . Cancer ([place]) 2021   left breast   . Complication of anesthesia    woke up during EGD  . Esophageal stricture   . Family history of breast cancer   . GERD (gastroesophageal reflux disease)   . Graves disease 01/13/11   radioactive iodine treatment, 41.6 millicuries  . Headache    tension headaches  . Hiatal hernia    small  . Hypertension   . Hyperthyroidism   . Hypothyroidism    s/p treatment  . Murmur    benign, h/o, caused by hyperdynamic contraction  . Obesity   . Pneumonia 2017   inhaler prescribed  . Sleep apnea 2021   no CPAP as yet, last study done lastnight 04/15/2019  . Tendonitis of shoulder, right   . Vitamin D deficiency   . Vitamin D deficiency     Past Surgical History:  Procedure Laterality Date  . BALLOON DILATION N/A 09/07/2016   Procedure: BALLOON DILATION;  Surgeon: Arta Silence, MD;  Location: [facility] END[facility];  Service: Endoscopy;  Laterality: N/A;  . BREAST LUMPECTOMY WITH RADIOACTIVE SEED AND SENTINEL LYMPH NODE BIOPSY Left 04/17/2019   Procedure: LEFT BREAST LUMPECTOMY WITH RADIOACTIVE SEED AND SENTINEL LYMPH NODE  BIOPSY;  Surgeon: Donnie Mesa, MD;  Location: [place];  Service: General;  Laterality: Left;  LMA, PEC BLOCK  . CESAREAN SECTION  1999  . COLONOSCOPY    . ESOPHAGOGASTRODUODENOSCOPY    . ESOPHAGOGASTRODUODENOSCOPY (EGD) WITH PROPOFOL N/A 09/07/2016   Procedure: ESOPHAGOGASTRODUODENOSCOPY (EGD) WITH PROPOFOL;  Surgeon: Arta Silence, MD;  Location: [place];  Service: Endoscopy;  Laterality: N/A;  . HYSTERECTOMY ABDOMINAL WITH SALPINGECTOMY Bilateral 02/17/2017   Procedure: HYSTERECTOMY ABDOMINAL WITH SALPINGECTOMY;  Surgeon: Christophe Louis, MD;  Location: [facility];  Service: Gynecology;  Laterality: Bilateral;  . KNEE ARTHROSCOPY     for torn meniscus  . PORTACATH PLACEMENT Right 05/22/2019   Procedure: INSERTION PORT-A-CATH WITH ULTRASOUND GUIDANCE;  Surgeon: Donnie Mesa, MD;  Location: [facility];  Service: General;  Laterality: Right;  Endotrachial tube  . WISDOM TOOTH EXTRACTION    . WISDOM TOOTH EXTRACTION      There were no vitals filed for this visit.   Subjective Assessment - 10/08/19 1606    Subjective Pt is back to work this week and comes in very exhausted and stressed    Pertinent History Patient was diagnosed 03/19/2019 with left grade III invasive ductal carcinoma breast cancer. Patient reports she underwent a left lumpectomy and sentinel  node biopsy on 04/17/2019 with 2 negative nodes removed. It is triple negative with a Ki67 of 30%. Chemotherapy and radiation, past history includes right knee pain    Patient Stated Goals Recheck and see if my arm is ok    Currently in Pain? No/denies                             [facility] Adult PT Treatment/Exercise - 10/08/19 0001      Manual Therapy   Manual Therapy Manual Lymphatic Drainage (MLD)    Manual therapy comments instruced in basics of MLD    Manual Lymphatic Drainage (MLD) short neck on left side ( avoided port) superficial and deep abdoinals, left upper chest, breast ( being careful on radiated skin) and  adominals, left axilla arm to hand and retrun along pathways, the to right sidelying for posterior interaxillary anastamosis and more work on lateral chest.                     PT Short Term Goals - 09/17/19 2035      PT SHORT TERM GOAL #1   Title Pt will report the pain in her right hip has decreased to 7/10 at the most    Baseline 10/10 at times on 09/17/2019    Time 4    Period Weeks    Status New      PT SHORT TERM GOAL #2   Title Pt will be independent in a home exericse pain for lumbar mobiity and core strengthening    Time 4    Period Weeks    Status New             PT Long Term Goals - 09/17/19 2037      PT LONG TERM GOAL #1   Title Pt will be independent in managment of left breast lymphedem post radiation    Time 8    Period Weeks    Status New      PT LONG TERM GOAL #2   Title Pt will report the pain and dicomfort in her left upper quadrant is decreased by 50%    Time 8    Period Weeks    Status New                 Plan - 10/08/19 1655    Clinical Impression Statement Pt felt very relaxed at end of session and breat appeared to be less congested    Personal Factors and Comorbidities Comorbidity 3+    Comorbidities breast cancer,sugery,  chemotherapy, radiation, back and hip pain, right knee pain    Stability/Clinical Decision Making Evolving/Moderate complexity    Rehab Potential Excellent    PT Frequency 2x / week    PT Duration 8 weeks    PT Treatment/Interventions ADLs/Self Care Home Management;Therapeutic exercise;Manual techniques;Patient/family education;Joint Manipulations;Passive range of motion;Therapeutic activities;Manual lymph drainage;Moist Heat;Orthotic Fit/Training;Compression bandaging;Taping    PT Next Visit Plan Assess skin of Lt breast to determine if okay to begin manual lymph drainage;cont MLD as indicated, help pt get a compression sleeve any questions about TENS? Review new HEP and try on Airex for incresaed challenge;  Cont moist heat and soft tissue work to low back and right hip, progress core mobility reviewing new HEP issued today; add hip abductor work in standing with core engaged           Patient will benefit from skilled therapeutic intervention in  order to improve the following deficits and impairments:  Postural dysfunction, Decreased range of motion, Pain, Impaired UE functional use, Decreased knowledge of precautions, Increased edema, Obesity, Impaired flexibility  Visit Diagnosis: Abnormal posture  Disorder of the skin and subcutaneous tissue related to radiation, unspecified  Lymphedema, not elsewhere classified  Pain in right hip  Aftercare following surgery for neoplasm     Problem List Patient Active Problem List   Diagnosis Date Noted  . Port-A-Cath in place 08/01/2019  . Morbid obesity with body mass index of 50 or higher ([place]) 06/20/2019  . Genetic testing 04/19/2019  . 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 Family history of breast cancer   . Malignant neoplasm of upper-outer quadrant of left breast in female, estrogen receptor positive ([place]) 04/05/2019  . Flat foot 03/12/2018  . 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 Fibroids, intramural 02/17/2017  . Menorrhagia 02/17/2017  . S/P hysterectomy 02/17/2017   Donato Heinz. Owens Shark PT  Norwood Levo 10/08/2019, 4:57 PM  [place], [place], [address] Phone: [phone]   Fax:  [phone]  Name: Loretta Nelson MRN: [id number] Date of Birth: September 05, 1965

## 2019-10-10 ENCOUNTER — Encounter: Payer: BC Managed Care – PPO | Admitting: Physical Therapy

## 2019-10-12 NOTE — Progress Notes (Signed)
°  Radiation Oncology         [phone] ________________________________  Name: Loretta Nelson MRN: [id number]  Date: 09/27/2019  DOB: 1966-01-05  End of Treatment Note  Diagnosis:   left-sided breast cancer     Indication for treatment:  Curative       Radiation treatment dates:   08/13/19 - 09/27/19  Site/dose:   The patient initially received a dose of 50.4 Gy in 28 fractions to the breast using whole-breast tangent fields. This was delivered using a 3-D conformal technique. The patient then received a boost to the seroma. This delivered an additional 10 Gy in 5 fractions using a 3-field photon boost technique. The total dose was 60.4 Gy.  Narrative: The patient tolerated radiation treatment relatively well.   The patient had some expected skin irritation as she progressed during treatment.    Plan: The patient has completed radiation treatment. The patient will return to radiation oncology clinic for routine followup in one month. I advised the patient to call or return sooner if they have any questions or concerns related to their recovery or treatment. ________________________________  Jodelle Gross, M.D., Ph.D.

## 2019-10-15 ENCOUNTER — Encounter: Payer: BC Managed Care – PPO | Admitting: Physical Therapy

## 2019-10-16 ENCOUNTER — Encounter: Payer: BC Managed Care – PPO | Admitting: Physical Therapy

## 2019-10-17 ENCOUNTER — Encounter: Payer: Self-pay | Admitting: Physical Therapy

## 2019-10-17 ENCOUNTER — Other Ambulatory Visit: Payer: Self-pay

## 2019-10-17 ENCOUNTER — Ambulatory Visit: Payer: BC Managed Care – PPO | Admitting: Physical Therapy

## 2019-10-17 DIAGNOSIS — L599 Disorder of the skin and subcutaneous tissue related to radiation, unspecified: Secondary | ICD-10-CM

## 2019-10-17 DIAGNOSIS — R293 Abnormal posture: Secondary | ICD-10-CM | POA: Diagnosis not present

## 2019-10-17 DIAGNOSIS — I890 Lymphedema, not elsewhere classified: Secondary | ICD-10-CM

## 2019-10-17 NOTE — Therapy (Signed)
[place], [place], [address] Phone: [phone]   Fax:  [phone]  Physical Therapy Treatment  Patient Details  Name: Loretta Nelson MRN: [id number] Date of Birth: November 29, 1965 Referring Provider (PT): Shona Simpson,  Benjiman Core    Encounter Date: 10/17/2019   PT End of Session - 10/17/19 1712    Visit Number 8    Number of Visits 18    Date for PT Re-Evaluation 11/18/19    PT Start Time 7341    PT Stop Time 1700    PT Time Calculation (min) 45 min    Activity Tolerance Patient tolerated treatment well    Behavior During Therapy [facility] for tasks assessed/performed           Past Medical History:  Diagnosis Date   Allergic rhinitis    Anemia    low iron   Anemia    Anxiety    Anxiety    Arthritis    right knee, surgery done   Cancer [facility]) 2021   left breast    Complication of anesthesia    woke up during EGD   Esophageal stricture    Family history of breast cancer    GERD (gastroesophageal reflux disease)    Graves disease 01/13/11   radioactive iodine treatment, 93.7 millicuries   Headache    tension headaches   Hiatal hernia    small   Hypertension    Hyperthyroidism    Hypothyroidism    s/p treatment   Murmur    benign, h/o, caused by hyperdynamic contraction   Obesity    Pneumonia 2017   inhaler prescribed   Sleep apnea 2021   no CPAP as yet, last study done lastnight 04/15/2019   Tendonitis of shoulder, right    Vitamin D deficiency    Vitamin D deficiency     Past Surgical History:  Procedure Laterality Date   BALLOON DILATION N/A 09/07/2016   Procedure: BALLOON DILATION;  Surgeon: Arta Silence, MD;  Location: [place];  Service: Endoscopy;  Laterality: N/A;   BREAST LUMPECTOMY WITH RADIOACTIVE SEED AND SENTINEL LYMPH NODE BIOPSY Left 04/17/2019   Procedure: LEFT BREAST LUMPECTOMY WITH RADIOACTIVE SEED AND SENTINEL LYMPH NODE  BIOPSY;  Surgeon: Donnie Mesa, MD;  Location: [place];  Service: General;  Laterality: Left;  LMA, PEC BLOCK   CESAREAN SECTION  1999   COLONOSCOPY     ESOPHAGOGASTRODUODENOSCOPY     ESOPHAGOGASTRODUODENOSCOPY (EGD) WITH PROPOFOL N/A 09/07/2016   Procedure: ESOPHAGOGASTRODUODENOSCOPY (EGD) WITH PROPOFOL;  Surgeon: Arta Silence, MD;  Location: [place];  Service: Endoscopy;  Laterality: N/A;   HYSTERECTOMY ABDOMINAL WITH SALPINGECTOMY Bilateral 02/17/2017   Procedure: HYSTERECTOMY ABDOMINAL WITH SALPINGECTOMY;  Surgeon: Christophe Louis, MD;  Location: [facility];  Service: Gynecology;  Laterality: Bilateral;   KNEE ARTHROSCOPY     for torn meniscus   PORTACATH PLACEMENT Right 05/22/2019   Procedure: INSERTION PORT-A-CATH WITH ULTRASOUND GUIDANCE;  Surgeon: Donnie Mesa, MD;  Location: [facility];  Service: General;  Laterality: Right;  Endotrachial tube   WISDOM TOOTH EXTRACTION     WISDOM TOOTH EXTRACTION      There were no vitals filed for this visit.   Subjective Assessment - 10/17/19 1707    Subjective Pt states she is doing much better.  She still has some pain in her back and anterior thigh, but the pelvic tilts seem to really help her.  She comes in with a jovi pack inframammary pad and wants to  know how to use it.    Pertinent History Patient was diagnosed 03/19/2019 with left grade III invasive ductal carcinoma breast cancer. Patient reports she underwent a left lumpectomy and sentinel node biopsy on 04/17/2019 with 2 negative nodes removed. It is triple negative with a Ki67 of 30%. Chemotherapy and radiation, past history includes right knee pain    Patient Stated Goals Recheck and see if my arm is ok    Currently in Pain? No/denies                             [facility] Adult PT Treatment/Exercise - 10/17/19 0001      Self-Care   Self-Care Other Self-Care Comments    Other Self-Care Comments  reviewed sitting back stretch forward leaning on elbows and standing  pelvic tilts to relieve back discomfort during work Pt is pleased by how much more active she is going back to work and [employer] gets 5000-7000 steps per day       Manual Therapy   Manual Therapy Edema management;Manual Lymphatic Drainage (MLD)    Manual therapy comments gave pt information about how to call [facility] for a compression bra and asked Bary Castilla to send a prescription     Edema Management showed pt how to sue swell spot in compression bra     Manual Lymphatic Drainage (MLD) short neck on left side ( avoided port) superficial and deep abdoinals, left upper chest, breast ( being careful on radiated skin) and adominals, left axilla arm to hand and retrun along pathways, the to right sidelying for posterior interaxillary anastamosis and more work on lateral chest.                   PT Education - 10/17/19 1711    Education Details where to get compressio bra and how to use swell spot    Person(s) Educated Patient    Methods Explanation;Handout    Comprehension Verbalized understanding;Returned demonstration            PT Short Term Goals - 09/17/19 2035      PT SHORT TERM GOAL #1   Title Pt will report the pain in her right hip has decreased to 7/10 at the most    Baseline 10/10 at times on 09/17/2019    Time 4    Period Weeks    Status New      PT SHORT TERM GOAL #2   Title Pt will be independent in a home exericse pain for lumbar mobiity and core strengthening    Time 4    Period Weeks    Status New             PT Long Term Goals - 09/17/19 2037      PT LONG TERM GOAL #1   Title Pt will be independent in managment of left breast lymphedem post radiation    Time 8    Period Weeks    Status New      PT LONG TERM GOAL #2   Title Pt will report the pain and dicomfort in her left upper quadrant is decreased by 50%    Time 8    Period Weeks    Status New                 Plan - 10/17/19 1714    Clinical Impression Statement Pt  appears to have improvment in breast lymphedema.  Feel she would benefit from a compression bra that will accommodate her swell spot and decrease circumferential tightness around her trunk    Comorbidities breast cancer,sugery,  chemotherapy, radiation, back and hip pain, right knee pain    Stability/Clinical Decision Making Evolving/Moderate complexity    Rehab Potential Excellent    PT Frequency 2x / week    PT Duration 8 weeks    PT Treatment/Interventions ADLs/Self Care Home Management;Therapeutic exercise;Manual techniques;Patient/family education;Joint Manipulations;Passive range of motion;Therapeutic activities;Manual lymph drainage;Moist Heat;Orthotic Fit/Training;Compression bandaging;Taping    PT Next Visit Plan Assess goals, See if pt got an appt for comrpession bra ;cont MLD as indicated, help pt get a compression sleeve any questions about TENS? Review new HEP and try on Airex for incresaed challenge; Cont moist heat and soft tissue work to low back and right hip, progress core mobility reviewing new HEP issued today; add hip abductor work in standing with core engaged    PT Home Exercise Plan lumbar mobility exercise, hip flexibility; core stabs    Consulted and Agree with Plan of Care Patient           Patient will benefit from skilled therapeutic intervention in order to improve the following deficits and impairments:  Postural dysfunction, Decreased range of motion, Pain, Impaired UE functional use, Decreased knowledge of precautions, Increased edema, Obesity, Impaired flexibility  Visit Diagnosis: Abnormal posture  Disorder of the skin and subcutaneous tissue related to radiation, unspecified  Lymphedema, not elsewhere classified     Problem List Patient Active Problem List   Diagnosis Date Noted   Port-A-Cath in place 08/01/2019   Morbid obesity with body mass index of 50 or higher ([place]) 06/20/2019   Genetic testing 04/19/2019   BARD1 gene mutation positive  04/19/2019   Family history of breast cancer    Malignant neoplasm of upper-outer quadrant of left breast in female, estrogen receptor positive ([place]) 04/05/2019   Flat foot 03/12/2018   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 Owens Shark PT  Norwood Levo 10/17/2019, 5:17 PM  [place], [place], [address] Phone: [phone]   Fax:  [phone]  Name: Ronelle Michie MRN: [id number] Date of Birth: 1965-05-23

## 2019-10-17 NOTE — Patient Instructions (Signed)
First of all, check with your insurance company to see if provider is in network    [facility] (for wigs and compression sleeves / gloves/gauntlets )  [address] [place], [place] [address] [phone]  Will file some insurances --- call for appointment   [facility] (for mastectomy prosthetics and garments) [address] [place], [place] [address] [phone] Will file some insurances --- call for appointment  [facility]  [address]  [place], [place] [address] [phone] Lower extremity garments  [facility] [address] [place], [place]  [address] [phone]  Cathy Rubel ( Medicaid certified lymphedema fitter) [phone] [email or web address]  Melissa Meares  [facility]  [phone]  [facility] [address] [place], [place] [address] [phone]  Other Resources: National Lymphedema Network:  www.lymphnet.org www.Klosetraining.com for patient articles and self manual lymph drainage information www.lymphedemablog.com has informative articles.  www.compressionguru.com www.lymphedemaproducts.com www.brightlifedirect.com www.compressionguru.com 

## 2019-10-21 ENCOUNTER — Encounter: Payer: Self-pay | Admitting: Physical Therapy

## 2019-10-21 ENCOUNTER — Telehealth: Payer: Self-pay | Admitting: Radiation Oncology

## 2019-10-21 ENCOUNTER — Ambulatory Visit: Payer: BC Managed Care – PPO | Admitting: Physical Therapy

## 2019-10-21 ENCOUNTER — Other Ambulatory Visit: Payer: Self-pay

## 2019-10-21 DIAGNOSIS — R293 Abnormal posture: Secondary | ICD-10-CM | POA: Diagnosis not present

## 2019-10-21 DIAGNOSIS — I890 Lymphedema, not elsewhere classified: Secondary | ICD-10-CM

## 2019-10-21 NOTE — Therapy (Signed)
[facility] [address] [place], [place], [address] Phone: [phone]   Fax:  [phone]  Physical Therapy Treatment  Patient Details  Name: Loretta Nelson MRN: [id number] Date of Birth: 06-18-1965 Referring Provider (PT): Laurence Aly,  Roslynn Amble    Encounter Date: 10/21/2019   PT End of Session - 10/21/19 1700    Visit Number 9    Number of Visits 18    Date for PT Re-Evaluation 11/18/19    PT Start Time 1607    PT Stop Time 1700    PT Time Calculation (min) 53 min    Activity Tolerance Patient tolerated treatment well    Behavior During Therapy [facility] for tasks assessed/performed           Past Medical History:  Diagnosis Date  . Allergic rhinitis   . Anemia    low iron  . Anemia   . Anxiety   . Anxiety   . Arthritis    right knee, surgery done  . Cancer (HCC) 2021   left breast   . Complication of anesthesia    woke up during EGD  . Esophageal stricture   . Family history of breast cancer   . GERD (gastroesophageal reflux disease)   . Graves disease 01/13/11   radioactive iodine treatment, 14.6 millicuries  . Headache    tension headaches  . Hiatal hernia    small  . Hypertension   . Hyperthyroidism   . Hypothyroidism    s/p treatment  . Murmur    benign, h/o, caused by hyperdynamic contraction  . Obesity   . Pneumonia 2017   inhaler prescribed  . Sleep apnea 2021   no CPAP as yet, last study done lastnight 04/15/2019  . Tendonitis of shoulder, right   . Vitamin D deficiency   . Vitamin D deficiency     Past Surgical History:  Procedure Laterality Date  . BALLOON DILATION N/A 09/07/2016   Procedure: BALLOON DILATION;  Surgeon: Willis Modena, MD;  Location: [facility] ENDOSCOPY;  Service: Endoscopy;  Laterality: N/A;  . BREAST LUMPECTOMY WITH RADIOACTIVE SEED AND SENTINEL LYMPH NODE BIOPSY Left 04/17/2019   Procedure: LEFT BREAST LUMPECTOMY WITH RADIOACTIVE SEED AND SENTINEL LYMPH NODE  BIOPSY;  Surgeon: Manus Rudd, MD;  [facility];  Service: General;  Laterality: Left;  LMA, PEC BLOCK  . CESAREAN SECTION  1999  . COLONOSCOPY    . ESOPHAGOGASTRODUODENOSCOPY    . ESOPHAGOGASTRODUODENOSCOPY (EGD) WITH PROPOFOL N/A 09/07/2016   Procedure: ESOPHAGOGASTRODUODENOSCOPY (EGD) WITH PROPOFOL;  Surgeon: Willis Modena, MD;  Location: [facility] ENDOSCOPY;  Service: Endoscopy;  Laterality: N/A;  . HYSTERECTOMY ABDOMINAL WITH SALPINGECTOMY Bilateral 02/17/2017   Procedure: HYSTERECTOMY ABDOMINAL WITH SALPINGECTOMY;  Surgeon: Gerald Leitz, MD;  Location: [facility];  Service: Gynecology;  Laterality: Bilateral;  . KNEE ARTHROSCOPY     for torn meniscus  . PORTACATH PLACEMENT Right 05/22/2019   Procedure: INSERTION PORT-A-CATH WITH ULTRASOUND GUIDANCE;  Surgeon: Manus Rudd, MD;  Location[facility];  Service: General;  Laterality: Right;  Endotrachial tube  . WISDOM TOOTH EXTRACTION    . WISDOM TOOTH EXTRACTION      There were no vitals filed for this visit.   Subjective Assessment - 10/21/19 1608    Subjective The hip and leg pain are doing better. I think the priority right now is the swelling. It got worse yesterday. I am getting measured for a compression bra this Friday.    Pertinent History Patient was diagnosed 03/19/2019 with  left grade III invasive ductal carcinoma breast cancer. Patient reports she underwent a left lumpectomy and sentinel node biopsy on 04/17/2019 with 2 negative nodes removed. It is triple negative with a Ki67 of 30%. Chemotherapy and radiation, past history includes right knee pain    Patient Stated Goals Recheck and see if my arm is ok    Currently in Pain? No/denies    Pain Score 0-No pain                             [facility] Adult PT Treatment/Exercise - 10/21/19 0001      Manual Therapy   Manual Lymphatic Drainage (MLD) in supine with head of bed elevated: short neck, superficial and deep abdominals, right axillary nodes and establishment of  interaxillary pathway, left inguinal nodes and establishment of axillo inguinal pathway, R breast moving fluid towards pathways then to L sidelying to work on L lateral breast and posterior interaxillary pathway then back to supine to complete pathways                    PT Short Term Goals - 10/21/19 1611      PT SHORT TERM GOAL #1   Title Pt will report the pain in her right hip has decreased to 7/10 at the most    Baseline 10/10 at times on 09/17/2019, 10/21/19- 5/10    Time 4    Period Weeks    Status Achieved      PT SHORT TERM GOAL #2   Title Pt will be independent in a home exericse pain for lumbar mobiity and core strengthening    Time 4    Period Weeks    Status On-going             PT Long Term Goals - 10/21/19 1612      PT LONG TERM GOAL #1   Title Pt will be independent in managment of left breast lymphedem post radiation    Baseline 10/21/19- pt getting compression bra on Friday    Time 8    Period Weeks    Status On-going      PT LONG TERM GOAL #2   Title Pt will report the pain and dicomfort in her left upper quadrant is decreased by 50%    Baseline 10/21/19- pt reports that the pain is sitll intermittent    Time 8    Period Weeks    Status On-going      PT LONG TERM GOAL #3   Title Patient will demonstrate and verbalize proper sitting posture for decreased strain on neck and scapula while working on her computer.    Time 8    Period Weeks    Status On-going      PT LONG TERM GOAL #4   Title Patient will improve her DASH score to be </= 8 for increased function of right upper extremity.    Time 8    Period Weeks    Status On-going      PT LONG TERM GOAL #5   Title Patient will demonstrate she has regained left shoulder ROM and function post operatively compared to baselines.    Time 8    Period Weeks    Status Achieved                 Plan - 10/21/19 1700    Clinical Impression Statement Assessed pt's progress towards goals in  therapy.  Pt has met short term goal for pain and is progressing towards all goals in therapy. Focused today on MLD to L breast to help reduce swelling. Pt feels that her breast was swelling more over the weeked. She is getting measured for a compression bra on Friday. Will instruct pt in self MLD at next session.    PT Frequency 2x / week    PT Duration 8 weeks    PT Treatment/Interventions ADLs/Self Care Home Management;Therapeutic exercise;Manual techniques;Patient/family education;Joint Manipulations;Passive range of motion;Therapeutic activities;Manual lymph drainage;Moist Heat;Orthotic Fit/Training;Compression bandaging;Taping    PT Next Visit Plan teach self MLD, MLD as indicated, help pt get a compression sleeve any questions about TENS? Review new HEP and try on Airex for incresaed challenge; Cont moist heat and soft tissue work to low back and right hip, progress core mobility reviewing new HEP issued today; add hip abductor work in standing with core engaged    PT Home Exercise Plan lumbar mobility exercise, hip flexibility; core stabs    Consulted and Agree with Plan of Care Patient           Patient will benefit from skilled therapeutic intervention in order to improve the following deficits and impairments:  Postural dysfunction, Decreased range of motion, Pain, Impaired UE functional use, Decreased knowledge of precautions, Increased edema, Obesity, Impaired flexibility  Visit Diagnosis: Lymphedema, not elsewhere classified     Problem List Patient Active Problem List   Diagnosis Date Noted  . Port-A-Cath in place 08/01/2019  . Morbid obesity with body mass index of 50 or higher ([place]) 06/20/2019  . Genetic testing 04/19/2019  . 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 Family history of breast cancer   . Malignant neoplasm of upper-outer quadrant of left breast in female, estrogen receptor positive ([place]) 04/05/2019  . Flat foot 03/12/2018  . Primary osteoarthritis of  right foot 03/12/2018  . Fibroids, intramural 02/17/2017  . Menorrhagia 02/17/2017  . S/P hysterectomy 02/17/2017    Allyson Sabal [facility] 10/21/2019, 5:07 PM  [place], [place], [address] Phone: [phone]   Fax:  [phone]  Name: Araseli Sherry MRN: [id number] Date of Birth: 06/05/1965  Manus Gunning, PT 10/21/19 5:07 PM

## 2019-10-21 NOTE — Telephone Encounter (Signed)
  Radiation Oncology         [phone]) [phone] ________________________________  Name: Loretta Nelson MRN: [id number]  Date of Service: 10/21/2019  DOB: 1965-06-27  Post Treatment Telephone Note  Diagnosis:  Stage IB pT1cN0M0,Grade 3, Triple negative invasive ductal carcinoma of the left breast.  Interval Since Last Radiation:  4 weeks   08/13/19 - 09/27/19: The patient initially received a dose of 50.4 Gy in 28 fractions to the breast using whole-breast tangent fields. This was delivered using a 3-D conformal technique. The patient then received a boost to the seroma. This delivered an additional 10 Gy in 5 fractions using a 3-field photon boost technique. The total dose was 60.4 Gy.  Narrative:  The patient was contacted today for routine follow-up. During treatment she did very well with radiotherapy and did not have significant desquamation.  Impression/Plan: 1. Stage IB pT1cN0M0,Grade 3, Triple negative invasive ductal carcinoma of the left breast. I could not reach the patient but left a message. On the message I discussed skin care considerations, and that we would be happy to continue to follow her as needed, but she will also continue to follow up with Dr. Jana Hakim in medical oncology.     Carola Rhine, PAC

## 2019-10-22 ENCOUNTER — Encounter: Payer: BC Managed Care – PPO | Admitting: Physical Therapy

## 2019-10-23 ENCOUNTER — Encounter: Payer: BC Managed Care – PPO | Admitting: Physical Therapy

## 2019-10-23 NOTE — Progress Notes (Signed)
Loretta Nelson  [phone] [phone]     ID: Loretta Nelson DOB: [birth year]  MR#: [id number]  IRS#:[id number]  Patient Care Team: [name], MD as PCP - General (Family Medicine) Mauro Kaufmann, RN as Oncology Nurse Navigator Rockwell Germany, RN as Oncology Nurse Navigator Donnie Mesa, MD as Consulting Physician (General Surgery) Rowyn Mustapha, Virgie Dad, MD as Consulting Physician (Oncology) Kyung Rudd, MD as Consulting Physician (Radiation Oncology) Delrae Rend, MD as Consulting Physician (Endocrinology) Star Age, MD as Consulting Physician (Neurology) Garvin Fila, MD as Consulting Physician (Neurology) Christophe Louis, MD as Consulting Physician (Obstetrics and Gynecology) Arta Silence, MD as Consulting Physician (Gastroenterology) Chauncey Cruel, MD OTHER MD:  CHIEF COMPLAINT: triple negative breast cancer  CURRENT TREATMENT: Observation/intensified screening   INTERVAL HISTORY: [name] returns today for follow up of her triple negative breast cancer.    She was referred back to Dr. Lisbeth Renshaw on 08/06/2019 to discuss adjuvant radiation therapy.She subsequently received treatment from 08/13/2019 through 09/27/2019.  She went back to work a week after completing radiation and really felt quite exhausted.  She is now getting her energy back.  She is doing physical therapy which is helping.  She is being fitted for a compression sleeve and also a compression bra  REVIEW OF SYSTEMS: Angellica feels she is gaining her energy back.  She has a little bit of discomfort in the surgical breast but understands that this is related to the treatment and not to recurrent breast cancer.  She does have pain in the right hip area.  She tried gabapentin for that but it made her very anxious so she stopped that medication.  She had a little bit of ankle swelling but that has resolved.  She would like to have her ovaries removed just in case there is an  increased risk of ovarian cancer.  A detailed review of systems today was otherwise stable   HISTORY OF CURRENT ILLNESS: From the original intake note:  Loretta Nelson had routine screening mammography on 03/19/2019 showing a possible abnormality in the left breast. She underwent left diagnostic mammography with tomography and left breast ultrasonography at [facility] on 03/29/2019 showing: breast density category B; 8 mm mass in left breast at 12 o'clock; left axilla negative for lymphadenopathy.  Accordingly on 04/03/2019 she proceeded to biopsy of the left breast area in question. The pathology from this procedure ([id number]) showed: invasive mammary carcinoma, grade 3, e-cadherin positive. Prognostic indicators significant for: estrogen receptor, 10% positive with weak staining intensity and progesterone receptor, 0% negative. Proliferation marker Ki67 at 30%. HER2 negative by immunohistochemistry (1+).  The patient's subsequent history is as detailed below.   PAST MEDICAL HISTORY: Past Medical History:  Diagnosis Date   Allergic rhinitis    Anemia    low iron   Anemia    Anxiety    Anxiety    Arthritis    right knee, surgery done   Cancer [facility]) 2021   left breast    Complication of anesthesia    woke up during EGD   Esophageal stricture    Family history of breast cancer    GERD (gastroesophageal reflux disease)    Graves disease 01/13/11   radioactive iodine treatment, 93.7 millicuries   Headache    tension headaches   Hiatal hernia    small   Hypertension    Hyperthyroidism    Hypothyroidism    s/p treatment   Murmur    benign, h/o,  caused by hyperdynamic contraction   Obesity    Pneumonia 2017   inhaler prescribed   Sleep apnea 2021   no CPAP as yet, last study done lastnight 04/15/2019   Tendonitis of shoulder, right    Vitamin D deficiency    Vitamin D deficiency     PAST SURGICAL HISTORY: Past Surgical History:    Procedure Laterality Date   BALLOON DILATION N/A 09/07/2016   Procedure: BALLOON DILATION;  Surgeon: Arta Silence, MD;  Location: [facility] ENDOSCOPY;  Service: Endoscopy;  Laterality: N/A;   BREAST LUMPECTOMY WITH RADIOACTIVE SEED AND SENTINEL LYMPH NODE BIOPSY Left 04/17/2019   Procedure: LEFT BREAST LUMPECTOMY WITH RADIOACTIVE SEED AND SENTINEL LYMPH NODE BIOPSY;  Surgeon: Donnie Mesa, MD;  Location: [place];  Service: General;  Laterality: Left;  LMA, PEC BLOCK   CESAREAN SECTION  1999   COLONOSCOPY     ESOPHAGOGASTRODUODENOSCOPY     ESOPHAGOGASTRODUODENOSCOPY (EGD) WITH PROPOFOL N/A 09/07/2016   Procedure: ESOPHAGOGASTRODUODENOSCOPY (EGD) WITH PROPOFOL;  Surgeon: Arta Silence, MD;  Location: [place];  Service: Endoscopy;  Laterality: N/A;   HYSTERECTOMY ABDOMINAL WITH SALPINGECTOMY Bilateral 02/17/2017   Procedure: HYSTERECTOMY ABDOMINAL WITH SALPINGECTOMY;  Surgeon: Christophe Louis, MD;  Location: [facility];  Service: Gynecology;  Laterality: Bilateral;   KNEE ARTHROSCOPY     for torn meniscus   PORTACATH PLACEMENT Right 05/22/2019   Procedure: INSERTION PORT-A-CATH WITH ULTRASOUND GUIDANCE;  Surgeon: Donnie Mesa, MD;  Location: [facility];  Service: General;  Laterality: Right;  Endotrachial tube   WISDOM TOOTH EXTRACTION     WISDOM TOOTH EXTRACTION      FAMILY HISTORY: Family History  Problem Relation Age of Onset   Hypertension Mother    CAD Maternal Grandfather    Breast cancer Paternal Grandmother    HIV Brother    Heart attack Brother        sudden MI vs PE   Breast cancer Sister 90   Breast cancer Other        one dx 54s, others dx in 62s   Colon polyps Neg Hx    Colon cancer Neg Hx    Liver disease Neg Hx    Patient's father was in his late 81s when he died from causes unknown to the patient.  Patient's mother is 92 years old (as of 04/2019). The patient denies a family hx of ovarian cancer. She reports breast cancer in her sister at age 79, in her  paternal grandmother, and in 3 paternal cousins. She had 4 siblings, 2 brothers and 2 sisters, though only her sisters are still living.   GYNECOLOGIC HISTORY:  Patient's last menstrual period was 02/13/2017 (exact date). Menarche: 54 years old Age at first live birth: 54 years old [place] P 2 (twins) LMP 01/2017 Contraceptive: used from 1027-2536 without complication HRT never used  Hysterectomy? Yes, 01/2017, benign pathology BSO? 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 She is divorced. She lives at home with her twin daughters, Maryjo Rochester and Edmon Crape, who are 54 years old fraternal twins and attending college remotely. Mekaila studied music and communications at [employer].Edmon Crape graduated in IT and is currently working for [employer].   ADVANCED DIRECTIVES: Not in place; the patient intends to name her sister Madelon Lips as her HCPOA [phone])   HEALTH MAINTENANCE: Social History   Tobacco Use   Smoking status: Never Smoker   Smokeless tobacco: Never Used  Vaping Use   Vaping Use: Never used  Substance Use Topics   Alcohol use: No    Alcohol/week: 0.0 standard drinks   Drug use: No     Colonoscopy: at age 52  PAP: 02/2018  Bone density: never done   No Known Allergies  Current Outpatient Medications  Medication Sig Dispense Refill   amLODipine (NORVASC) 5 MG tablet Take 5 mg by mouth daily.  (Patient not taking: Reported on 09/17/2019)  5   anastrozole (ARIMIDEX) 1 MG tablet Take 1 tablet (1 mg total) by mouth daily. 90 tablet 4   Cholecalciferol 4000 UNITS CAPS Take 4,000 Units by mouth daily.      citalopram (CELEXA) 40 MG tablet Take 40 mg by mouth daily.     dexamethasone (DECADRON) 4 MG tablet Take 2 tablets (8 mg total) by mouth 2 (two) times daily. Start the day before Taxotere. Then again the day after chemo for 3 days. (Patient not taking:  Reported on 08/06/2019) 30 tablet 1   EDARBYCLOR 40-25 MG TABS Take 1 tablet by mouth daily.  5   ibuprofen (ADVIL) 200 MG tablet Take 400 mg by mouth every 6 (six) hours as needed for moderate pain.     KLOR-CON M20 20 MEQ tablet Take 20 mEq by mouth 2 (two) times daily.   1   L-Methylfolate-B12-B6-B2 (CEREFOLIN) 07-29-48-5 MG TABS Take 1 tablet by mouth every morning. 90 tablet 3   levothyroxine (SYNTHROID) 175 MCG tablet Take 175 mcg by mouth daily before breakfast.   5   loratadine (CLARITIN) 10 MG tablet TAKE 1 TABLET BY MOUTH EVERY DAY (Patient not taking: Reported on 09/17/2019) 90 tablet 1   metoprolol (TOPROL-XL) 200 MG 24 hr tablet Take 200 mg by mouth daily.   5   pantoprazole (PROTONIX) 40 MG tablet Take 40 mg by mouth 2 (two) times daily. (Patient not taking: Reported on 09/17/2019)     No current facility-administered medications for this visit.    OBJECTIVE: African-American woman who appears stated age  72:   10/24/19 1330  BP: (!) 148/57  Pulse: 65  Resp: 18  Temp: (!) 97.3 F (36.3 C)  SpO2: 99%     Body mass index is 53.31 kg/m.   Wt Readings from Last 3 Encounters:  10/24/19 (!) 310 lb 9.6 oz (140.9 kg)  08/06/19 (!) 306 lb 4 oz (138.9 kg)  08/01/19 (!) 306 lb 9.6 oz (139.1 kg)      ECOG FS:1 - Symptomatic but completely ambulatory  Sclerae unicteric, EOMs intact Wearing a mask No cervical or supraclavicular adenopathy Lungs no rales or rhonchi Heart regular rate and rhythm Abd soft, nontender, positive bowel sounds MSK no focal spinal tenderness, no upper extremity lymphedema Neuro: nonfocal, well oriented, appropriate affect Breasts: The right breast is benign per the left breast is status post lumpectomy and radiation.  There is mild hyperpigmentation and mild coarsening of the skin as expected.  There is no evidence of local recurrence.  Both axillae are benign.   LAB RESULTS:  CMP     Component Value Date/Time   NA 142 08/01/2019 1320    K 3.4 (L) 08/01/2019 1320   CL 101 08/01/2019 1320   CO2 31 08/01/2019 1320   GLUCOSE 115 (H) 08/01/2019 1320   BUN 12 08/01/2019 1320   CREATININE 0.90 08/01/2019 1320   CREATININE 0.83 04/10/2019 1240   CALCIUM 9.2 08/01/2019 1320   PROT 6.3 (L) 08/01/2019 1320   ALBUMIN 3.4 (L) 08/01/2019  1320   AST 19 08/01/2019 1320   AST 17 04/10/2019 1240   ALT 24 08/01/2019 1320   ALT 19 04/10/2019 1240   ALKPHOS 81 08/01/2019 1320   BILITOT 0.6 08/01/2019 1320   BILITOT 0.4 04/10/2019 1240   GFRNONAA >60 08/01/2019 1320   GFRNONAA >60 04/10/2019 1240   GFRAA >60 08/01/2019 1320   GFRAA >60 04/10/2019 1240    No results found for: TOTALPROTELP, ALBUMINELP, A1GS, A2GS, BETS, BETA2SER, GAMS, MSPIKE, SPEI  Lab Results  Component Value Date   WBC 4.2 10/24/2019   NEUTROABS 2.3 10/24/2019   HGB 11.0 (L) 10/24/2019   HCT 33.8 (L) 10/24/2019   MCV 83.9 10/24/2019   PLT 184 10/24/2019    No results found for: LABCA2  No components found for: GURKYH062  No results for input(s): INR in the last 168 hours.  No results found for: LABCA2  No results found for: BJS283  No results found for: TDV761  No results found for: YWV371  No results found for: CA2729  No components found for: HGQUANT  No results found for: CEA1 / No results found for: CEA1   No results found for: AFPTUMOR  No results found for: CHROMOGRNA  No results found for: KPAFRELGTCHN, LAMBDASER, KAPLAMBRATIO (kappa/lambda light chains)  No results found for: HGBA, HGBA2QUANT, HGBFQUANT, HGBSQUAN (Hemoglobinopathy evaluation)   No results found for: LDH  No results found for: IRON, TIBC, IRONPCTSAT (Iron and TIBC)  No results found for: FERRITIN  Urinalysis No results found for: COLORURINE, APPEARANCEUR, LABSPEC, PHURINE, GLUCOSEU, HGBUR, BILIRUBINUR, KETONESUR, PROTEINUR, UROBILINOGEN, NITRITE, LEUKOCYTESUR   STUDIES: No results found.   ELIGIBLE FOR AVAILABLE RESEARCH PROTOCOL:BR003?--Depending  on final surgical pathology  ASSESSMENT: 54 y.o. Nowthen woman status post left breast upper outer quadrant biopsy 04/03/2019 for a clinical T1b N0, stage IB invasive ductal carcinoma, grade 3, functionally triple negative (estrogen receptor is weakly positive at 10%, progesterone receptor negative, HER-2 not amplified), with an MIB-1 of 30%  (1) status post left lumpectomy and sentinel lymph node sampling 04/17/2019 for a pT1c pN0, stage IB invasive ductal carcinoma, grade 3, triple negative, with negative margins  (a) a total of 2 sentinel lymph nodes were removed  (2) adjuvant chemotherapy consisting of cyclophosphamide and docetaxel every 21 days x 4 started 05/23/2019, completed 07/25/2019 with no dose reductions or delays  (3) adjuvant radiation 08/13/2019 - 09/27/2019  (a) left breast / 50.4 Gy in 28 fractions  (b) seroma boost / 10 Gy in 5 fractions  (4) genetics testing 04/19/2019 through the [facility] Breast Cancer STAT Panel + Common Hereditary Cancers Panel 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 STK11, TP53, TSC1, TSC2, and VHL.  The following genes were evaluated for sequence changes only: SDHA and HOXB13 c.251G>A variant only.  (b) current data suggests an increase risk of breast and ovarian cancer in patients carrying a BARD1 mutation, but data is preliminary and insufficient for definitive recommendations  (5)  prophylactic anastrozole started 10/24/2019  (6) ntensified screening: plan breast MRI September,  mammography March on a yearly basis  (7) consider bilateral salpingo-oophorectomy prophylactically   PLAN: Vanya has completed her active treatment.  She is still recovering from the radiation and chemotherapy but is feeling better and with more energy all the time.  She understands that the symptoms she has including the local symptoms in the left breast do not indicate residual cancer but simply residual from the treatment itself.  She is benefiting from physical therapy and will have a compression sleeve as well as a compression bra.  Looking ahead she is at risk of developing other breast cancers in the future.  I think she will benefit from anastrozole prophylactically.  We discussed the possible toxicities side effects and complications of this agent in detail today.  I went ahead and wrote the prescription for her and she will start 11/05/2019.  I will have a virtual visit with her in late October just to make sure that she is tolerating it well.  We also discussed intensified screening.  She will have a breast MRI in September and mammography in March.  I will see her in person in April or after the mammogram.  She is now ready to have her port removed and I have alerted her surgeon regarding that  Finally she is interested in bilateral salpingo-oophorectomy.  She understands we have insufficient data regarding the actual risk given her mutation but given that I have absolutely no problem with that decision and she may have the procedure performed by her gynecologist or by our own gynecology oncologist if she prefers.  I have left a message with Dr. Landry Mellow to let us know which she prefers.  Total encounter time 35 minutes.Sarajane Jews C. Senya Hinzman, MD 10/24/19 2:01 PM Medical Oncology and Hematology [facility] [place], [place] [address] Tel. [phone]     Fax. [phone]  . I, Wilburn Mylar, am acting as scribe for Dr. Sarajane Jews C. Analeigha Nauman.  I, Lurline Del MD, have reviewed the above documentation for accuracy and completeness, and I agree with the above.   *Total Encounter Time as defined by the Centers for Medicare and Medicaid Services includes, in addition to the face-to-face time of a patient visit (documented in the note above) non-face-to-face time: obtaining and reviewing outside history, ordering and reviewing medications, tests or procedures, care coordination (communications with other health care professionals or caregivers) and documentation in the medical record.

## 2019-10-24 ENCOUNTER — Other Ambulatory Visit: Payer: Self-pay

## 2019-10-24 ENCOUNTER — Inpatient Hospital Stay: Payer: BC Managed Care – PPO

## 2019-10-24 ENCOUNTER — Inpatient Hospital Stay (HOSPITAL_BASED_OUTPATIENT_CLINIC_OR_DEPARTMENT_OTHER): Payer: BC Managed Care – PPO | Admitting: Oncology

## 2019-10-24 ENCOUNTER — Inpatient Hospital Stay: Payer: BC Managed Care – PPO | Attending: Oncology

## 2019-10-24 VITALS — BP 148/57 | HR 65 | Temp 97.3°F | Resp 18 | Ht 64.0 in | Wt 310.6 lb

## 2019-10-24 DIAGNOSIS — C50412 Malignant neoplasm of upper-outer quadrant of left female breast: Secondary | ICD-10-CM | POA: Diagnosis not present

## 2019-10-24 DIAGNOSIS — Z95828 Presence of other vascular implants and grafts: Secondary | ICD-10-CM

## 2019-10-24 DIAGNOSIS — Z1239 Encounter for other screening for malignant neoplasm of breast: Secondary | ICD-10-CM

## 2019-10-24 DIAGNOSIS — Z1501 Genetic susceptibility to malignant neoplasm of breast: Secondary | ICD-10-CM

## 2019-10-24 DIAGNOSIS — Z9189 Other specified personal risk factors, not elsewhere classified: Secondary | ICD-10-CM

## 2019-10-24 DIAGNOSIS — Z90710 Acquired absence of both cervix and uterus: Secondary | ICD-10-CM | POA: Diagnosis not present

## 2019-10-24 DIAGNOSIS — Z452 Encounter for adjustment and management of vascular access device: Secondary | ICD-10-CM | POA: Diagnosis not present

## 2019-10-24 DIAGNOSIS — Z170 Estrogen receptor positive status [ER+]: Secondary | ICD-10-CM

## 2019-10-24 DIAGNOSIS — Z853 Personal history of malignant neoplasm of breast: Secondary | ICD-10-CM | POA: Diagnosis not present

## 2019-10-24 LAB — CBC WITH DIFFERENTIAL/PLATELET
Abs Immature Granulocytes: 0.01 10*3/uL (ref 0.00–0.07)
Basophils Absolute: 0 10*3/uL (ref 0.0–0.1)
Basophils Relative: 1 %
Eosinophils Absolute: 0.2 10*3/uL (ref 0.0–0.5)
Eosinophils Relative: 5 %
HCT: 33.8 % — ABNORMAL LOW (ref 36.0–46.0)
Hemoglobin: 11 g/dL — ABNORMAL LOW (ref 12.0–15.0)
Immature Granulocytes: 0 %
Lymphocytes Relative: 28 %
Lymphs Abs: 1.2 10*3/uL (ref 0.7–4.0)
MCH: 27.3 pg (ref 26.0–34.0)
MCHC: 32.5 g/dL (ref 30.0–36.0)
MCV: 83.9 fL (ref 80.0–100.0)
Monocytes Absolute: 0.5 10*3/uL (ref 0.1–1.0)
Monocytes Relative: 11 %
Neutro Abs: 2.3 10*3/uL (ref 1.7–7.7)
Neutrophils Relative %: 55 %
Platelets: 184 10*3/uL (ref 150–400)
RBC: 4.03 MIL/uL (ref 3.87–5.11)
RDW: 13.4 % (ref 11.5–15.5)
WBC: 4.2 10*3/uL (ref 4.0–10.5)
nRBC: 0 % (ref 0.0–0.2)

## 2019-10-24 LAB — COMPREHENSIVE METABOLIC PANEL
ALT: 9 U/L (ref 0–44)
AST: 14 U/L — ABNORMAL LOW (ref 15–41)
Albumin: 3.6 g/dL (ref 3.5–5.0)
Alkaline Phosphatase: 62 U/L (ref 38–126)
Anion gap: 7 (ref 5–15)
BUN: 13 mg/dL (ref 6–20)
CO2: 31 mmol/L (ref 22–32)
Calcium: 9.8 mg/dL (ref 8.9–10.3)
Chloride: 104 mmol/L (ref 98–111)
Creatinine, Ser: 0.76 mg/dL (ref 0.44–1.00)
GFR calc Af Amer: >60 mL/min (ref >60)
GFR calc non Af Amer: >60 mL/min (ref >60)
Glucose, Bld: 86 mg/dL (ref 70–99)
Potassium: 3.4 mmol/L — ABNORMAL LOW (ref 3.5–5.1)
Sodium: 142 mmol/L (ref 135–145)
Total Bilirubin: 0.4 mg/dL (ref 0.3–1.2)
Total Protein: 6.9 g/dL (ref 6.5–8.1)

## 2019-10-24 MED ORDER — HEPARIN SOD (PORK) LOCK FLUSH 100 UNIT/ML IV SOLN
500.0000 [IU] | Freq: Once | INTRAVENOUS | Status: AC | PRN
  Administered 2019-10-24: 500 [IU]
  Filled 2019-10-24: qty 5

## 2019-10-24 MED ORDER — SODIUM CHLORIDE 0.9% FLUSH
10.0000 mL | INTRAVENOUS | Status: DC | PRN
  Administered 2019-10-24: 10 mL
  Filled 2019-10-24: qty 10

## 2019-10-24 MED ORDER — ANASTROZOLE 1 MG PO TABS: 1.0000 mg | ORAL_TABLET | Freq: Every day | ORAL | 4 refills | Status: DC

## 2019-10-25 ENCOUNTER — Telehealth: Payer: Self-pay | Admitting: Oncology

## 2019-10-25 NOTE — Telephone Encounter (Signed)
Scheduled appts per 8/26 los. Left voicemail with appt dates and times.

## 2019-10-28 ENCOUNTER — Ambulatory Visit: Payer: BC Managed Care – PPO

## 2019-10-28 ENCOUNTER — Other Ambulatory Visit: Payer: Self-pay

## 2019-10-28 ENCOUNTER — Ambulatory Visit: Payer: Self-pay | Admitting: Surgery

## 2019-10-28 DIAGNOSIS — I890 Lymphedema, not elsewhere classified: Secondary | ICD-10-CM

## 2019-10-28 DIAGNOSIS — L599 Disorder of the skin and subcutaneous tissue related to radiation, unspecified: Secondary | ICD-10-CM

## 2019-10-28 DIAGNOSIS — R293 Abnormal posture: Secondary | ICD-10-CM | POA: Diagnosis not present

## 2019-10-28 NOTE — Therapy (Signed)
[place]oret[place], [place], [address] Phone: [phone]   Fax:  [phone]  Physical Therapy Treatment  Patient Details  Name: Loretta Nelson MRN: [id number] Date of Birth: [birth year] Referring Provider (PT): Shona Simpson,  Benjiman Core    Encounter Date: 10/28/2019   PT End of Session - 10/28/19 1718    Visit Number 10    Number of Visits 18    Date for PT Re-Evaluation 11/18/19    PT Start Time 9826    PT Stop Time 1705    PT Time Calculation (min) 55 min    Activity Tolerance Patient tolerated treatment well    Behavior During Therapy [facility] for tasks assessed/performed           Past Medical History:  Diagnosis Date  . Allergic rhinitis   . Anemia    low iron  . Anemia   . Anxiety   . Anxiety   . Arthritis    right knee, surgery done  . Cancer ([place]) 2021   left breast   . Complication of anesthesia    woke up during EGD  . Esophageal stricture   . Family history of breast cancer   . GERD (gastroesophageal reflux disease)   . Graves disease 01/13/11   radioactive iodine treatment, 41.5 millicuries  . Headache    tension headaches  . Hiatal hernia    small  . Hypertension   . Hyperthyroidism   . Hypothyroidism    s/p treatment  . Murmur    benign, h/o, caused by hyperdynamic contraction  . Obesity   . Pneumonia 2017   inhaler prescribed  . Sleep apnea 2021   no CPAP as yet, last study done lastnight 04/15/2019  . Tendonitis of shoulder, right   . Vitamin D deficiency   . Vitamin D deficiency     Past Surgical History:  Procedure Laterality Date  . BALLOON DILATION N/A 09/07/2016   Procedure: BALLOON DILATION;  Surgeon: Arta Silence, MD;  Location: [facility] ENDOSCOPY;  Service: Endoscopy;  Laterality: N/A;  . BREAST LUMPECTOMY WITH RADIOACTIVE SEED AND SENTINEL LYMPH NODE BIOPSY Left 04/17/2019   Procedure: LEFT BREAST LUMPECTOMY WITH RADIOACTIVE SEED AND SENTINEL LYMPH NODE  BIOPSY;  Surgeon: Donnie Mesa, MD;  Location: [facility];  Service: General;  Laterality: Left;  LMA, PEC BLOCK  . CESAREAN SECTION  1999  . COLONOSCOPY    . ESOPHAGOGASTRODUODENOSCOPY    . ESOPHAGOGASTRODUODENOSCOPY (EGD) WITH PROPOFOL N/A 09/07/2016   Procedure: ESOPHAGOGASTRODUODENOSCOPY (EGD) WITH PROPOFOL;  Surgeon: Arta Silence, MD;  Location: [place];  Service: Endoscopy;  Laterality: N/A;  . HYSTERECTOMY ABDOMINAL WITH SALPINGECTOMY Bilateral 02/17/2017   Procedure: HYSTERECTOMY ABDOMINAL WITH SALPINGECTOMY;  Surgeon: Christophe Louis, MD;  Location: [facility];  Service: Gynecology;  Laterality: Bilateral;  . KNEE ARTHROSCOPY     for torn meniscus  . PORTACATH PLACEMENT Right 05/22/2019   Procedure: INSERTION PORT-A-CATH WITH ULTRASOUND GUIDANCE;  Surgeon: Donnie Mesa, MD;  Location: [facility];  Service: General;  Laterality: Right;  Endotrachial tube  . WISDOM TOOTH EXTRACTION    . WISDOM TOOTH EXTRACTION      There were no vitals filed for this visit.   Subjective Assessment - 10/28/19 1615    Subjective I got my compression bra and been wearing it every day since Friday. It's comfortable, it just going to take some getting used to. I've been back to work now for 3 weeks and I'm just starting to notice in past  week or so that my back hasn't been bothering me anymore, I think I'm finally getting used to all the walking.    Pertinent History Patient was diagnosed 03/19/2019 with left grade III invasive ductal carcinoma breast cancer. Patient reports she underwent a left lumpectomy and sentinel node biopsy on 04/17/2019 with 2 negative nodes removed. It is triple negative with a Ki67 of 30%. Chemotherapy and radiation, past history includes right knee pain    Patient Stated Goals Recheck and see if my arm is ok    Currently in Pain? No/denies                             [facility] Adult PT Treatment/Exercise - 10/28/19 0001      Manual Therapy   Manual Lymphatic Drainage  (MLD) in supine with head of bed elevated: short neck, superficial and deep abdominals, right axillary nodes, right intact upper quadrant sequence, and establishment of anterior interaxillary pathway, left inguinal nodes and establishment of axillo inguinal pathway, R breast moving fluid towards pathways then to L sidelying to work on L lateral breast and posterior interaxillary pathway then back to supine to complete pathways                    PT Short Term Goals - 10/21/19 1611      PT SHORT TERM GOAL #1   Title Pt will report the pain in her right hip has decreased to 7/10 at the most    Baseline 10/10 at times on 09/17/2019, 10/21/19- 5/10    Time 4    Period Weeks    Status Achieved      PT SHORT TERM GOAL #2   Title Pt will be independent in a home exericse pain for lumbar mobiity and core strengthening    Time 4    Period Weeks    Status On-going             PT Long Term Goals - 10/21/19 1612      PT LONG TERM GOAL #1   Title Pt will be independent in managment of left breast lymphedem post radiation    Baseline 10/21/19- pt getting compression bra on Friday    Time 8    Period Weeks    Status On-going      PT LONG TERM GOAL #2   Title Pt will report the pain and dicomfort in her left upper quadrant is decreased by 50%    Baseline 10/21/19- pt reports that the pain is sitll intermittent    Time 8    Period Weeks    Status On-going      PT LONG TERM GOAL #3   Title Patient will demonstrate and verbalize proper sitting posture for decreased strain on neck and scapula while working on her computer.    Time 8    Period Weeks    Status On-going      PT LONG TERM GOAL #4   Title Patient will improve her DASH score to be </= 8 for increased function of right upper extremity.    Time 8    Period Weeks    Status On-going      PT LONG TERM GOAL #5   Title Patient will demonstrate she has regained left shoulder ROM and function post operatively compared to  baselines.    Time 8    Period Weeks    Status Achieved  Plan - 10/28/19 1719    Clinical Impression Statement Continued manual lymph drainage of Lt breast. Softening by end of session noted at medial aspect where pt reports fullness is the worse. She also reports she has noticed good reduction recently of breast lymphedema, she has also been wearing her compression bra daily since Friday and reports noticing this to be helpful as well.    Personal Factors and Comorbidities Comorbidity 3+    Comorbidities breast cancer,sugery,  chemotherapy, radiation, back and hip pain, right knee pain    Stability/Clinical Decision Making Evolving/Moderate complexity    Rehab Potential Excellent    PT Frequency 2x / week    PT Duration 8 weeks    PT Treatment/Interventions ADLs/Self Care Home Management;Therapeutic exercise;Manual techniques;Patient/family education;Joint Manipulations;Passive range of motion;Therapeutic activities;Manual lymph drainage;Moist Heat;Orthotic Fit/Training;Compression bandaging;Taping    PT Next Visit Plan teach self MLD and issue handout, MLD as indicated, help pt get a compression sleeve    PT Home Exercise Plan lumbar mobility exercise, hip flexibility; core stabs; wear compression bra daily    Consulted and Agree with Plan of Care Patient           Patient will benefit from skilled therapeutic intervention in order to improve the following deficits and impairments:  Postural dysfunction, Decreased range of motion, Pain, Impaired UE functional use, Decreased knowledge of precautions, Increased edema, Obesity, Impaired flexibility  Visit Diagnosis: Lymphedema, not elsewhere classified  Abnormal posture  Disorder of the skin and subcutaneous tissue related to radiation, unspecified     Problem List Patient Active Problem List   Diagnosis Date Noted  . Breast cancer screening, high risk patient 10/24/2019  . At high risk for breast cancer  10/24/2019  . Port-A-Cath in place 08/01/2019  . Morbid obesity with body mass index of 50 or higher ([place]) 06/20/2019  . Genetic testing 04/19/2019  . 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 Family history of breast cancer   . Malignant neoplasm of upper-outer quadrant of left breast in female, estrogen receptor positive ([place]) 04/05/2019  . Flat foot 03/12/2018  . 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 Fibroids, intramural 02/17/2017  . Menorrhagia 02/17/2017  . S/P hysterectomy 02/17/2017    Otelia Limes, PTA 10/28/2019, 5:25 PM  [place], [place], [address] Phone: [phone]   Fax:  [phone]  Name: Nuriyah Hanline MRN: [id number] Date of Birth: 23-Feb-1966

## 2019-10-30 ENCOUNTER — Encounter: Payer: BC Managed Care – PPO | Admitting: Physical Therapy

## 2019-11-05 ENCOUNTER — Encounter: Payer: BC Managed Care – PPO | Admitting: Physical Therapy

## 2019-11-07 ENCOUNTER — Other Ambulatory Visit: Payer: Self-pay

## 2019-11-07 ENCOUNTER — Encounter: Payer: Self-pay | Admitting: Rehabilitation

## 2019-11-07 ENCOUNTER — Ambulatory Visit: Payer: BC Managed Care – PPO | Attending: Oncology | Admitting: Rehabilitation

## 2019-11-07 DIAGNOSIS — L599 Disorder of the skin and subcutaneous tissue related to radiation, unspecified: Secondary | ICD-10-CM | POA: Diagnosis present

## 2019-11-07 DIAGNOSIS — M25511 Pain in right shoulder: Secondary | ICD-10-CM | POA: Diagnosis present

## 2019-11-07 DIAGNOSIS — R293 Abnormal posture: Secondary | ICD-10-CM | POA: Diagnosis present

## 2019-11-07 DIAGNOSIS — C50412 Malignant neoplasm of upper-outer quadrant of left female breast: Secondary | ICD-10-CM | POA: Insufficient documentation

## 2019-11-07 DIAGNOSIS — R600 Localized edema: Secondary | ICD-10-CM | POA: Insufficient documentation

## 2019-11-07 DIAGNOSIS — I890 Lymphedema, not elsewhere classified: Secondary | ICD-10-CM | POA: Insufficient documentation

## 2019-11-07 DIAGNOSIS — Z483 Aftercare following surgery for neoplasm: Secondary | ICD-10-CM | POA: Diagnosis present

## 2019-11-07 DIAGNOSIS — M25551 Pain in right hip: Secondary | ICD-10-CM | POA: Insufficient documentation

## 2019-11-07 DIAGNOSIS — G8929 Other chronic pain: Secondary | ICD-10-CM | POA: Diagnosis present

## 2019-11-07 DIAGNOSIS — M25611 Stiffness of right shoulder, not elsewhere classified: Secondary | ICD-10-CM | POA: Diagnosis present

## 2019-11-07 DIAGNOSIS — Z170 Estrogen receptor positive status [ER+]: Secondary | ICD-10-CM | POA: Insufficient documentation

## 2019-11-07 NOTE — Therapy (Signed)
[place], [place], [address] Phone: [phone]   Fax:  [phone]  Physical Therapy Treatment  Patient Details  Name: Loretta Nelson MRN: [id number] Date of Birth: [birth year] Referring Provider (PT): Shona Simpson,  Benjiman Core    Encounter Date: 11/07/2019   PT End of Session - 11/07/19 1702    Visit Number 11    Number of Visits 18    Date for PT Re-Evaluation 11/18/19    PT Start Time 1194    PT Stop Time 1655    PT Time Calculation (min) 42 min    Activity Tolerance Patient tolerated treatment well    Behavior During Therapy [facility] for tasks assessed/performed           Past Medical History:  Diagnosis Date  . Allergic rhinitis   . Anemia    low iron  . Anemia   . Anxiety   . Anxiety   . Arthritis    right knee, surgery done  . Cancer ([place]) 2021   left breast   . Complication of anesthesia    woke up during EGD  . Esophageal stricture   . Family history of breast cancer   . GERD (gastroesophageal reflux disease)   . Graves disease 01/13/11   radioactive iodine treatment, 17.4 millicuries  . Headache    tension headaches  . Hiatal hernia    small  . Hypertension   . Hyperthyroidism   . Hypothyroidism    s/p treatment  . Murmur    benign, h/o, caused by hyperdynamic contraction  . Obesity   . Pneumonia 2017   inhaler prescribed  . Sleep apnea 2021   no CPAP as yet, last study done lastnight 04/15/2019  . Tendonitis of shoulder, right   . Vitamin D deficiency   . Vitamin D deficiency     Past Surgical History:  Procedure Laterality Date  . BALLOON DILATION N/A 09/07/2016   Procedure: BALLOON DILATION;  Surgeon: Arta Silence, MD;  Location: [facility] ENDOSCOPY;  Service: Endoscopy;  Laterality: N/A;  . BREAST LUMPECTOMY WITH RADIOACTIVE SEED AND SENTINEL LYMPH NODE BIOPSY Left 04/17/2019   Procedure: LEFT BREAST LUMPECTOMY WITH RADIOACTIVE SEED AND SENTINEL LYMPH NODE  BIOPSY;  Surgeon: Donnie Mesa, MD;  Location: [place];  Service: General;  Laterality: Left;  LMA, PEC BLOCK  . CESAREAN SECTION  1999  . COLONOSCOPY    . ESOPHAGOGASTRODUODENOSCOPY    . ESOPHAGOGASTRODUODENOSCOPY (EGD) WITH PROPOFOL N/A 09/07/2016   Procedure: ESOPHAGOGASTRODUODENOSCOPY (EGD) WITH PROPOFOL;  Surgeon: Arta Silence, MD;  Location: [place];  Service: Endoscopy;  Laterality: N/A;  . HYSTERECTOMY ABDOMINAL WITH SALPINGECTOMY Bilateral 02/17/2017   Procedure: HYSTERECTOMY ABDOMINAL WITH SALPINGECTOMY;  Surgeon: Christophe Louis, MD;  Location: [facility];  Service: Gynecology;  Laterality: Bilateral;  . KNEE ARTHROSCOPY     for torn meniscus  . PORTACATH PLACEMENT Right 05/22/2019   Procedure: INSERTION PORT-A-CATH WITH ULTRASOUND GUIDANCE;  Surgeon: Donnie Mesa, MD;  Location: [facility];  Service: General;  Laterality: Right;  Endotrachial tube  . WISDOM TOOTH EXTRACTION    . WISDOM TOOTH EXTRACTION      There were no vitals filed for this visit.   Subjective Assessment - 11/07/19 1608    Subjective I can tell a difference not wearing the bra.    Pertinent History Patient was diagnosed 03/19/2019 with left grade III invasive ductal carcinoma breast cancer. Patient reports she underwent a left lumpectomy and sentinel node biopsy on 04/17/2019 with  2 negative nodes removed. It is triple negative with a Ki67 of 30%. Chemotherapy and radiation, past history includes right knee pain    Currently in Pain? No/denies                             [facility] Adult PT Treatment/Exercise - 11/07/19 0001      Manual Therapy   Manual Lymphatic Drainage (MLD) focused on teaching Lt breast MLD with handout given per instruction section; in supine with head of bed elevated: short neck, bil axillary nodes, establishment of anterior interaxillary pathway, left inguinal nodes and establishment of axillo inguinal pathway, Lt breast moving fluid towards pathways with vcs/tcs and hand over  hand instruction.  Pt needing to lift breast for inferior/lateral work or perform seated                   PT Education - 11/07/19 1702    Education Details self MLD    Person(s) Educated Patient    Methods Explanation;Demonstration;Verbal cues;Tactile cues;Handout    Comprehension Verbalized understanding;Returned demonstration;Verbal cues required;Tactile cues required            PT Short Term Goals - 10/21/19 1611      PT SHORT TERM GOAL #1   Title Pt will report the pain in her right hip has decreased to 7/10 at the most    Baseline 10/10 at times on 09/17/2019, 10/21/19- 5/10    Time 4    Period Weeks    Status Achieved      PT SHORT TERM GOAL #2   Title Pt will be independent in a home exericse pain for lumbar mobiity and core strengthening    Time 4    Period Weeks    Status On-going             PT Long Term Goals - 11/07/19 1705      PT LONG TERM GOAL #1   Title Pt will be independent in managment of left breast lymphedem post radiation    Baseline 10/21/19- pt getting compression bra on Friday, educated on self MLD 11/07/19    Status Partially Met      PT LONG TERM GOAL #2   Title Pt will report the pain and dicomfort in her left upper quadrant is decreased by 50%    Status On-going      PT LONG TERM GOAL #3   Title Patient will demonstrate and verbalize proper sitting posture for decreased strain on neck and scapula while working on her computer.    Status On-going      PT LONG TERM GOAL #4   Title Patient will improve her DASH score to be </= 8 for increased function of right upper extremity.    Status On-going      PT LONG TERM GOAL #5   Title Patient will demonstrate she has regained left shoulder ROM and function post operatively compared to baselines.    Status On-going                 Plan - 11/07/19 1703    Clinical Impression Statement Continued MLD of Lt breast with focus on self MLD instruction.  Pt has some difficulty reaching  the left side but improved in seated or using both hands.  Pt happy with continued progress    PT Frequency 2x / week    PT Duration 8 weeks    PT Treatment/Interventions ADLs/Self  Care Home Management;Therapeutic exercise;Manual techniques;Patient/family education;Joint Manipulations;Passive range of motion;Therapeutic activities;Manual lymph drainage;Moist Heat;Orthotic Fit/Training;Compression bandaging;Taping    PT Next Visit Plan MLD to left breast ,  help pt get a compression sleeve    Consulted and Agree with Plan of Care Patient           Patient will benefit from skilled therapeutic intervention in order to improve the following deficits and impairments:     Visit Diagnosis: Lymphedema, not elsewhere classified  Abnormal posture  Disorder of the skin and subcutaneous tissue related to radiation, unspecified  Aftercare following surgery for neoplasm  Malignant neoplasm of upper-outer quadrant of left breast in female, estrogen receptor positive ([facility])  Chronic right shoulder pain  Stiffness of right shoulder, not elsewhere classified  Localized edema  Pain in right hip     Problem List Patient Active Problem List   Diagnosis Date Noted  . Breast cancer screening, high risk patient 10/24/2019  . At high risk for breast cancer 10/24/2019  . Port-A-Cath in place 08/01/2019  . Morbid obesity with body mass index of 50 or higher ([place]) 06/20/2019  . Genetic testing 04/19/2019  . 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 Family history of breast cancer   . Malignant neoplasm of upper-outer quadrant of left breast in female, estrogen receptor positive ([place]) 04/05/2019  . Flat foot 03/12/2018  . 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 Fibroids, intramural 02/17/2017  . Menorrhagia 02/17/2017  . S/P hysterectomy 02/17/2017    Stark Bray 11/07/2019, 5:06 PM  [place], [place], [address] Phone: [phone]   Fax:  [phone]  Name: Loretta Nelson MRN: [id number] Date of Birth: [birth year]

## 2019-11-07 NOTE — Patient Instructions (Signed)

## 2019-11-12 ENCOUNTER — Encounter: Payer: Self-pay | Admitting: Physical Therapy

## 2019-11-12 ENCOUNTER — Ambulatory Visit: Payer: BC Managed Care – PPO | Admitting: Physical Therapy

## 2019-11-12 ENCOUNTER — Other Ambulatory Visit: Payer: Self-pay

## 2019-11-12 DIAGNOSIS — R293 Abnormal posture: Secondary | ICD-10-CM

## 2019-11-12 DIAGNOSIS — I890 Lymphedema, not elsewhere classified: Secondary | ICD-10-CM

## 2019-11-12 DIAGNOSIS — L599 Disorder of the skin and subcutaneous tissue related to radiation, unspecified: Secondary | ICD-10-CM

## 2019-11-12 DIAGNOSIS — M25551 Pain in right hip: Secondary | ICD-10-CM

## 2019-11-12 DIAGNOSIS — Z483 Aftercare following surgery for neoplasm: Secondary | ICD-10-CM

## 2019-11-12 NOTE — Therapy (Signed)
[place], [place], [address] Phone: [phone]   Fax:  [phone]  Physical Therapy Treatment  Patient Details  Name: Loretta Nelson MRN: [id number] Date of Birth: 11-Feb-1966 Referring Provider (PT): Shona Simpson,  Benjiman Core    Encounter Date: 11/12/2019   PT End of Session - 11/12/19 7035    Visit Number 12    Number of Visits 26    Date for PT Re-Evaluation 01/13/20    PT Start Time 0093    PT Stop Time 1655    PT Time Calculation (min) 50 min    Activity Tolerance Patient tolerated treatment well    Behavior During Therapy [facility] for tasks assessed/performed           Past Medical History:  Diagnosis Date  . Allergic rhinitis   . Anemia    low iron  . Anemia   . Anxiety   . Anxiety   . Arthritis    right knee, surgery done  . Cancer ([place]) 2021   left breast   . Complication of anesthesia    woke up during EGD  . Esophageal stricture   . Family history of breast cancer   . GERD (gastroesophageal reflux disease)   . Graves disease 01/13/11   radioactive iodine treatment, 81.8 millicuries  . Headache    tension headaches  . Hiatal hernia    small  . Hypertension   . Hyperthyroidism   . Hypothyroidism    s/p treatment  . Murmur    benign, h/o, caused by hyperdynamic contraction  . Obesity   . Pneumonia 2017   inhaler prescribed  . Sleep apnea 2021   no CPAP as yet, last study done lastnight 04/15/2019  . Tendonitis of shoulder, right   . Vitamin D deficiency   . Vitamin D deficiency     Past Surgical History:  Procedure Laterality Date  . BALLOON DILATION N/A 09/07/2016   Procedure: BALLOON DILATION;  Surgeon: Arta Silence, MD;  Location: [facility] ENDOSCOPY;  Service: Endoscopy;  Laterality: N/A;  . BREAST LUMPECTOMY WITH RADIOACTIVE SEED AND SENTINEL LYMPH NODE BIOPSY Left 04/17/2019   Procedure: LEFT BREAST LUMPECTOMY WITH RADIOACTIVE SEED AND SENTINEL LYMPH NODE  BIOPSY;  Surgeon: Donnie Mesa, MD;  Location: [facility];  Service: General;  Laterality: Left;  LMA, PEC BLOCK  . CESAREAN SECTION  1999  . COLONOSCOPY    . ESOPHAGOGASTRODUODENOSCOPY    . ESOPHAGOGASTRODUODENOSCOPY (EGD) WITH PROPOFOL N/A 09/07/2016   Procedure: ESOPHAGOGASTRODUODENOSCOPY (EGD) WITH PROPOFOL;  Surgeon: Arta Silence, MD;  Location: [facility];  Service: Endoscopy;  Laterality: N/A;  . HYSTERECTOMY ABDOMINAL WITH SALPINGECTOMY Bilateral 02/17/2017   Procedure: HYSTERECTOMY ABDOMINAL WITH SALPINGECTOMY;  Surgeon: Christophe Louis, MD;  Location: [facility];  Service: Gynecology;  Laterality: Bilateral;  . KNEE ARTHROSCOPY     for torn meniscus  . PORTACATH PLACEMENT Right 05/22/2019   Procedure: INSERTION PORT-A-CATH WITH ULTRASOUND GUIDANCE;  Surgeon: Donnie Mesa, MD;  Location: [facility];  Service: General;  Laterality: Right;  Endotrachial tube  . WISDOM TOOTH EXTRACTION    . WISDOM TOOTH EXTRACTION      There were no vitals filed for this visit.   Subjective Assessment - 11/12/19 1746    Subjective Pt states that her symptoms are worse in the morning. She has to sleep on her left side because she has pain in her right hip if she lies on it.  She feels that her left breast 'fills up' overnight.  She gets some relief from wearing the compression bra.    Pertinent History Patient was diagnosed 03/19/2019 with left grade III invasive ductal carcinoma breast cancer. Patient reports she underwent a left lumpectomy and sentinel node biopsy on 04/17/2019 with 2 negative nodes removed. It is triple negative with a Ki67 of 30%. Chemotherapy and radiation, past history includes right knee pain    Patient Stated Goals Recheck and see if my arm is ok              [facility] PT Assessment - 11/12/19 0001      Assessment   Medical Diagnosis s/p left lumpectomy     Referring Provider (PT) Shona Simpson,  Benjiman Core       Prior Function   Level of Independence Independent                          [facility] Adult PT Treatment/Exercise - 11/12/19 0001      Manual Therapy   Manual Therapy Edema management;Manual Lymphatic Drainage (MLD);Passive ROM    Manual therapy comments pt states she knows how to do self MLD after last session instruction     Edema Management talked to patient about Flexitouch and gave her a handout.  she wants to see what benefits will be so demographics sent to [employer]     Manual Lymphatic Drainage (MLD) short neck on left side ( avoided port) superficial and deep abdoinals, left upper chest, breast ( being careful on radiated skin) and adominals, left axilla arm to hand and retrun along pathways, the to right sidelying for posterior interaxillary anastamosis and more work on lateral chest.                     PT Short Term Goals - 11/12/19 1710      PT SHORT TERM GOAL #1   Title Pt will report the pain in her right hip has decreased to 7/10 at the most    Baseline 10/10 at times on 09/17/2019, 10/21/19- 5/10    Status Achieved      PT SHORT TERM GOAL #2   Title Pt will be independent in a home exericse pain for lumbar mobiity and core strengthening    Status Achieved             PT Long Term Goals - 11/12/19 1655      PT LONG TERM GOAL #1   Title Pt will be independent in managment of left breast lymphedem post radiation    Baseline 10/21/19- pt getting compression bra on Friday, educated on self MLD 11/07/19    Time 8    Status On-going      PT LONG TERM GOAL #2   Title Pt will report the pain and dicomfort in her left upper quadrant is decreased by 50%    Baseline 10/21/19- pt reports that the pain is sitll intermittent, Pt still has swelling but pain is decreased    Status Achieved      PT LONG TERM GOAL #3   Title Patient will demonstrate and verbalize proper sitting posture for decreased strain on neck and scapula while working on her computer.    Time 8    Period Weeks    Status On-going      PT LONG TERM  GOAL #4   Title Patient will improve her DASH score to be </= 8 for increased function of right upper extremity.  Time 8    Period Weeks    Status On-going      PT LONG TERM GOAL #5   Title Patient will demonstrate she has regained left shoulder ROM and function post operatively compared to baselines.    Time 8    Period Weeks    Status On-going                 Plan - 11/12/19 1754    Clinical Impression Statement Pt continues with fullness and enlarged pore size of left breast but it is much softer with only mild congestion felt at lower inner quadrant. She would benefit from Flexitouch intermittent compression pump as symptoms have persisted for > 6 weeks even with MLD and use of compression. Expect she will require long term management of symptoms at home Pt is receiving benefit from treatment but has difficutly getting here because of work schedule and distance from her work to our clinic. Renewal sent for 1 time a week for 8 weeks to continue with symptoma managment, monitor how she is managing at home and help with getting Flexitouch and sleeve as needed.    Personal Factors and Comorbidities Comorbidity 3+    Comorbidities breast cancer,sugery,  chemotherapy, radiation, back and hip pain, right knee pain    Stability/Clinical Decision Making Evolving/Moderate complexity    Clinical Decision Making Moderate    Rehab Potential Excellent    PT Frequency 1x / week    PT Duration 8 weeks    PT Next Visit Plan do SOZO screen after Sept 27. MLD to left breast ,  help pt get a compression sleeve and Flexitouch, add pulleys and UE stretches as needed    PT Home Exercise Plan lumbar mobility exercise, hip flexibility; core stabs; wear compression bra daily    Recommended Other Services 11/12/2019: demographics sent to [employer]    Consulted and Agree with Plan of Care Patient           Patient will benefit from skilled therapeutic intervention in order to improve the following  deficits and impairments:  Postural dysfunction, Decreased range of motion, Pain, Impaired UE functional use, Decreased knowledge of precautions, Increased edema, Obesity, Impaired flexibility  Visit Diagnosis: Lymphedema, not elsewhere classified - Plan: PT plan of care cert/re-cert  Abnormal posture - Plan: PT plan of care cert/re-cert  Disorder of the skin and subcutaneous tissue related to radiation, unspecified - Plan: PT plan of care cert/re-cert  Aftercare following surgery for neoplasm - Plan: PT plan of care cert/re-cert  Pain in right hip - Plan: PT plan of care cert/re-cert     Problem List Patient Active Problem List   Diagnosis Date Noted  . Breast cancer screening, high risk patient 10/24/2019  . At high risk for breast cancer 10/24/2019  . Port-A-Cath in place 08/01/2019  . Morbid obesity with body mass index of 50 or higher ([place]) 06/20/2019  . Genetic testing 04/19/2019  . 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 Family history of breast cancer   . Malignant neoplasm of upper-outer quadrant of left breast in female, estrogen receptor positive ([place]) 04/05/2019  . Flat foot 03/12/2018  . 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 Fibroids, intramural 02/17/2017  . Menorrhagia 02/17/2017  . S/P hysterectomy 02/17/2017   Donato Heinz. Owens Shark PT  [place] 11/12/2019, [place], [place], [address] Phone: [phone]   Fax:  [phone]  Name: Loretta  Christabell Nelson MRN: [id number] Date of Birth: 01-23-1966

## 2019-11-13 ENCOUNTER — Ambulatory Visit
Admission: RE | Admit: 2019-11-13 | Discharge: 2019-11-13 | Disposition: A | Discharge status: Home / Self Care | Payer: BC Managed Care – PPO | Source: Ambulatory Visit | Attending: Oncology | Admitting: Oncology

## 2019-11-13 DIAGNOSIS — Z90710 Acquired absence of both cervix and uterus: Secondary | ICD-10-CM

## 2019-11-13 DIAGNOSIS — Z1239 Encounter for other screening for malignant neoplasm of breast: Secondary | ICD-10-CM

## 2019-11-13 DIAGNOSIS — C50412 Malignant neoplasm of upper-outer quadrant of left female breast: Secondary | ICD-10-CM

## 2019-11-13 DIAGNOSIS — Z170 Estrogen receptor positive status [ER+]: Secondary | ICD-10-CM

## 2019-11-13 DIAGNOSIS — Z1501 Genetic susceptibility to malignant neoplasm of breast: Secondary | ICD-10-CM

## 2019-11-13 DIAGNOSIS — Z9189 Other specified personal risk factors, not elsewhere classified: Secondary | ICD-10-CM

## 2019-11-13 MED ORDER — GADOBUTROL 1 MMOL/ML IV SOLN
10.0000 mL | Freq: Once | INTRAVENOUS | Status: AC | PRN
  Administered 2019-11-13: 10 mL via INTRAVENOUS

## 2019-11-14 ENCOUNTER — Encounter: Payer: BC Managed Care – PPO | Admitting: Physical Therapy

## 2019-11-18 ENCOUNTER — Encounter: Payer: Self-pay | Admitting: Oncology

## 2019-11-25 ENCOUNTER — Ambulatory Visit: Payer: BC Managed Care – PPO

## 2019-12-02 ENCOUNTER — Encounter (HOSPITAL_BASED_OUTPATIENT_CLINIC_OR_DEPARTMENT_OTHER): Payer: Self-pay | Admitting: Surgery

## 2019-12-02 ENCOUNTER — Ambulatory Visit: Payer: BC Managed Care – PPO

## 2019-12-02 NOTE — Progress Notes (Signed)
Chart reviewed with Dr. Sabra Heck.  Patient does not meet MCDS guidelines (BMI).  Case will be moved to [place] to call office.

## 2019-12-04 ENCOUNTER — Encounter: Payer: Self-pay | Admitting: Oncology

## 2019-12-06 ENCOUNTER — Other Ambulatory Visit (HOSPITAL_COMMUNITY): Payer: BC Managed Care – PPO

## 2019-12-09 ENCOUNTER — Other Ambulatory Visit: Payer: Self-pay

## 2019-12-09 ENCOUNTER — Encounter: Payer: Self-pay | Admitting: Physical Therapy

## 2019-12-09 ENCOUNTER — Ambulatory Visit: Payer: BC Managed Care – PPO | Attending: Oncology | Admitting: Physical Therapy

## 2019-12-09 DIAGNOSIS — I890 Lymphedema, not elsewhere classified: Secondary | ICD-10-CM | POA: Insufficient documentation

## 2019-12-09 NOTE — Therapy (Addendum)
[place], [place], [address] Phone: [phone]   Fax:  [phone]  Physical Therapy Treatment  Patient Details  Name: Loretta Nelson MRN: [id number] Date of Birth: 05-23-1965 Referring Provider (PT): Shona Simpson,  Benjiman Core    Encounter Date: 12/09/2019   PT End of Session - 12/09/19 1659    Visit Number 13    Number of Visits 26    Date for PT Re-Evaluation 01/13/20    PT Start Time 1600    PT Stop Time 1656    PT Time Calculation (min) 56 min    Activity Tolerance Patient tolerated treatment well    Behavior During Therapy [facility] for tasks assessed/performed           Past Medical History:  Diagnosis Date  . Allergic rhinitis   . Anemia    low iron  . Anemia   . Anxiety   . Anxiety   . Arthritis    right knee, surgery done  . Cancer ([place]) 2021   left breast   . Complication of anesthesia    woke up during EGD  . Esophageal stricture   . Family history of breast cancer   . GERD (gastroesophageal reflux disease)   . Graves disease 01/13/11   radioactive iodine treatment, 42.6 millicuries  . Headache    tension headaches  . Hiatal hernia    small  . Hypertension   . Hyperthyroidism   . Hypothyroidism    s/p treatment  . Murmur    benign, h/o, caused by hyperdynamic contraction  . Obesity   . Pneumonia 2017   inhaler prescribed  . Sleep apnea 2021   no CPAP as yet, last study done lastnight 04/15/2019  . Tendonitis of shoulder, right   . Vitamin D deficiency   . Vitamin D deficiency     Past Surgical History:  Procedure Laterality Date  . BALLOON DILATION N/A 09/07/2016   Procedure: BALLOON DILATION;  Surgeon: Arta Silence, MD;  Location: [facility] ENDOSCOPY;  Service: Endoscopy;  Laterality: N/A;  . BREAST LUMPECTOMY WITH RADIOACTIVE SEED AND SENTINEL LYMPH NODE BIOPSY Left 04/17/2019   Procedure: LEFT BREAST LUMPECTOMY WITH RADIOACTIVE SEED AND SENTINEL LYMPH NODE  BIOPSY;  Surgeon: Donnie Mesa, MD;  Location: [facility];  Service: General;  Laterality: Left;  LMA, PEC BLOCK  . CESAREAN SECTION  1999  . COLONOSCOPY    . ESOPHAGOGASTRODUODENOSCOPY    . ESOPHAGOGASTRODUODENOSCOPY (EGD) WITH PROPOFOL N/A 09/07/2016   Procedure: ESOPHAGOGASTRODUODENOSCOPY (EGD) WITH PROPOFOL;  Surgeon: Arta Silence, MD;  Location: [facility];  Service: Endoscopy;  Laterality: N/A;  . HYSTERECTOMY ABDOMINAL WITH SALPINGECTOMY Bilateral 02/17/2017   Procedure: HYSTERECTOMY ABDOMINAL WITH SALPINGECTOMY;  Surgeon: Christophe Louis, MD;  Location: [facility];  Service: Gynecology;  Laterality: Bilateral;  . KNEE ARTHROSCOPY     for torn meniscus  . PORTACATH PLACEMENT Right 05/22/2019   Procedure: INSERTION PORT-A-CATH WITH ULTRASOUND GUIDANCE;  Surgeon: Donnie Mesa, MD;  Location: [facility];  Service: General;  Laterality: Right;  Endotrachial tube  . WISDOM TOOTH EXTRACTION    . WISDOM TOOTH EXTRACTION      There were no vitals filed for this visit.   Subjective Assessment - 12/09/19 1612    Subjective I have been using the pump. I missed Saturday but I did it yesterday.    Pertinent History Patient was diagnosed 03/19/2019 with left grade III invasive ductal carcinoma breast cancer. Patient reports she underwent a left lumpectomy and sentinel  node biopsy on 04/17/2019 with 2 negative nodes removed. It is triple negative with a Ki67 of 30%. Chemotherapy and radiation, past history includes right knee pain    Patient Stated Goals Recheck and see if my arm is ok    Currently in Pain? No/denies    Pain Score 0-No pain                  L-DEX FLOWSHEETS - 12/09/19 1600      L-DEX LYMPHEDEMA SCREENING   Measurement Type Unilateral    L-DEX MEASUREMENT EXTREMITY Upper Extremity    POSITION  Standing    DOMINANT SIDE Right    At Risk Side Left    BASELINE SCORE (UNILATERAL) 4.3    BASELINE LEFT 4.3    LEFT SIDE SCORE 4.7    VALUE CHANGE LEFT 0.4                      [facility] Adult PT Treatment/Exercise - 12/09/19 0001      Manual Therapy   Edema Management pt received FlexiTouch pump and has been using it over the last 2 weeks    Manual Lymphatic Drainage (MLD) in supine with head of bed elevated: short neck, superficial and deep abdominals, right axillary nodes, right intact upper quadrant sequence, and establishment of anterior interaxillary pathway, left inguinal nodes and establishment of axillo inguinal pathway, R breast moving fluid towards pathways then to L sidelying to work on L lateral breast and posterior interaxillary pathway then back to supine to complete pathways   avoided port                   PT Short Term Goals - 11/12/19 1710      PT SHORT TERM GOAL #1   Title Pt will report the pain in her right hip has decreased to 7/10 at the most    Baseline 10/10 at times on 09/17/2019, 10/21/19- 5/10    Status Achieved      PT SHORT TERM GOAL #2   Title Pt will be independent in a home exericse pain for lumbar mobiity and core strengthening    Status Achieved             PT Long Term Goals - 11/12/19 1655      PT LONG TERM GOAL #1   Title Pt will be independent in managment of left breast lymphedem post radiation    Baseline 10/21/19- pt getting compression bra on Friday, educated on self MLD 11/07/19    Time 8    Status On-going      PT LONG TERM GOAL #2   Title Pt will report the pain and dicomfort in her left upper quadrant is decreased by 50%    Baseline 10/21/19- pt reports that the pain is sitll intermittent, Pt still has swelling but pain is decreased    Status Achieved      PT LONG TERM GOAL #3   Title Patient will demonstrate and verbalize proper sitting posture for decreased strain on neck and scapula while working on her computer.    Time 8    Period Weeks    Status On-going      PT LONG TERM GOAL #4   Title Patient will improve her DASH score to be </= 8 for increased function of right  upper extremity.    Time 8    Period Weeks    Status On-going      PT LONG  TERM GOAL #5   Title Patient will demonstrate she has regained left shoulder ROM and function post operatively compared to baselines.    Time 8    Period Weeks    Status On-going                 Plan - 12/09/19 1700    Clinical Impression Statement Remeasured SOZO today and pt demonstrates minimal change since three months ago which is great. Pt reports she received her FlexiTouch and has been using it daily over the past two weeks. Continued with MLD today to L breast. L breast is still very swollen with increased pore size and fibrosis at medial and inferior aspects. Educated pt to continue with FlexiTouch and wearing her compression bra.    PT Frequency 1x / week    PT Duration 8 weeks    PT Treatment/Interventions ADLs/Self Care Home Management;Therapeutic exercise;Manual techniques;Patient/family education;Joint Manipulations;Passive range of motion;Therapeutic activities;Manual lymph drainage;Moist Heat;Orthotic Fit/Training;Compression bandaging;Taping    PT Next Visit Plan Schedule next SOZO screen for mid Jan. MLD to left breast ,  help pt get a compression sleeve and Flexitouch, add pulleys and UE stretches as needed    PT Home Exercise Plan lumbar mobility exercise, hip flexibility; core stabs; wear compression bra daily    Consulted and Agree with Plan of Care Patient           Patient will benefit from skilled therapeutic intervention in order to improve the following deficits and impairments:  Postural dysfunction, Decreased range of motion, Pain, Impaired UE functional use, Decreased knowledge of precautions, Increased edema, Obesity, Impaired flexibility  Visit Diagnosis: Lymphedema, not elsewhere classified     Problem List Patient Active Problem List   Diagnosis Date Noted  . Breast cancer screening, high risk patient 10/24/2019  . At high risk for breast cancer 10/24/2019  .  Port-A-Cath in place 08/01/2019  . Morbid obesity with body mass index of 50 or higher ([facility]) 06/20/2019  . Genetic testing 04/19/2019  . 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 Family history of breast cancer   . Malignant neoplasm of upper-outer quadrant of left breast in female, estrogen receptor positive ([facility]) 04/05/2019  . Flat foot 03/12/2018  . 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 Fibroids, intramural 02/17/2017  . Menorrhagia 02/17/2017  . S/P hysterectomy 02/17/2017     Breedlove Blue 12/09/2019, 5:02 PM  [facility] [address] [place], [place], [address] Phone: [phone]   Fax:  [phone]  Name: Demonica Ann Mandato MRN: [id number] Date of Birth: 02/19/1966   Breedlove Blue, PT 12/09/19 5:02 PM  PHYSICAL THERAPY DISCHARGE SUMMARY  Visits from Start of Care: 13  Current functional level related to goals / functional outcomes: See above, goals partially met   Remaining deficits: See above   Education / Equipment: Self MLD, compression, HEP  Plan: Patient agrees to discharge.  Patient goals were partially met. Patient is being discharged due to the patient's request.  ?????     Breedlove Blue, PT 02/06/20 2:44 PM  

## 2019-12-16 ENCOUNTER — Ambulatory Visit: Payer: BC Managed Care – PPO

## 2019-12-16 ENCOUNTER — Encounter (HOSPITAL_COMMUNITY): Payer: Self-pay

## 2019-12-23 ENCOUNTER — Encounter: Payer: BC Managed Care – PPO | Admitting: Physical Therapy

## 2019-12-29 NOTE — Progress Notes (Signed)
[place]  [phone] [phone]     ID: Mckaylie Vasey Buysse DOB: 04/25/1965  MR#: [id number]  XFG#:[id number]  Patient Care Team: [name], MD as PCP - General (Family Medicine) Mauro Kaufmann, RN as Oncology Nurse Navigator Rockwell Germany, RN as Oncology Nurse Navigator Donnie Mesa, MD as Consulting Physician (General Surgery) Akiko Schexnider, Virgie Dad, MD as Consulting Physician (Oncology) Kyung Rudd, MD as Consulting Physician (Radiation Oncology) Delrae Rend, MD as Consulting Physician (Endocrinology) Star Age, MD as Consulting Physician (Neurology) Garvin Fila, MD as Consulting Physician (Neurology) Christophe Louis, MD as Consulting Physician (Obstetrics and Gynecology) Arta Silence, MD as Consulting Physician (Gastroenterology) Chauncey Cruel, MD OTHER MD:  I connected with Angelic Schnelle Mcmurtry on 12/30/19 at 12:30 PM EDT by video enabled telemedicine visit and verified that I am speaking with the correct person using two identifiers.   I discussed the limitations, risks, security and privacy concerns of performing an evaluation and management service by telemedicine and the availability of in-person appointments. I also discussed with the patient that there may be a patient responsible charge related to this service. The patient expressed understanding and agreed to proceed.   Other persons participating in the visit and their role in the encounter: None  Patient's location: Home Provider's location: [place]: triple negative breast cancer  CURRENT TREATMENT: Observation/intensified screening   INTERVAL HISTORY: Zaynah was contacted today for follow up of her triple negative breast cancer.  She started anastrozole at the last visit here.  She is tolerating this moderately well.  Hot flashes and vaginal dryness are not concerns.  She does have some joint pains including the left hand  which sometimes cramps in her knees.  Since her last visit, she underwent breast MRI on 11/13/2019 showing: breast composition B; no evidence of malignancy.  She is scheduled for port removal on 01/07/2020 and for salpingo-oophorectomy on 02/12/2020 under Dr. Landry Mellow.   REVIEW OF SYSTEMS: Kenniyah is recovering nicely from her treatment.  Her hair is back.  She is still a little bit fatigued.  A detailed review of systems today was otherwise stable   COVID 19 VACCINATION STATUS: Has received 2 Pfizer doses, most recently March 2021   HISTORY OF CURRENT ILLNESS: From the original intake note:  Clothilde Tippetts had routine screening mammography on 03/19/2019 showing a possible abnormality in the left breast. She underwent left diagnostic mammography with tomography and left breast ultrasonography at [facility] on 03/29/2019 showing: breast density category B; 8 mm mass in left breast at 12 o'clock; left axilla negative for lymphadenopathy.  Accordingly on 04/03/2019 she proceeded to biopsy of the left breast area in question. The pathology from this procedure ([id number]) showed: invasive mammary carcinoma, grade 3, e-cadherin positive. Prognostic indicators significant for: estrogen receptor, 10% positive with weak staining intensity and progesterone receptor, 0% negative. Proliferation marker Ki67 at 30%. HER2 negative by immunohistochemistry (1+).  The patient's subsequent history is as detailed below.   PAST MEDICAL HISTORY: Past Medical History:  Diagnosis Date  . Allergic rhinitis   . Anemia    low iron  . Anemia   . Anxiety   . Anxiety   . Arthritis    right knee, surgery done  . Cancer ([place]) 2021   left breast   . Complication of anesthesia    woke up during EGD  . Esophageal stricture   . Family history of breast cancer   .  GERD (gastroesophageal reflux disease)   . Graves disease 01/13/11   radioactive iodine treatment, 91.6 millicuries  . Headache    tension  headaches  . Hiatal hernia    small  . Hypertension   . Hyperthyroidism   . Hypothyroidism    s/p treatment  . Murmur    benign, h/o, caused by hyperdynamic contraction  . Obesity   . Pneumonia 2017   inhaler prescribed  . Sleep apnea 2021   no CPAP as yet, last study done lastnight 04/15/2019  . Tendonitis of shoulder, right   . Vitamin D deficiency   . Vitamin D deficiency     PAST SURGICAL HISTORY: Past Surgical History:  Procedure Laterality Date  . BALLOON DILATION N/A 09/07/2016   Procedure: BALLOON DILATION;  Surgeon: Arta Silence, MD;  Location: [facility];  Service: Endoscopy;  Laterality: N/A;  . BREAST LUMPECTOMY WITH RADIOACTIVE SEED AND SENTINEL LYMPH NODE BIOPSY Left 04/17/2019   Procedure: LEFT BREAST LUMPECTOMY WITH RADIOACTIVE SEED AND SENTINEL LYMPH NODE BIOPSY;  Surgeon: Donnie Mesa, MD;  Location: [facility];  Service: General;  Laterality: Left;  LMA, PEC BLOCK  . CESAREAN SECTION  1999  . COLONOSCOPY    . ESOPHAGOGASTRODUODENOSCOPY    . ESOPHAGOGASTRODUODENOSCOPY (EGD) WITH PROPOFOL N/A 09/07/2016   Procedure: ESOPHAGOGASTRODUODENOSCOPY (EGD) WITH PROPOFOL;  Surgeon: Arta Silence, MD;  Location: [facility];  Service: Endoscopy;  Laterality: N/A;  . HYSTERECTOMY ABDOMINAL WITH SALPINGECTOMY Bilateral 02/17/2017   Procedure: HYSTERECTOMY ABDOMINAL WITH SALPINGECTOMY;  Surgeon: Christophe Louis, MD;  Location: [facility];  Service: Gynecology;  Laterality: Bilateral;  . KNEE ARTHROSCOPY     for torn meniscus  . PORTACATH PLACEMENT Right 05/22/2019   Procedure: INSERTION PORT-A-CATH WITH ULTRASOUND GUIDANCE;  Surgeon: Donnie Mesa, MD;  Location: [facility];  Service: General;  Laterality: Right;  Endotrachial tube  . WISDOM TOOTH EXTRACTION    . WISDOM TOOTH EXTRACTION      FAMILY HISTORY: Family History  Problem Relation Age of Onset  . Hypertension Mother   . CAD Maternal Grandfather   . Breast cancer Paternal Grandmother   . HIV Brother   . Heart attack  Brother        sudden MI vs PE  . Breast cancer Sister 71  . Breast cancer Other        one dx 67s, others dx in 44s  . Colon polyps Neg Hx   . Colon cancer Neg Hx   . Liver disease Neg Hx    Patient's father was in his late 69s when he died from causes unknown to the patient.  Patient's mother is 83 years old (as of 04/2019). The patient denies a family hx of ovarian cancer. She reports breast cancer in her sister at age 66, in her paternal grandmother, and in 3 paternal cousins. She had 4 siblings, 2 brothers and 2 sisters, though only her sisters are still living.   GYNECOLOGIC HISTORY:  Patient's last menstrual period was 02/13/2017 (exact date). Menarche: 54 years old Age at first live birth: 54 years old [place] P 2 (twins) LMP 01/2017 Contraceptive: used from [id number] without complication HRT never used  Hysterectomy? Yes, 01/2017, benign pathology BSO? 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 She is divorced. She lives at home with her twin daughters, Maryjo Rochester and Edmon Crape, who are 54 years old fraternal twins and attending college remotely. Mekaila studied music  and communications at [employer].Edmon Crape graduated in IT and is currently working for [employer].    ADVANCED DIRECTIVES: Not in place; the patient intends to name her sister Madelon Lips as her HCPOA [phone])   HEALTH MAINTENANCE: Social History   Tobacco Use  . Smoking status: Never Smoker  . Smokeless tobacco: Never Used  Vaping Use  . Vaping Use: Never used  Substance Use Topics  . Alcohol use: No    Alcohol/week: 0.0 standard drinks  . Drug use: No     Colonoscopy: at age 19  PAP: 02/2018  Bone density: never done   No Known Allergies  Current Outpatient Medications  Medication Sig Dispense Refill  . amLODipine (NORVASC) 5 MG tablet Take 5 mg by mouth daily.   5  . anastrozole  (ARIMIDEX) 1 MG tablet Take 1 tablet (1 mg total) by mouth daily. 90 tablet 4  . Cholecalciferol 4000 UNITS CAPS Take 4,000 Units by mouth daily.     . citalopram (CELEXA) 40 MG tablet Take 40 mg by mouth daily.    Marland Kitchen dexamethasone (DECADRON) 4 MG tablet Take 2 tablets (8 mg total) by mouth 2 (two) times daily. Start the day before Taxotere. Then again the day after chemo for 3 days. 30 tablet 1  . EDARBYCLOR 40-25 MG TABS Take 1 tablet by mouth daily.  5  . ibuprofen (ADVIL) 200 MG tablet Take 400 mg by mouth every 6 (six) hours as needed for moderate pain.    Marland Kitchen KLOR-CON M20 20 MEQ tablet Take 20 mEq by mouth 2 (two) times daily.   1  . L-Methylfolate-B12-B6-B2 (CEREFOLIN) 07-29-48-5 MG TABS Take 1 tablet by mouth every morning. 90 tablet 3  . levothyroxine (SYNTHROID) 175 MCG tablet Take 175 mcg by mouth daily before breakfast.   5  . loratadine (CLARITIN) 10 MG tablet TAKE 1 TABLET BY MOUTH EVERY DAY 90 tablet 1  . metoprolol (TOPROL-XL) 200 MG 24 hr tablet Take 200 mg by mouth daily.   5  . pantoprazole (PROTONIX) 40 MG tablet Take 40 mg by mouth 2 (two) times daily.      No current facility-administered medications for this visit.    OBJECTIVE: African-American woman who appears stated age  There were no vitals filed for this visit.   There is no height or weight on file to calculate BMI.   Wt Readings from Last 3 Encounters:  10/24/19 (!) 310 lb 9.6 oz (140.9 kg)  08/06/19 (!) 306 lb 4 oz (138.9 kg)  08/01/19 (!) 306 lb 9.6 oz (139.1 kg)      ECOG FS:1 - Symptomatic but completely ambulatory  Telemedicine visit 12/30/2019  LAB RESULTS:  CMP     Component Value Date/Time   NA 142 10/24/2019 1323   K 3.4 (L) 10/24/2019 1323   CL 104 10/24/2019 1323   CO2 31 10/24/2019 1323   GLUCOSE 86 10/24/2019 1323   BUN 13 10/24/2019 1323   CREATININE 0.76 10/24/2019 1323   CREATININE 0.83 04/10/2019 1240   CALCIUM 9.8 10/24/2019 1323   PROT 6.9 10/24/2019 1323   ALBUMIN 3.6 10/24/2019  1323   AST 14 (L) 10/24/2019 1323   AST 17 04/10/2019 1240   ALT 9 10/24/2019 1323   ALT 19 04/10/2019 1240   ALKPHOS 62 10/24/2019 1323   BILITOT 0.4 10/24/2019 1323   BILITOT 0.4 04/10/2019 1240   GFRNONAA >60 10/24/2019 1323   GFRNONAA >60 04/10/2019 1240   GFRAA >60 10/24/2019 1323   GFRAA >  60 04/10/2019 1240    No results found for: TOTALPROTELP, ALBUMINELP, A1GS, A2GS, BETS, BETA2SER, GAMS, MSPIKE, SPEI  Lab Results  Component Value Date   WBC 4.2 10/24/2019   NEUTROABS 2.3 10/24/2019   HGB 11.0 (L) 10/24/2019   HCT 33.8 (L) 10/24/2019   MCV 83.9 10/24/2019   PLT 184 10/24/2019    No results found for: LABCA2  No components found for: STMHDQ222  No results for input(s): INR in the last 168 hours.  No results found for: LABCA2  No results found for: LNL892  No results found for: JJH417  No results found for: EYC144  No results found for: CA2729  No components found for: HGQUANT  No results found for: CEA1 / No results found for: CEA1   No results found for: AFPTUMOR  No results found for: CHROMOGRNA  No results found for: KPAFRELGTCHN, LAMBDASER, KAPLAMBRATIO (kappa/lambda light chains)  No results found for: HGBA, HGBA2QUANT, HGBFQUANT, HGBSQUAN (Hemoglobinopathy evaluation)   No results found for: LDH  No results found for: IRON, TIBC, IRONPCTSAT (Iron and TIBC)  No results found for: FERRITIN  Urinalysis No results found for: COLORURINE, APPEARANCEUR, LABSPEC, PHURINE, GLUCOSEU, HGBUR, BILIRUBINUR, KETONESUR, PROTEINUR, UROBILINOGEN, NITRITE, LEUKOCYTESUR   STUDIES: No results found.   ELIGIBLE FOR AVAILABLE RESEARCH PROTOCOL:BR003?--Depending on final surgical pathology  ASSESSMENT: 54 y.o. [place] woman status post left breast upper outer quadrant biopsy 04/03/2019 for a clinical T1b N0, stage IB invasive ductal carcinoma, grade 3, functionally triple negative (estrogen receptor is weakly positive at 10%, progesterone receptor  negative, HER-2 not amplified), with an MIB-1 of 30%  (1) status post left lumpectomy and sentinel lymph node sampling 04/17/2019 for a pT1c pN0, stage IB invasive ductal carcinoma, grade 3, triple negative, with negative margins  (a) a total of 2 sentinel lymph nodes were removed  (2) adjuvant chemotherapy consisting of cyclophosphamide and docetaxel every 21 days x 4 started 05/23/2019, completed 07/25/2019 with no dose reductions or delays  (3) adjuvant radiation 08/13/2019 - 09/27/2019  (a) left breast / 50.4 Gy in 28 fractions  (b) seroma boost / 10 Gy in 5 fractions  (4) genetics testing 04/19/2019 through the [facility] Breast Cancer STAT Panel + Common Hereditary Cancers Panel 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 STK11, TP53, TSC1, TSC2, and VHL.  The following genes were evaluated for sequence changes only: SDHA and HOXB13 c.251G>A variant only.  (b) current data suggests an increase risk of breast and ovarian cancer in patients carrying a BARD1 mutation, but data is preliminary and insufficient for definitive recommendations  (5) prophylactic anastrozole started 10/24/2019  (6) ntensified screening: plan breast MRI September, mammography March on a yearly basis  (7) consider bilateral salpingo-oophorectomy prophylactically   PLAN: Manya is tolerating anastrozole moderately well.  She does seem to have some carpal  tunnel on the left and anastrozole could be making that a bit worse.  I suggested she try a wrist splint especially at night and see if that improves the symptoms.  Otherwise the plan  will be to continue anastrozole for a minimum of 5 years.  She will be due for mammography in March 2022.  I will see her shortly after that  She knows to call for any other issue that may develop before then.  Virgie Dad. Mollye Guinta, MD 12/30/19 12:52 PM Medical Oncology and Hematology  [facility] [place], [place] [address] Tel. [phone]    Fax. [phone]  . I, Wilburn Mylar, am acting as scribe for Dr. Sarajane Jews C. Talibah Colasurdo.  I, Lurline Del MD, have reviewed the above documentation for accuracy and completeness, and I agree with the above.   *Total Encounter Time as defined by the Centers for Medicare and Medicaid Services includes, in addition to the face-to-face time of a patient visit (documented in the note above) non-face-to-face time: obtaining and reviewing outside history, ordering and reviewing medications, tests or procedures, care coordination (communications with other health care professionals or caregivers) and documentation in the medical record.

## 2019-12-30 ENCOUNTER — Inpatient Hospital Stay: Payer: BC Managed Care – PPO | Attending: Oncology | Admitting: Oncology

## 2019-12-30 DIAGNOSIS — C50412 Malignant neoplasm of upper-outer quadrant of left female breast: Secondary | ICD-10-CM

## 2019-12-30 DIAGNOSIS — Z170 Estrogen receptor positive status [ER+]: Secondary | ICD-10-CM | POA: Diagnosis not present

## 2020-01-03 ENCOUNTER — Other Ambulatory Visit (HOSPITAL_COMMUNITY)
Admission: RE | Admit: 2020-01-03 | Discharge: 2020-01-03 | Disposition: A | Discharge status: Home / Self Care | Payer: BC Managed Care – PPO | Source: Ambulatory Visit | Attending: Surgery | Admitting: Surgery

## 2020-01-03 DIAGNOSIS — Z01812 Encounter for preprocedural laboratory examination: Secondary | ICD-10-CM | POA: Diagnosis not present

## 2020-01-03 DIAGNOSIS — Z20822 Contact with and (suspected) exposure to covid-19: Secondary | ICD-10-CM | POA: Insufficient documentation

## 2020-01-03 LAB — SARS CORONAVIRUS 2 (TAT 6-24 HRS): SARS Coronavirus 2: NEGATIVE

## 2020-01-06 ENCOUNTER — Encounter (HOSPITAL_COMMUNITY): Payer: Self-pay | Admitting: Surgery

## 2020-01-06 ENCOUNTER — Telehealth: Payer: Self-pay

## 2020-01-06 ENCOUNTER — Other Ambulatory Visit: Payer: Self-pay | Admitting: Oncology

## 2020-01-06 DIAGNOSIS — Z170 Estrogen receptor positive status [ER+]: Secondary | ICD-10-CM

## 2020-01-06 DIAGNOSIS — C50412 Malignant neoplasm of upper-outer quadrant of left female breast: Secondary | ICD-10-CM

## 2020-01-06 NOTE — Progress Notes (Signed)
Loretta Nelson fell during a conference on October 28.  She injured her left side a little bit.  Yesterday she noted a mass in her left breast which she had not noted before.  I asked her to come in for review.  She does have a palpable mass in the upper outer quadrant.  Reviewing the MRI in that area she has a known seroma there.  The cat is said that she did not feel this before and she does feel it now.  It is possible that that she has dislodged or otherwise reinjured the area after her fall but the simplest thing is to do an ultrasound and I have scheduled that to be compared to prior  I recommended that she go ahead and get her port removed tomorrow anyway as she is planning to do

## 2020-01-06 NOTE — Telephone Encounter (Signed)
Pt called to report new lump to left breast.   Pt with h/o triple negative breast cancer.  Completed chemotherapy, radiation, and recently had MRI in Sept that was WNL.  Pt scheduled to have port removed on 11/9.    Pt reports lump to left breast being slightly larger than a quarter.  Tenderness to the touch.   Patient denies any redness, swelling, or drainage to left breast.    RN reviewed with MD - Pt will come to clinic today for evaluation by MD.  Pt aware, verbalized understanding and agreement.

## 2020-01-06 NOTE — Progress Notes (Signed)
Spoke with pt for pre-op call. Pt states the only cardiac history she has is a benign heart murmur. Pt states she is not diabetic.  Pt states she is seeing Dr. Jana Hakim this afternoon because she has felt something in her breast. I told her that we would go ahead and do this call and so if everything is ok, she'll be ready to have the surgery.  Covid test done 01/03/20 and it's negative. She states she has been in quarantine since the test was done. She will wear her mask and social distance when she goes to Dr. Virgie Dad office this afternoon and then will be back in quarantine afterwards.

## 2020-01-07 ENCOUNTER — Ambulatory Visit (HOSPITAL_COMMUNITY): Payer: BC Managed Care – PPO | Admitting: Certified Registered Nurse Anesthetist

## 2020-01-07 ENCOUNTER — Other Ambulatory Visit: Payer: Self-pay

## 2020-01-07 ENCOUNTER — Ambulatory Visit (HOSPITAL_COMMUNITY)
Admission: RE | Admit: 2020-01-07 | Discharge: 2020-01-07 | Disposition: A | Discharge status: Home / Self Care | Payer: BC Managed Care – PPO | Attending: Surgery | Admitting: Surgery

## 2020-01-07 ENCOUNTER — Encounter (HOSPITAL_COMMUNITY): Payer: Self-pay | Admitting: Surgery

## 2020-01-07 ENCOUNTER — Encounter (HOSPITAL_COMMUNITY)
Admission: RE | Disposition: A | Discharge status: Home / Self Care | Payer: Self-pay | Source: Home / Self Care | Attending: Surgery

## 2020-01-07 DIAGNOSIS — Z79811 Long term (current) use of aromatase inhibitors: Secondary | ICD-10-CM | POA: Insufficient documentation

## 2020-01-07 DIAGNOSIS — Z79899 Other long term (current) drug therapy: Secondary | ICD-10-CM | POA: Insufficient documentation

## 2020-01-07 DIAGNOSIS — C50912 Malignant neoplasm of unspecified site of left female breast: Secondary | ICD-10-CM | POA: Insufficient documentation

## 2020-01-07 DIAGNOSIS — Z452 Encounter for adjustment and management of vascular access device: Secondary | ICD-10-CM | POA: Insufficient documentation

## 2020-01-07 DIAGNOSIS — Z9221 Personal history of antineoplastic chemotherapy: Secondary | ICD-10-CM | POA: Insufficient documentation

## 2020-01-07 DIAGNOSIS — Z923 Personal history of irradiation: Secondary | ICD-10-CM | POA: Insufficient documentation

## 2020-01-07 DIAGNOSIS — Z79890 Hormone replacement therapy (postmenopausal): Secondary | ICD-10-CM | POA: Diagnosis not present

## 2020-01-07 HISTORY — PX: PORT-A-CATH REMOVAL: SHX5289

## 2020-01-07 LAB — CBC
HCT: 35.5 % — ABNORMAL LOW (ref 36.0–46.0)
Hemoglobin: 11.3 g/dL — ABNORMAL LOW (ref 12.0–15.0)
MCH: 26.6 pg (ref 26.0–34.0)
MCHC: 31.8 g/dL (ref 30.0–36.0)
MCV: 83.5 fL (ref 80.0–100.0)
Platelets: 184 10*3/uL (ref 150–400)
RBC: 4.25 MIL/uL (ref 3.87–5.11)
RDW: 14.4 % (ref 11.5–15.5)
WBC: 4.3 10*3/uL (ref 4.0–10.5)
nRBC: 0 % (ref 0.0–0.2)

## 2020-01-07 LAB — BASIC METABOLIC PANEL
Anion gap: 9 (ref 5–15)
BUN: 12 mg/dL (ref 6–20)
CO2: 28 mmol/L (ref 22–32)
Calcium: 9.4 mg/dL (ref 8.9–10.3)
Chloride: 102 mmol/L (ref 98–111)
Creatinine, Ser: 0.76 mg/dL (ref 0.44–1.00)
GFR, Estimated: >60 mL/min (ref >60)
Glucose, Bld: 96 mg/dL (ref 70–99)
Potassium: 3.6 mmol/L (ref 3.5–5.1)
Sodium: 139 mmol/L (ref 135–145)

## 2020-01-07 SURGERY — REMOVAL PORT-A-CATH
Anesthesia: Monitor Anesthesia Care | Site: Chest | Laterality: Right

## 2020-01-07 MED ORDER — MIDAZOLAM HCL 5 MG/5ML IJ SOLN
INTRAMUSCULAR | Status: DC | PRN
  Administered 2020-01-07 (×2): 1 mg via INTRAVENOUS

## 2020-01-07 MED ORDER — BUPIVACAINE HCL (PF) 0.25 % IJ SOLN
INTRAMUSCULAR | Status: DC | PRN
  Administered 2020-01-07: 10 mL

## 2020-01-07 MED ORDER — MIDAZOLAM HCL 2 MG/2ML IJ SOLN
INTRAMUSCULAR | Status: AC
  Filled 2020-01-07: qty 2

## 2020-01-07 MED ORDER — FENTANYL CITRATE (PF) 250 MCG/5ML IJ SOLN
INTRAMUSCULAR | Status: AC
  Filled 2020-01-07: qty 5

## 2020-01-07 MED ORDER — PROPOFOL 500 MG/50ML IV EMUL
INTRAVENOUS | Status: DC | PRN
  Administered 2020-01-07: 50 ug/kg/min via INTRAVENOUS

## 2020-01-07 MED ORDER — FENTANYL CITRATE (PF) 100 MCG/2ML IJ SOLN: 25.0000 ug | INTRAMUSCULAR | Status: DC | PRN

## 2020-01-07 MED ORDER — ORAL CARE MOUTH RINSE: 15.0000 mL | Freq: Once | OROMUCOSAL | Status: AC

## 2020-01-07 MED ORDER — DEXAMETHASONE SODIUM PHOSPHATE 10 MG/ML IJ SOLN
INTRAMUSCULAR | Status: AC
  Filled 2020-01-07: qty 1

## 2020-01-07 MED ORDER — ONDANSETRON HCL 4 MG/2ML IJ SOLN
INTRAMUSCULAR | Status: DC | PRN
  Administered 2020-01-07: 4 mg via INTRAVENOUS

## 2020-01-07 MED ORDER — CHLORHEXIDINE GLUCONATE CLOTH 2 % EX PADS: 6.0000 | MEDICATED_PAD | Freq: Once | CUTANEOUS | Status: DC

## 2020-01-07 MED ORDER — LACTATED RINGERS IV SOLN: INTRAVENOUS | Status: DC

## 2020-01-07 MED ORDER — OXYCODONE HCL 5 MG PO TABS
ORAL_TABLET | ORAL | Status: AC
  Administered 2020-01-07: 5 mg via ORAL
  Filled 2020-01-07: qty 1

## 2020-01-07 MED ORDER — BUPIVACAINE HCL (PF) 0.25 % IJ SOLN
INTRAMUSCULAR | Status: AC
  Filled 2020-01-07: qty 30

## 2020-01-07 MED ORDER — FENTANYL CITRATE (PF) 250 MCG/5ML IJ SOLN
INTRAMUSCULAR | Status: DC | PRN
  Administered 2020-01-07: 25 ug via INTRAVENOUS

## 2020-01-07 MED ORDER — LIDOCAINE 2% (20 MG/ML) 5 ML SYRINGE
INTRAMUSCULAR | Status: AC
  Filled 2020-01-07: qty 5

## 2020-01-07 MED ORDER — CEFAZOLIN SODIUM-DEXTROSE 2-4 GM/100ML-% IV SOLN
2.0000 g | INTRAVENOUS | Status: AC
  Administered 2020-01-07: 3 g via INTRAVENOUS
  Filled 2020-01-07: qty 100

## 2020-01-07 MED ORDER — CHLORHEXIDINE GLUCONATE 0.12 % MT SOLN
15.0000 mL | Freq: Once | OROMUCOSAL | Status: AC
  Administered 2020-01-07: 15 mL via OROMUCOSAL
  Filled 2020-01-07: qty 15

## 2020-01-07 MED ORDER — OXYCODONE HCL 5 MG/5ML PO SOLN: 5.0000 mg | Freq: Once | ORAL | Status: AC | PRN

## 2020-01-07 MED ORDER — 0.9 % SODIUM CHLORIDE (POUR BTL) OPTIME
TOPICAL | Status: DC | PRN
  Administered 2020-01-07: 1000 mL

## 2020-01-07 MED ORDER — PROPOFOL 10 MG/ML IV BOLUS
INTRAVENOUS | Status: AC
  Filled 2020-01-07: qty 20

## 2020-01-07 MED ORDER — ONDANSETRON HCL 4 MG/2ML IJ SOLN: 4.0000 mg | Freq: Four times a day (QID) | INTRAMUSCULAR | Status: DC | PRN

## 2020-01-07 MED ORDER — OXYCODONE HCL 5 MG PO TABS: 5.0000 mg | ORAL_TABLET | Freq: Once | ORAL | Status: AC | PRN

## 2020-01-07 MED ORDER — ONDANSETRON HCL 4 MG/2ML IJ SOLN
INTRAMUSCULAR | Status: AC
  Filled 2020-01-07: qty 2

## 2020-01-07 SURGICAL SUPPLY — 31 items
APL SKNCLS STERI-STRIP NONHPOA (GAUZE/BANDAGES/DRESSINGS) ×1
BENZOIN TINCTURE PRP APPL 2/3 (GAUZE/BANDAGES/DRESSINGS) ×2 IMPLANT
CHLORAPREP W/TINT 10.5 ML (MISCELLANEOUS) ×2 IMPLANT
COVER SURGICAL LIGHT HANDLE (MISCELLANEOUS) ×2 IMPLANT
DECANTER SPIKE VIAL GLASS SM (MISCELLANEOUS) ×2 IMPLANT
DRAPE LAPAROTOMY T 102X78X121 (DRAPES) ×2 IMPLANT
DRSG TEGADERM 2-3/8X2-3/4 SM (GAUZE/BANDAGES/DRESSINGS) ×2 IMPLANT
ELECT REM PT RETURN 9FT ADLT (ELECTROSURGICAL) ×2
ELECTRODE REM PT RTRN 9FT ADLT (ELECTROSURGICAL) ×1 IMPLANT
GAUZE 4X4 16PLY RFD (DISPOSABLE) ×2 IMPLANT
GAUZE SPONGE 2X2 8PLY STRL LF (GAUZE/BANDAGES/DRESSINGS) ×1 IMPLANT
GLOVE BIO SURGEON STRL SZ7 (GLOVE) ×2 IMPLANT
GLOVE BIOGEL PI IND STRL 7.5 (GLOVE) ×1 IMPLANT
GLOVE BIOGEL PI INDICATOR 7.5 (GLOVE) ×1
GOWN STRL REUS W/ TWL LRG LVL3 (GOWN DISPOSABLE) ×2 IMPLANT
GOWN STRL REUS W/TWL LRG LVL3 (GOWN DISPOSABLE) ×4
KIT BASIN OR (CUSTOM PROCEDURE TRAY) ×2 IMPLANT
KIT TURNOVER KIT B (KITS) ×2 IMPLANT
NEEDLE HYPO 25GX1X1/2 BEV (NEEDLE) ×2 IMPLANT
NS IRRIG 1000ML POUR BTL (IV SOLUTION) ×2 IMPLANT
PACK GENERAL/GYN (CUSTOM PROCEDURE TRAY) ×2 IMPLANT
PAD ARMBOARD 7.5X6 YLW CONV (MISCELLANEOUS) ×4 IMPLANT
PENCIL SMOKE EVACUATOR (MISCELLANEOUS) ×2 IMPLANT
SPONGE GAUZE 2X2 STER 10/PKG (GAUZE/BANDAGES/DRESSINGS) ×1
STRIP CLOSURE SKIN 1/2X4 (GAUZE/BANDAGES/DRESSINGS) ×2 IMPLANT
SUT MON AB 4-0 PC3 18 (SUTURE) ×2 IMPLANT
SUT VIC AB 3-0 SH 27 (SUTURE) ×2
SUT VIC AB 3-0 SH 27X BRD (SUTURE) ×1 IMPLANT
SYR CONTROL 10ML LL (SYRINGE) ×2 IMPLANT
TOWEL GREEN STERILE (TOWEL DISPOSABLE) ×2 IMPLANT
TOWEL GREEN STERILE FF (TOWEL DISPOSABLE) ×2 IMPLANT

## 2020-01-07 NOTE — Discharge Instructions (Signed)
° ° ° °  PORT-A-CATH REMOVAL: POST OP INSTRUCTIONS  Always review your discharge instruction sheet given to you by the facility where your surgery was performed.   1. You make take acetaminophen (Tylenol) or ibuprofen (Advil) as needed.  Ice packs can also be used. 2. Take your usually prescribed medications unless otherwise directed. 3. You should follow a light diet for the remainder of the day after your procedure. 4. Most patients will experience some mild swelling and/or bruising in the area of the incision. It may take several days to resolve. 5. It is common to experience some constipation if taking pain medication after surgery. Increasing fluid intake and taking a stool softener (such as Colace) will usually help or prevent this problem from occurring. A mild laxative (Milk of Magnesia or Miralax) should be taken according to package directions if there are no bowel movements after 48 hours.  6. Unless discharge instructions indicate otherwise, you may remove your bandages 48 hours after surgery, and you may shower at that time. You may have steri-strips (small white skin tapes) in place directly over the incision.  These strips should be left on the skin for 7-10 days.   7. ACTIVITIES:  Limit activity involving your arms for the next 72 hours. Do no strenuous exercise or activity for 1 week. You may drive when you are no longer taking prescription pain medication, you can comfortably wear a seatbelt, and you can maneuver your car. 10.You may need to see your doctor in the office for a follow-up appointment.  Please       check with your doctor.    WHEN TO CALL YOUR DOCTOR [phone]): 1. Fever over 101.0 2. Chills 3. Continued bleeding from incision 4. Increased redness and tenderness at the site 5. Shortness of breath, difficulty breathing   The clinic staff is available to answer your questions during regular business hours. Please dont hesitate to call and ask to speak to one  of the nurses or medical assistants for clinical concerns. If you have a medical emergency, go to the nearest emergency room or call 911.  A surgeon from [facility] is always on call at the hospital.     For further information, please visit [email or web address]

## 2020-01-07 NOTE — Anesthesia Preprocedure Evaluation (Signed)
Anesthesia Evaluation  Patient identified by MRN, date of birth, ID band Patient awake    Reviewed: Allergy & Precautions, H&P , NPO status , Patient's Chart, lab work & pertinent test results  Airway Mallampati: II   Neck ROM: full    Dental   Pulmonary sleep apnea ,    breath sounds clear to auscultation       Cardiovascular hypertension,  Rhythm:regular Rate:Normal     Neuro/Psych  Headaches, PSYCHIATRIC DISORDERS Anxiety    GI/Hepatic hiatal hernia, GERD  ,  Endo/Other  Hypothyroidism Hyperthyroidism Morbid obesity  Renal/GU      Musculoskeletal  (+) Arthritis ,   Abdominal   Peds  Hematology   Anesthesia Other Findings   Reproductive/Obstetrics                             Anesthesia Physical Anesthesia Plan  ASA: II  Anesthesia Plan: MAC   Post-op Pain Management:    Induction: Intravenous  PONV Risk Score and Plan: 2 and Ondansetron, Propofol infusion and Treatment may vary due to age or medical condition  Airway Management Planned: Simple Face Mask  Additional Equipment:   Intra-op Plan:   Post-operative Plan:   Informed Consent: I have reviewed the patients History and Physical, chart, labs and discussed the procedure including the risks, benefits and alternatives for the proposed anesthesia with the patient or authorized representative who has indicated his/her understanding and acceptance.       Plan Discussed with: CRNA, Anesthesiologist and Surgeon  Anesthesia Plan Comments:         Anesthesia Quick Evaluation

## 2020-01-07 NOTE — Op Note (Addendum)
Port removal Procedure Note  Indications: Port-A-Cath in place, completion of chemotherapy   Pre-operative Diagnosis: Port-A-Cath in place Post-operative Diagnosis: same  Surgeon: Donnie Mesa, MD  Assistants: Sheria Lang, MD  I was present for the critical and key portions of the surgery and I was immediately available throughout the enitre procedure.  I have reviewed and agree with the operative note as documented by the resident.  Anesthesia: Monitored Local Anesthesia with Sedation  ASA Class: 2  Procedure Details  The patient was seen again in the Holding Room. The risks, benefits, complications, treatment options, and expected outcomes were discussed with the patient. The site of surgery was properly noted/marked. The patient was taken to the Operating Room, identified as Loretta Nelson, and the procedure verified as right Port-A-Cath removal. A Time Out was held and the above information confirmed.  After instillation of local anesthetic, a transverse incision was made over her right upper chest Port-A-Cath. Blunt and electrocautery dissection was carried down to the port. The two Prolene sutures were removed, the catheter tubing was removed, direct pressure was applied over the neck vein, and the port was removed from the incision. A portion of the capsule was removed. Hemostasis was ensured. The wound was closed with a running 3-0 Vicryl suture for the subcutaneous tissue and a running 4-0 Monocryl for the skin. Steri-strips and gauze dressings were sterilely applied. At the end of the case all counts were correct. The patient tolerated the procedure well, was moved to her stretcher, and transported to the PACU in stable condition.    Estimated Blood Loss: Minimal                 Complications: None; patient tolerated the procedure well.         Disposition: PACU - hemodynamically stable.         Condition: stable

## 2020-01-07 NOTE — Transfer of Care (Signed)
Immediate Anesthesia Transfer of Care Note  Patient: Loretta Nelson  Procedure(s) Performed: REMOVAL PORT-A-CATH (Right Chest)  Patient Location: PACU  Anesthesia Type:MAC  Level of Consciousness: awake  Airway & Oxygen Therapy: Patient Spontanous Breathing  Post-op Assessment: Report given to RN and Post -op Vital signs reviewed and stable  Post vital signs: Reviewed and stable  Last Vitals:  Vitals Value Taken Time  BP 146/76 01/07/20 1512  Temp    Pulse 63 01/07/20 1514  Resp 14 01/07/20 1514  SpO2 100 % 01/07/20 1514  Vitals shown include unvalidated device data.  Last Pain:  Vitals:   01/07/20 1303  TempSrc:   PainSc: 0-No pain         Complications: No complications documented.

## 2020-01-07 NOTE — H&P (Signed)
Loretta Nelson is an 54 y.o. female.   Chief Complaint: Left breast cancer HPI: 54 year old female with left triple negative breast cancer - completed chemotherapy and radiation..  She presents now for port removal.  Past Medical History:  Diagnosis Date  . Allergic rhinitis   . Anemia    low iron  . Anemia   . Anxiety   . Anxiety   . Arthritis    right knee, surgery done  . Cancer ([place]) 2021   left breast   . Complication of anesthesia    woke up during EGD  . Esophageal stricture   . Family history of breast cancer   . GERD (gastroesophageal reflux disease)   . Graves disease 01/13/11   radioactive iodine treatment, 95.2 millicuries  . Headache    tension headaches  . Hiatal hernia    small  . Hypertension   . Hyperthyroidism   . Hypothyroidism    s/p treatment  . Murmur    benign, h/o, caused by hyperdynamic contraction  . Obesity   . Pneumonia 2017   inhaler prescribed  . Sleep apnea 2021   uses a cpap  . Tendonitis of shoulder, right   . Vitamin D deficiency   . Vitamin D deficiency     Past Surgical History:  Procedure Laterality Date  . BALLOON DILATION N/A 09/07/2016   Procedure: BALLOON DILATION;  Surgeon: Arta Silence, MD;  Location: [facility] ENDOSCOPY;  Service: Endoscopy;  Laterality: N/A;  . BREAST LUMPECTOMY WITH RADIOACTIVE SEED AND SENTINEL LYMPH NODE BIOPSY Left 04/17/2019   Procedure: LEFT BREAST LUMPECTOMY WITH RADIOACTIVE SEED AND SENTINEL LYMPH NODE BIOPSY;  Surgeon: Donnie Mesa, MD;  Location: [place];  Service: General;  Laterality: Left;  LMA, PEC BLOCK  . CESAREAN SECTION  1999  . COLONOSCOPY    . ESOPHAGOGASTRODUODENOSCOPY    . ESOPHAGOGASTRODUODENOSCOPY (EGD) WITH PROPOFOL N/A 09/07/2016   Procedure: ESOPHAGOGASTRODUODENOSCOPY (EGD) WITH PROPOFOL;  Surgeon: Arta Silence, MD;  Location: [place];  Service: Endoscopy;  Laterality: N/A;  . HYSTERECTOMY ABDOMINAL WITH SALPINGECTOMY Bilateral 02/17/2017   Procedure: HYSTERECTOMY  ABDOMINAL WITH SALPINGECTOMY;  Surgeon: Christophe Louis, MD;  Location: [place] [facility];  Service: Gynecology;  Laterality: Bilateral;  . KNEE ARTHROSCOPY     for torn meniscus  . PORTACATH PLACEMENT Right 05/22/2019   Procedure: INSERTION PORT-A-CATH WITH ULTRASOUND GUIDANCE;  Surgeon: Donnie Mesa, MD;  Location: [facility];  Service: General;  Laterality: Right;  Endotrachial tube  . WISDOM TOOTH EXTRACTION    . WISDOM TOOTH EXTRACTION      Family History  Problem Relation Age of Onset  . Hypertension Mother   . CAD Maternal Grandfather   . Breast cancer Paternal Grandmother   . HIV Brother   . Heart attack Brother        sudden MI vs PE  . Breast cancer Sister 51  . Breast cancer Other        one dx 30s, others dx in 40s  . Colon polyps Neg Hx   . Colon cancer Neg Hx   . Liver disease Neg Hx    Social History:  reports that she has never smoked. She has never used smokeless tobacco. She reports that she does not drink alcohol and does not use drugs.  Allergies: No Known Allergies  Medications Prior to Admission  Medication Sig Dispense Refill  . amLODipine (NORVASC) 5 MG tablet Take 5 mg by mouth daily.   5  . anastrozole (ARIMIDEX) 1 MG tablet  Take 1 tablet (1 mg total) by mouth daily. 90 tablet 4  . Cholecalciferol 4000 UNITS CAPS Take 4,000 Units by mouth daily.     . citalopram (CELEXA) 40 MG tablet Take 40 mg by mouth daily.    Marland Kitchen EDARBYCLOR 40-25 MG TABS Take 1 tablet by mouth daily.  5  . ibuprofen (ADVIL) 200 MG tablet Take 400 mg by mouth every 6 (six) hours as needed for moderate pain.    Marland Kitchen KLOR-CON M20 20 MEQ tablet Take 20 mEq by mouth 2 (two) times daily.   1  . L-Methylfolate-B12-B6-B2 (CEREFOLIN) 07-29-48-5 MG TABS Take 1 tablet by mouth every morning. 90 tablet 3  . levothyroxine (SYNTHROID) 175 MCG tablet Take 175 mcg by mouth daily before breakfast.   5  . loratadine (CLARITIN) 10 MG tablet TAKE 1 TABLET BY MOUTH EVERY DAY (Patient taking differently: Take 10 mg by mouth  daily as needed for allergies. ) 90 tablet 1  . metoprolol (TOPROL-XL) 200 MG 24 hr tablet Take 200 mg by mouth daily.   5  . pantoprazole (PROTONIX) 40 MG tablet Take 40 mg by mouth 2 (two) times daily.     . potassium chloride (KLOR-CON) 20 MEQ packet Take 20 mEq by mouth daily.    Marland Kitchen dexamethasone (DECADRON) 4 MG tablet Take 2 tablets (8 mg total) by mouth 2 (two) times daily. Start the day before Taxotere. Then again the day after chemo for 3 days. (Patient not taking: Reported on 01/02/2020) 30 tablet 1    No results found for this or any previous visit (from the past 48 hour(s)). No results found.  Height 5\' 4"  (1.626 m), weight (!) 136.5 kg, last menstrual period 02/13/2017. Physical Exam  Right chest wall port site - clean, dry, Left breast seroma   Assessment/Plan Breast cancer s/p chemotherapy.    Port removal.  The surgical procedure has been discussed with the patient.  Potential risks, benefits, alternative treatments, and expected outcomes have been explained.  All of the patient's questions at this time have been answered.  The likelihood of reaching the patient's treatment goal is good.  The patient understand the proposed surgical procedure and wishes to proceed.   Maia Petties, MD 01/07/2020, 12:14 PM

## 2020-01-08 ENCOUNTER — Encounter (HOSPITAL_COMMUNITY): Payer: Self-pay | Admitting: Surgery

## 2020-01-08 NOTE — Anesthesia Postprocedure Evaluation (Signed)
Anesthesia Post Note  Patient: Loretta Nelson  Procedure(s) Performed: REMOVAL PORT-A-CATH (Right Chest)     Patient location during evaluation: PACU Anesthesia Type: MAC Level of consciousness: awake and alert Pain management: pain level controlled Vital Signs Assessment: post-procedure vital signs reviewed and stable Respiratory status: spontaneous breathing, nonlabored ventilation, respiratory function stable and patient connected to nasal cannula oxygen Cardiovascular status: stable and blood pressure returned to baseline Postop Assessment: no apparent nausea or vomiting Anesthetic complications: no   No complications documented.  Last Vitals:  Vitals:   01/07/20 1545 01/07/20 1600  BP: (!) 146/76 (!) 153/83  Pulse: (!) 58 67  Resp: 19 (!) 24  Temp:  36.8 C  SpO2: 100% 100%    Last Pain:  Vitals:   01/07/20 1545  TempSrc:   PainSc: 3                  Barnet Glasgow

## 2020-01-10 ENCOUNTER — Encounter: Payer: Self-pay | Admitting: Oncology

## 2020-01-16 ENCOUNTER — Other Ambulatory Visit: Payer: Self-pay | Admitting: Oncology

## 2020-01-16 DIAGNOSIS — C50412 Malignant neoplasm of upper-outer quadrant of left female breast: Secondary | ICD-10-CM

## 2020-01-16 DIAGNOSIS — Z170 Estrogen receptor positive status [ER+]: Secondary | ICD-10-CM

## 2020-01-21 ENCOUNTER — Ambulatory Visit
Admission: RE | Admit: 2020-01-21 | Discharge: 2020-01-21 | Disposition: A | Discharge status: Home / Self Care | Payer: BC Managed Care – PPO | Source: Ambulatory Visit | Attending: Oncology | Admitting: Oncology

## 2020-01-21 ENCOUNTER — Other Ambulatory Visit: Payer: Self-pay

## 2020-01-21 ENCOUNTER — Other Ambulatory Visit: Payer: Self-pay | Admitting: Oncology

## 2020-01-21 DIAGNOSIS — Z170 Estrogen receptor positive status [ER+]: Secondary | ICD-10-CM

## 2020-01-21 DIAGNOSIS — C50412 Malignant neoplasm of upper-outer quadrant of left female breast: Secondary | ICD-10-CM

## 2020-01-21 HISTORY — DX: Personal history of irradiation: Z92.3

## 2020-01-21 HISTORY — DX: Personal history of antineoplastic chemotherapy: Z92.21

## 2020-01-22 ENCOUNTER — Ambulatory Visit (INDEPENDENT_AMBULATORY_CARE_PROVIDER_SITE_OTHER): Payer: BC Managed Care – PPO | Admitting: Family Medicine

## 2020-01-22 ENCOUNTER — Encounter: Payer: Self-pay | Admitting: Family Medicine

## 2020-01-22 VITALS — BP 136/82 | HR 62 | Ht 64.0 in | Wt 300.0 lb

## 2020-01-22 DIAGNOSIS — R419 Unspecified symptoms and signs involving cognitive functions and awareness: Secondary | ICD-10-CM | POA: Diagnosis not present

## 2020-01-22 DIAGNOSIS — Z9989 Dependence on other enabling machines and devices: Secondary | ICD-10-CM

## 2020-01-22 DIAGNOSIS — G4733 Obstructive sleep apnea (adult) (pediatric): Secondary | ICD-10-CM

## 2020-01-22 NOTE — Progress Notes (Addendum)
PATIENT: Loretta Nelson DOB: [birth year]  REASON FOR VISIT: follow up HISTORY FROM: patient  Chief Complaint  Patient presents with  . Follow-up    cpap fu, alone, rm 1, pt states she is doing well on cpap      HISTORY OF PRESENT ILLNESS: Today 01/22/20 Loretta Nelson is a 54 y.o. female here today for follow up for OSA on CPAP.  She is doing very well with CPAP therapy since last being seen.  She is using CPAP nightly.  She does report improved sleep quality when using CPAP.  She has had some intermittent insomnia since starting Arimidex.  She has completed chemo and radiation.  She is followed closely by primary care and oncology.  She is requesting a new referral for neurocognitive evaluation as previously discussed with Dr. Leonie Man. She does continue to have difficulty multitasking and concentration. She has word finding difficulty. She is able to work and maintain home. She can drive without difficulty and perform ADL's independently.   Compliance report dated 12/22/2019 through 01/20/2020 reveals that she used CPAP 30 of the past 30 for compliance of 100%.  She is CPAP greater than 4 hours 30 days for compliance of 100%.  Average usage was 7 hours and 58 minutes.  Residual AHI was 1 on a set pressure of 12 cm of water and EPR 3.  There was no significant leak noted.   HISTORY: (copied from Dr Guadelupe Sabin previous note)  Loretta Nelson is a 54 year old right-handed woman with an underlying medical history of hypertension, vitamin D deficiency, Graves' disease with status post radioactive iodine, reflux disease, arthritis, anxiety, anemia, allergic rhinitis, hypothyroidism, obesity with a BMI of over 30, and recent diagnosis of L sided breast cancer with status post surgery in February and adjuvant chemotherapy since March, with adjuvant radiation therapy pending, who Presents for follow-up consultation of her obstructive sleep apnea after interim testing and starting CPAP therapy.  The  patient is unaccompanied today.  I first met her at the request of Dr. Leonie Man on 02/27/2019, at which time she reported snoring and daytime somnolence and had cognitive complaints.  She was advised to proceed with a sleep study.  She had a baseline sleep study, followed by a CPAP titration study.  Her baseline sleep study from 03/15/2019 showed severe obstructive sleep apnea with a total AHI of 57.4/hour, REM AHI of 98.6/hour, supine AHI of 53/hour and O2 nadir of 73%. REM latency was delayed and REM percentage markedly reduced at 4%.  She was advised to return for a CPAP titration study.  She had this on 04/15/2019.  She was fitted with a nasal mask but would prefer nasal pillows at home as she indicated at the time of her sleep study.  She was titrated from 6 cm to 14 cm.  On the final pressure, her AHI was 0.9/h with supine REM sleep achieved an O2 nadir at 90%.  She was advised to start CPAP therapy at home at a pressure of 14 cm.  She emailed at the end of March due to difficulty tolerating the pressure and I reduced it to 12 cm.   Today, 07/23/2019: I reviewed her CPAP compliance data from 06/22/2019 through 07/21/2019, which is a total of 30 days, during which time she used her machine every night with percent use days greater than 4 hours at 97%, indicating excellent compliance with an average usage of 6 hours, residual AHI at goal at 1.3/hour, Leak acceptable with a 95th  percentile at 9.8 L/min on a pressure of 12 cm with EPR of 3.  She reports that she has had difficulty sleeping since starting chemotherapy. Especially the first 2 weeks after her chemotherapy cycle she is having a tough time. She was diagnosed with left-sided breast cancer in the middle of going through sleep study testing. She has 1 more chemotherapy cycle to go and will start adjuvant radiation therapy. She has a good support system, thankfully, both daughters have graduated college successfully. Her mother has moved him as well. She is  motivated to continue with CPAP and tolerates the reduced pressure better. She is currently using a nasal cushion interface but also has a hybrid style fullface mask available. She has struggled with mouth dryness and tries to hydrate better. She had to delay her neuropsychological evaluation at [facility] neurolog[facility] because of her cancer treatment. She is planning to reschedule her appointment and then schedule a follow-up with Dr. Leonie Man.    REVIEW OF SYSTEMS: Out of a complete 14 system review of symptoms, the patient complains only of the following symptoms, inattention, lack of concentration, intermittent insomnia and all other reviewed systems are negative.  ALLERGIES: No Known Allergies  HOME MEDICATIONS: Outpatient Medications Prior to Visit  Medication Sig Dispense Refill  . amLODipine (NORVASC) 5 MG tablet Take 5 mg by mouth daily.   5  . anastrozole (ARIMIDEX) 1 MG tablet Take 1 tablet (1 mg total) by mouth daily. 90 tablet 4  . Cholecalciferol 4000 UNITS CAPS Take 4,000 Units by mouth daily.     . citalopram (CELEXA) 40 MG tablet Take 40 mg by mouth daily.    Marland Kitchen EDARBYCLOR 40-25 MG TABS Take 1 tablet by mouth daily.  5  . ibuprofen (ADVIL) 200 MG tablet Take 400 mg by mouth every 6 (six) hours as needed for moderate pain.    Marland Kitchen KLOR-CON M20 20 MEQ tablet Take 20 mEq by mouth 2 (two) times daily.   1  . L-Methylfolate-B12-B6-B2 (CEREFOLIN) 07-29-48-5 MG TABS Take 1 tablet by mouth every morning. 90 tablet 3  . levothyroxine (SYNTHROID) 175 MCG tablet Take 175 mcg by mouth daily before breakfast.   5  . loratadine (CLARITIN) 10 MG tablet TAKE 1 TABLET BY MOUTH EVERY DAY (Patient taking differently: Take 10 mg by mouth daily as needed for allergies. ) 90 tablet 1  . metoprolol (TOPROL-XL) 200 MG 24 hr tablet Take 200 mg by mouth daily.   5  . pantoprazole (PROTONIX) 40 MG tablet Take 40 mg by mouth 2 (two) times daily.     . potassium chloride (KLOR-CON) 20 MEQ packet Take 20 mEq by mouth  daily.     No facility-administered medications prior to visit.    PAST MEDICAL HISTORY: Past Medical History:  Diagnosis Date  . Allergic rhinitis   . Anemia    low iron  . Anemia   . Anxiety   . Anxiety   . Arthritis    right knee, surgery done  . Breast cancer ([place])   . Cancer ([place]) 2021   left breast   . Complication of anesthesia    woke up during EGD  . Esophageal stricture   . Family history of breast cancer   . GERD (gastroesophageal reflux disease)   . Graves disease 01/13/11   radioactive iodine treatment, 37.6 millicuries  . Headache    tension headaches  . Hiatal hernia    small  . Hypertension   . Hyperthyroidism   .  Hypothyroidism    s/p treatment  . Murmur    benign, h/o, caused by hyperdynamic contraction  . Obesity   . Personal history of chemotherapy   . Personal history of radiation therapy   . Pneumonia 2017   inhaler prescribed  . Sleep apnea 2021   uses a cpap  . Tendonitis of shoulder, right   . Vitamin D deficiency   . Vitamin D deficiency     PAST SURGICAL HISTORY: Past Surgical History:  Procedure Laterality Date  . BALLOON DILATION N/A 09/07/2016   Procedure: BALLOON DILATION;  Surgeon: Arta Silence, MD;  Location: [facility]COPY;  Service: Endoscopy;  Laterality: N/A;  . BREAST LUMPECTOMY Left 03/2019  . BREAST LUMPECTOMY WITH RADIOACTIVE SEED AND SENTINEL LYMPH NODE BIOPSY Left 04/17/2019   Procedure: LEFT BREAST LUMPECTOMY WITH RADIOACTIVE SEED AND SENTINEL LYMPH NODE BIOPSY;  Surgeon: Donnie Mesa, MD;  Location: [place];  Service: General;  Laterality: Left;  LMA, PEC BLOCK  . CESAREAN SECTION  1999  . COLONOSCOPY    . ESOPHAGOGASTRODUODENOSCOPY    . ESOPHAGOGASTRODUODENOSCOPY (EGD) WITH PROPOFOL N/A 09/07/2016   Procedure: ESOPHAGOGASTRODUODENOSCOPY (EGD) WITH PROPOFOL;  Surgeon: Arta Silence, MD;  Location: [place];  Service: Endoscopy;  Laterality: N/A;  . HYSTERECTOMY ABDOMINAL WITH SALPINGECTOMY Bilateral  02/17/2017   Procedure: HYSTERECTOMY ABDOMINAL WITH SALPINGECTOMY;  Surgeon: Christophe Louis, MD;  Location: [place] [facility];  Service: Gynecology;  Laterality: Bilateral;  . KNEE ARTHROSCOPY     for torn meniscus  . PORT-A-CATH REMOVAL Right 01/07/2020   Procedure: REMOVAL PORT-A-CATH;  Surgeon: Donnie Mesa, MD;  Location: [place];  Service: General;  Laterality: Right;  . PORTACATH PLACEMENT Right 05/22/2019   Procedure: INSERTION PORT-A-CATH WITH ULTRASOUND GUIDANCE;  Surgeon: Donnie Mesa, MD;  Location: [facility];  Service: General;  Laterality: Right;  Endotrachial tube  . WISDOM TOOTH EXTRACTION    . WISDOM TOOTH EXTRACTION      FAMILY HISTORY: Family History  Problem Relation Age of Onset  . Hypertension Mother   . CAD Maternal Grandfather   . Breast cancer Paternal Grandmother   . HIV Brother   . Heart attack Brother        sudden MI vs PE  . Breast cancer Sister 14  . Breast cancer Other        one dx 44s, others dx in 15s  . Colon polyps Neg Hx   . Colon cancer Neg Hx   . Liver disease Neg Hx     SOCIAL HISTORY: Social History   Socioeconomic History  . Marital status: Divorced    Spouse name: Not on file  . Number of children: Not on file  . Years of education: Not on file  . Highest education level: Not on file  Occupational History  . Not on file  Tobacco Use  . Smoking status: Never Smoker  . Smokeless tobacco: Never Used  Vaping Use  . Vaping Use: Never used  Substance and Sexual Activity  . Alcohol use: No    Alcohol/week: 0.0 standard drinks  . Drug use: No  . Sexual activity: Never    Birth control/protection: Abstinence  Other Topics Concern  . Not on file  Social History Narrative  . Not on file   Social Determinants of Health   Financial Resource Strain:   . Difficulty of Paying Living Expenses: Not on file  Food Insecurity:   . Worried About Charity fundraiser in the Last Year: Not on file  . Ran Out  of Food in the Last Year: Not on file    Transportation Needs:   . Lack of Transportation (Medical): Not on file  . Lack of Transportation (Non-Medical): Not on file  Physical Activity:   . Days of Exercise per Week: Not on file  . Minutes of Exercise per Session: Not on file  Stress:   . Feeling of Stress : Not on file  Social Connections:   . Frequency of Communication with Friends and Family: Not on file  . Frequency of Social Gatherings with Friends and Family: Not on file  . Attends Religious Services: Not on file  . Active Member of Clubs or Organizations: Not on file  . Attends Archivist Meetings: Not on file  . Marital Status: Not on file  Intimate Partner Violence:   . Fear of Current or Ex-Partner: Not on file  . Emotionally Abused: Not on file  . Physically Abused: Not on file  . Sexually Abused: Not on file     PHYSICAL EXAM  Vitals:   01/22/20 0903  BP: 136/82  Pulse: 62  Weight: 300 lb (136.1 kg)  Height: '5\' 4"'  (1.626 m)   Body mass index is 51.49 kg/m.  Generalized: Well developed, in no acute distress  Cardiology: normal rate and rhythm, no murmur noted Respiratory: clear to auscultation bilaterally  Neurological examination  Mentation: Alert oriented to time, place, history taking. Follows all commands speech and language fluent Cranial nerve II-XII: Pupils were equal round reactive to light. Extraocular movements were full, visual field were full  Motor: The motor testing reveals 5 over 5 strength of all 4 extremities. Good symmetric motor tone is noted throughout.  Gait and station: Gait is normal.    DIAGNOSTIC DATA (LABS, IMAGING, TESTING) - I reviewed patient records, labs, notes, testing and imaging myself where available.  MMSE - Mini Mental State Exam 01/15/2019  Orientation to time 5  Orientation to Place 5  Registration 3  Attention/ Calculation 5  Recall 3  Language- name 2 objects 2  Language- repeat 1  Language- follow 3 step command 3  Language- read &  follow direction 1  Write a sentence 1  Copy design 1  Total score 30     Lab Results  Component Value Date   WBC 4.3 01/07/2020   HGB 11.3 (L) 01/07/2020   HCT 35.5 (L) 01/07/2020   MCV 83.5 01/07/2020   PLT 184 01/07/2020      Component Value Date/Time   NA 139 01/07/2020 1228   K 3.6 01/07/2020 1228   CL 102 01/07/2020 1228   CO2 28 01/07/2020 1228   GLUCOSE 96 01/07/2020 1228   BUN 12 01/07/2020 1228   CREATININE 0.76 01/07/2020 1228   CREATININE 0.83 04/10/2019 1240   CALCIUM 9.4 01/07/2020 1228   PROT 6.9 10/24/2019 1323   ALBUMIN 3.6 10/24/2019 1323   AST 14 (L) 10/24/2019 1323   AST 17 04/10/2019 1240   ALT 9 10/24/2019 1323   ALT 19 04/10/2019 1240   ALKPHOS 62 10/24/2019 1323   BILITOT 0.4 10/24/2019 1323   BILITOT 0.4 04/10/2019 1240   GFRNONAA >60 01/07/2020 1228   GFRNONAA >60 04/10/2019 1240   GFRAA >60 10/24/2019 1323   GFRAA >60 04/10/2019 1240   No results found for: CHOL, HDL, LDLCALC, LDLDIRECT, TRIG, CHOLHDL No results found for: HGBA1C Lab Results  Component Value Date   VITAMINB12 256 01/15/2019   No results found for: TSH   ASSESSMENT AND PLAN  54 y.o. year old female  has a past medical history of Allergic rhinitis, Anemia, Anemia, Anxiety, Anxiety, Arthritis, Breast cancer ([place]), Cancer ([place]) (0569), Complication of anesthesia, Esophageal stricture, Family history of breast cancer, GERD (gastroesophageal reflux disease), Graves disease (01/13/11), Headache, Hiatal hernia, Hypertension, Hyperthyroidism, Hypothyroidism, Murmur, Obesity, Personal history of chemotherapy, Personal history of radiation therapy, Pneumonia (2017), Sleep apnea (2021), Tendonitis of shoulder, right, Vitamin D deficiency, and Vitamin D deficiency. here with     ICD-10-CM   1. OSA on CPAP  G47.33    Z99.89   2. Cognitive complaints  R41.9 Ambulatory referral to Neuropsychology     Armoni Kludt Trigueros is doing well on CPAP therapy. Compliance report reveals  excellent compliance. She was encouraged to continue using CPAP nightly and for greater than 4 hours each night. We will update supply orders as indicated. Risks of untreated sleep apnea review and education materials provided. Referral placed for neurocognitive evaluation. Healthy lifestyle habits encouraged. She will follow up in 1 year, sooner if needed. She verbalizes understanding and agreement with this plan.   Orders Placed This Encounter  Procedures  . Ambulatory referral to Neuropsychology    Referral Priority:   Routine    Referral Type:   Psychiatric    Referral Reason:   Specialty Services Required    Requested Specialty:   Psychology    Number of Visits Requested:   1     No orders of the defined types were placed in this encounter.     I spent 15 minutes with the patient. 50% of this time was spent counseling and educating patient on plan of care and medications.    Debbora Presto, FNP-C 01/22/2020, 9:26 AM [facility] [address], [place], [place] [address] [phone]   I reviewed the above note and documentation by the Nurse Practitioner and agree with the history, exam, assessment and plan as outlined above. I was available for consultation. Star Age, MD, PhD  [facility])

## 2020-01-22 NOTE — Patient Instructions (Addendum)
Please continue using your CPAP regularly. While your insurance requires that you use CPAP at least 4 hours each night on 70% of the nights, I recommend, that you not skip any nights and use it throughout the night if you can. Getting used to CPAP and staying with the treatment long term does take time and patience and discipline. Untreated obstructive sleep apnea when it is moderate to severe can have an adverse impact on cardiovascular health and raise her risk for heart disease, arrhythmias, hypertension, congestive heart failure, stroke and diabetes. Untreated obstructive sleep apnea causes sleep disruption, nonrestorative sleep, and sleep deprivation. This can have an impact on your day to day functioning and cause daytime sleepiness and impairment of cognitive function, memory loss, mood disturbance, and problems focussing. Using CPAP regularly can improve these symptoms.   Continue close follow up with PCP and oncology. Consider rescheduling visit with neuropsychology for cognitive evaluation if memory concerns worsen. Stay well hydrated and follow up with [place] for CPAP review in 1 year   Sleep Apnea Sleep apnea affects breathing during sleep. It causes breathing to stop for a short time or to become shallow. It can also increase the risk of:  Heart attack.  Stroke.  Being very overweight (obese).  Diabetes.  Heart failure.  Irregular heartbeat. The goal of treatment is to help you breathe normally again. What are the causes? There are three kinds of sleep apnea:  Obstructive sleep apnea. This is caused by a blocked or collapsed airway.  Central sleep apnea. This happens when the brain does not send the right signals to the muscles that control breathing.  Mixed sleep apnea. This is a combination of obstructive and central sleep apnea. The most common cause of this condition is a collapsed or blocked airway. This can happen if:  Your throat muscles are too relaxed.  Your tongue  and tonsils are too large.  You are overweight.  Your airway is too small. What increases the risk?  Being overweight.  Smoking.  Having a small airway.  Being older.  Being female.  Drinking alcohol.  Taking medicines to calm yourself (sedatives or tranquilizers).  Having family members with the condition. What are the signs or symptoms?  Trouble staying asleep.  Being sleepy or tired during the day.  Getting angry a lot.  Loud snoring.  Headaches in the morning.  Not being able to focus your mind (concentrate).  Forgetting things.  Less interest in sex.  Mood swings.  Personality changes.  Feelings of sadness (depression).  Waking up a lot during the night to pee (urinate).  Dry mouth.  Sore throat. How is this diagnosed?  Your medical history.  A physical exam.  A test that is done when you are sleeping (sleep study). The test is most often done in a sleep lab but may also be done at home. How is this treated?   Sleeping on your side.  Using a medicine to get rid of mucus in your nose (decongestant).  Avoiding the use of alcohol, medicines to help you relax, or certain pain medicines (narcotics).  Losing weight, if needed.  Changing your diet.  Not smoking.  Using a machine to open your airway while you sleep, such as: ? An oral appliance. This is a mouthpiece that shifts your lower jaw forward. ? A CPAP device. This device blows air through a mask when you breathe out (exhale). ? An EPAP device. This has valves that you put in each nostril. ?  A BPAP device. This device blows air through a mask when you breathe in (inhale) and breathe out.  Having surgery if other treatments do not work. It is important to get treatment for sleep apnea. Without treatment, it can lead to:  High blood pressure.  Coronary artery disease.  In men, not being able to have an erection (impotence).  Reduced thinking ability. Follow these instructions  at home: Lifestyle  Make changes that your doctor recommends.  Eat a healthy diet.  Lose weight if needed.  Avoid alcohol, medicines to help you relax, and some pain medicines.  Do not use any products that contain nicotine or tobacco, such as cigarettes, e-cigarettes, and chewing tobacco. If you need help quitting, ask your doctor. General instructions  Take over-the-counter and prescription medicines only as told by your doctor.  If you were given a machine to use while you sleep, use it only as told by your doctor.  If you are having surgery, make sure to tell your doctor you have sleep apnea. You may need to bring your device with you.  Keep all follow-up visits as told by your doctor. This is important. Contact a doctor if:  The machine that you were given to use during sleep bothers you or does not seem to be working.  You do not get better.  You get worse. Get help right away if:  Your chest hurts.  You have trouble breathing in enough air.  You have an uncomfortable feeling in your back, arms, or stomach.  You have trouble talking.  One side of your body feels weak.  A part of your face is hanging down. These symptoms may be an emergency. Do not wait to see if the symptoms will go away. Get medical help right away. Call your local emergency services (911 in the [place] Do not drive yourself to the hospital. Summary  This condition affects breathing during sleep.  The most common cause is a collapsed or blocked airway.  The goal of treatment is to help you breathe normally while you sleep. This information is not intended to replace advice given to you by your health care provider. Make sure you discuss any questions you have with your health care provider. Document Revised: 12/01/2017 Document Reviewed: 10/10/2017 Elsevier Patient Education  [place].

## 2020-01-28 ENCOUNTER — Encounter: Payer: Self-pay | Admitting: Psychology

## 2020-01-30 ENCOUNTER — Encounter: Payer: Self-pay | Admitting: *Deleted

## 2020-02-07 ENCOUNTER — Other Ambulatory Visit: Payer: Self-pay

## 2020-02-07 ENCOUNTER — Encounter (HOSPITAL_COMMUNITY)
Admission: RE | Admit: 2020-02-07 | Discharge: 2020-02-07 | Disposition: A | Discharge status: Home / Self Care | Payer: BC Managed Care – PPO | Source: Ambulatory Visit | Attending: Obstetrics and Gynecology | Admitting: Obstetrics and Gynecology

## 2020-02-07 ENCOUNTER — Encounter (HOSPITAL_COMMUNITY): Payer: Self-pay | Admitting: Obstetrics and Gynecology

## 2020-02-07 DIAGNOSIS — Z01812 Encounter for preprocedural laboratory examination: Secondary | ICD-10-CM | POA: Diagnosis present

## 2020-02-07 LAB — CBC
HCT: 36.9 % (ref 36.0–46.0)
Hemoglobin: 11.4 g/dL — ABNORMAL LOW (ref 12.0–15.0)
MCH: 25.9 pg — ABNORMAL LOW (ref 26.0–34.0)
MCHC: 30.9 g/dL (ref 30.0–36.0)
MCV: 83.7 fL (ref 80.0–100.0)
Platelets: 178 10*3/uL (ref 150–400)
RBC: 4.41 MIL/uL (ref 3.87–5.11)
RDW: 13.9 % (ref 11.5–15.5)
WBC: 4.7 10*3/uL (ref 4.0–10.5)
nRBC: 0 % (ref 0.0–0.2)

## 2020-02-07 LAB — BASIC METABOLIC PANEL
Anion gap: 10 (ref 5–15)
BUN: 14 mg/dL (ref 6–20)
CO2: 29 mmol/L (ref 22–32)
Calcium: 9.6 mg/dL (ref 8.9–10.3)
Chloride: 102 mmol/L (ref 98–111)
Creatinine, Ser: 0.71 mg/dL (ref 0.44–1.00)
GFR, Estimated: >60 mL/min (ref >60)
Glucose, Bld: 102 mg/dL — ABNORMAL HIGH (ref 70–99)
Potassium: 3.8 mmol/L (ref 3.5–5.1)
Sodium: 141 mmol/L (ref 135–145)

## 2020-02-07 NOTE — Progress Notes (Signed)
[id number] [address], [place] [address] [place] [address] Phone: [phone] Fax: [phone]  [place] [address], [address] [place] [address] Phone: [phone] Fax: [phone]      Your procedure is scheduled on Wednesday December 15th.  Report to [facility] Main Entrance "A" at 6:30 A.M., and check in at the Admitting office.  Call this number if you have problems the morning of surgery:  [phone]  Call [phone] if you have any questions prior to your surgery date Monday-Friday 8am-4pm    Remember:  Do not eat after midnight the night before your surgery  You may drink clear liquids until 5:30 AM the morning of your surgery.   Clear liquids allowed are: Water, Non-Citrus Juices (without pulp), Carbonated Beverages, Clear Tea, Black Coffee Only, and Gatorade    Take these medicines the morning of surgery with A SIP OF WATER   amLODipine (NORVASC) 5 MG tablet  anastrozole (ARIMIDEX) 1 MG tablet  citalopram (CELEXA) 40 MG tablet  levothyroxine (SYNTHROID) 175 MCG tablet   loratadine (CLARITIN) 10 MG tablet  metoprolol (TOPROL-XL) 200 MG 24 hr tablet  pantoprazole (PROTONIX) 40 MG tablet      As of today, STOP taking any Aspirin (unless otherwise instructed by your surgeon) Aleve, Naproxen, Ibuprofen, Motrin, Advil, Goody's, BC's, all herbal medications, fish oil, and all vitamins.                      Do not wear jewelry, make up, or nail polish            Do not wear lotions, powders, perfumes, or deodorant.            Do not shave 48 hours prior to surgery.             Do not bring valuables to the hospital.            [facility] is not responsible for any belongings or valuables.  Do NOT Smoke (Tobacco/Vaping) or drink Alcohol 24 hours prior to your procedure If you use a CPAP at night, you may bring all equipment for your overnight stay.   Contacts, glasses,  dentures or bridgework may not be worn into surgery.      For patients admitted to the hospital, discharge time will be determined by your treatment team.   Patients discharged the day of surgery will not be allowed to drive home, and someone needs to stay with them for 24 hours.    Special instructions:   [facility]- Preparing For Surgery  Before surgery, you can play an important role. Because skin is not sterile, your skin needs to be as free of germs as possible. You can reduce the number of germs on your skin by washing with CHG (chlorahexidine gluconate) Soap before surgery.  CHG is an antiseptic cleaner which kills germs and bonds with the skin to continue killing germs even after washing.    Oral Hygiene is also important to reduce your risk of infection.  Remember - BRUSH YOUR TEETH THE MORNING OF SURGERY WITH YOUR REGULAR TOOTHPASTE  Please do not use if you have an allergy to CHG or antibacterial soaps. If your skin becomes reddened/irritated stop using the CHG.  Do not shave (including legs and underarms) for at least 48 hours prior to first CHG shower. It is OK to shave your face.  Please  follow these instructions carefully.   1. Shower the NIGHT BEFORE SURGERY and the MORNING OF SURGERY with CHG Soap.   2. If you chose to wash your hair, wash your hair first as usual with your normal shampoo.  3. After you shampoo, rinse your hair and body thoroughly to remove the shampoo.  4. Use CHG as you would any other liquid soap. You can apply CHG directly to the skin and wash gently with a scrungie or a clean washcloth.   5. Apply the CHG Soap to your body ONLY FROM THE NECK DOWN.  Do not use on open wounds or open sores. Avoid contact with your eyes, ears, mouth and genitals (private parts). Wash Face and genitals (private parts)  with your normal soap.   6. Wash thoroughly, paying special attention to the area where your surgery will be performed.  7. Thoroughly rinse your  body with warm water from the neck down.  8. DO NOT shower/wash with your normal soap after using and rinsing off the CHG Soap.  9. Pat yourself dry with a CLEAN TOWEL.  10. Wear CLEAN PAJAMAS to bed the night before surgery  11. Place CLEAN SHEETS on your bed the night of your first shower and DO NOT SLEEP WITH PETS.   Day of Surgery: Wear Clean/Comfortable clothing the morning of surgery Do not apply any deodorants/lotions.   Remember to brush your teeth WITH YOUR REGULAR TOOTHPASTE.   Please read over the following fact sheets that you were given.

## 2020-02-07 NOTE — Progress Notes (Signed)
PCP - Dr. [name] Cardiologist - Denies  Chest x-ray - Not indicated EKG - 04/16/19 Stress Test - Yes "It has been a while ago" r/t B/P ECHO - 02/18/14 Cardiac Cath - Denies  Sleep Study - Yes has OSA CPAP - Yes nightly  DM- Denies  COVID TEST- 02/10/20  Anesthesia review: No  Patient denies shortness of breath, fever, cough and chest pain at PAT appointment   All instructions explained to the patient, with a verbal understanding of the material. Patient agrees to go over the instructions while at home for a better understanding. Patient also instructed to self quarantine after being tested for COVID-19. The opportunity to ask questions was provided.

## 2020-02-10 ENCOUNTER — Other Ambulatory Visit (HOSPITAL_COMMUNITY)
Admission: RE | Admit: 2020-02-10 | Discharge: 2020-02-10 | Disposition: A | Discharge status: Home / Self Care | Payer: BC Managed Care – PPO | Source: Ambulatory Visit | Attending: Obstetrics and Gynecology | Admitting: Obstetrics and Gynecology

## 2020-02-10 DIAGNOSIS — Z01812 Encounter for preprocedural laboratory examination: Secondary | ICD-10-CM | POA: Insufficient documentation

## 2020-02-10 DIAGNOSIS — Z20822 Contact with and (suspected) exposure to covid-19: Secondary | ICD-10-CM | POA: Insufficient documentation

## 2020-02-10 LAB — SARS CORONAVIRUS 2 (TAT 6-24 HRS): SARS Coronavirus 2: NEGATIVE

## 2020-02-11 ENCOUNTER — Other Ambulatory Visit: Payer: Self-pay | Admitting: Obstetrics and Gynecology

## 2020-02-11 NOTE — Anesthesia Preprocedure Evaluation (Addendum)
Anesthesia Evaluation  Patient identified by MRN, date of birth, ID band Patient awake    Reviewed: Allergy & Precautions, NPO status , Patient's Chart, lab work & pertinent test results  History of Anesthesia Complications Negative for: history of anesthetic complications  Airway Mallampati: I  TM Distance: >3 FB Neck ROM: Full    Dental  (+) Teeth Intact   Pulmonary sleep apnea and Continuous Positive Airway Pressure Ventilation ,    Pulmonary exam normal        Cardiovascular hypertension, Pt. on medications and Pt. on home beta blockers Normal cardiovascular exam     Neuro/Psych Anxiety negative neurological ROS     GI/Hepatic Neg liver ROS, hiatal hernia, GERD  ,  Endo/Other  Hypothyroidism Morbid obesity  Renal/GU negative Renal ROS  negative genitourinary   Musculoskeletal  (+) Arthritis ,   Abdominal   Peds  Hematology  (+) anemia , Hgb 11.4   Anesthesia Other Findings  OSA/CPAP, HTN, GERD, h/o Graves disease s/p radioactive iodine- now hypothyroid, h/o breast cancer s/p chemo/XRT  Echo 2015: EF 55-60%, no wall motion abnormalities, normal valves  Reproductive/Obstetrics                           Anesthesia Physical Anesthesia Plan  ASA: III  Anesthesia Plan: General   Post-op Pain Management:    Induction: Intravenous  PONV Risk Score and Plan: 3 and Ondansetron, Dexamethasone, Treatment may vary due to age or medical condition and Midazolam  Airway Management Planned: Oral ETT  Additional Equipment: None  Intra-op Plan:   Post-operative Plan: Extubation in OR  Informed Consent: I have reviewed the patients History and Physical, chart, labs and discussed the procedure including the risks, benefits and alternatives for the proposed anesthesia with the patient or authorized representative who has indicated his/her understanding and acceptance.     Dental advisory  given  Plan Discussed with:   Anesthesia Plan Comments:        Anesthesia Quick Evaluation

## 2020-02-11 NOTE — H&P (Deleted)
  The note originally documented on this encounter has been moved the the encounter in which it belongs.  

## 2020-02-11 NOTE — H&P (Signed)
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  Does patient report new onset of COVID symptoms?  No.  Has patient or close contact tested positive for COVID-19?  No , not in the past 2 weeks.         General          54 yo presents to discuss pelvic U/S results for evaluation of genetic predisposition for ovarian cancer.            H/o TAH. She still has ovaries.            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             U/S performed today, Sept 28, 2021, revealed bilat OV WNL. No pelvic or adnexal masses seen.      Current Medication:      Taking   Citalopram Hydrobromide 40 MG Tablet TAKE 1 TABLET BY MOUTH EVERY DAY.      Metoprolol Succinate ER 200 MG Tablet Extended Release 24 Hour TAKE 1 TABLET BY MOUTH EVERY DAY.      Pantoprazole Sodium 40 MG Tablet Delayed Release 1 tablet Orally twice a day.      amLODIPine Besylate 5 MG Tablet TAKE 1 TABLET BY MOUTH EVERY DAY.      Synthroid(L-Thyroxine Sodium) 175 MCG Tablet daw-1, 1 tablet on an empty stomach in the morning Orally Once a day.      Edarbyclor(Azilsartan-Chlorthalidone) 40-25 MG Tablet 1 tablet Orally Once a day.      Cerefolin 07-29-48-5 MG Tablet 1 tablet Orally Once a day.      Claritin(Loratadine) 10 MG Tablet Orally Once a day- as needed, Notes: as needed.      Vitamin D 4000 units Capsule 1 capsule Orally once a day.      Medication List reviewed and reconciled with the patient.      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   Periods : hysterectomy.   LMP 01/10/2017.   Birth control none.   Last pap smear date 11/01/2016.   Last mammogram date november 2021 Left breast.   Denies Abnormal  pap smear.   Denies STD.   Menarche 38.   GYN procedures hysterectomy.        OB History:   Number of pregnancies  2.   Pregnancy # 1  miscarriage ~ 10 wks (D & C).   Pregnancy # 2  11/10/97, live birth, C-section, twin girls.        Surgical History:   C-section 11/10/97      Dilatation of esophageal stricture 06/2009      D & C (SAB) at the age of 55      TAH with bilateral salpingectomy, Dr. Landry Mellow, fibroids 12/18      torn meniscus on the right knee in her late 40's      Left radioactive seed localized lumpectomy/blue dye injection/left axillary sentinel lymph node bx, Dr Georgette Dover 2/21      port-a-cath placement, Dr Georgette Dover, removed 11/21 3/21      port-a-cath removal 12/2019       Hospitalization:   c-section (twins)      Not in past year 07/2019       Family History:   Father: deceased, alcohol abuse    Mother: alive, Hypertension. Hypothyroidism., diagnosed with Hypertension    Paternal Grand Father: deceased    Paternal Grand Mother: deceased, Breast cancer.    Maternal Grand Father: deceased, Coronary artery disease.    Maternal Grand Mother: deceased    Brother 1: deceased, Died of HIV.    Brother2:  deceased 58 yrs, died of sudden MI vs PE--collapsed after finishing his daily run, had been complaining of his leg hurting    Sister 1: alive, Breast cancer., diagnosed with Breast cancer    Sister 2: alive, Healthy.    Daughter(s): diabetes    2 brother(s) , 2 sister(s) . 2 daughter(s) .          GI: negative for colon cancer, colon polyps and liver disease         Mother-hypothyroidism.     Social History:       General         Tobacco use cigarettes:  Never smoked, Tobacco history last updated  11/26/2019, Vaping  No.           no Alcohol, None.           Caffeine: yes, coffee, once a week.           no Recreational drug use, None.           Marital Status: Divorced 5/15 She lives with her 2 daughters.           Children: Gaylene Brooks (type I DM) (twins, Dad in [place]).           EDUCATION: Masters degree in education.           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           Religion: Love and Faith.      ROS:       CONSTITUTIONAL         Chills  No.  Fatigue  No.  Fever  No.  Night sweats  No.  Recent travel outside [place]  No.  Sweats  No.  Weight change  No.         OPHTHALMOLOGY         Blurring of vision  no.  Change in vision  no.  Double vision  no.         ENT  Dizziness  no.  Nose bleeds  no.  Sore throat  no.  Teeth pain  no.         ALLERGY         Hives  no.         CARDIOLOGY         Chest pain  no.  High blood pressure  no.  Irregular heart beat  no.  Leg edema  no.  Palpitations  no.         RESPIRATORY         Shortness of breath  no.  Cough  no.  Wheezing  no.         UROLOGY         Pain with urination  no.  Urinary urgency  no.  Urinary frequency  no.  Urinary incontinence  no.  Difficulty urinating  No.  Blood in urine  No.         GASTROENTEROLOGY         Abdominal pain  no.  Appetite change  no.  Bloating/belching  no.  Blood in stool or on toilet paper  no.  Change in bowel movements  no.   Constipation  no.  Diarrhea  no.  Difficulty swallowing  no.  Nausea  no.         FEMALE REPRODUCTIVE         Vulvar pain  no.  Vulvar rash  no.  Abnormal vaginal bleeding  no.  Breast pain  no.  Nipple discharge  no.  Pain with intercourse  no.  Pelvic pain  no.  Unusual vaginal discharge  no.  Vaginal itching  no.         MUSCULOSKELETAL         Muscle aches  no.         NEUROLOGY         Headache  no.  Tingling/numbness  no.  Weakness  no.         PSYCHOLOGY         Depression  no.  Anxiety  no.  Nervousness  no.  Sleep disturbances  no.  Suicidal ideation  no .         ENDOCRINOLOGY         Excessive thirst  no.  Excessive urination  no.  Hair loss  no.  Heat or cold intolerance  no.         HEMATOLOGY/LYMPH         Abnormal bleeding  no.  Easy bruising  no.  Swollen glands  no.         DERMATOLOGY         New/changing skin lesion  no.  Rash  no.  Sores  no.            Negative except as stated in HPI.   Objective:    Vitals:        Wt 300, Wt change -6 lb, Ht 64.75, BMI 50.30, Pulse sitting 65, BP sitting 140/90.     Past Results:    Examination:          General Examination         CONSTITUTIONAL: alert, oriented, NAD .          SKIN:  moist, warm.          EYES:  Conjunctiva clear.          LUNGS:  good I:E efffort noted, clear to auscultation  bilaterally.          HEART:  regular rate and rhythm.          ABDOMEN: soft, non-tender/non-distended, bowel sounds present .          FEMALE GENITOURINARY: normal external genitalia, labia - unremarkable, vagina - pink moist mucosa, no lesions or abnormal discharge, cervix -surgically absent adnexa - no masses or tenderness, uterus -surgically absent .          EXTREMITIES:  no edema present.          PSYCH:  affect normal, good eye contact.      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 pt voiced understanding and desires to proceed.

## 2020-02-12 ENCOUNTER — Encounter (HOSPITAL_COMMUNITY): Payer: Self-pay | Admitting: Obstetrics and Gynecology

## 2020-02-12 ENCOUNTER — Ambulatory Visit (HOSPITAL_COMMUNITY): Payer: BC Managed Care – PPO | Admitting: Certified Registered"

## 2020-02-12 ENCOUNTER — Encounter (HOSPITAL_COMMUNITY)
Admission: AD | Disposition: A | Discharge status: Home / Self Care | Payer: Self-pay | Source: Ambulatory Visit | Attending: Obstetrics and Gynecology

## 2020-02-12 ENCOUNTER — Other Ambulatory Visit: Payer: Self-pay

## 2020-02-12 ENCOUNTER — Inpatient Hospital Stay (HOSPITAL_COMMUNITY)
Admission: AD | Admit: 2020-02-12 | Discharge: 2020-02-13 | DRG: 742 | Disposition: A | Discharge status: Home / Self Care | Payer: BC Managed Care – PPO | Source: Ambulatory Visit | Attending: Obstetrics and Gynecology | Admitting: Obstetrics and Gynecology

## 2020-02-12 DIAGNOSIS — Z803 Family history of malignant neoplasm of breast: Secondary | ICD-10-CM | POA: Diagnosis not present

## 2020-02-12 DIAGNOSIS — E559 Vitamin D deficiency, unspecified: Secondary | ICD-10-CM | POA: Diagnosis present

## 2020-02-12 DIAGNOSIS — Z833 Family history of diabetes mellitus: Secondary | ICD-10-CM

## 2020-02-12 DIAGNOSIS — E669 Obesity, unspecified: Secondary | ICD-10-CM | POA: Diagnosis present

## 2020-02-12 DIAGNOSIS — Z6841 Body Mass Index (BMI) 40.0 and over, adult: Secondary | ICD-10-CM

## 2020-02-12 DIAGNOSIS — F419 Anxiety disorder, unspecified: Secondary | ICD-10-CM | POA: Diagnosis present

## 2020-02-12 DIAGNOSIS — G4730 Sleep apnea, unspecified: Secondary | ICD-10-CM | POA: Diagnosis present

## 2020-02-12 DIAGNOSIS — J309 Allergic rhinitis, unspecified: Secondary | ICD-10-CM | POA: Diagnosis present

## 2020-02-12 DIAGNOSIS — Z853 Personal history of malignant neoplasm of breast: Secondary | ICD-10-CM | POA: Diagnosis not present

## 2020-02-12 DIAGNOSIS — Z1502 Genetic susceptibility to malignant neoplasm of ovary: Secondary | ICD-10-CM | POA: Diagnosis present

## 2020-02-12 DIAGNOSIS — N736 Female pelvic peritoneal adhesions (postinfective): Secondary | ICD-10-CM | POA: Diagnosis present

## 2020-02-12 DIAGNOSIS — Z20822 Contact with and (suspected) exposure to covid-19: Secondary | ICD-10-CM | POA: Diagnosis present

## 2020-02-12 DIAGNOSIS — Z79890 Hormone replacement therapy (postmenopausal): Secondary | ICD-10-CM

## 2020-02-12 DIAGNOSIS — Z79899 Other long term (current) drug therapy: Secondary | ICD-10-CM

## 2020-02-12 DIAGNOSIS — I10 Essential (primary) hypertension: Secondary | ICD-10-CM | POA: Diagnosis present

## 2020-02-12 DIAGNOSIS — Z8249 Family history of ischemic heart disease and other diseases of the circulatory system: Secondary | ICD-10-CM | POA: Diagnosis not present

## 2020-02-12 DIAGNOSIS — Z23 Encounter for immunization: Secondary | ICD-10-CM

## 2020-02-12 DIAGNOSIS — Z9889 Other specified postprocedural states: Secondary | ICD-10-CM

## 2020-02-12 HISTORY — PX: LAPAROSCOPIC SALPINGO OOPHERECTOMY: SHX5927

## 2020-02-12 HISTORY — PX: LYSIS OF ADHESION: SHX5961

## 2020-02-12 HISTORY — PX: LAPAROTOMY: SHX154

## 2020-02-12 LAB — TYPE AND SCREEN
ABO/RH(D): O POS
Antibody Screen: NEGATIVE

## 2020-02-12 SURGERY — SALPINGO-OOPHORECTOMY, LAPAROSCOPIC
Anesthesia: General | Site: Abdomen

## 2020-02-12 MED ORDER — ONDANSETRON HCL 4 MG/2ML IJ SOLN
INTRAMUSCULAR | Status: AC
  Filled 2020-02-12: qty 2

## 2020-02-12 MED ORDER — DEXMEDETOMIDINE (PRECEDEX) IN NS 20 MCG/5ML (4 MCG/ML) IV SYRINGE
PREFILLED_SYRINGE | INTRAVENOUS | Status: AC
  Filled 2020-02-12: qty 5

## 2020-02-12 MED ORDER — EPINEPHRINE 1 MG/10ML IJ SOSY
PREFILLED_SYRINGE | INTRAMUSCULAR | Status: AC
  Filled 2020-02-12: qty 10

## 2020-02-12 MED ORDER — PHENYLEPHRINE HCL-NACL 10-0.9 MG/250ML-% IV SOLN
INTRAVENOUS | Status: DC | PRN
  Administered 2020-02-12: 20 ug/min via INTRAVENOUS

## 2020-02-12 MED ORDER — METOPROLOL SUCCINATE ER 100 MG PO TB24
200.0000 mg | ORAL_TABLET | Freq: Every day | ORAL | Status: DC
  Administered 2020-02-13: 200 mg via ORAL
  Filled 2020-02-12: qty 1
  Filled 2020-02-12: qty 2

## 2020-02-12 MED ORDER — PROPOFOL 10 MG/ML IV BOLUS
INTRAVENOUS | Status: DC | PRN
  Administered 2020-02-12: 200 mg via INTRAVENOUS
  Administered 2020-02-12: 30 mg via INTRAVENOUS

## 2020-02-12 MED ORDER — POTASSIUM CHLORIDE 20 MEQ PO PACK
40.0000 meq | PACK | Freq: Every day | ORAL | Status: DC
  Administered 2020-02-12 – 2020-02-13 (×2): 40 meq via ORAL
  Filled 2020-02-12 (×2): qty 2

## 2020-02-12 MED ORDER — EPHEDRINE SULFATE-NACL 50-0.9 MG/10ML-% IV SOSY
PREFILLED_SYRINGE | INTRAVENOUS | Status: DC | PRN
  Administered 2020-02-12: 5 mg via INTRAVENOUS

## 2020-02-12 MED ORDER — KETOROLAC TROMETHAMINE 30 MG/ML IJ SOLN
INTRAMUSCULAR | Status: AC
  Filled 2020-02-12: qty 1

## 2020-02-12 MED ORDER — MENTHOL 3 MG MT LOZG: 1.0000 | LOZENGE | OROMUCOSAL | Status: DC | PRN

## 2020-02-12 MED ORDER — LIDOCAINE 2% (20 MG/ML) 5 ML SYRINGE
INTRAMUSCULAR | Status: DC | PRN
  Administered 2020-02-12: 40 mg via INTRAVENOUS

## 2020-02-12 MED ORDER — IBUPROFEN 800 MG PO TABS
800.0000 mg | ORAL_TABLET | Freq: Three times a day (TID) | ORAL | Status: DC
  Administered 2020-02-12 – 2020-02-13 (×3): 800 mg via ORAL
  Filled 2020-02-12 (×3): qty 1

## 2020-02-12 MED ORDER — ACETAMINOPHEN 500 MG PO TABS
1000.0000 mg | ORAL_TABLET | Freq: Four times a day (QID) | ORAL | Status: DC
  Administered 2020-02-12 – 2020-02-13 (×3): 1000 mg via ORAL
  Filled 2020-02-12 (×3): qty 2

## 2020-02-12 MED ORDER — BUPIVACAINE HCL (PF) 0.25 % IJ SOLN
INTRAMUSCULAR | Status: AC
  Filled 2020-02-12: qty 30

## 2020-02-12 MED ORDER — EPHEDRINE 5 MG/ML INJ
INTRAVENOUS | Status: AC
  Filled 2020-02-12: qty 10

## 2020-02-12 MED ORDER — GLYCOPYRROLATE PF 0.2 MG/ML IJ SOSY
PREFILLED_SYRINGE | INTRAMUSCULAR | Status: AC
  Filled 2020-02-12: qty 1

## 2020-02-12 MED ORDER — LACTATED RINGERS IV SOLN: INTRAVENOUS | Status: DC

## 2020-02-12 MED ORDER — ONDANSETRON HCL 4 MG/2ML IJ SOLN: 4.0000 mg | Freq: Four times a day (QID) | INTRAMUSCULAR | Status: DC | PRN

## 2020-02-12 MED ORDER — DEXMEDETOMIDINE (PRECEDEX) IN NS 20 MCG/5ML (4 MCG/ML) IV SYRINGE
PREFILLED_SYRINGE | INTRAVENOUS | Status: DC | PRN
  Administered 2020-02-12 (×2): 4 ug via INTRAVENOUS
  Administered 2020-02-12: 8 ug via INTRAVENOUS
  Administered 2020-02-12: 4 ug via INTRAVENOUS

## 2020-02-12 MED ORDER — HYDROMORPHONE HCL 1 MG/ML IJ SOLN
0.2000 mg | INTRAMUSCULAR | Status: DC | PRN
  Administered 2020-02-12 (×3): 0.6 mg via INTRAVENOUS
  Filled 2020-02-12 (×3): qty 1

## 2020-02-12 MED ORDER — INFLUENZA VAC SPLIT QUAD 0.5 ML IM SUSY
0.5000 mL | PREFILLED_SYRINGE | INTRAMUSCULAR | Status: AC
  Administered 2020-02-13: 0.5 mL via INTRAMUSCULAR
  Filled 2020-02-12: qty 0.5

## 2020-02-12 MED ORDER — PROPOFOL 10 MG/ML IV BOLUS
INTRAVENOUS | Status: AC
  Filled 2020-02-12: qty 20

## 2020-02-12 MED ORDER — FENTANYL CITRATE (PF) 250 MCG/5ML IJ SOLN
INTRAMUSCULAR | Status: AC
  Filled 2020-02-12: qty 5

## 2020-02-12 MED ORDER — OXYCODONE HCL 5 MG PO TABS: 5.0000 mg | ORAL_TABLET | Freq: Once | ORAL | Status: DC | PRN

## 2020-02-12 MED ORDER — PHENYLEPHRINE 40 MCG/ML (10ML) SYRINGE FOR IV PUSH (FOR BLOOD PRESSURE SUPPORT)
PREFILLED_SYRINGE | INTRAVENOUS | Status: DC | PRN
  Administered 2020-02-12 (×2): 40 ug via INTRAVENOUS

## 2020-02-12 MED ORDER — DEXAMETHASONE SODIUM PHOSPHATE 10 MG/ML IJ SOLN
INTRAMUSCULAR | Status: AC
  Filled 2020-02-12: qty 1

## 2020-02-12 MED ORDER — ANASTROZOLE 1 MG PO TABS
1.0000 mg | ORAL_TABLET | Freq: Every day | ORAL | Status: DC
  Administered 2020-02-13: 1 mg via ORAL
  Filled 2020-02-12: qty 1

## 2020-02-12 MED ORDER — ROCURONIUM BROMIDE 10 MG/ML (PF) SYRINGE
PREFILLED_SYRINGE | INTRAVENOUS | Status: DC | PRN
  Administered 2020-02-12: 40 mg via INTRAVENOUS
  Administered 2020-02-12 (×2): 30 mg via INTRAVENOUS
  Administered 2020-02-12: 60 mg via INTRAVENOUS

## 2020-02-12 MED ORDER — KETOROLAC TROMETHAMINE 30 MG/ML IJ SOLN
30.0000 mg | Freq: Once | INTRAMUSCULAR | Status: AC
  Administered 2020-02-12: 30 mg via INTRAVENOUS

## 2020-02-12 MED ORDER — MIDAZOLAM HCL 5 MG/5ML IJ SOLN
INTRAMUSCULAR | Status: DC | PRN
  Administered 2020-02-12: 2 mg via INTRAVENOUS

## 2020-02-12 MED ORDER — PHENYLEPHRINE 40 MCG/ML (10ML) SYRINGE FOR IV PUSH (FOR BLOOD PRESSURE SUPPORT)
PREFILLED_SYRINGE | INTRAVENOUS | Status: AC
  Filled 2020-02-12: qty 10

## 2020-02-12 MED ORDER — OXYCODONE HCL 5 MG PO TABS: 5.0000 mg | ORAL_TABLET | ORAL | Status: DC | PRN

## 2020-02-12 MED ORDER — SENNA 8.6 MG PO TABS
1.0000 | ORAL_TABLET | Freq: Two times a day (BID) | ORAL | Status: DC
  Administered 2020-02-12 – 2020-02-13 (×3): 8.6 mg via ORAL
  Filled 2020-02-12 (×3): qty 1

## 2020-02-12 MED ORDER — LIDOCAINE 2% (20 MG/ML) 5 ML SYRINGE
INTRAMUSCULAR | Status: AC
  Filled 2020-02-12: qty 5

## 2020-02-12 MED ORDER — ROCURONIUM BROMIDE 10 MG/ML (PF) SYRINGE
PREFILLED_SYRINGE | INTRAVENOUS | Status: AC
  Filled 2020-02-12: qty 20

## 2020-02-12 MED ORDER — ZOLPIDEM TARTRATE 5 MG PO TABS: 5.0000 mg | ORAL_TABLET | Freq: Every evening | ORAL | Status: DC | PRN

## 2020-02-12 MED ORDER — ALUM & MAG HYDROXIDE-SIMETH 200-200-20 MG/5ML PO SUSP: 30.0000 mL | ORAL | Status: DC | PRN

## 2020-02-12 MED ORDER — MIDAZOLAM HCL 2 MG/2ML IJ SOLN
INTRAMUSCULAR | Status: AC
  Filled 2020-02-12: qty 2

## 2020-02-12 MED ORDER — ROCURONIUM BROMIDE 10 MG/ML (PF) SYRINGE
PREFILLED_SYRINGE | INTRAVENOUS | Status: AC
  Filled 2020-02-12: qty 10

## 2020-02-12 MED ORDER — POVIDONE-IODINE 10 % EX SWAB: 2.0000 "application " | Freq: Once | CUTANEOUS | Status: DC

## 2020-02-12 MED ORDER — FENTANYL CITRATE (PF) 100 MCG/2ML IJ SOLN: 25.0000 ug | INTRAMUSCULAR | Status: DC | PRN

## 2020-02-12 MED ORDER — ACETAMINOPHEN 500 MG PO TABS
1000.0000 mg | ORAL_TABLET | ORAL | Status: AC
  Administered 2020-02-12: 1000 mg via ORAL
  Filled 2020-02-12: qty 2

## 2020-02-12 MED ORDER — CHLORHEXIDINE GLUCONATE 0.12 % MT SOLN
15.0000 mL | Freq: Once | OROMUCOSAL | Status: AC
  Administered 2020-02-12: 15 mL via OROMUCOSAL
  Filled 2020-02-12: qty 15

## 2020-02-12 MED ORDER — SODIUM CHLORIDE 0.9 % IR SOLN
Status: DC | PRN
Start: 2020-02-12 — End: 2020-02-12
  Administered 2020-02-12: 3000 mL

## 2020-02-12 MED ORDER — GLYCOPYRROLATE PF 0.2 MG/ML IJ SOSY
PREFILLED_SYRINGE | INTRAMUSCULAR | Status: DC | PRN
  Administered 2020-02-12 (×2): .1 mg via INTRAVENOUS

## 2020-02-12 MED ORDER — SIMETHICONE 80 MG PO CHEW
80.0000 mg | CHEWABLE_TABLET | Freq: Four times a day (QID) | ORAL | Status: DC | PRN
  Administered 2020-02-13: 80 mg via ORAL
  Filled 2020-02-12: qty 1

## 2020-02-12 MED ORDER — SUGAMMADEX SODIUM 200 MG/2ML IV SOLN
INTRAVENOUS | Status: DC | PRN
  Administered 2020-02-12: 200 mg via INTRAVENOUS

## 2020-02-12 MED ORDER — AMLODIPINE BESYLATE 5 MG PO TABS
5.0000 mg | ORAL_TABLET | Freq: Every day | ORAL | Status: DC
  Administered 2020-02-13: 5 mg via ORAL
  Filled 2020-02-12 (×2): qty 1

## 2020-02-12 MED ORDER — ONDANSETRON HCL 4 MG/2ML IJ SOLN: 4.0000 mg | Freq: Once | INTRAMUSCULAR | Status: DC | PRN

## 2020-02-12 MED ORDER — OXYCODONE HCL 5 MG/5ML PO SOLN: 5.0000 mg | Freq: Once | ORAL | Status: DC | PRN

## 2020-02-12 MED ORDER — SODIUM CHLORIDE 0.9 % IV SOLN
2.0000 g | INTRAVENOUS | Status: AC
  Administered 2020-02-12: 2 g via INTRAVENOUS
  Filled 2020-02-12: qty 2

## 2020-02-12 MED ORDER — ONDANSETRON HCL 4 MG/2ML IJ SOLN
INTRAMUSCULAR | Status: DC | PRN
  Administered 2020-02-12: 4 mg via INTRAVENOUS

## 2020-02-12 MED ORDER — BUPIVACAINE HCL (PF) 0.25 % IJ SOLN
INTRAMUSCULAR | Status: DC | PRN
  Administered 2020-02-12: 20 mL

## 2020-02-12 MED ORDER — SUCCINYLCHOLINE CHLORIDE 200 MG/10ML IV SOSY
PREFILLED_SYRINGE | INTRAVENOUS | Status: AC
  Filled 2020-02-12: qty 10

## 2020-02-12 MED ORDER — PANTOPRAZOLE SODIUM 40 MG PO TBEC
40.0000 mg | DELAYED_RELEASE_TABLET | Freq: Two times a day (BID) | ORAL | Status: DC
Start: 2020-02-12 — End: 2020-02-13
  Administered 2020-02-12 – 2020-02-13 (×2): 40 mg via ORAL
  Filled 2020-02-12 (×2): qty 1

## 2020-02-12 MED ORDER — SUCCINYLCHOLINE CHLORIDE 200 MG/10ML IV SOSY
PREFILLED_SYRINGE | INTRAVENOUS | Status: DC | PRN
  Administered 2020-02-12: 140 mg via INTRAVENOUS

## 2020-02-12 MED ORDER — CITALOPRAM HYDROBROMIDE 40 MG PO TABS
40.0000 mg | ORAL_TABLET | Freq: Every day | ORAL | Status: DC
  Administered 2020-02-13: 40 mg via ORAL
  Filled 2020-02-12 (×2): qty 1

## 2020-02-12 MED ORDER — AMISULPRIDE (ANTIEMETIC) 5 MG/2ML IV SOLN: 10.0000 mg | Freq: Once | INTRAVENOUS | Status: DC | PRN

## 2020-02-12 MED ORDER — LEVOTHYROXINE SODIUM 175 MCG PO TABS
175.0000 ug | ORAL_TABLET | Freq: Every day | ORAL | Status: DC
  Administered 2020-02-13: 175 ug via ORAL
  Filled 2020-02-12: qty 1

## 2020-02-12 MED ORDER — ORAL CARE MOUTH RINSE: 15.0000 mL | Freq: Once | OROMUCOSAL | Status: AC

## 2020-02-12 MED ORDER — FENTANYL CITRATE (PF) 250 MCG/5ML IJ SOLN
INTRAMUSCULAR | Status: DC | PRN
  Administered 2020-02-12: 100 ug via INTRAVENOUS
  Administered 2020-02-12 (×2): 50 ug via INTRAVENOUS
  Administered 2020-02-12 (×2): 25 ug via INTRAVENOUS

## 2020-02-12 MED ORDER — ONDANSETRON HCL 4 MG PO TABS: 4.0000 mg | ORAL_TABLET | Freq: Four times a day (QID) | ORAL | Status: DC | PRN

## 2020-02-12 MED ORDER — DEXAMETHASONE SODIUM PHOSPHATE 10 MG/ML IJ SOLN
INTRAMUSCULAR | Status: DC | PRN
  Administered 2020-02-12: 10 mg via INTRAVENOUS

## 2020-02-12 SURGICAL SUPPLY — 49 items
APL SRG 38 LTWT LNG FL B (MISCELLANEOUS)
APPLICATOR ARISTA FLEXITIP XL (MISCELLANEOUS) IMPLANT
BAG SPEC RTRVL LRG 6X4 10 (ENDOMECHANICALS) ×2
BLADE 10 SAFETY STRL DISP (BLADE) ×3 IMPLANT
CABLE HIGH FREQUENCY MONO STRZ (ELECTRODE) ×3 IMPLANT
CELLS DAT CNTRL 66122 CELL SVR (MISCELLANEOUS) ×4 IMPLANT
DRAPE LAPAROTOMY TRNSV 102X78 (DRAPES) ×3 IMPLANT
DRSG OPSITE POSTOP 3X4 (GAUZE/BANDAGES/DRESSINGS) IMPLANT
DURAPREP 26ML APPLICATOR (WOUND CARE) ×6 IMPLANT
GLOVE BIOGEL M 6.5 STRL (GLOVE) ×6 IMPLANT
GLOVE BIOGEL PI IND STRL 6.5 (GLOVE) ×2 IMPLANT
GLOVE BIOGEL PI IND STRL 7.0 (GLOVE) ×4 IMPLANT
GLOVE BIOGEL PI INDICATOR 6.5 (GLOVE) ×1
GLOVE BIOGEL PI INDICATOR 7.0 (GLOVE) ×2
GOWN STRL REUS W/ TWL LRG LVL3 (GOWN DISPOSABLE) ×4 IMPLANT
GOWN STRL REUS W/TWL LRG LVL3 (GOWN DISPOSABLE) ×6
HEMOSTAT ARISTA ABSORB 3G PWDR (HEMOSTASIS) ×3 IMPLANT
KIT TURNOVER KIT B (KITS) ×3 IMPLANT
NEEDLE SPNL 18GX3.5 QUINCKE PK (NEEDLE) ×3 IMPLANT
NS IRRIG 1000ML POUR BTL (IV SOLUTION) ×3 IMPLANT
PACK LAPAROSCOPY BASIN (CUSTOM PROCEDURE TRAY) ×3 IMPLANT
PACK TRENDGUARD 450 HYBRID PRO (MISCELLANEOUS) ×2 IMPLANT
PENCIL BUTTON BLDE SNGL 10FT (ELECTRODE) ×3 IMPLANT
POUCH SPECIMEN RETRIEVAL 10MM (ENDOMECHANICALS) ×3 IMPLANT
PROTECTOR NERVE ULNAR (MISCELLANEOUS) ×6 IMPLANT
RTRCTR WOUND ALEXIS 18CM MED (MISCELLANEOUS) ×6
SET IRRIG TUBING LAPAROSCOPIC (IRRIGATION / IRRIGATOR) ×3 IMPLANT
SET TUBE SMOKE EVAC HIGH FLOW (TUBING) ×3 IMPLANT
SHEARS HARMONIC ACE PLUS 36CM (ENDOMECHANICALS) ×3 IMPLANT
SLEEVE ENDOPATH XCEL 5M (ENDOMECHANICALS) ×6 IMPLANT
SOLUTION ELECTROLUBE (MISCELLANEOUS) IMPLANT
SPONGE INTESTINAL PEANUT (DISPOSABLE) ×3 IMPLANT
SPONGE LAP 18X18 RF (DISPOSABLE) ×3 IMPLANT
SUT PDS AB 0 CT1 27 (SUTURE) ×3 IMPLANT
SUT VIC AB 0 CT1 18XCR BRD8 (SUTURE) ×2 IMPLANT
SUT VIC AB 0 CT1 8-18 (SUTURE) ×3
SUT VIC AB 4-0 KS 27 (SUTURE) ×3 IMPLANT
SUT VIC AB 4-0 PS2 27 (SUTURE) ×3 IMPLANT
SUT VICRYL 0 UR6 27IN ABS (SUTURE) IMPLANT
SUT VICRYL 4-0 PS2 18IN ABS (SUTURE) ×3 IMPLANT
TOWEL GREEN STERILE FF (TOWEL DISPOSABLE) ×6 IMPLANT
TRAY FOLEY W/BAG SLVR 14FR (SET/KITS/TRAYS/PACK) ×3 IMPLANT
TRENDGUARD 450 HYBRID PRO PACK (MISCELLANEOUS) ×3
TROCAR 12M 150ML BLUNT (TROCAR) ×3 IMPLANT
TROCAR XCEL NON-BLD 11X100MML (ENDOMECHANICALS) IMPLANT
TROCAR XCEL NON-BLD 5MMX100MML (ENDOMECHANICALS) ×3 IMPLANT
TUBING CONNECTING 10 (TUBING) ×3 IMPLANT
WARMER LAPAROSCOPE (MISCELLANEOUS) ×3 IMPLANT
YANKAUER SUCT BULB TIP NO VENT (SUCTIONS) ×3 IMPLANT

## 2020-02-12 NOTE — Op Note (Signed)
02/12/2020  11:31 AM  PATIENT:  Loretta Nelson  54 y.o. 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, pelvic adhesive disease  PROCEDURE:  Procedure(s): DIAGNOSTIC LAPAROSCOPY  (N/A) EXPLORATORY LAPAROTOMY WITH BILATERAL SALPINGO OOPHORECTOMY (Bilateral) LYSIS OF ADHESION (N/A)  SURGEON:  Surgeon(s) and Role:    Christophe Louis, MD - Primary  PHYSICIAN ASSISTANT: None  ASSISTANTS: Gaylord Shih RNFA   ANESTHESIA:   general  EBL:  30 mL   BLOOD ADMINISTERED:none  DRAINS: Urinary Catheter (Foley)   LOCAL MEDICATIONS USED:  MARCAINE     SPECIMEN:  Source of Specimen:  bilateral ovaries   DISPOSITION OF SPECIMEN:  PATHOLOGY  COUNTS:  YES  TOURNIQUET:  * No tourniquets in log *  DICTATION: .Dragon Dictation  PLAN OF CARE: Admit for overnight observation  PATIENT DISPOSITION:  PACU - hemodynamically stable.   Delay start of Pharmacological VTE agent (>24hrs) due to surgical blood loss or risk of bleeding: not applicable  Findings: Adhesions of the bowel to the anterior abdominal wall and the left ovary. . The fallopian tubes were surgically absent. The fallopian tubes appeared normal.   Procedure:The patient was taken to the operating room placed under general anesthesia. Time Out was performed.  She was  Prepped and draped in the normal sterile fashion. A foley catheter was placed. A sponge stick was placed into the vaginal vault.  Attention was turned to the abdomen where the umbilicus was injected with 10 cc of marcaine. A 10 mm trocar was placed under direct visualization. Pneumoperitoneum was achieved with C02 gas... A 5 mm trocar was placed in the right and left lower quadrants. Each trocar site was injected with 10 cc of marcaine prior to trocar placement. The harmonic scalpel was used transect  Adhesions around the right ovary. Due to the dense adhesions of  the bowel to the left ovary which could not be visualized due to adhesions I decided to convert to an open approach. The peritoneum was released. Trocars were removed. The fascia of the umbilical trocar was re approximated with 0 vicryl.  The skin was closed with 4-0 vicryl.   A pfannenstiel incision was made with scalpel and carried down to the fascia. The fascia was incised and extended laterally. The fascia was dissected from the rectus muscles and the rectus muscles were separated in the midline. The peritoneum was entered sharply. An alexis retractor was placed. The bowel was packed with moist laparotomy sponges. The Right ovary was grasped with the babcock clamp. Adhesions of the bowel were excised from the right ovary using sharp and blunt dissection. The infundibulo-pelvic ligament was clamped with Heaney clamp. The ovary was excised using mayo scissors. The IP ligament was ligated with a free tie followed by a suture ligature. Excellent hemostasis was observed.   Attention was turned to the left adnexa. The bowel adhesions were excised using sharp and blunt dissection. The ovary was identified and grasped with babcock clamp. . The infundibulo-pelvic ligament was clamped with Heaney clamp. The ovary was excised using mayo scissors. The IP ligament was ligated with a free tie followed by a suture ligature. Excellent hemostasis was observed.  Arista was placed along the IP bilaterally to ensure hemostasis.   All sponges and the alexis retractor were removed. The fascia was re approximated with 0 PDS. The subcutaneous layer was re approximated with  2-0 vicryl . The skin was closed with 4-0 vicryl. Derma  bond was placed over the skin incision.   Sponge , lap and needle counts were correct x 2.

## 2020-02-12 NOTE — Anesthesia Procedure Notes (Addendum)
Procedure Name: Intubation Date/Time: 02/12/2020 8:45 AM Performed by: Harden Mo, CRNA Pre-anesthesia Checklist: Patient identified, Emergency Drugs available, Suction available and Patient being monitored Patient Re-evaluated:Patient Re-evaluated prior to induction Oxygen Delivery Method: Circle System Utilized Preoxygenation: Pre-oxygenation with 100% oxygen Induction Type: IV induction Ventilation: Mask ventilation without difficulty Laryngoscope Size: Glidescope and 4 Grade View: Grade I Tube type: Oral Tube size: 7.5 mm Number of attempts: 2 Airway Equipment and Method: Oral airway,  Rigid stylet and Video-laryngoscopy Placement Confirmation: ETT inserted through vocal cords under direct vision,  positive ETCO2 and breath sounds checked- equal and bilateral Secured at: 23 cm Tube secured with: Tape Dental Injury: Teeth and Oropharynx as per pre-operative assessment  Difficulty Due To: Difficulty was anticipated Comments: Attempted DL with Mac 4 blade X1. Glidescope used for 2nd attempt by S. Sonia Baller.

## 2020-02-12 NOTE — Anesthesia Postprocedure Evaluation (Signed)
Anesthesia Post Note  Patient: Loretta Nelson  Procedure(s) Performed: DIAGNOSTIC LAPAROSCOPY  (N/A Abdomen) EXPLORATORY LAPAROTOMY WITH BILATERAL SALPINGO OOPHORECTOMY (Bilateral Abdomen) LYSIS OF ADHESION (N/A Abdomen)     Patient location during evaluation: PACU Anesthesia Type: General Level of consciousness: awake and alert Pain management: pain level controlled Vital Signs Assessment: post-procedure vital signs reviewed and stable Respiratory status: spontaneous breathing, nonlabored ventilation and respiratory function stable Cardiovascular status: blood pressure returned to baseline and stable Postop Assessment: no apparent nausea or vomiting Anesthetic complications: no   No complications documented.  Last Vitals:  Vitals:   02/12/20 1230 02/12/20 1245  BP: (!) 168/89 (!) 165/91  Pulse: 70 75  Resp: (!) 33 (!) 30  Temp:    SpO2: 99% 98%    Last Pain:  Vitals:   02/12/20 1245  TempSrc:   PainSc: [place]

## 2020-02-12 NOTE — Transfer of Care (Signed)
Immediate Anesthesia Transfer of Care Note  Patient: Loretta Nelson  Procedure(s) Performed: DIAGNOSTIC LAPAROSCOPY  (N/A Abdomen) EXPLORATORY LAPAROTOMY WITH BILATERAL SALPINGO OOPHORECTOMY (Bilateral Abdomen) LYSIS OF ADHESION (N/A Abdomen)  Patient Location: PACU  Anesthesia Type:General  Level of Consciousness: awake and drowsy  Airway & Oxygen Therapy: Patient Spontanous Breathing and Patient connected to face mask oxygen  Post-op Assessment: Report given to RN and Post -op Vital signs reviewed and stable  Post vital signs: Reviewed and stable  Last Vitals:  Vitals Value Taken Time  BP 160/93 02/12/20 1139  Temp    Pulse 70 02/12/20 1140  Resp 32 02/12/20 1140  SpO2 100 % 02/12/20 1140  Vitals shown include unvalidated device data.  Last Pain:  Vitals:   02/12/20 0742  TempSrc: Oral  PainSc: 0-No pain      Patients Stated Pain Goal: 3 (58/30/94 0768)  Complications: No complications documented.

## 2020-02-12 NOTE — H&P (Signed)
Date of Initial H&P:02/11/2020  History reviewed, patient examined, no change in status, stable for surgery.

## 2020-02-13 ENCOUNTER — Encounter (HOSPITAL_COMMUNITY): Payer: Self-pay | Admitting: Obstetrics and Gynecology

## 2020-02-13 DIAGNOSIS — N736 Female pelvic peritoneal adhesions (postinfective): Secondary | ICD-10-CM | POA: Diagnosis present

## 2020-02-13 DIAGNOSIS — Z1502 Genetic susceptibility to malignant neoplasm of ovary: Secondary | ICD-10-CM | POA: Diagnosis present

## 2020-02-13 LAB — CBC
HCT: 31.8 % — ABNORMAL LOW (ref 36.0–46.0)
Hemoglobin: 10.8 g/dL — ABNORMAL LOW (ref 12.0–15.0)
MCH: 27.3 pg (ref 26.0–34.0)
MCHC: 34 g/dL (ref 30.0–36.0)
MCV: 80.5 fL (ref 80.0–100.0)
Platelets: 213 10*3/uL (ref 150–400)
RBC: 3.95 MIL/uL (ref 3.87–5.11)
RDW: 14.1 % (ref 11.5–15.5)
WBC: 11.4 10*3/uL — ABNORMAL HIGH (ref 4.0–10.5)
nRBC: 0 % (ref 0.0–0.2)

## 2020-02-13 LAB — SURGICAL PATHOLOGY

## 2020-02-13 MED ORDER — ACETAMINOPHEN 500 MG PO TABS: 1000.0000 mg | ORAL_TABLET | Freq: Three times a day (TID) | ORAL | 0 refills | Status: AC | PRN

## 2020-02-13 MED ORDER — OXYCODONE HCL 5 MG PO TABS: 5.0000 mg | ORAL_TABLET | ORAL | 0 refills | Status: AC | PRN

## 2020-02-13 MED ORDER — IBUPROFEN 800 MG PO TABS: 800.0000 mg | ORAL_TABLET | Freq: Three times a day (TID) | ORAL | 0 refills | Status: DC | PRN

## 2020-02-13 NOTE — Discharge Summary (Signed)
Physician Discharge Summary  Patient ID: Loretta Nelson MRN: [id number] DOB/AGE: 1965-07-06 54 y.o.  Admit date: 02/12/2020 Discharge date: 02/13/2020  Admission Diagnoses: Genetic predisposition to ovarian cancer   Discharge Diagnoses:  Active Problems:   Status post laparotomy   Pelvic adhesive disease   Genetic predisposition to ovarian cancer   Discharged Condition: stable  Hospital Course: pt was admitted for observation after undergoing a diagnostic laparoscopy which was intended to be laparoscopic bilateral salpingoophorectomy. DUe to pelvic adhesive disease the surgery was converted to open approach using laparotomy via pfannensteihl incision. She did well postoperatively with return of bladder function and tolerating po.   Consults: None  Significant Diagnostic Studies: labs: hgb on pod #1 was 10.8  Treatments: surgery: diagnostic laparoscopy and laparotomy with bilateral oophorectomy   Discharge Exam: Blood pressure 124/63, pulse 68, temperature 98.8 F (37.1 C), temperature source Oral, resp. rate 18, height 5\' 4"  (1.626 m), weight 134.4 kg, last menstrual period 02/13/2017, SpO2 97 %. General appearance: alert, cooperative and no distress Resp: normal effort no distress  GI: soft appropriately tender nondistended  Extremities: extremities normal, atraumatic, no cyanosis or edema Incision/Wound: wound edges are well approximated no erythema or exudate   Disposition: Discharge disposition: 01-Home or Self Care       Discharge Instructions    Call MD for:  persistant nausea and vomiting   Complete by: As directed    Call MD for:  redness, tenderness, or signs of infection (pain, swelling, redness, odor or green/yellow discharge around incision site)   Complete by: As directed    Call MD for:  severe uncontrolled pain   Complete by: As directed    Call MD for:  temperature >100.4   Complete by: As directed    Diet - low sodium heart healthy   Complete  by: As directed    Driving Restrictions   Complete by: As directed    Avoid driving for 2 weeks   Increase activity slowly   Complete by: As directed    Lifting restrictions   Complete by: As directed    Avoid lifting over 10 lbs   May shower / Bathe   Complete by: As directed    May walk up steps   Complete by: As directed    No dressing needed   Complete by: As directed    Sexual Activity Restrictions   Complete by: As directed    Avoid sexual activity     Allergies as of 02/13/2020      Reactions   Tape Itching, Other (See Comments)   Adhesive tape--redness/inflammation/itching      Medication List    TAKE these medications   acetaminophen 500 MG tablet Commonly known as: TYLENOL Take 2 tablets (1,000 mg total) by mouth every 8 (eight) hours as needed.   amLODipine 5 MG tablet Commonly known as: NORVASC Take 5 mg by mouth daily.   anastrozole 1 MG tablet Commonly known as: ARIMIDEX Take 1 tablet (1 mg total) by mouth daily.   Cerefolin 07-29-48-5 MG Tabs Take 1 tablet by mouth every morning.   Cholecalciferol 100 MCG (4000 UT) Caps Take 4,000 Units by mouth daily.   citalopram 40 MG tablet Commonly known as: CELEXA Take 40 mg by mouth daily.   diclofenac Sodium 1 % Gel Commonly known as: VOLTAREN Apply 1 application topically 4 (four) times daily as needed (knee pain.).   Edarbyclor 40-25 MG Tabs Generic drug: Azilsartan-Chlorthalidone Take 1 tablet by mouth daily.  ibuprofen 800 MG tablet Commonly known as: ADVIL Take 1 tablet (800 mg total) by mouth every 8 (eight) hours as needed. What changed:   medication strength  how much to take  when to take this  reasons to take this   levothyroxine 175 MCG tablet Commonly known as: SYNTHROID Take 175 mcg by mouth daily before breakfast.   loratadine 10 MG tablet Commonly known as: CLARITIN TAKE 1 TABLET BY MOUTH EVERY DAY What changed:   when to take this  reasons to take this    metoprolol 200 MG 24 hr tablet Commonly known as: TOPROL-XL Take 200 mg by mouth daily.   oxyCODONE 5 MG immediate release tablet Commonly known as: Oxy IR/ROXICODONE Take 1-2 tablets (5-10 mg total) by mouth every 4 (four) hours as needed for up to 7 days for moderate pain or severe pain.   pantoprazole 40 MG tablet Commonly known as: PROTONIX Take 40 mg by mouth 2 (two) times daily.   potassium chloride 20 MEQ packet Commonly known as: KLOR-CON Take 40 mEq by mouth daily.            Discharge Care Instructions  (From admission, onward)         Start     Ordered   02/13/20 0000  No dressing needed        02/13/20 1118          Follow-up Information    Christophe Louis, MD. Go in 2 week(s).   Specialty: Obstetrics and Gynecology Why: Please keep your postoperative appointment with Dr. Junious Dresser information: [address] [place] [address] [phone]               Signed: Christophe Louis 02/13/2020, 11:21 AM

## 2020-02-18 ENCOUNTER — Other Ambulatory Visit: Payer: Self-pay | Admitting: *Deleted

## 2020-02-18 MED ORDER — CEREFOLIN 6-1-50-5 MG PO TABS: 1.0000 | ORAL_TABLET | Freq: Every morning | ORAL | 3 refills | Status: DC

## 2020-02-25 ENCOUNTER — Encounter: Payer: Self-pay | Admitting: Oncology

## 2020-02-27 ENCOUNTER — Encounter: Payer: Self-pay | Admitting: *Deleted

## 2020-03-03 ENCOUNTER — Ambulatory Visit (INDEPENDENT_AMBULATORY_CARE_PROVIDER_SITE_OTHER): Payer: BC Managed Care – PPO | Admitting: Psychology

## 2020-03-03 ENCOUNTER — Ambulatory Visit: Payer: Self-pay | Admitting: Psychology

## 2020-03-03 ENCOUNTER — Other Ambulatory Visit: Payer: Self-pay

## 2020-03-03 ENCOUNTER — Encounter: Payer: Self-pay | Admitting: Psychology

## 2020-03-03 DIAGNOSIS — C50412 Malignant neoplasm of upper-outer quadrant of left female breast: Secondary | ICD-10-CM | POA: Diagnosis not present

## 2020-03-03 DIAGNOSIS — Z170 Estrogen receptor positive status [ER+]: Secondary | ICD-10-CM | POA: Diagnosis not present

## 2020-03-03 DIAGNOSIS — E0500 Thyrotoxicosis with diffuse goiter without thyrotoxic crisis or storm: Secondary | ICD-10-CM | POA: Insufficient documentation

## 2020-03-03 DIAGNOSIS — G5710 Meralgia paresthetica, unspecified lower limb: Secondary | ICD-10-CM | POA: Insufficient documentation

## 2020-03-03 DIAGNOSIS — R413 Other amnesia: Secondary | ICD-10-CM | POA: Diagnosis not present

## 2020-03-03 DIAGNOSIS — E236 Other disorders of pituitary gland: Secondary | ICD-10-CM | POA: Insufficient documentation

## 2020-03-03 DIAGNOSIS — K219 Gastro-esophageal reflux disease without esophagitis: Secondary | ICD-10-CM | POA: Insufficient documentation

## 2020-03-03 DIAGNOSIS — R4189 Other symptoms and signs involving cognitive functions and awareness: Secondary | ICD-10-CM

## 2020-03-03 DIAGNOSIS — E890 Postprocedural hypothyroidism: Secondary | ICD-10-CM | POA: Insufficient documentation

## 2020-03-03 DIAGNOSIS — F411 Generalized anxiety disorder: Secondary | ICD-10-CM | POA: Diagnosis not present

## 2020-03-03 DIAGNOSIS — I10 Essential (primary) hypertension: Secondary | ICD-10-CM | POA: Insufficient documentation

## 2020-03-03 DIAGNOSIS — G4733 Obstructive sleep apnea (adult) (pediatric): Secondary | ICD-10-CM | POA: Insufficient documentation

## 2020-03-03 DIAGNOSIS — D509 Iron deficiency anemia, unspecified: Secondary | ICD-10-CM | POA: Insufficient documentation

## 2020-03-03 DIAGNOSIS — E559 Vitamin D deficiency, unspecified: Secondary | ICD-10-CM | POA: Insufficient documentation

## 2020-03-03 DIAGNOSIS — R1310 Dysphagia, unspecified: Secondary | ICD-10-CM | POA: Insufficient documentation

## 2020-03-03 NOTE — Progress Notes (Signed)
NEUROPSYCHOLOGICAL EVALUATION [place] [place] Department of Neurology  Date of Evaluation: March 03, 2020  Reason for Referral:   Loretta Nelson is a 55 y.o. right-handed African-American female referred by Debbora Presto, NP, to characterize her current cognitive functioning and assist with diagnostic clarity and treatment planning in the context of subjective cognitive decline.   Assessment and Plan:   Clinical Impression(s): Loretta Nelson pattern of performance is suggestive of isolated variability across both encoding (i.e., learning) and retrieval aspects of memory. Given variability, memory performances as a whole were still mildly below expectation given premorbid intellectual estimations. Retention rates were largely appropriate and she performed well on recognition trials, thus not suggestive of a memory storage deficit. Performance across all other assessed cognitive domains was quite strong. This included processing speed, attention/concentration, executive functioning, receptive and expressive language, and visuospatial abilities. Loretta Nelson denied difficulties completing instrumental activities of daily living (ADLs) independently.  The etiology is somewhat unclear at the present time. However, I do feel it likely that Loretta Nelson's history of chemotherapy and radiation treatment is directly influencing current cognitive performances. While the scientific literature is admittedly mixed, there are numerous studies which purport that cognitive dysfunction following this form of treatment can persist months to years following treatment cessation. Encoding/retrieval aspects of memory are commonly implicated as susceptible cognitive domains within these studies. Furthermore, while mixed, research also suggests that post-treatment oral chemotherapy medications (e.g., anastrozole/Arimidex) have cognitive side effects, including those which influence memory abilities.  Overall, her history of breast cancer treatment represents the most likely culprit for isolated memory weakness at the present time. This also fits Loretta Nelson's timeline as she reported a significant decline in cognitive abilities following said treatment. Ongoing psychiatric distress, while reportedly mild, can also exacerbate difficulties already present due to other etiologies. This is also true of other medical comorbidities such as Graves disease, obstructive sleep apnea, and vitamin D deficiency. Performance is not concerning for early-onset Alzheimer's disease or any other neurodegenerative condition at the present time. Continued medical monitoring will be important moving forward.   Recommendations: Should Loretta Nelson report significant cognitive or functional decline in the future, a repeat neuropsychological evaluation would be warranted at that time. The current evaluation will serve as an excellent baseline to compare future evaluations against. There is no need for a repeat evaluation at a specific time interval based upon current test scores.   Updated neuroimaging (i.e., brain MRI) could be considered given that previous scans were completed in 2011 (MRI) and 2016 (CT), both prior to her completing chemotherapy and radiation treatment. She should discuss the pros and cons of this with Loretta Nelson.   A combination of medication and psychotherapy has been shown to be most effective at treating symptoms of anxiety and depression. As such, Loretta Nelson is encouraged to speak with her prescribing physician regarding medication adjustments to optimally manage these symptoms. Likewise, Loretta Nelson is encouraged to consider engaging in short-term psychotherapy to address symptoms of psychiatric distress. She would benefit from an active and collaborative therapeutic environment, rather than one purely supportive in nature. Recommended treatment modalities include Cognitive Behavioral Therapy (CBT) or  Acceptance and Commitment Therapy (ACT).  Loretta Nelson is encouraged to attend to lifestyle factors for brain health (e.g., regular physical exercise, good nutrition habits, regular participation in cognitively-stimulating activities, and general stress management techniques), which are likely to have benefits for both emotional adjustment and cognition. In fact, in addition to promoting good general health,  regular exercise incorporating aerobic activities (e.g., brisk walking, jogging, cycling, etc.) has been demonstrated to be a very effective treatment for depression and stress, with similar efficacy rates to both antidepressant medication and psychotherapy. Optimal control of vascular risk factors (including safe cardiovascular exercise and adherence to dietary recommendations) is encouraged. Likewise, continued compliance with her CPAP machine will also be important.  If interested, there are some activities which have therapeutic value and can be useful in keeping her cognitively stimulated. For suggestions, Loretta Nelson is encouraged to go to the following website: https://www.[facility].org/get-to-know-barrow/centers-programs/neurorehabilitation-center/neuro-rehab-apps-and-games/ which has options, categorized by level of difficulty. It should be noted that these activities should not be viewed as a substitute for therapy.  When learning new information, she would benefit from information being broken up into small, manageable pieces. She may also find it helpful to articulate the material in her own words and in a context to promote encoding at the onset of a new task. This material may need to be repeated multiple times to promote encoding.  Memory can be improved using internal strategies such as rehearsal, repetition, chunking, mnemonics, association, and imagery. External strategies such as written notes in a consistently used memory journal, visual and nonverbal auditory cues such as a calendar  on the refrigerator or appointments with alarm, such as on a cell phone, can also help maximize recall.    To address problems with fluctuating attention, she may wish to consider:   -Avoiding external distractions when needing to concentrate   -Limiting exposure to fast paced environments with multiple sensory demands   -Writing down complicated information and using checklists   -Attempting and completing one task at a time (i.e., no multi-tasking)   -Verbalizing aloud each step of a task to maintain focus   -Reducing the amount of information considered at one time  Review of Records:   Loretta Nelson was seen by [facility] Debbora Presto, NP) on 01/22/2020 for follow-up of obstructive sleep apnea and ongoing CPAP treatment. Loretta Nelson reported using her CPAP nightly and did note improved sleep quality when using this device. Compliance report dated 12/22/2019 through 01/20/2020 revealed that she used her CPAP machine 30 of the past 30 days for a compliance score of 100%. Average usage was 7 hours and 58 minutes. Residual AHI was 1 on a set pressure of 12 cm of water and EPR 3. There was no significant leak noted. She did report intermittent insomnia since starting Arimidex. In addition to discussing sleep concerns, Loretta Nelson requested a new referral for a neurocognitive evaluation due to reported difficulties with multitasking, attention/concentration, and word finding. ADLs were described as intact. Performance on a brief cognitive screening instrument (MMSE) was 30/30. Ultimately, Loretta Nelson was referred for a comprehensive neuropsychological evaluation to characterize her cognitive abilities and to assist with diagnostic clarity and treatment planning.   Loretta Nelson was admitted to the hospital for observation after undergoing a diagnostic laparoscopy which was intended to be laparoscopic bilateral salpingoophorectomy. Due to pelvic adhesive disease, the surgery was converted to an  open approach using laparotomy via pfannenstiel incision. She did well postoperatively with return of bladder function and was discharged home in stable condition on 02/13/2020.   Brain MRI on 07/31/2009 was unremarkable outside of borderline findings suggesting a mild Chiari malformation. Head CT on 11/10/2014 in the context of a fall and potential head injury was negative.   Past Medical History:  Diagnosis Date  . Allergic rhinitis   . BARD1 gene mutation positive  04/19/2019  . Complication of anesthesia    woke up during EGD  . Dysphagia   . Empty sella syndrome   . Essential hypertension   . Family history of breast cancer   . Fibroids, intramural 02/17/2017  . Gastroesophageal reflux disease   . Generalized anxiety disorder   . Graves disease 01/13/11   radioactive iodine treatment, 02.5 millicuries  . Hiatal hernia    small  . History of chemotherapy   . History of hysterectomy 02/17/2017  . History of migraine headaches   . History of radiation therapy   . Iron deficiency anemia   . 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 Meralgia paresthetica   . Murmur    benign, h/o, caused by hyperdynamic contraction  . Obesity   . Obstructive sleep apnea   . Pneumonia 2017   inhaler prescribed  . Postoperative hypothyroidism   . 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 Tendonitis of shoulder, right   . Vitamin D deficiency     Past Surgical History:  Procedure Laterality Date  . ABDOMINAL HYSTERECTOMY  2018  . BALLOON DILATION N/A 09/07/2016   Procedure: BALLOON DILATION;  Surgeon: Arta Silence, MD;  Location: [facility] ENDOSCOPY;  Service: Endoscopy;  Laterality: N/A;  . BREAST LUMPECTOMY Left 03/2019  . BREAST LUMPECTOMY WITH RADIOACTIVE SEED AND SENTINEL LYMPH NODE BIOPSY Left 04/17/2019   Procedure: LEFT BREAST LUMPECTOMY WITH RADIOACTIVE SEED AND SENTINEL LYMPH NODE BIOPSY;  Surgeon: Donnie Mesa, MD;   Location: [place];  Service: General;  Laterality: Left;  LMA, PEC BLOCK  . CESAREAN SECTION  1999  . COLONOSCOPY    . DILATION AND CURETTAGE OF UTERUS    . ESOPHAGOGASTRODUODENOSCOPY    . ESOPHAGOGASTRODUODENOSCOPY (EGD) WITH PROPOFOL N/A 09/07/2016   Procedure: ESOPHAGOGASTRODUODENOSCOPY (EGD) WITH PROPOFOL;  Surgeon: Arta Silence, MD;  Location: [place];  Service: Endoscopy;  Laterality: N/A;  . HYSTERECTOMY ABDOMINAL WITH SALPINGECTOMY Bilateral 02/17/2017   Procedure: HYSTERECTOMY ABDOMINAL WITH SALPINGECTOMY;  Surgeon: Christophe Louis, MD;  Location: [facility];  Service: Gynecology;  Laterality: Bilateral;  . KNEE ARTHROSCOPY     for torn meniscus  . LAPAROSCOPIC SALPINGO OOPHERECTOMY N/A 02/12/2020   Procedure: DIAGNOSTIC LAPAROSCOPY ;  Surgeon: Christophe Louis, MD;  Location: [place];  Service: Gynecology;  Laterality: N/A;  . LAPAROTOMY Bilateral 02/12/2020   Procedure: EXPLORATORY LAPAROTOMY WITH BILATERAL SALPINGO OOPHORECTOMY;  Surgeon: Christophe Louis, MD;  Location: [place];  Service: Gynecology;  Laterality: Bilateral;  . LYSIS OF ADHESION N/A 02/12/2020   Procedure: LYSIS OF ADHESION;  Surgeon: Christophe Louis, MD;  Location: [place];  Service: Gynecology;  Laterality: N/A;  . PORT-A-CATH REMOVAL Right 01/07/2020   Procedure: REMOVAL PORT-A-CATH;  Surgeon: Donnie Mesa, MD;  Location: [place];  Service: General;  Laterality: Right;  . PORTACATH PLACEMENT Right 05/22/2019   Procedure: INSERTION PORT-A-CATH WITH ULTRASOUND GUIDANCE;  Surgeon: Donnie Mesa, MD;  Location: [facility];  Service: General;  Laterality: Right;  Endotrachial tube  . WISDOM TOOTH EXTRACTION    . WISDOM TOOTH EXTRACTION      Current Outpatient Medications:  .  acetaminophen (TYLENOL) 500 MG tablet, Take 2 tablets (1,000 mg total) by mouth every 8 (eight) hours as needed., Disp: 30 tablet, Rfl: 0 .  amLODipine (NORVASC) 5 MG tablet, Take 5 mg by mouth daily. , Disp: , Rfl: 5 .  anastrozole (ARIMIDEX) 1 MG tablet, Take 1 tablet (1  mg total) by mouth daily., Disp:  90 tablet, Rfl: 4 .  Cholecalciferol 4000 UNITS CAPS, Take 4,000 Units by mouth daily. , Disp: , Rfl:  .  citalopram (CELEXA) 40 MG tablet, Take 40 mg by mouth daily., Disp: , Rfl:  .  diclofenac Sodium (VOLTAREN) 1 % GEL, Apply 1 application topically 4 (four) times daily as needed (knee pain.)., Disp: , Rfl:  .  EDARBYCLOR 40-25 MG TABS, Take 1 tablet by mouth daily., Disp: , Rfl: 5 .  ibuprofen (ADVIL) 800 MG tablet, Take 1 tablet (800 mg total) by mouth every 8 (eight) hours as needed., Disp: 30 tablet, Rfl: 0 .  L-Methylfolate-B12-B6-B2 (CEREFOLIN) 07-29-48-5 MG TABS, Take 1 tablet by mouth every morning., Disp: 90 tablet, Rfl: 3 .  levothyroxine (SYNTHROID) 175 MCG tablet, Take 175 mcg by mouth daily before breakfast. , Disp: , Rfl: 5 .  loratadine (CLARITIN) 10 MG tablet, TAKE 1 TABLET BY MOUTH EVERY DAY (Patient taking differently: Take 10 mg by mouth daily as needed for allergies.), Disp: 90 tablet, Rfl: 1 .  metoprolol (TOPROL-XL) 200 MG 24 hr tablet, Take 200 mg by mouth daily. , Disp: , Rfl: 5 .  pantoprazole (PROTONIX) 40 MG tablet, Take 40 mg by mouth 2 (two) times daily. , Disp: , Rfl:  .  potassium chloride (KLOR-CON) 20 MEQ packet, Take 40 mEq by mouth daily. , Disp: , Rfl:   Clinical Interview:   The following information was obtained during a clinical interview with Loretta Nelson prior to cognitive testing.  Cognitive Symptoms: Decreased short-term memory: Endorsed. She reported largely generalized difficulties with short-term memory. Specific examples included trouble recalling the names of familiar individuals and forgetting to remember how to perform various work-related actions on her computer. She noted that difficulties have begun to interfere with her daily functioning and that she feels that these symptoms are beyond what is "normal."  Decreased long-term memory: Denied. Decreased attention/concentration: Endorsed. She reported trouble with  sustained attention as well as increased distractibility.  Reduced processing speed: Endorsed. She reported ongoing symptoms of brain fog.  Difficulties with executive functions: Endorsed. She described primary difficulties with multi-tasking. She also noted that she has to focus far more intently on organizational pursuits. She reported taking numerous written notes and that if she does not do this, she will likely forget about things she needs to accomplish. Trouble with impulsivity or overt personality changes were denied.  Difficulties with emotion regulation: Denied. Difficulties with receptive language: Denied. Difficulties with word finding: Endorsed. She reported feeling as though she needs to think very hard about the words she wishes to use while speaking. Otherwise, she reported instances where she will say the wrong thing. Decreased visuoperceptual ability: Denied.  Trajectory of deficits: Deficits were said to be present for the past 1-2 years and were notably worsened following chemotherapy and radiation treatment stemming from her history of breast cancer. She was diagnosed with left sidedbreast cancer in January 2021, was s/p surgery in February 2021, underwent adjuvant chemotherapy between March and June 2021, and underwent adjuvant radiation therapy throughout the entire month of July 2021. Cognitive deficits have persisted and mildly worsened since the end of treatment per Loretta Nelson.   Difficulties completing ADLs: Denied.  Additional Medical History: History of traumatic brain injury/concussion: Unclear. Neuroimaging records suggest that Ms. Brandon sustained a fall September 2016. She reported that, at that time, her doctor told her that she "could have sustained a mild concussion." However, she was unclear if this was the case. No other head injuries  were reported.  History of stroke: Denied. History of seizure activity: Denied. History of known exposure to toxins:  Denied. Symptoms of chronic pain: Endorsed. Pain symptoms were largely attributed to past breast cancer treatment. She also reported ongoing pain symptoms with her knees.  Experience of frequent headaches/migraines: Denied. However, she did acknowledge a remote history of migraine headaches.  Frequent instances of dizziness/vertigo: Denied. However, she did note a few instances where she will experience dizziness and pass out after strenuous laughter. This is what was said to have prompted her September 2016 fall and potential head injury.   Sensory changes: Ongoing visual changes were attributed to cancer treatment. She wears glasses which are helpful. She also reported the potential for mild hearing loss; however, a formal evaluation had not been completed. Other sensory changes/difficulties (e.g., taste or smell) were denied.  Balance/coordination difficulties: Denied. Other motor difficulties: Denied.  Sleep History: Estimated hours obtained each night: 7-8 hours.  Difficulties falling asleep: Denied. Difficulties staying asleep: Denied. Feels rested and refreshed upon awakening: Endorsed.  History of snoring: Endorsed. History of waking up gasping for air: Endorsed. Witnessed breath cessation while asleep: Endorsed. She acknowledged being recently diagnosed with obstructive sleep apnea and has been utilizing her CPAP nightly with positive effect.   History of vivid dreaming: Denied. Excessive movement while asleep: Endorsed. Instances of acting out her dreams: Endorsed. She reported infrequent instances where she will physically kick and/or punch while in her sleep. She also described infrequent instances where she will call out to others. This was said to partially be related to dream content.   Psychiatric/Behavioral Health History: Depression: Ms. Franne Forts described her current mood as "up and down" and reported several ongoing psychosocial stressors, chiefly various medical concerns.  She denied to her knowledge ever being formally diagnosed with a depressive disorder. She reported working with a therapist/counselor in her 39s which was helpful at that time. Current or remote suicidal ideation, intent, or plan was denied.  Anxiety: Endorsed. She reported a longstanding history of anxiety dating back to her 33s and her transition into college and early adulthood. She reported that current medications were likely helpful. However, she could not be sure as she reported taking these medications for a very extended period of time at this point. She additionally reported a history of unspecified OCD tendencies.  Mania: Denied. Trauma History: Denied. Visual/auditory hallucinations: Denied. Delusional thoughts: Denied.  Tobacco: Denied. Alcohol: She denied current alcohol consumption as well as a history of problematic alcohol abuse or dependence.  Recreational drugs: Denied. Caffeine: She reported occasional caffeine consumption in the form of coffee, soda, or tea.   Family History: Problem Relation Age of Onset  . Hypertension Mother   . Alcoholism Father   . CAD Maternal Grandfather   . Breast cancer Paternal Grandmother   . HIV Brother   . Heart attack Brother        sudden MI vs PE  . Breast cancer Sister 59  . Breast cancer Other        one dx 5s, others dx in 59s  . Alzheimer's disease Other   . Colon polyps Neg Hx   . Colon cancer Neg Hx   . Liver disease Neg Hx    This information was confirmed by Ms. Castiglia.  Academic/Vocational History: Highest level of educational attainment: 18 years. She earned her Master's degree in Transport planner and described herself as a good (A/B) student in academic settings. No relative weaknesses were identified.  History of  developmental delay: Denied. History of grade repetition: Denied. Enrollment in special education courses: Denied. History of LD/ADHD: Denied.  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   Evaluation Results:   Behavioral Observations: Ms. Swendsen was unaccompanied, arrived to her appointment on time, and was appropriately dressed and groomed. she appeared alert and oriented. Observed gait and station were within normal limits. Gross motor functioning appeared intact upon informal observation and no abnormal movements (e.g., tremors) were noted. Her affect was generally relaxed and positive, but did range appropriately given the subject being discussed during the clinical interview or the task at hand during testing procedures. Spontaneous speech was fluent and word finding difficulties were not observed during the clinical interview. Thought processes were coherent, organized, and normal in content. Insight into her cognitive difficulties appeared adequate. During testing, sustained attention was appropriate. Task engagement was adequate and she persisted when challenged. Overall, Ms. Friedland was cooperative with the clinical interview and subsequent testing procedures.   Adequacy of Effort: The validity of neuropsychological testing is limited by the extent to which the individual being tested may be assumed to have exerted adequate effort during testing. Ms. Niese expressed her intention to perform to the best of her abilities and exhibited adequate task engagement and persistence. Scores across stand-alone and embedded performance validity measures were within expectation. As such, the results of the current evaluation are believed to be a valid representation of Ms. Streicher's current cognitive functioning.  Test Results: Ms. Krummel was fully oriented at the time of the current evaluation.  Intellectual abilities based upon educational and vocational  attainment were estimated to be in the average to above average range. Premorbid abilities were estimated to be within the above average range based upon a single-word reading test.   Processing speed was average to above average. Basic attention was well above average. More complex attention (e.g., working memory) was exceptionally high. Executive functioning was average to exceptionally high.  Assessed receptive language abilities were average. Likewise, Ms. Sherr did not exhibit any difficulties comprehending task instructions and answered all questions asked of her appropriately. Assessed expressive language (e.g., verbal fluency and confrontation naming) was average to well above average.     Assessed visuospatial/visuoconstructional abilities were above average.    Learning (i.e., encoding) of novel verbal and visual information was variable, ranging from the well below average to average normative ranges. Spontaneous delayed recall (i.e., retrieval) of previously learned information was also variable, ranging from the exceptionally low to below average ranges. Retention rates were 92% across a story learning task, 83% across a list learning task, 33% across a shape learning task, and 87% across a complex figure drawing task. Performance across recognition tasks was below average to average, suggesting evidence for information consolidation.   Results of emotional screening instruments suggested that recent symptoms of generalized anxiety were in the mild range, while symptoms of depression were also within the mild range. A screening instrument assessing recent sleep quality suggested the presence of minimal sleep dysfunction.  Tables of Scores:   Note: This summary of test scores accompanies the interpretive report and should not be considered in isolation without reference to the appropriate sections in the text. Descriptors are based on appropriate normative data and may be adjusted based on  clinical judgment. The terms "impaired" and "within normal limits (  WNL)" are used when a more specific level of functioning cannot be determined.       Effort Testing:   DESCRIPTOR       ACS Word Choice: --- --- Within Expectation  Dot Counting Test: --- --- Within Expectation  NAB EVI: --- --- Within Expectation  D-KEFS Color Word Effort Index: --- --- Within Expectation       Orientation:      Raw Score Percentile   NAB Orientation, Form 1 29/29 --- ---       Cognitive Screening:           Raw Score Percentile   SLUMS: 26/30 --- ---       Intellectual Functioning:           Standard Score Percentile   Test of Premorbid Functioning: 116 86 Above Average       Memory:          NAB Memory Module, Form 2: T Score Percentile   List Learning       Total Trials 1-3 21/36 (37) 9 Below Average    List B 5/12 (48) 42 Average    Short Delay Free Recall 6/12 (34) 5 Well Below Average    Long Delay Free Recall 5/12 (30) 2 Well Below Average    Retention Percentage 83 (42) 21 Below Average    Recognition Discriminability 6 (40) 16 Below Average  Shape Learning       Total Trials 1-3 17/27 (47) 38 Average    Delayed Recall 2/9 (19) <1 Exceptionally Low    Retention Percentage 33 (29) 2 Exceptionally Low    Recognition Discriminability 8 (53) 62 Average  Story Learning       Immediate Recall 39/80 (35) 7 Well Below Average    Delayed Recall 23/40 (41) 18 Below Average    Retention Percentage 92 (52) 58 Average  Daily Living Memory       Immediate Recall 47/51 (54) 66 Average    Delayed Recall 14/17 (40) 16 Below Average    Retention Percentage 82 (44) 27 Average    Recognition Hits 8/10 (43) 25 Average       Attention/Executive Function:          Trail Making Test (TMT): Raw Score (T Score) Percentile     Part A 24 secs.,  0 errors (55) 69 Average    Part B 33 secs.,  0 errors (75) 99 Exceptionally High         Scaled Score Percentile   WAIS-IV Coding: 12 75 Above Average        NAB Attention Module, Form 1: T Score Percentile     Digits Forward 69 97 Well Above Average    Digits Backwards 72 99 Exceptionally High       D-KEFS Color-Word Interference Test: Raw Score (Scaled Score) Percentile     Color Naming 22 secs. (13) 84 Above Average    Word Reading 17 secs. (13) 84 Above Average    Inhibition 46 secs. (13) 84 Above Average      Total Errors 0 errors (12) 75 Above Average    Inhibition/Switching 48 secs. (13) 84 Above Average      Total Errors 1 error (11) 63 Average       D-KEFS Verbal Fluency Test: Raw Score (Scaled Score) Percentile     Letter Total Correct 54 (15) 95 Well Above Average    Category Total Correct 40 (11) 63 Average    Category Switching  Total Correct 15 (12) 75 Above Average    Category Switching Accuracy 14 (12) 75 Above Average      Total Set Loss Errors 0 (13) 84 Above Average      Total Repetition Errors 1 (12) 75 Above Average       D-KEFS 20 Questions Test: Scaled Score Percentile     Total Weighted Achievement Score 12 75 Above Average    Initial Abstraction Score 11 63 Average       [place]consin Card Sorting Test: Raw Score Percentile     Categories (trials) 5 (64) >16 Within Normal Limits    Total Errors 7 86 Above Average    Perseverative Errors 4 66 Average    Non-Perseverative Errors 3 84 Above Average    Failure to Maintain Set 0 --- ---       Language:          Verbal Fluency Test: Raw Score (T Score) Percentile     Phonemic Fluency (FAS) 54 (61) 86 Above Average    Animal Fluency 20 (49) 46 Average        NAB Language Module, Form 1: T Score Percentile     Auditory Comprehension 55 69 Average    Naming 31/31 (53) 62 Average       Visuospatial/Visuoconstruction:      Raw Score Percentile   Clock Drawing: 10/10 --- Within Normal Limits       NAB Spatial Module, Form 1: T Score Percentile     Figure Drawing Copy 63 91 Well Above Average    Figure Drawing Immediate Recall 61 86 Above Average         Scaled Score Percentile   WAIS-IV Block Design: 14 91 Above Average  WAIS-IV Matrix Reasoning: 12 75 Above Average       Mood and Personality:      Raw Score Percentile   Beck Depression Inventory - II: 15 --- Mild  PROMIS Anxiety Questionnaire: 18 --- Mild       Additional Questionnaires:      Raw Score Percentile   PROMIS Sleep Disturbance Questionnaire: 24 --- None to Slight   Informed Consent and Coding/Compliance:   Ms. Zemanek was provided with a verbal description of the nature and purpose of the present neuropsychological evaluation. Also reviewed were the foreseeable risks and/or discomforts and benefits of the procedure, limits of confidentiality, and mandatory reporting requirements of this provider. The patient was given the opportunity to ask questions and receive answers about the evaluation. Oral consent to participate was provided by the patient.   This evaluation was conducted by Christia Reading, Ph.D., licensed clinical neuropsychologist. Ms. Spruell completed a comprehensive clinical interview with Dr. Melvyn Novas, billed as one unit [phone], and 160 minutes of cognitive testing and scoring, billed as one unit [phone] and four additional units [id number]. Psychometrist Milana Kidney, B.S., assisted Dr. Melvyn Novas with test administration and scoring procedures. As a separate and discrete service, Dr. Melvyn Novas spent a total of 120 minutes in interpretation and report writing billed as one unit [phone] and one unit [phone].

## 2020-03-03 NOTE — Progress Notes (Signed)
   Psychometrician Note   Cognitive testing was administered to Loretta Nelson by Wallace Keller, B.S. (psychometrist) under the supervision of Dr. Newman Nickels, Ph.D., licensed psychologist on 03/03/20. Loretta Nelson did not appear overtly distressed by the testing session per behavioral observation or responses across self-report questionnaires. Dr. Newman Nickels, Ph.D. checked in with Loretta Nelson as needed to manage any distress related to testing procedures (if applicable). Rest breaks were offered.    The battery of tests administered was selected by Dr. Newman Nickels, Ph.D. with consideration to Loretta Nelson's current level of functioning, the nature of her symptoms, emotional and behavioral responses during interview, level of literacy, observed level of motivation/effort, and the nature of the referral question. This battery was communicated to the psychometrist. Communication between Dr. Newman Nickels, Ph.D. and the psychometrist was ongoing throughout the evaluation and Dr. Newman Nickels, Ph.D. was immediately accessible at all times. Dr. Newman Nickels, Ph.D. provided supervision to the psychometrist on the date of this service to the extent necessary to assure the quality of all services provided.    Loretta Nelson Reasons will return within approximately 1-2 weeks for an interactive feedback session with Dr. Milbert Coulter at which time her test performances, clinical impressions, and treatment recommendations will be reviewed in detail. Loretta Nelson understands she can contact our office should she require our assistance before this time.  A total of 160 minutes of billable time were spent face-to-face with Loretta Nelson by the psychometrist. This includes both test administration and scoring time. Billing for these services is reflected in the clinical report generated by Dr. Newman Nickels, Ph.D..  This note reflects time spent with the psychometrician and does not include test scores or any  clinical interpretations made by Dr. Milbert Coulter. The full report will follow in a separate note.

## 2020-03-11 ENCOUNTER — Other Ambulatory Visit: Payer: Self-pay

## 2020-03-11 ENCOUNTER — Ambulatory Visit (INDEPENDENT_AMBULATORY_CARE_PROVIDER_SITE_OTHER): Payer: BC Managed Care – PPO | Admitting: Psychology

## 2020-03-11 DIAGNOSIS — R4189 Other symptoms and signs involving cognitive functions and awareness: Secondary | ICD-10-CM | POA: Diagnosis not present

## 2020-03-11 NOTE — Progress Notes (Signed)
   Neuropsychology Feedback Session Loretta Nelson. [facility] Department of Neurology  Reason for Referral:   Loretta Nelson a 55 y.o. right-handed African-American female referred by Debbora Presto, NP,to characterize hercurrent cognitive functioning and assist with diagnostic clarity and treatment planning in the context of subjective cognitive decline.   Feedback:   Loretta Nelson completed a comprehensive neuropsychological evaluation on 03/03/2020. Please refer to that encounter for the full report and recommendations. Briefly, results suggested isolated variability across both encoding (i.e., learning) and retrieval aspects of memory. Given variability, memory performances as a whole were still mildly below expectation given premorbid intellectual estimations. Retention rates were largely appropriate and she performed well on recognition trials, thus not suggestive of a memory storage deficit. Performance across all other assessed cognitive domains was quite strong. The etiology is somewhat unclear at the present time. However, I do feel it likely that Loretta Nelson's history of chemotherapy and radiation treatment is directly influencing current cognitive performances. While the scientific literature is admittedly mixed, there are numerous studies which purport that cognitive dysfunction following this form of treatment can persist months to years following treatment cessation. Encoding/retrieval aspects of memory are commonly implicated as susceptible cognitive domains within these studies. Furthermore, while mixed, research also suggests that post-treatment oral chemotherapy medications (e.g., anastrozole/Arimidex) have cognitive side effects, including those which influence memory abilities.  Loretta Nelson was unaccompanied during the current feedback session. Content of the current session focused on the results of her neuropsychological evaluation. Loretta Nelson was given the opportunity  to ask questions and her questions were answered. She was encouraged to reach out should additional questions arise. A copy of her report was provided at the conclusion of the visit.      22 minutes were spent conducting the current feedback session with Loretta Nelson, billed as one unit [id number].

## 2020-03-11 NOTE — Patient Instructions (Signed)
Should Loretta Nelson report significant cognitive or functional decline in the future, a repeat neuropsychological evaluation would be warranted at that time. The current evaluation will serve as an excellent baseline to compare future evaluations against. There is no need for a repeat evaluation at a specific time interval based upon current test scores.   Updated neuroimaging (i.e., brain MRI) could be considered given that previous scans were completed in 2011 (MRI) and 2016 (CT), both prior to her completing chemotherapy and radiation treatment. She should discuss the pros and cons of this with Loretta Nelson.   A combination of medication and psychotherapy has been shown to be most effective at treating symptoms of anxiety and depression. As such, Loretta Nelson is encouraged to speak with her prescribing physician regarding medication adjustments to optimally manage these symptoms. Likewise, Loretta Nelson is encouraged to consider engaging in short-term psychotherapy to address symptoms of psychiatric distress. She would benefit from an active and collaborative therapeutic environment, rather than one purely supportive in nature. Recommended treatment modalities include Cognitive Behavioral Therapy (CBT) or Acceptance and Commitment Therapy (ACT).  Loretta Nelson is encouraged to attend to lifestyle factors for brain health (e.g., regular physical exercise, good nutrition habits, regular participation in cognitively-stimulating activities, and general stress management techniques), which are likely to have benefits for both emotional adjustment and cognition. In fact, in addition to promoting good general health, regular exercise incorporating aerobic activities (e.g., brisk walking, jogging, cycling, etc.) has been demonstrated to be a very effective treatment for depression and stress, with similar efficacy rates to both antidepressant medication and psychotherapy. Optimal control of vascular risk factors (including  safe cardiovascular exercise and adherence to dietary recommendations) is encouraged. Likewise, continued compliance with her CPAP machine will also be important.  If interested, there are some activities which have therapeutic value and can be useful in keeping her cognitively stimulated. For suggestions, Loretta Nelson is encouraged to go to the following website: https://www.[facility].org/get-to-know-barrow/centers-programs/neurorehabilitation-center/neuro-rehab-apps-and-games/ which has options, categorized by level of difficulty. It should be noted that these activities should not be viewed as a substitute for therapy.  When learning new information, she would benefit from information being broken up into small, manageable pieces. She may also find it helpful to articulate the material in her own words and in a context to promote encoding at the onset of a new task. This material may need to be repeated multiple times to promote encoding.  Memory can be improved using internal strategies such as rehearsal, repetition, chunking, mnemonics, association, and imagery. External strategies such as written notes in a consistently used memory journal, visual and nonverbal auditory cues such as a calendar on the refrigerator or appointments with alarm, such as on a cell phone, can also help maximize recall.    To address problems with fluctuating attention, she may wish to consider:   -Avoiding external distractions when needing to concentrate   -Limiting exposure to fast paced environments with multiple sensory demands   -Writing down complicated information and using checklists   -Attempting and completing one task at a time (i.e., no multi-tasking)   -Verbalizing aloud each step of a task to maintain focus   -Reducing the amount of information considered at one time

## 2020-03-23 ENCOUNTER — Ambulatory Visit: Payer: BC Managed Care – PPO | Attending: Radiation Oncology

## 2020-03-26 ENCOUNTER — Other Ambulatory Visit: Payer: Self-pay | Admitting: Oncology

## 2020-05-01 ENCOUNTER — Other Ambulatory Visit: Payer: Self-pay

## 2020-05-01 ENCOUNTER — Ambulatory Visit
Admission: RE | Admit: 2020-05-01 | Discharge: 2020-05-01 | Disposition: A | Discharge status: Home / Self Care | Payer: BC Managed Care – PPO | Source: Ambulatory Visit | Attending: Oncology | Admitting: Oncology

## 2020-05-01 DIAGNOSIS — C50412 Malignant neoplasm of upper-outer quadrant of left female breast: Secondary | ICD-10-CM

## 2020-05-01 DIAGNOSIS — Z170 Estrogen receptor positive status [ER+]: Secondary | ICD-10-CM

## 2020-06-10 NOTE — Progress Notes (Signed)
[place]  [phone] [phone]     ID: Loretta Nelson DOB: 09-Apr-1965  MR#: [id number]  BWL#:[id number]  Patient Care Team: [name], MD as PCP - General (Family Medicine) Mauro Kaufmann, RN as Oncology Nurse Navigator Rockwell Germany, RN as Oncology Nurse Navigator Donnie Mesa, MD as Consulting Physician (General Surgery) Journee Bobrowski, Virgie Dad, MD as Consulting Physician (Oncology) Kyung Rudd, MD as Consulting Physician (Radiation Oncology) Delrae Rend, MD as Consulting Physician (Endocrinology) Star Age, MD as Consulting Physician (Neurology) Garvin Fila, MD as Consulting Physician (Neurology) Christophe Louis, MD as Consulting Physician (Obstetrics and Gynecology) Arta Silence, MD as Consulting Physician (Gastroenterology) Chauncey Cruel, MD OTHER MD:   CHIEF COMPLAINT: triple negative breast cancer  CURRENT TREATMENT: anastrozole, intensified screening  INTERVAL HISTORY: Loretta Nelson returns today for follow up of her triple negative breast cancer.    She started anastrozole in 09/2019.  She has occasional hot flashes and a little bit of vaginal dryness which is not a major concern.  She wonders if her fatigue is related to the anastrozole  Since her last visit, she underwent port removal on 01/07/2020.   She noticed a palpable abnormality within her left breast following a fall on 12/26/2019.  She underwent appropriate imaging 01/21/2020 showing: palpable abnormality corresponds with 6.3 cm seroma; no evidence for malignancy.  She also underwent explorative laparotomy and bilateral salpingo-oophorectomy on 02/12/2020 under Dr. Landry Mellow. Pathology from the procedure [phone]) was benign, as expected.  Finally, she also underwent bilateral diagnostic mammography with tomography at [facility] on 05/01/2020 showing: breast density category B; no evidence of malignancy in either breast.    REVIEW OF  SYSTEMS: [name] feels tired.  She is working full-time.  At home her mother is 17 years old and able to care for herself but does not otherwise help much.  She has very variable sleep.  She is now participating in a neighborhood walking challenge and trying to do 60 miles in a month but she says she is near the bottom of the pile when she looks at everyone else's results.  Nevertheless she is doing that which is very favorable.  A detailed review of systems was otherwise stable.   COVID 19 VACCINATION STATUS: Has received 2 Pfizer doses, most recently March 2021   HISTORY OF CURRENT ILLNESS: From the original intake note:  [name] had routine screening mammography on 03/19/2019 showing a possible abnormality in the left breast. She underwent left diagnostic mammography with tomography and left breast ultrasonography at [facility] on 03/29/2019 showing: breast density category B; 8 mm mass in left breast at 12 o'clock; left axilla negative for lymphadenopathy.  Accordingly on 04/03/2019 she proceeded to biopsy of the left breast area in question. The pathology from this procedure ([id number]) showed: invasive mammary carcinoma, grade 3, e-cadherin positive. Prognostic indicators significant for: estrogen receptor, 10% positive with weak staining intensity and progesterone receptor, 0% negative. Proliferation marker Ki67 at 30%. HER2 negative by immunohistochemistry (1+).  The patient's subsequent history is as detailed below.   PAST MEDICAL HISTORY: Past Medical History:  Diagnosis Date  . Allergic rhinitis   . 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 Complication of anesthesia    woke up during EGD  . Dysphagia   . Empty sella syndrome   . Essential hypertension   . Family history of breast cancer   . Fibroids, intramural 02/17/2017  . Gastroesophageal reflux disease   . Generalized anxiety  disorder   . Graves disease 01/13/11   radioactive iodine treatment, 01.0  millicuries  . Hiatal hernia    small  . History of chemotherapy   . History of hysterectomy 02/17/2017  . History of migraine headaches   . History of radiation therapy   . Iron deficiency anemia   . 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 Meralgia paresthetica   . Murmur    benign, h/o, caused by hyperdynamic contraction  . Obesity   . Obstructive sleep apnea   . Pneumonia 2017   inhaler prescribed  . Postoperative hypothyroidism   . 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 Tendonitis of shoulder, right   . Vitamin D deficiency     PAST SURGICAL HISTORY: Past Surgical History:  Procedure Laterality Date  . ABDOMINAL HYSTERECTOMY  2018  . BALLOON DILATION N/A 09/07/2016   Procedure: BALLOON DILATION;  Surgeon: Arta Silence, MD;  Location: [facility] ENDOSCOPY;  Service: Endoscopy;  Laterality: N/A;  . BREAST LUMPECTOMY Left 03/2019  . BREAST LUMPECTOMY WITH RADIOACTIVE SEED AND SENTINEL LYMPH NODE BIOPSY Left 04/17/2019   Procedure: LEFT BREAST LUMPECTOMY WITH RADIOACTIVE SEED AND SENTINEL LYMPH NODE BIOPSY;  Surgeon: Donnie Mesa, MD;  Location: [facility];  Service: General;  Laterality: Left;  LMA, PEC BLOCK  . CESAREAN SECTION  1999  . COLONOSCOPY    . DILATION AND CURETTAGE OF UTERUS    . ESOPHAGOGASTRODUODENOSCOPY    . ESOPHAGOGASTRODUODENOSCOPY (EGD) WITH PROPOFOL N/A 09/07/2016   Procedure: ESOPHAGOGASTRODUODENOSCOPY (EGD) WITH PROPOFOL;  Surgeon: Arta Silence, MD;  Location: [place];  Service: Endoscopy;  Laterality: N/A;  . HYSTERECTOMY ABDOMINAL WITH SALPINGECTOMY Bilateral 02/17/2017   Procedure: HYSTERECTOMY ABDOMINAL WITH SALPINGECTOMY;  Surgeon: Christophe Louis, MD;  Location: [facility];  Service: Gynecology;  Laterality: Bilateral;  . KNEE ARTHROSCOPY     for torn meniscus  . LAPAROSCOPIC SALPINGO OOPHERECTOMY N/A 02/12/2020   Procedure: DIAGNOSTIC LAPAROSCOPY ;  Surgeon: Christophe Louis, MD;   Location: [place];  Service: Gynecology;  Laterality: N/A;  . LAPAROTOMY Bilateral 02/12/2020   Procedure: EXPLORATORY LAPAROTOMY WITH BILATERAL SALPINGO OOPHORECTOMY;  Surgeon: Christophe Louis, MD;  Location: [place];  Service: Gynecology;  Laterality: Bilateral;  . LYSIS OF ADHESION N/A 02/12/2020   Procedure: LYSIS OF ADHESION;  Surgeon: Christophe Louis, MD;  Location: [place];  Service: Gynecology;  Laterality: N/A;  . PORT-A-CATH REMOVAL Right 01/07/2020   Procedure: REMOVAL PORT-A-CATH;  Surgeon: Donnie Mesa, MD;  Location: [place];  Service: General;  Laterality: Right;  . PORTACATH PLACEMENT Right 05/22/2019   Procedure: INSERTION PORT-A-CATH WITH ULTRASOUND GUIDANCE;  Surgeon: Donnie Mesa, MD;  Location: [facility];  Service: General;  Laterality: Right;  Endotrachial tube  . WISDOM TOOTH EXTRACTION    . WISDOM TOOTH EXTRACTION      FAMILY HISTORY: Family History  Problem Relation Age of Onset  . Hypertension Mother   . Alcoholism Father   . CAD Maternal Grandfather   . Breast cancer Paternal Grandmother   . HIV Brother   . Heart attack Brother        sudden MI vs PE  . Breast cancer Sister 8  . Breast cancer Other        one dx 69s, others dx in 15s  . Alzheimer's disease Other   . Colon polyps Neg Hx   . Colon cancer Neg Hx   . Liver disease Neg Hx   Patient's father was in his late  29s when he died from causes unknown to the patient.  Patient's mother is 11 years old (as of 04/2019). The patient denies a family hx of ovarian cancer. She reports breast cancer in her sister at age 5, in her paternal grandmother, and in 3 paternal cousins. She had 4 siblings, 2 brothers and 2 sisters, though only her sisters are still living.   GYNECOLOGIC HISTORY:  Patient's last menstrual period was 02/13/2017 (exact date). Menarche: 55 years old Age at first live birth: 55 years old [place] P 2 (twins) LMP 01/2017 Contraceptive: used from 2440-1027 without complication HRT never used  Hysterectomy?  Yes, 01/2017, benign pathology BSO? 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 She is divorced. She lives at home with her twin daughters, Maryjo Rochester and Edmon Crape, who are 55 year old college graduate is currently working for [employer].Maryjo Rochester studied music and communications at [employer].Edmon Crape graduated in IT.  The patient's mother in her late 61s also lives with them.  One of the patient's daughter was found to be positive for the mutation the other negative.   ADVANCED DIRECTIVES: Not in place; the patient intends to name her sister Madelon Lips as her HCPOA [phone])   HEALTH MAINTENANCE: Social History   Tobacco Use  . Smoking status: Never Smoker  . Smokeless tobacco: Never Used  Vaping Use  . Vaping Use: Never used  Substance Use Topics  . Alcohol use: No  . Drug use: No     Colonoscopy: at age 31  PAP: 02/2018  Bone density: never done   Allergies  Allergen Reactions  . Tape Itching and Other (See Comments)    Adhesive tape--redness/inflammation/itching    Current Outpatient Medications  Medication Sig Dispense Refill  . acetaminophen (TYLENOL) 500 MG tablet Take 2 tablets (1,000 mg total) by mouth every 8 (eight) hours as needed. 30 tablet 0  . amLODipine (NORVASC) 5 MG tablet Take 5 mg by mouth daily.   5  . anastrozole (ARIMIDEX) 1 MG tablet Take 1 tablet (1 mg total) by mouth daily. 90 tablet 4  . Cholecalciferol 4000 UNITS CAPS Take 4,000 Units by mouth daily.     . citalopram (CELEXA) 40 MG tablet Take 40 mg by mouth daily.    . diclofenac Sodium (VOLTAREN) 1 % GEL Apply 1 application topically 4 (four) times daily as needed (knee pain.).    Marland Kitchen EDARBYCLOR 40-25 MG TABS Take 1 tablet by mouth daily.  5  . ibuprofen (ADVIL) 800 MG tablet Take 1 tablet (800 mg total) by mouth every 8 (eight) hours as needed. 30 tablet 0  . L-Methylfolate-B12-B6-B2  (CEREFOLIN) 07-29-48-5 MG TABS Take 1 tablet by mouth every morning. 90 tablet 3  . levothyroxine (SYNTHROID) 175 MCG tablet Take 175 mcg by mouth daily before breakfast.   5  . loratadine (CLARITIN) 10 MG tablet TAKE 1 TABLET BY MOUTH EVERY DAY 90 tablet 1  . metoprolol (TOPROL-XL) 200 MG 24 hr tablet Take 200 mg by mouth daily.   5  . pantoprazole (PROTONIX) 40 MG tablet Take 40 mg by mouth 2 (two) times daily.     . potassium chloride (KLOR-CON) 20 MEQ packet Take 40 mEq by mouth daily.      No current facility-administered medications for this visit.    OBJECTIVE: African-American woman who appears stated age  55:   06/11/20 1149  BP: (!) 169/73  Pulse:  88  Resp: 15  Temp: (!) 97.5 F (36.4 C)  SpO2: 100%     Body mass index is 51.84 kg/m.   Wt Readings from Last 3 Encounters:  06/11/20 (!) 302 lb (137 kg)  02/12/20 296 lb 4.8 oz (134.4 kg)  02/07/20 296 lb 4.8 oz (134.4 kg)      ECOG FS:1 - Symptomatic but completely ambulatory  Sclerae unicteric, EOMs intact Wearing a mask No cervical or supraclavicular adenopathy Lungs no rales or rhonchi Heart regular rate and rhythm Abd soft, nontender, positive bowel sounds MSK no focal spinal tenderness, no upper extremity lymphedema Neuro: nonfocal, well oriented, appropriate affect Breasts: The right breast on both axillae are benign.  The left breast is status postlumpectomy and radiation.  There are the expected changes but no evidence of local recurrence.   LAB RESULTS:  CMP     Component Value Date/Time   NA 141 02/07/2020 1107   K 3.8 02/07/2020 1107   CL 102 02/07/2020 1107   CO2 29 02/07/2020 1107   GLUCOSE 102 (H) 02/07/2020 1107   BUN 14 02/07/2020 1107   CREATININE 0.71 02/07/2020 1107   CREATININE 0.83 04/10/2019 1240   CALCIUM 9.6 02/07/2020 1107   PROT 6.9 10/24/2019 1323   ALBUMIN 3.6 10/24/2019 1323   AST 14 (L) 10/24/2019 1323   AST 17 04/10/2019 1240   ALT 9 10/24/2019 1323   ALT 19  04/10/2019 1240   ALKPHOS 62 10/24/2019 1323   BILITOT 0.4 10/24/2019 1323   BILITOT 0.4 04/10/2019 1240   GFRNONAA >60 02/07/2020 1107   GFRNONAA >60 04/10/2019 1240   GFRAA >60 10/24/2019 1323   GFRAA >60 04/10/2019 1240    No results found for: TOTALPROTELP, ALBUMINELP, A1GS, A2GS, BETS, BETA2SER, GAMS, MSPIKE, SPEI  Lab Results  Component Value Date   WBC 5.0 06/11/2020   NEUTROABS 3.0 06/11/2020   HGB 11.4 (L) 06/11/2020   HCT 36.1 06/11/2020   MCV 81.3 06/11/2020   PLT 193 06/11/2020    No results found for: LABCA2  No components found for: [id number]  No results for input(s): INR in the last 168 hours.  No results found for: LABCA2  No results found for: WPV948  No results found for: AXK553  No results found for: ZSM270  No results found for: CA2729  No components found for: HGQUANT  No results found for: CEA1 / No results found for: CEA1   No results found for: AFPTUMOR  No results found for: CHROMOGRNA  No results found for: KPAFRELGTCHN, LAMBDASER, KAPLAMBRATIO (kappa/lambda light chains)  No results found for: HGBA, HGBA2QUANT, HGBFQUANT, HGBSQUAN (Hemoglobinopathy evaluation)   No results found for: LDH  No results found for: IRON, TIBC, IRONPCTSAT (Iron and TIBC)  No results found for: FERRITIN  Urinalysis No results found for: COLORURINE, APPEARANCEUR, LABSPEC, PHURINE, GLUCOSEU, HGBUR, BILIRUBINUR, KETONESUR, PROTEINUR, UROBILINOGEN, NITRITE, LEUKOCYTESUR   STUDIES: No results found.   ELIGIBLE FOR AVAILABLE RESEARCH PROTOCOL:BR003?--Depending on final surgical pathology  ASSESSMENT: 55 y.o. [place] woman status post left breast upper outer quadrant biopsy 04/03/2019 for a clinical T1b N0, stage IB invasive ductal carcinoma, grade 3, functionally triple negative (estrogen receptor is weakly positive at 10%, progesterone receptor negative, HER-2 not amplified), with an MIB-1 of 30%  (1) status post left lumpectomy and sentinel  lymph node sampling 04/17/2019 for a pT1c pN0, stage IB invasive ductal carcinoma, grade 3, triple negative, with negative margins  (a) a total of 2 sentinel lymph nodes were removed  (2)  adjuvant chemotherapy consisting of cyclophosphamide and docetaxel every 21 days x 4 started 05/23/2019, completed 07/25/2019 with no dose reductions or delays  (3) adjuvant radiation 08/13/2019 - 09/27/2019  (a) left breast / 50.4 Gy in 28 fractions  (b) seroma boost / 10 Gy in 5 fractions  (4) genetics testing 04/19/2019 through the [facility] Breast Cancer STAT Panel + Common Hereditary Cancers Panel 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 STK11, TP53, TSC1, TSC2, and VHL.  The following genes were evaluated for sequence changes only: SDHA and HOXB13 c.251G>A variant only.  (b) current data suggests an increase risk of breast and ovarian cancer in patients carrying a BARD1 mutation, but data is preliminary and insufficient for definitive recommendations  (5) prophylactic anastrozole started 10/24/2019  (6) intensified screening:  breast MRI September, mammography March on a yearly basis  (7) s/p bilateral salpingo-oophorectomy 02/12/2020 with benign pathology   PLAN: Keiondra is just a little over a year out from definitive surgery for her breast cancer with no evidence of disease recurrence.  This is favorable.  She is tolerating anastrozole  generally well.  I think her fatigue is likely not related to the anastrozole.  She has had a very difficult year this past year and of course she has other health issues quite aside from insomnia and other menopausal concerns.  The best remedy for fatigue is exercise and I am delighted she is participating in this walking challenge.  I encouraged her to continue to do that.  I am putting her in for regular MRI in September but if her insurance will not pay I will switch her to the abbreviated MRI.  She will call me and let me know if cost is an issue.  Otherwise I will see her late September and from that point I will probably see her on a yearly basis, continuing mammography in March and breast MRI in September  Total encounter time 25 minutes.  Virgie Dad. Lindora Alviar, MD 06/11/20 11:53 AM Medical Oncology and Hematology [facility] [place], [place] [address] Tel. [phone]    Fax. [phone]   I, Wilburn Mylar, am acting as scribe for Dr. Virgie Dad. Elisandro Jarrett.  I, Lurline Del MD, have reviewed the above documentation for accuracy and completeness, and I agree with the above.   *Total Encounter Time as defined by the Centers for Medicare and Medicaid Services includes, in addition to the face-to-face time of a patient visit (documented in the note above) non-face-to-face time: obtaining and reviewing outside history, ordering and reviewing medications, tests or procedures, care coordination (communications with other health care professionals or caregivers) and documentation in the medical record.

## 2020-06-11 ENCOUNTER — Inpatient Hospital Stay: Payer: BC Managed Care – PPO | Admitting: Oncology

## 2020-06-11 ENCOUNTER — Other Ambulatory Visit: Payer: Self-pay

## 2020-06-11 ENCOUNTER — Inpatient Hospital Stay: Payer: BC Managed Care – PPO | Attending: Oncology

## 2020-06-11 VITALS — BP 169/73 | HR 88 | Temp 97.5°F | Resp 15 | Ht 64.0 in | Wt 302.0 lb

## 2020-06-11 DIAGNOSIS — C50412 Malignant neoplasm of upper-outer quadrant of left female breast: Secondary | ICD-10-CM | POA: Insufficient documentation

## 2020-06-11 DIAGNOSIS — Z9221 Personal history of antineoplastic chemotherapy: Secondary | ICD-10-CM | POA: Diagnosis not present

## 2020-06-11 DIAGNOSIS — Z1501 Genetic susceptibility to malignant neoplasm of breast: Secondary | ICD-10-CM

## 2020-06-11 DIAGNOSIS — Z170 Estrogen receptor positive status [ER+]: Secondary | ICD-10-CM

## 2020-06-11 DIAGNOSIS — Z90710 Acquired absence of both cervix and uterus: Secondary | ICD-10-CM

## 2020-06-11 DIAGNOSIS — Z171 Estrogen receptor negative status [ER-]: Secondary | ICD-10-CM | POA: Insufficient documentation

## 2020-06-11 DIAGNOSIS — Z923 Personal history of irradiation: Secondary | ICD-10-CM | POA: Insufficient documentation

## 2020-06-11 LAB — CBC WITH DIFFERENTIAL/PLATELET
Abs Immature Granulocytes: 0.02 10*3/uL (ref 0.00–0.07)
Basophils Absolute: 0 10*3/uL (ref 0.0–0.1)
Basophils Relative: 1 %
Eosinophils Absolute: 0.2 10*3/uL (ref 0.0–0.5)
Eosinophils Relative: 4 %
HCT: 36.1 % (ref 36.0–46.0)
Hemoglobin: 11.4 g/dL — ABNORMAL LOW (ref 12.0–15.0)
Immature Granulocytes: 0 %
Lymphocytes Relative: 29 %
Lymphs Abs: 1.4 10*3/uL (ref 0.7–4.0)
MCH: 25.7 pg — ABNORMAL LOW (ref 26.0–34.0)
MCHC: 31.6 g/dL (ref 30.0–36.0)
MCV: 81.3 fL (ref 80.0–100.0)
Monocytes Absolute: 0.3 10*3/uL (ref 0.1–1.0)
Monocytes Relative: 6 %
Neutro Abs: 3 10*3/uL (ref 1.7–7.7)
Neutrophils Relative %: 60 %
Platelets: 193 10*3/uL (ref 150–400)
RBC: 4.44 MIL/uL (ref 3.87–5.11)
RDW: 14.4 % (ref 11.5–15.5)
WBC: 5 10*3/uL (ref 4.0–10.5)
nRBC: 0 % (ref 0.0–0.2)

## 2020-06-11 LAB — COMPREHENSIVE METABOLIC PANEL
ALT: 16 U/L (ref 0–44)
AST: 17 U/L (ref 15–41)
Albumin: 3.8 g/dL (ref 3.5–5.0)
Alkaline Phosphatase: 96 U/L (ref 38–126)
Anion gap: 11 (ref 5–15)
BUN: 12 mg/dL (ref 6–20)
CO2: 31 mmol/L (ref 22–32)
Calcium: 9.4 mg/dL (ref 8.9–10.3)
Chloride: 102 mmol/L (ref 98–111)
Creatinine, Ser: 0.78 mg/dL (ref 0.44–1.00)
GFR, Estimated: >60 mL/min (ref >60)
Glucose, Bld: 126 mg/dL — ABNORMAL HIGH (ref 70–99)
Potassium: 3.4 mmol/L — ABNORMAL LOW (ref 3.5–5.1)
Sodium: 144 mmol/L (ref 135–145)
Total Bilirubin: 0.4 mg/dL (ref 0.3–1.2)
Total Protein: 7.4 g/dL (ref 6.5–8.1)

## 2020-06-16 ENCOUNTER — Telehealth: Payer: Self-pay | Admitting: Oncology

## 2020-06-16 NOTE — Telephone Encounter (Signed)
Scheduled appt per 4/14 los -mailed letter with appt date and time

## 2020-08-18 ENCOUNTER — Encounter: Payer: Self-pay | Admitting: Oncology

## 2020-10-26 ENCOUNTER — Other Ambulatory Visit: Payer: Self-pay | Admitting: Oncology

## 2020-11-17 ENCOUNTER — Other Ambulatory Visit: Payer: BC Managed Care – PPO

## 2020-11-21 ENCOUNTER — Ambulatory Visit
Admission: RE | Admit: 2020-11-21 | Discharge: 2020-11-21 | Disposition: A | Discharge status: Home / Self Care | Payer: BC Managed Care – PPO | Source: Ambulatory Visit | Attending: Oncology | Admitting: Oncology

## 2020-11-21 ENCOUNTER — Other Ambulatory Visit: Payer: Self-pay | Admitting: Oncology

## 2020-11-21 ENCOUNTER — Other Ambulatory Visit: Payer: BC Managed Care – PPO

## 2020-11-21 DIAGNOSIS — Z1501 Genetic susceptibility to malignant neoplasm of breast: Secondary | ICD-10-CM

## 2020-11-21 DIAGNOSIS — C50412 Malignant neoplasm of upper-outer quadrant of left female breast: Secondary | ICD-10-CM

## 2020-11-21 DIAGNOSIS — Z170 Estrogen receptor positive status [ER+]: Secondary | ICD-10-CM

## 2020-11-21 MED ORDER — GADOBUTROL 1 MMOL/ML IV SOLN
9.0000 mL | Freq: Once | INTRAVENOUS | Status: AC | PRN
  Administered 2020-11-21: 9 mL via INTRAVENOUS

## 2020-11-22 ENCOUNTER — Ambulatory Visit
Admission: RE | Admit: 2020-11-22 | Discharge: 2020-11-22 | Disposition: A | Discharge status: Home / Self Care | Payer: BC Managed Care – PPO | Source: Ambulatory Visit | Attending: Oncology | Admitting: Oncology

## 2020-11-22 ENCOUNTER — Other Ambulatory Visit: Payer: Self-pay

## 2020-11-22 DIAGNOSIS — Z170 Estrogen receptor positive status [ER+]: Secondary | ICD-10-CM

## 2020-11-22 DIAGNOSIS — Z1501 Genetic susceptibility to malignant neoplasm of breast: Secondary | ICD-10-CM

## 2020-11-22 DIAGNOSIS — C50412 Malignant neoplasm of upper-outer quadrant of left female breast: Secondary | ICD-10-CM

## 2020-11-22 NOTE — Progress Notes (Signed)
[place]  [phone] [phone]     ID: Loretta Nelson DOB: Dec 26, 1965  MR#: [id number]  PIR#:[id number]  Patient Care Team: [name], MD as PCP - General (Family Medicine) Mauro Kaufmann, RN as Oncology Nurse Navigator Rockwell Germany, RN as Oncology Nurse Navigator Donnie Mesa, MD as Consulting Physician (General Surgery) Lyrick Worland, Virgie Dad, MD as Consulting Physician (Oncology) Kyung Rudd, MD as Consulting Physician (Radiation Oncology) Delrae Rend, MD as Consulting Physician (Endocrinology) Star Age, MD as Consulting Physician (Neurology) Garvin Fila, MD as Consulting Physician (Neurology) Christophe Louis, MD as Consulting Physician (Obstetrics and Gynecology) Arta Silence, MD as Consulting Physician (Gastroenterology) Chauncey Cruel, MD OTHER MD:   CHIEF COMPLAINT: triple negative breast cancer  CURRENT TREATMENT: anastrozole, intensified screening   INTERVAL HISTORY: Loretta Nelson returns today for follow up of her triple negative breast cancer.  She is accompanied by her daughter Loretta Nelson started anastrozole in 09/2019.  She tolerates this with no major side effects of concern.  She also undergoes intensified screening and since her last visit, she underwent breast MRI on 11/21/2020 showing: breast composition B; showing:  1. 9 mm elongated enhancing mass in the retroareolar left breast, which appears to reside along a lumpectomy scar line. There was linear postoperative fluid with peripheral enhancement in this location on the prior MRI. However, given the current appearance, the finding is suspicious and warrants tissue sampling. 2. No other findings suggestive of recurrent or new malignancy in the left breast. 3. No evidence of right breast malignancy. 4. No evidence of metastatic lymphadenopathy. 5. Left breast post lumpectomy seroma is stable in size.  REVIEW OF SYSTEMS: Loretta Nelson continues to  work full-time.  She walks for exercise and is training for the October 15 5 km walk.  She also has just purchased a treadmill.  She found a little area in the lateral aspect of her left breast which worried her little and she wanted me to palpated.  A detailed review of systems today was otherwise stable.   COVID 19 VACCINATION STATUS: Has received Pfizer x4 as of September 2022  HISTORY OF CURRENT ILLNESS: From the original intake note:  Loretta Nelson had routine screening mammography on 03/19/2019 showing a possible abnormality in the left breast. She underwent left diagnostic mammography with tomography and left breast ultrasonography at [facility] on 03/29/2019 showing: breast density category B; 8 mm mass in left breast at 12 o'clock; left axilla negative for lymphadenopathy.  Accordingly on 04/03/2019 she proceeded to biopsy of the left breast area in question. The pathology from this procedure ([id number]) showed: invasive mammary carcinoma, grade 3, e-cadherin positive. Prognostic indicators significant for: estrogen receptor, 10% positive with weak staining intensity and progesterone receptor, 0% negative. Proliferation marker Ki67 at 30%. HER2 negative by immunohistochemistry (1+).  The patient's subsequent history is as detailed below.   PAST MEDICAL HISTORY: Past Medical History:  Diagnosis Date   Allergic rhinitis    BARD1 gene mutation positive 05/21/5571   Complication of anesthesia    woke up during EGD   Dysphagia    Empty sella syndrome    Essential hypertension    Family history of breast cancer    Fibroids, intramural 02/17/2017   Gastroesophageal reflux disease    Generalized anxiety disorder    Graves disease 01/13/11   radioactive iodine treatment, 22.0 millicuries   Hiatal hernia    small   History of chemotherapy    History of hysterectomy  02/17/2017   History of migraine headaches    History of radiation therapy    Iron deficiency anemia     Malignant neoplasm of upper-outer quadrant of left breast in female, 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   Meralgia paresthetica    Murmur    benign, h/o, caused by hyperdynamic contraction   Obesity    Obstructive sleep apnea    Pneumonia 2017   inhaler prescribed   Postoperative hypothyroidism    Primary osteoarthritis of right foot 03/12/2018   Tendonitis of shoulder, right    Vitamin D deficiency     PAST SURGICAL HISTORY: Past Surgical History:  Procedure Laterality Date   ABDOMINAL HYSTERECTOMY  2018   BALLOON DILATION N/A 09/07/2016   Procedure: BALLOON DILATION;  Surgeon: Arta Silence, MD;  Location: [facility];  Service: Endoscopy;  Laterality: N/A;   BREAST LUMPECTOMY Left 03/2019   BREAST LUMPECTOMY WITH RADIOACTIVE SEED AND SENTINEL LYMPH NODE BIOPSY Left 04/17/2019   Procedure: LEFT BREAST LUMPECTOMY WITH RADIOACTIVE SEED AND SENTINEL LYMPH NODE BIOPSY;  Surgeon: Donnie Mesa, MD;  Location: [place];  Service: General;  Laterality: Left;  LMA, PEC BLOCK   CESAREAN SECTION  1999   COLONOSCOPY     DILATION AND CURETTAGE OF UTERUS     ESOPHAGOGASTRODUODENOSCOPY     ESOPHAGOGASTRODUODENOSCOPY (EGD) WITH PROPOFOL N/A 09/07/2016   Procedure: ESOPHAGOGASTRODUODENOSCOPY (EGD) WITH PROPOFOL;  Surgeon: Arta Silence, MD;  Location: [facility];  Service: Endoscopy;  Laterality: N/A;   HYSTERECTOMY ABDOMINAL WITH SALPINGECTOMY Bilateral 02/17/2017   Procedure: HYSTERECTOMY ABDOMINAL WITH SALPINGECTOMY;  Surgeon: Christophe Louis, MD;  Location: [facility];  Service: Gynecology;  Laterality: Bilateral;   KNEE ARTHROSCOPY     for torn meniscus   LAPAROSCOPIC SALPINGO OOPHERECTOMY N/A 02/12/2020   Procedure: DIAGNOSTIC LAPAROSCOPY ;  Surgeon: Christophe Louis, MD;  Location: [facility];  Service: Gynecology;  Laterality: N/A;   LAPAROTOMY Bilateral 02/12/2020   Procedure: EXPLORATORY LAPAROTOMY WITH BILATERAL SALPINGO OOPHORECTOMY;  Surgeon: Christophe Louis, MD;  Location: [facility];  Service: Gynecology;   Laterality: Bilateral;   LYSIS OF ADHESION N/A 02/12/2020   Procedure: LYSIS OF ADHESION;  Surgeon: Christophe Louis, MD;  Location: [facility];  Service: Gynecology;  Laterality: N/A;   PORT-A-CATH REMOVAL Right 01/07/2020   Procedure: REMOVAL PORT-A-CATH;  Surgeon: Donnie Mesa, MD;  Location: [facility];  Service: General;  Laterality: Right;   PORTACATH PLACEMENT Right 05/22/2019   Procedure: INSERTION PORT-A-CATH WITH ULTRASOUND GUIDANCE;  Surgeon: Donnie Mesa, MD;  Location: [facility];  Service: General;  Laterality: Right;  Endotrachial tube   WISDOM TOOTH EXTRACTION     WISDOM TOOTH EXTRACTION      FAMILY HISTORY: Family History  Problem Relation Age of Onset   Hypertension Mother    Alcoholism Father    CAD Maternal Grandfather    Breast cancer Paternal Grandmother    HIV Brother    Heart attack Brother        sudden MI vs PE   Breast cancer Sister 1   Breast cancer Other        one dx 55s, others dx in 38s   Alzheimer's disease Other    Colon polyps Neg Hx    Colon cancer Neg Hx    Liver disease Neg Hx   Patient's father was in his late 76s when he died from causes unknown to the patient.  Patient's mother is 17 years old (as of 04/2019). The patient denies a family hx of ovarian cancer. She reports breast cancer  in her sister at age 68, in her paternal grandmother, and in 3 paternal cousins. She had 4 siblings, 2 brothers and 2 sisters, though only her sisters are still living.   GYNECOLOGIC HISTORY:  Patient's last menstrual period was 02/13/2017 (exact date). Menarche: 55 years old Age at first live birth: 55 years old [place] P 2 (twins) LMP 01/2017 Contraceptive: used from 1937-9024 without complication HRT never used  Hysterectomy? Yes, 01/2017, benign pathology BSO? 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 She is divorced. She lives at home with her twin daughters,  Loretta Nelson and Loretta Nelson, who are 55 year old college graduate is currently working for [employer].Loretta Nelson studied music and communications at [employer].Loretta Nelson graduated in IT.  The patient's mother in her late 64s also lives with them.  One of the patient's daughter was found to be positive for the mutation the other negative.   ADVANCED DIRECTIVES: Not in place; the patient intends to name her sister Loretta Nelson as her HCPOA [phone])   HEALTH MAINTENANCE: Social History   Tobacco Use   Smoking status: Never   Smokeless tobacco: Never  Vaping Use   Vaping Use: Never used  Substance Use Topics   Alcohol use: No   Drug use: No     Colonoscopy: at age 7  PAP: 02/2018  Bone density: never done   Allergies  Allergen Reactions   Tape Itching and Other (See Comments)    Adhesive tape--redness/inflammation/itching    Current Outpatient Medications  Medication Sig Dispense Refill   acetaminophen (TYLENOL) 500 MG tablet Take 2 tablets (1,000 mg total) by mouth every 8 (eight) hours as needed. 30 tablet 0   amLODipine (NORVASC) 5 MG tablet Take 5 mg by mouth daily.   5   anastrozole (ARIMIDEX) 1 MG tablet TAKE 1 TABLET BY MOUTH EVERY DAY 90 tablet 4   Cholecalciferol 4000 UNITS CAPS Take 4,000 Units by mouth daily.      citalopram (CELEXA) 40 MG tablet Take 40 mg by mouth daily.     diclofenac Sodium (VOLTAREN) 1 % GEL Apply 1 application topically 4 (four) times daily as needed (knee pain.).     EDARBYCLOR 40-25 MG TABS Take 1 tablet by mouth daily.  5   ibuprofen (ADVIL) 800 MG tablet Take 1 tablet (800 mg total) by mouth every 8 (eight) hours as needed. 30 tablet 0   L-Methylfolate-B12-B6-B2 (CEREFOLIN) 07-29-48-5 MG TABS Take 1 tablet by mouth every morning. 90 tablet 3   levothyroxine (SYNTHROID) 175 MCG tablet Take 175 mcg by mouth daily before breakfast.   5   loratadine (CLARITIN) 10 MG tablet TAKE 1 TABLET BY MOUTH EVERY DAY 90 tablet 1   metoprolol (TOPROL-XL) 200 MG 24 hr  tablet Take 200 mg by mouth daily.   5   pantoprazole (PROTONIX) 40 MG tablet Take 40 mg by mouth 2 (two) times daily.      potassium chloride (KLOR-CON) 20 MEQ packet Take 40 mEq by mouth daily.      No current facility-administered medications for this visit.    OBJECTIVE: African-American woman in no acute distress  Vitals:   11/23/20 1150  BP: (!) 145/71  Pulse: 65  Resp: 18  Temp: 97.8 F (36.6 C)  SpO2: 98%     Body mass index is 52.89 kg/m.   Wt Readings from Last 3 Encounters:  11/23/20 (!) 308 lb 1.6  oz (139.8 kg)  06/11/20 (!) 302 lb (137 kg)  02/12/20 296 lb 4.8 oz (134.4 kg)      ECOG FS:1 - Symptomatic but completely ambulatory  Sclerae unicteric, EOMs intact Wearing a mask No cervical or supraclavicular adenopathy Lungs no rales or rhonchi Heart regular rate and rhythm Abd soft, nontender, positive bowel sounds MSK no focal spinal tenderness, no upper extremity lymphedema Neuro: nonfocal, well oriented, appropriate affect Breasts: The right breast is benign.  The left breast is status postlumpectomy and radiation.  Aside from mild treatment expected changes there is a palpable seroma in the superior aspect of the breast.  I do not palpate a mass in the retroareolar area and there are no nipple changes of concern.  The area in the lateral breast where the patient feels a slight difference does find a slight difference but it is clearly not cancer and may represent an area of fat change.   LAB RESULTS:  CMP     Component Value Date/Time   NA 143 11/23/2020 1140   K 3.8 11/23/2020 1140   CL 106 11/23/2020 1140   CO2 28 11/23/2020 1140   GLUCOSE 106 (H) 11/23/2020 1140   BUN 21 (H) 11/23/2020 1140   CREATININE 0.80 11/23/2020 1140   CREATININE 0.83 04/10/2019 1240   CALCIUM 9.8 11/23/2020 1140   PROT 7.5 11/23/2020 1140   ALBUMIN 3.9 11/23/2020 1140   AST 19 11/23/2020 1140   AST 17 04/10/2019 1240   ALT 17 11/23/2020 1140   ALT 19 04/10/2019 1240    ALKPHOS 83 11/23/2020 1140   BILITOT 0.3 11/23/2020 1140   BILITOT 0.4 04/10/2019 1240   GFRNONAA >60 11/23/2020 1140   GFRNONAA >60 04/10/2019 1240   GFRAA >60 10/24/2019 1323   GFRAA >60 04/10/2019 1240    No results found for: TOTALPROTELP, ALBUMINELP, A1GS, A2GS, BETS, BETA2SER, GAMS, MSPIKE, SPEI  Lab Results  Component Value Date   WBC 5.3 11/23/2020   NEUTROABS 3.0 11/23/2020   HGB 11.8 (L) 11/23/2020   HCT 35.8 (L) 11/23/2020   MCV 82.1 11/23/2020   PLT 183 11/23/2020    No results found for: LABCA2  No components found for: [id number]  No results for input(s): INR in the last 168 hours.  No results found for: LABCA2  No results found for: XHF414  No results found for: ELT532  No results found for: YEB343  No results found for: CA2729  No components found for: HGQUANT  No results found for: CEA1 / No results found for: CEA1   No results found for: AFPTUMOR  No results found for: CHROMOGRNA  No results found for: KPAFRELGTCHN, LAMBDASER, KAPLAMBRATIO (kappa/lambda light chains)  No results found for: HGBA, HGBA2QUANT, HGBFQUANT, HGBSQUAN (Hemoglobinopathy evaluation)   No results found for: LDH  No results found for: IRON, TIBC, IRONPCTSAT (Iron and TIBC)  No results found for: FERRITIN  Urinalysis No results found for: COLORURINE, APPEARANCEUR, LABSPEC, PHURINE, GLUCOSEU, HGBUR, BILIRUBINUR, KETONESUR, PROTEINUR, UROBILINOGEN, NITRITE, LEUKOCYTESUR   STUDIES: MR BREAST BILATERAL [place] CAD  Result Date: 11/23/2020 CLINICAL DATA:  Left breast cancer positiveEstrogen recep positiveLeft breast biopsy 12/2017- positive for cancerLumpectomy left breast 2/2021Chemo left breast 3/2021New symptoms above 3 o'clock area feels lump for about a monthSister hx BC @ 51GMA hx BC not sure what age LABS:  No labs drawn at time of imaging EXAM: BILATERAL BREAST MRI WITH AND WITHOUT CONTRAST TECHNIQUE: Multiplanar, multisequence MR images of both  breasts were obtained prior to  and following the intravenous administration of 9 ml of Gadavist Three-dimensional MR images were rendered by post-processing of the original MR data on an independent workstation. The three-dimensional MR images were interpreted, and findings are reported in the following complete MRI report for this study. Three dimensional images were evaluated at the independent interpreting workstation using the DynaCAD thin client. COMPARISON:  Prior exams including the previous breast MRI dated 11/13/2019. FINDINGS: Breast composition: b. Scattered fibroglandular tissue. Background parenchymal enhancement: Mild Right breast: No mass or abnormal enhancement. Left breast: Small elongated enhancing mass lies anteriorly, in the retroareolar breast, measuring 9 mm in long axis. This appears to reside along a lumpectomy scar. In the central breast there is a post lumpectomy seroma, with thin peripheral enhancement, measuring 6.7 x 4.3 x 4.8 cm. There is no suspicious enhancement along the lumpectomy bed or surrounding the seroma. Right breast trabecular and skin thickening and edema has significantly improved from the prior breast MRI. Lymph nodes: No abnormal appearing lymph nodes. Ancillary findings:  None. IMPRESSION: 1. 9 mm elongated enhancing mass in the retroareolar left breast, which appears to reside along a lumpectomy scar line. There was linear postoperative fluid with peripheral enhancement in this location on the prior MRI. However, given the current appearance, the finding is suspicious and warrants tissue sampling. 2. No other findings suggestive of recurrent or new malignancy in the left breast. 3. No evidence of right breast malignancy. 4. No evidence of metastatic lymphadenopathy. 5. Left breast post lumpectomy seroma is stable in size. RECOMMENDATION: 1. MRI guided core needle biopsy of the small enhancing mass in the retroareolar left breast. BI-RADS CATEGORY  4: Suspicious.  Electronically Signed   By: Lajean Manes M.D.   On: 11/23/2020 10:20     ELIGIBLE FOR AVAILABLE RESEARCH PROTOCOL:[id number]?--Depending on final surgical pathology  ASSESSMENT: 55 y.o. [place] woman status post left breast upper outer quadrant biopsy 04/03/2019 for a clinical T1b N0, stage IB invasive ductal carcinoma, grade 3, functionally triple negative (estrogen receptor is weakly positive at 10%, progesterone receptor negative, HER-2 not amplified), with an MIB-1 of 30%  (1) status post left lumpectomy and sentinel lymph node sampling 04/17/2019 for a pT1c pN0, stage IB invasive ductal carcinoma, grade 3, triple negative, with negative margins  (a) a total of 2 sentinel lymph nodes were removed  (2) adjuvant chemotherapy consisting of cyclophosphamide and docetaxel every 21 days x 4 started 05/23/2019, completed 07/25/2019 with no dose reductions or delays  (3) adjuvant radiation 08/13/2019 - 09/27/2019  (a) left breast / 50.4 Gy in 28 fractions  (b) seroma boost / 10 Gy in 5 fractions  (4) genetics testing 04/19/2019 through the [facility] Breast Cancer STAT Panel + Common Hereditary Cancers Panel 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 STK11, TP53, TSC1, TSC2, and VHL.  The following genes were evaluated for sequence changes only: SDHA and HOXB13 c.251G>A variant only.  (b) current data suggests an increase risk of breast and  ovarian cancer in patients carrying a BARD1 mutation, but  data is preliminary and insufficient for definitive recommendations  (5) prophylactic anastrozole started 10/24/2019  (6) intensified screening:  breast MRI September, mammography March on a yearly basis  (7) s/p bilateral salpingo-oophorectomy 02/12/2020 with benign pathology   PLAN: Roselina is now a little over a year and a half out from definitive surgery for her breast cancer with no evidence of disease recurrence.  This is favorable.  She is tolerating anastrozole well and the plan is to continue that a minimum of 5 years.  I think what we are seeing in the left breast and the most recent MRI is going to be benign but the only way to really tell of course is to biopsy it.  I reviewed the images with her.  I have set her up for an ultrasound biopsy hoping it can be done through this modality but she understands she might need an MRI biopsy if this is not well visualized by ultrasound.  I do think this will be benign and I reassured her regarding that.  I have set her up for a virtual visit with me in about 5 weeks just to discuss these results.  Total encounter time 25 minutes.   Virgie Dad. Sarinity Dicicco, MD 11/23/20 12:21 PM Medical Oncology and Hematology [facility] [place], [place] [address] Tel. [phone]    Fax. [phone]   I, Wilburn Mylar, am acting as scribe for Dr. Virgie Dad. Afomia Blackley.  I, Lurline Del MD, have reviewed the above documentation for accuracy and completeness, and I agree with the above.   *Total Encounter Time as defined by the Centers for Medicare and Medicaid Services includes, in addition to the face-to-face time of a patient visit (documented in the note above) non-face-to-face time: obtaining and reviewing outside history, ordering and reviewing medications, tests or procedures, care coordination (communications with other health care professionals or  caregivers) and documentation in the medical record.

## 2020-11-23 ENCOUNTER — Inpatient Hospital Stay: Payer: BC Managed Care – PPO

## 2020-11-23 ENCOUNTER — Inpatient Hospital Stay: Payer: BC Managed Care – PPO | Attending: Oncology | Admitting: Oncology

## 2020-11-23 ENCOUNTER — Other Ambulatory Visit: Payer: Self-pay

## 2020-11-23 VITALS — BP 145/71 | HR 65 | Temp 97.8°F | Resp 18 | Ht 64.0 in | Wt 308.1 lb

## 2020-11-23 DIAGNOSIS — C50412 Malignant neoplasm of upper-outer quadrant of left female breast: Secondary | ICD-10-CM

## 2020-11-23 DIAGNOSIS — Z923 Personal history of irradiation: Secondary | ICD-10-CM | POA: Diagnosis not present

## 2020-11-23 DIAGNOSIS — Z9221 Personal history of antineoplastic chemotherapy: Secondary | ICD-10-CM | POA: Diagnosis not present

## 2020-11-23 DIAGNOSIS — Z171 Estrogen receptor negative status [ER-]: Secondary | ICD-10-CM | POA: Insufficient documentation

## 2020-11-23 DIAGNOSIS — Z79811 Long term (current) use of aromatase inhibitors: Secondary | ICD-10-CM | POA: Diagnosis not present

## 2020-11-23 DIAGNOSIS — C50919 Malignant neoplasm of unspecified site of unspecified female breast: Secondary | ICD-10-CM | POA: Diagnosis present

## 2020-11-23 DIAGNOSIS — Z170 Estrogen receptor positive status [ER+]: Secondary | ICD-10-CM | POA: Diagnosis not present

## 2020-11-23 DIAGNOSIS — Z90710 Acquired absence of both cervix and uterus: Secondary | ICD-10-CM

## 2020-11-23 DIAGNOSIS — Z1501 Genetic susceptibility to malignant neoplasm of breast: Secondary | ICD-10-CM

## 2020-11-23 LAB — COMPREHENSIVE METABOLIC PANEL
ALT: 17 U/L (ref 0–44)
AST: 19 U/L (ref 15–41)
Albumin: 3.9 g/dL (ref 3.5–5.0)
Alkaline Phosphatase: 83 U/L (ref 38–126)
Anion gap: 9 (ref 5–15)
BUN: 21 mg/dL — ABNORMAL HIGH (ref 6–20)
CO2: 28 mmol/L (ref 22–32)
Calcium: 9.8 mg/dL (ref 8.9–10.3)
Chloride: 106 mmol/L (ref 98–111)
Creatinine, Ser: 0.8 mg/dL (ref 0.44–1.00)
GFR, Estimated: >60 mL/min (ref >60)
Glucose, Bld: 106 mg/dL — ABNORMAL HIGH (ref 70–99)
Potassium: 3.8 mmol/L (ref 3.5–5.1)
Sodium: 143 mmol/L (ref 135–145)
Total Bilirubin: 0.3 mg/dL (ref 0.3–1.2)
Total Protein: 7.5 g/dL (ref 6.5–8.1)

## 2020-11-23 LAB — CBC WITH DIFFERENTIAL/PLATELET
Abs Immature Granulocytes: 0.02 10*3/uL (ref 0.00–0.07)
Basophils Absolute: 0 10*3/uL (ref 0.0–0.1)
Basophils Relative: 1 %
Eosinophils Absolute: 0.2 10*3/uL (ref 0.0–0.5)
Eosinophils Relative: 3 %
HCT: 35.8 % — ABNORMAL LOW (ref 36.0–46.0)
Hemoglobin: 11.8 g/dL — ABNORMAL LOW (ref 12.0–15.0)
Immature Granulocytes: 0 %
Lymphocytes Relative: 33 %
Lymphs Abs: 1.8 10*3/uL (ref 0.7–4.0)
MCH: 27.1 pg (ref 26.0–34.0)
MCHC: 33 g/dL (ref 30.0–36.0)
MCV: 82.1 fL (ref 80.0–100.0)
Monocytes Absolute: 0.3 10*3/uL (ref 0.1–1.0)
Monocytes Relative: 6 %
Neutro Abs: 3 10*3/uL (ref 1.7–7.7)
Neutrophils Relative %: 57 %
Platelets: 183 10*3/uL (ref 150–400)
RBC: 4.36 MIL/uL (ref 3.87–5.11)
RDW: 13.4 % (ref 11.5–15.5)
WBC: 5.3 10*3/uL (ref 4.0–10.5)
nRBC: 0 % (ref 0.0–0.2)

## 2020-11-25 ENCOUNTER — Other Ambulatory Visit: Payer: Self-pay | Admitting: Oncology

## 2020-11-25 DIAGNOSIS — N63 Unspecified lump in unspecified breast: Secondary | ICD-10-CM

## 2020-11-29 NOTE — Progress Notes (Signed)
I agree with the above plan 

## 2020-12-01 ENCOUNTER — Other Ambulatory Visit: Payer: Self-pay | Admitting: Oncology

## 2020-12-01 DIAGNOSIS — Z170 Estrogen receptor positive status [ER+]: Secondary | ICD-10-CM

## 2020-12-01 DIAGNOSIS — C50412 Malignant neoplasm of upper-outer quadrant of left female breast: Secondary | ICD-10-CM

## 2020-12-02 ENCOUNTER — Other Ambulatory Visit: Payer: Self-pay | Admitting: Oncology

## 2020-12-02 DIAGNOSIS — C50412 Malignant neoplasm of upper-outer quadrant of left female breast: Secondary | ICD-10-CM

## 2020-12-07 ENCOUNTER — Other Ambulatory Visit: Payer: BC Managed Care – PPO

## 2020-12-07 ENCOUNTER — Encounter: Payer: Self-pay | Admitting: Oncology

## 2020-12-21 ENCOUNTER — Ambulatory Visit
Admission: RE | Admit: 2020-12-21 | Discharge: 2020-12-21 | Disposition: A | Discharge status: Home / Self Care | Payer: BC Managed Care – PPO | Source: Ambulatory Visit | Attending: Oncology | Admitting: Oncology

## 2020-12-21 ENCOUNTER — Other Ambulatory Visit: Payer: Self-pay | Admitting: Oncology

## 2020-12-21 ENCOUNTER — Other Ambulatory Visit: Payer: Self-pay

## 2020-12-21 ENCOUNTER — Encounter: Payer: Self-pay | Admitting: Oncology

## 2020-12-21 DIAGNOSIS — Z170 Estrogen receptor positive status [ER+]: Secondary | ICD-10-CM

## 2020-12-21 DIAGNOSIS — C50412 Malignant neoplasm of upper-outer quadrant of left female breast: Secondary | ICD-10-CM

## 2020-12-21 DIAGNOSIS — R9389 Abnormal findings on diagnostic imaging of other specified body structures: Secondary | ICD-10-CM

## 2020-12-22 ENCOUNTER — Other Ambulatory Visit: Payer: Self-pay | Admitting: Family Medicine

## 2020-12-22 ENCOUNTER — Ambulatory Visit
Admission: RE | Admit: 2020-12-22 | Discharge: 2020-12-22 | Disposition: A | Discharge status: Home / Self Care | Payer: BC Managed Care – PPO | Source: Ambulatory Visit | Attending: Family Medicine | Admitting: Family Medicine

## 2020-12-22 DIAGNOSIS — U099 Post covid-19 condition, unspecified: Secondary | ICD-10-CM

## 2020-12-24 ENCOUNTER — Other Ambulatory Visit (HOSPITAL_COMMUNITY): Payer: Self-pay | Admitting: Diagnostic Radiology

## 2020-12-24 ENCOUNTER — Other Ambulatory Visit: Payer: Self-pay

## 2020-12-24 ENCOUNTER — Ambulatory Visit
Admission: RE | Admit: 2020-12-24 | Discharge: 2020-12-24 | Disposition: A | Discharge status: Home / Self Care | Payer: BC Managed Care – PPO | Source: Ambulatory Visit | Attending: Oncology | Admitting: Oncology

## 2020-12-24 DIAGNOSIS — R9389 Abnormal findings on diagnostic imaging of other specified body structures: Secondary | ICD-10-CM

## 2020-12-24 DIAGNOSIS — N63 Unspecified lump in unspecified breast: Secondary | ICD-10-CM

## 2020-12-24 MED ORDER — GADOBUTROL 1 MMOL/ML IV SOLN
10.0000 mL | Freq: Once | INTRAVENOUS | Status: AC | PRN
  Administered 2020-12-24: 10 mL via INTRAVENOUS

## 2021-01-10 NOTE — Progress Notes (Signed)
[place]  Telephone:([phone] Fax:([phone]    ID: Loretta Nelson DOB: Sep 17, 1965  MR#: [id number]  EXB#:[id number]  Patient Care Team: Loretta Stains, MD as PCP - General (Family Medicine) Loretta Kaufmann, RN as Oncology Nurse Navigator Loretta Germany, RN as Oncology Nurse Navigator Loretta Mesa, MD as Consulting Physician (General Surgery) Loretta Nelson, Loretta Dad, MD as Consulting Physician (Oncology) Loretta Rudd, MD as Consulting Physician (Radiation Oncology) Loretta Rend, MD as Consulting Physician (Endocrinology) Loretta Age, MD as Consulting Physician (Neurology) Loretta Fila, MD as Consulting Physician (Neurology) Loretta Louis, MD as Consulting Physician (Obstetrics and Gynecology) Loretta Silence, MD as Consulting Physician (Gastroenterology) Loretta Cruel, MD OTHER MD:  I connected with Loretta Nelson on 01/11/21 at  2:00 PM EST by video enabled telemedicine visit and verified that I am speaking with the correct person using two identifiers.   I discussed the limitations, risks, security and privacy concerns of performing an evaluation and management service by telemedicine and the availability of in-person appointments. I also discussed with the patient that there may be a patient responsible charge related to this service. The patient expressed understanding and agreed to proceed.   Other persons participating in the visit and their role in the encounter: None  Patient's location: Work Provider's location: [facility]   I provided 20 minutes of face-to-face video visit time during this encounter, and > 50% was spent counseling as documented under my assessment & plan.   CHIEF COMPLAINT: triple negative breast cancer  CURRENT TREATMENT: anastrozole, intensified screening   INTERVAL HISTORY: Loretta Nelson was contacted today for follow up of her triple negative breast cancer.  She is accompanied by her daughter  Loretta Nelson  Since her last visit, she underwent second-look left breast ultrasound on 12/21/2020 showing: no sonographic correlate to recent MRI findings in retroareolar left breast.  She proceeded to MRI-guided biopsy of the left breast area in question on 12/24/2020. Pathology from the procedure ([id number]) showed fat necrosis.  Loretta Nelson started anastrozole in 09/2019.  She tolerates this with no major side effects of concern.   REVIEW OF SYSTEMS: Loretta Nelson had a fairly "bad" episode of COVID October 2022.  She was treated with pack Slo-Bid.  Despite not feeling very well she participated in the [place] for breast cancer.  She was not able quite to completed but she was very motivated.  She is also starting a regular diet and weight loss program.  Of course she is back to work.  A detailed review of systems was otherwise stable   COVID 19 VACCINATION STATUS: Has received Pfizer x4 as of September 2022; had COVID October 2022   HISTORY OF CURRENT ILLNESS: From the original intake note:  Loretta Nelson had routine screening mammography on 03/19/2019 showing a possible abnormality in the left breast. She underwent left diagnostic mammography with tomography and left breast ultrasonography at [facility] on 03/29/2019 showing: breast density category B; 8 mm mass in left breast at 12 o'clock; left axilla negative for lymphadenopathy.  Accordingly on 04/03/2019 she proceeded to biopsy of the left breast area in question. The pathology from this procedure ([id number]) showed: invasive mammary carcinoma, grade 3, e-cadherin positive. Prognostic indicators significant for: estrogen receptor, 10% positive with weak staining intensity and progesterone receptor, 0% negative. Proliferation marker Ki67 at 30%. HER2 negative by immunohistochemistry (1+).  The patient's subsequent history is as detailed below.   PAST MEDICAL HISTORY: Past Medical History:  Diagnosis Date  Allergic rhinitis    BARD1 gene  mutation positive 6/60/6301   Complication of anesthesia    woke up during EGD   Dysphagia    Empty sella syndrome    Essential hypertension    Family history of breast cancer    Fibroids, intramural 02/17/2017   Gastroesophageal reflux disease    Generalized anxiety disorder    Graves disease 01/13/11   radioactive iodine treatment, 60.1 millicuries   Hiatal hernia    small   History of chemotherapy    History of hysterectomy 02/17/2017   History of migraine headaches    History of radiation therapy    Iron deficiency anemia    Malignant neoplasm of upper-outer quadrant of left breast in female, 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   Meralgia paresthetica    Murmur    benign, h/o, caused by hyperdynamic contraction   Obesity    Obstructive sleep apnea    Pneumonia 2017   inhaler prescribed   Postoperative hypothyroidism    Primary osteoarthritis of right foot 03/12/2018   Tendonitis of shoulder, right    Vitamin D deficiency     PAST SURGICAL HISTORY: Past Surgical History:  Procedure Laterality Date   ABDOMINAL HYSTERECTOMY  2018   BALLOON DILATION N/A 09/07/2016   Procedure: BALLOON DILATION;  Surgeon: Loretta Silence, MD;  Location: [place];  Service: Endoscopy;  Laterality: N/A;   BREAST LUMPECTOMY Left 03/2019   BREAST LUMPECTOMY WITH RADIOACTIVE SEED AND SENTINEL LYMPH NODE BIOPSY Left 04/17/2019   Procedure: LEFT BREAST LUMPECTOMY WITH RADIOACTIVE SEED AND SENTINEL LYMPH NODE BIOPSY;  Surgeon: Loretta Mesa, MD;  Location: [place];  Service: General;  Laterality: Left;  LMA, PEC BLOCK   CESAREAN SECTION  1999   COLONOSCOPY     DILATION AND CURETTAGE OF UTERUS     ESOPHAGOGASTRODUODENOSCOPY     ESOPHAGOGASTRODUODENOSCOPY (EGD) WITH PROPOFOL N/A 09/07/2016   Procedure: ESOPHAGOGASTRODUODENOSCOPY (EGD) WITH PROPOFOL;  Surgeon: Loretta Silence, MD;  Location: [place];  Service: Endoscopy;  Laterality: N/A;   HYSTERECTOMY ABDOMINAL WITH SALPINGECTOMY Bilateral  02/17/2017   Procedure: HYSTERECTOMY ABDOMINAL WITH SALPINGECTOMY;  Surgeon: Loretta Louis, MD;  Location: [facility];  Service: Gynecology;  Laterality: Bilateral;   KNEE ARTHROSCOPY     for torn meniscus   LAPAROSCOPIC SALPINGO OOPHERECTOMY N/A 02/12/2020   Procedure: DIAGNOSTIC LAPAROSCOPY ;  Surgeon: Loretta Louis, MD;  Location: [place];  Service: Gynecology;  Laterality: N/A;   LAPAROTOMY Bilateral 02/12/2020   Procedure: EXPLORATORY LAPAROTOMY WITH BILATERAL SALPINGO OOPHORECTOMY;  Surgeon: Loretta Louis, MD;  Location: [place];  Service: Gynecology;  Laterality: Bilateral;   LYSIS OF ADHESION N/A 02/12/2020   Procedure: LYSIS OF ADHESION;  Surgeon: Loretta Louis, MD;  Location: [place];  Service: Gynecology;  Laterality: N/A;   PORT-A-CATH REMOVAL Right 01/07/2020   Procedure: REMOVAL PORT-A-CATH;  Surgeon: Loretta Mesa, MD;  Location: [place];  Service: General;  Laterality: Right;   PORTACATH PLACEMENT Right 05/22/2019   Procedure: INSERTION PORT-A-CATH WITH ULTRASOUND GUIDANCE;  Surgeon: Loretta Mesa, MD;  Location: [facility];  Service: General;  Laterality: Right;  Endotrachial tube   WISDOM TOOTH EXTRACTION     WISDOM TOOTH EXTRACTION      FAMILY HISTORY: Family History  Problem Relation Nelson of Onset   Hypertension Mother    Alcoholism Father    CAD Maternal Grandfather    Breast cancer Paternal Grandmother    HIV Brother    Heart attack Brother        sudden MI vs  PE   Breast cancer Sister 45   Breast cancer Other        one dx 6s, others dx in 94s   Alzheimer's disease Other    Colon polyps Neg Hx    Colon cancer Neg Hx    Liver disease Neg Hx   Patient's father was in his late 70s when he died from causes unknown to the patient.  Patient's mother is 65 years old (as of 04/2019). The patient denies a family hx of ovarian cancer. She reports breast cancer in her sister at Nelson 32, in her paternal grandmother, and in 3 paternal cousins. She had 4 siblings, 2 brothers and 2 sisters, though only  her sisters are still living.   GYNECOLOGIC HISTORY:  Patient's last menstrual period was 02/13/2017 (exact date). Menarche: 55 years old Nelson at first live birth: 55 years old [place] P 2 (twins) LMP 01/2017 Contraceptive: used from 4401-0272 without complication HRT never used  Hysterectomy? Yes, 01/2017, benign pathology BSO? 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 She is divorced. She lives at home with her twin daughters, Loretta Nelson and Loretta Nelson, who are 55 year old college graduate is currently working for [employer].Loretta Nelson studied music and communications at [employer].Loretta Nelson graduated in IT.  The patient's mother in her late 25s also lives with them.  One of the patient's daughter was found to be positive for the mutation the other negative.   ADVANCED DIRECTIVES: Not in place; the patient intends to name her sister Loretta Nelson as her HCPOA [phone])   HEALTH MAINTENANCE: Social History   Tobacco Use   Smoking status: Never   Smokeless tobacco: Never  Vaping Use   Vaping Use: Never used  Substance Use Topics   Alcohol use: No   Drug use: No     Colonoscopy: at Nelson 48  PAP: 02/2018  Bone density: never done   Allergies  Allergen Reactions   Tape Itching and Other (See Comments)    Adhesive tape--redness/inflammation/itching    Current Outpatient Medications  Medication Sig Dispense Refill   acetaminophen (TYLENOL) 500 MG tablet Take 2 tablets (1,000 mg total) by mouth every 8 (eight) hours as needed. 30 tablet 0   amLODipine (NORVASC) 5 MG tablet Take 5 mg by mouth daily.   5   anastrozole (ARIMIDEX) 1 MG tablet TAKE 1 TABLET BY MOUTH EVERY DAY 90 tablet 4   Cholecalciferol 4000 UNITS CAPS Take 4,000 Units by mouth daily.      citalopram (CELEXA) 40 MG tablet Take 40 mg by mouth daily.     diclofenac Sodium (VOLTAREN) 1 % GEL Apply 1 application  topically 4 (four) times daily as needed (knee pain.).     EDARBYCLOR 40-25 MG TABS Take 1 tablet by mouth daily.  5   ibuprofen (ADVIL) 800 MG tablet Take 1 tablet (800 mg total) by mouth every 8 (eight) hours as needed. 30 tablet 0   L-Methylfolate-B12-B6-B2 (CEREFOLIN) 07-29-48-5 MG TABS Take 1 tablet by mouth every morning. 90 tablet 3   levothyroxine (SYNTHROID) 175 MCG tablet Take 175 mcg by mouth daily before breakfast.   5   loratadine (CLARITIN) 10 MG tablet TAKE 1 TABLET BY MOUTH EVERY DAY 90 tablet 1   metoprolol (TOPROL-XL) 200 MG 24 hr tablet Take 200 mg by mouth daily.   5   pantoprazole (PROTONIX) 40 MG tablet Take 40 mg by  mouth 2 (two) times daily.      potassium chloride (KLOR-CON) 20 MEQ packet Take 40 mEq by mouth daily.      No current facility-administered medications for this visit.    OBJECTIVE: African-American woman who appears well  There were no vitals filed for this visit.    There is no height or weight on file to calculate BMI.   Wt Readings from Last 3 Encounters:  11/23/20 (!) 308 lb 1.6 oz (139.8 kg)  06/11/20 (!) 302 lb (137 kg)  02/12/20 296 lb 4.8 oz (134.4 kg)      ECOG FS:1 - Symptomatic but completely ambulatory  Telemedicine visit 01/11/2021   LAB RESULTS:  CMP     Component Value Date/Time   NA 143 11/23/2020 1140   K 3.8 11/23/2020 1140   CL 106 11/23/2020 1140   CO2 28 11/23/2020 1140   GLUCOSE 106 (H) 11/23/2020 1140   BUN 21 (H) 11/23/2020 1140   CREATININE 0.80 11/23/2020 1140   CREATININE 0.83 04/10/2019 1240   CALCIUM 9.8 11/23/2020 1140   PROT 7.5 11/23/2020 1140   ALBUMIN 3.9 11/23/2020 1140   AST 19 11/23/2020 1140   AST 17 04/10/2019 1240   ALT 17 11/23/2020 1140   ALT 19 04/10/2019 1240   ALKPHOS 83 11/23/2020 1140   BILITOT 0.3 11/23/2020 1140   BILITOT 0.4 04/10/2019 1240   GFRNONAA >60 11/23/2020 1140   GFRNONAA >60 04/10/2019 1240   GFRAA >60 10/24/2019 1323   GFRAA >60 04/10/2019 1240    No results found  for: TOTALPROTELP, ALBUMINELP, A1GS, A2GS, BETS, BETA2SER, GAMS, MSPIKE, SPEI  Lab Results  Component Value Date   WBC 5.3 11/23/2020   NEUTROABS 3.0 11/23/2020   HGB 11.8 (L) 11/23/2020   HCT 35.8 (L) 11/23/2020   MCV 82.1 11/23/2020   PLT 183 11/23/2020    No results found for: LABCA2  No components found for: [id number]  No results for input(s): INR in the last 168 hours.  No results found for: LABCA2  No results found for: WUJ811  No results found for: BJY782  No results found for: NFA213  No results found for: CA2729  No components found for: HGQUANT  No results found for: CEA1 / No results found for: CEA1   No results found for: AFPTUMOR  No results found for: CHROMOGRNA  No results found for: KPAFRELGTCHN, LAMBDASER, KAPLAMBRATIO (kappa/lambda light chains)  No results found for: HGBA, HGBA2QUANT, HGBFQUANT, HGBSQUAN (Hemoglobinopathy evaluation)   No results found for: LDH  No results found for: IRON, TIBC, IRONPCTSAT (Iron and TIBC)  No results found for: FERRITIN  Urinalysis No results found for: COLORURINE, APPEARANCEUR, LABSPEC, PHURINE, GLUCOSEU, HGBUR, BILIRUBINUR, KETONESUR, PROTEINUR, UROBILINOGEN, NITRITE, LEUKOCYTESUR   STUDIES: DG Chest 2 View  Result Date: 12/23/2020 CLINICAL DATA:  Shortness of breath. EXAM: CHEST - 2 VIEW COMPARISON:  May 22, 2019 FINDINGS: The cardiac silhouette is mildly enlarged. Both lungs are clear. Radiopaque surgical clips are seen within the soft tissues of the left breast. The visualized skeletal structures are unremarkable. IMPRESSION: Mild cardiomegaly without acute cardiopulmonary disease. Electronically Signed   By: Virgina Norfolk M.D.   On: 12/23/2020 20:50   US BREAST LTD UNI LEFT INC AXILLA  Result Date: 12/21/2020 CLINICAL DATA:  55 year old female presenting for second-look ultrasound evaluation of the left breast after recent MRI evaluation. EXAM: ULTRASOUND OF THE LEFT BREAST COMPARISON:   Previous exam(s). FINDINGS: Targeted ultrasound is performed, showing no focal or suspicious sonographic abnormalities along  the retro and periareolar left breast. Note is made of a postoperative seroma along the deep 11 o'clock axis. IMPRESSION: No sonographic correlate to recent MRI findings in the retroareolar left breast. RECOMMENDATION: Recommendation is to proceed with MRI core biopsy as recommended on study dated 11/23/2020. I have discussed the findings and recommendations with the patient. If applicable, a reminder letter will be sent to the patient regarding the next appointment. BI-RADS CATEGORY  2: Benign. Electronically Signed   By: Kristopher Oppenheim M.D.   On: 12/21/2020 15:14  MM CLIP PLACEMENT LEFT  Result Date: 12/24/2020 CLINICAL DATA:  Evaluate post biopsy marker clip placement following MRI guided core needle biopsy a small enhancing left breast mass. EXAM: 3D DIAGNOSTIC LEFT MAMMOGRAM POST MRI BIOPSY COMPARISON:  Previous exam(s). FINDINGS: 3D Mammographic images were obtained following MRI guided biopsy of a small enhancing left breast mass. The biopsy marking clip is in expected position at the site of biopsy. IMPRESSION: Appropriate positioning of the dumbbell shaped biopsy marking clip at the site of biopsy in the anterior, slightly upper retroareolar left breast. Final Assessment: Post Procedure Mammograms for Marker Placement Electronically Signed   By: Lajean Manes M.D.   On: 12/24/2020 10:19  MR LT BREAST BX W LOC DEV 1ST LESION IMAGE BX SPEC MR GUIDE  Addendum Date: 12/25/2020   ADDENDUM REPORT: 12/25/2020 12:27 ADDENDUM: Pathology revealed FAT NECROSIS, FOREIGN MATERIAL AND FOREIGN BODY GIANT CELL REACTION- NO MALIGNANCY IDENTIFIED of the LEFT breast, 3:00 o'clock, anterior, upper inner quadrant, dumbbell clip. This was found to be concordant by Dr. Lajean Manes. Pathology results were discussed with the patient by telephone. The patient reported doing well after the biopsy  with tenderness at the site. Post biopsy instructions and care were reviewed and questions were answered. The patient was encouraged to call [facility] for any additional concerns. Bilateral breast MRI recommended in 6 months per protocol. Pathology results reported by Stacie Acres RN on 12/25/2020. Electronically Signed   By: Lajean Manes M.D.   On: 12/25/2020 12:27   Result Date: 12/25/2020 CLINICAL DATA:  Patient presents for MRI guided core needle biopsy of an area linear enhancement in the anterior left breast. At the time biopsy, with improved fat suppression compared to the diagnostic exam from 11/22/2020, a 5 mm focal mass-like area of enhancement was noted in the anterior, upper inner left breast, with no other areas of abnormal enhancement. This was targeted for biopsy. EXAM: MRI GUIDED CORE NEEDLE BIOPSY OF THE LEFT BREAST TECHNIQUE: Multiplanar, multisequence MR imaging of the left breast was performed both before and after administration of intravenous contrast. CONTRAST:  10 mL of Gadavist COMPARISON:  Previous exams. FINDINGS: I met with the patient, and we discussed the procedure of MRI guided biopsy, including risks, benefits, and alternatives. Specifically, we discussed the risks of infection, bleeding, tissue injury, clip migration, and inadequate sampling. Informed, written consent was given. The usual time out protocol was performed immediately prior to the procedure. Using sterile technique, 1% Lidocaine, MRI guidance, and a 9 gauge vacuum assisted device, biopsy was performed of the small enhancing lesion in the anterior, upper inner left breast using a lateral approach. At the conclusion of the procedure, a dumbbell shaped tissue marker clip was deployed into the biopsy cavity. Follow-up 2-view mammogram was performed and dictated separately. IMPRESSION: MRI guided biopsy of a small masslike area of abnormal enhancement in the anterior, upper inner left breast.  No apparent complications. Electronically Signed: By: Shanon Brow  Ormond M.D. On: 12/24/2020 09:30    ELIGIBLE FOR AVAILABLE RESEARCH PROTOCOL:BR003?--Depending on final surgical pathology  ASSESSMENT: 55 y.o. [place] woman status post left breast upper outer quadrant biopsy 04/03/2019 for a clinical T1b N0, stage IB invasive ductal carcinoma, grade 3, functionally triple negative (estrogen receptor is weakly positive at 10%, progesterone receptor negative, HER-2 not amplified), with an MIB-1 of 30%  (1) status post left lumpectomy and sentinel lymph node sampling 04/17/2019 for a pT1c pN0, stage IB invasive ductal carcinoma, grade 3, triple negative, with negative margins  (a) a total of 2 sentinel lymph nodes were removed  (2) adjuvant chemotherapy consisting of cyclophosphamide and docetaxel every 21 days x 4 started 05/23/2019, completed 07/25/2019 with no dose reductions or delays  (3) adjuvant radiation 08/13/2019 - 09/27/2019  (a) left breast / 50.4 Gy in 28 fractions  (b) seroma boost / 10 Gy in 5 fractions  (4) genetics testing 04/19/2019 through the [facility] Breast Cancer STAT Panel + Common Hereditary Cancers Panel 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 STK11, TP53, TSC1, TSC2, and VHL.  The following genes were evaluated for sequence changes only: SDHA and HOXB13 c.251G>A variant only.  (b) current data suggests an  increase risk of breast and ovarian cancer in patients carrying a BARD1 mutation, but data is preliminary and insufficient for definitive recommendations  (5) prophylactic anastrozole started 10/24/2019  (6) intensified screening:  breast MRI September, mammography March on a yearly basis  (7) s/p bilateral salpingo-oophorectomy 02/12/2020 with benign pathology   PLAN: Jahlia is coming up on a year from her definitive surgery for her breast cancer in February.  She will have a repeat mammography at that time.  I am adding a DEXA scan to be done at the same time which will be our baseline.  She is tolerating anastrozole well and the plan will be to continue that a minimum of 5 years.  She is undergoing intensified screening.  She will be scheduled for repeat breast MRI in August 2023.  I commended her on the exercise and diet plan.  I am sure next year she will not only be able to complete the 5K but do well in it.  Total encounter time 25 minutes.   Loretta Nelson. Emile Kyllo, MD 01/11/21 3:02 PM Medical Oncology and Hematology [facility] [place], [place] [address] Tel. [phone]    Fax. [phone]   I, Wilburn Mylar, am acting as scribe for Dr. Virgie Nelson. Brennin Durfee.  I, Lurline Del MD, have reviewed the above documentation for accuracy and completeness, and I agree with the above.   *Total Encounter Time as defined by the Centers for Medicare and Medicaid Services includes, in addition to the face-to-face time of a patient visit (documented in the note above) non-face-to-face time: obtaining and reviewing outside history, ordering and reviewing medications, tests or procedures, care coordination (communications with other health care professionals or caregivers) and documentation in the medical record.

## 2021-01-11 ENCOUNTER — Inpatient Hospital Stay: Payer: BC Managed Care – PPO | Attending: Oncology | Admitting: Oncology

## 2021-01-11 DIAGNOSIS — Z170 Estrogen receptor positive status [ER+]: Secondary | ICD-10-CM

## 2021-01-11 DIAGNOSIS — C50412 Malignant neoplasm of upper-outer quadrant of left female breast: Secondary | ICD-10-CM

## 2021-01-13 ENCOUNTER — Encounter: Payer: Self-pay | Admitting: Oncology

## 2021-01-19 NOTE — Patient Instructions (Addendum)
Please continue using your CPAP regularly. While your insurance requires that you use CPAP at least 4 hours each night on 70% of the nights, I recommend, that you not skip any nights and use it throughout the night if you can. Getting used to CPAP and staying with the treatment long term does take time and patience and discipline. Untreated obstructive sleep apnea when it is moderate to severe can have an adverse impact on cardiovascular health and raise her risk for heart disease, arrhythmias, hypertension, congestive heart failure, stroke and diabetes. Untreated obstructive sleep apnea causes sleep disruption, nonrestorative sleep, and sleep deprivation. This can have an impact on your day to day functioning and cause daytime sleepiness and impairment of cognitive function, memory loss, mood disturbance, and problems focussing. Using CPAP regularly can improve these symptoms.  Please continue CPAP. Keep an eye on your BP at home.   Follow up with me in 1 year    Management of Memory Problems   There are some general things you can do to help manage your memory problems.  Your memory may not in fact recover, but by using techniques and strategies you will be able to manage your memory difficulties better.   1)  Establish a routine. Try to establish and then stick to a regular routine.  By doing this, you will get used to what to expect and you will reduce the need to rely on your memory.  Also, try to do things at the same time of day, such as taking your medication or checking your calendar first thing in the morning. Think about think that you can do as a part of a regular routine and make a list.  Then enter them into a daily planner to remind you.  This will help you establish a routine.   2)  Organize your environment. Organize your environment so that it is uncluttered.  Decrease visual stimulation.  Place everyday items such as keys or cell phone in the same place every day (ie.  Basket next  to front door) Use post it notes with a brief message to yourself (ie. Turn off light, lock the door) Use labels to indicate where things go (ie. Which cupboards are for food, dishes, etc.) Keep a notepad and pen by the telephone to take messages   3)  Memory Aids A diary or journal/notebook/daily planner Making a list (shopping list, chore list, to do list that needs to be done) Using an alarm as a reminder (kitchen timer or cell phone alarm) Using cell phone to store information (Notes, Calendar, Reminders) Calendar/White board placed in a prominent position Post-it notes   In order for memory aids to be useful, you need to have good habits.  It's no good remembering to make a note in your journal if you don't remember to look in it.  Try setting aside a certain time of day to look in journal.   4)  Improving mood and managing fatigue. There may be other factors that contribute to memory difficulties.  Factors, such as anxiety, depression and tiredness can affect memory. Regular gentle exercise can help improve your mood and give you more energy. Simple relaxation techniques may help relieve symptoms of anxiety Try to get back to completing activities or hobbies you enjoyed doing in the past. Learn to pace yourself through activities to decrease fatigue. Find out about some local support groups where you can share experiences with others. Try and achieve 7-8 hours of sleep at night.

## 2021-01-19 NOTE — Progress Notes (Signed)
PATIENT: Loretta Nelson DOB: 05-14-1965  REASON FOR VISIT: follow up HISTORY FROM: patient  Chief Complaint  Patient presents with   Follow-up    RM 6, alone. Last seen 01/22/20. 1 yr f/u. Tolerating cpap well. Wanting to know what options are when she loses power/unable to use machine. I mentioned generator but she would like to know if she has any other options.      HISTORY OF PRESENT ILLNESS: 01/20/21 ALL: Loretta Nelson returns for follow up for OSA on CPAP.   She completed neuropsych eval with Dr Melvyn Novas 02/2020. Although she did have some deficits in learning and retrieval aspects of memory, all other domains were normal. Chemo treatments most likely contributor of deficits. She is doing fairly well. She is using CPAP nightly without concerns. She did have a power outage recently and was unable to use her machine. She did not sleep well that night. Memory is stable. She does continue to have word finding difficulty at times. 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 She is able to perform job duties.     01/22/2020 ALL:  Loretta Nelson is a 55 y.o. female here today for follow up for OSA on CPAP.  She is doing very well with CPAP therapy since last being seen.  She is using CPAP nightly.  She does report improved sleep quality when using CPAP.  She has had some intermittent insomnia since starting Arimidex.  She has completed chemo and radiation.  She is followed closely by primary care and oncology.  She is requesting a new referral for neurocognitive evaluation as previously discussed with Dr. Leonie Man. She does continue to have difficulty multitasking and concentration. She has word finding difficulty. She is able to work and maintain home. She can drive without difficulty and perform ADL's independently.   Compliance report dated 12/22/2019 through 01/20/2020 reveals that she used CPAP 30 of the past 30 for compliance of 100%.  She is CPAP greater than 4 hours 30 days for  compliance of 100%.  Average usage was 7 hours and 58 minutes.  Residual AHI was 1 on a set pressure of 12 cm of water and EPR 3.  There was no significant leak noted.   HISTORY: (copied from Dr Guadelupe Sabin previous note)  Loretta Nelson is a 55 year old right-handed woman with an underlying medical history of hypertension, vitamin D deficiency, Graves' disease with status post radioactive iodine, reflux disease, arthritis, anxiety, anemia, allergic rhinitis, hypothyroidism, obesity with a BMI of over 4, and recent diagnosis of L sided breast cancer with status post surgery in February and adjuvant chemotherapy since March, with adjuvant radiation therapy pending, who Presents for follow-up consultation of her obstructive sleep apnea after interim testing and starting CPAP therapy.  The patient is unaccompanied today.  I first met her at the request of Dr. Leonie Man on 02/27/2019, at which time she reported snoring and daytime somnolence and had cognitive complaints.  She was advised to proceed with a sleep study.  She had a baseline sleep study, followed by a CPAP titration study.  Her baseline sleep study from 03/15/2019 showed severe obstructive sleep apnea with a total AHI of 57.4/hour, REM AHI of 98.6/hour, supine AHI of 53/hour and O2 nadir of 73%. REM latency was delayed and REM percentage markedly reduced at 4%.  She was advised to return for a CPAP titration study.  She had this on 04/15/2019.  She was fitted with a nasal mask but would prefer nasal  pillows at home as she indicated at the time of her sleep study.  She was titrated from 6 cm to 14 cm.  On the final pressure, her AHI was 0.9/h with supine REM sleep achieved an O2 nadir at 90%.  She was advised to start CPAP therapy at home at a pressure of 14 cm.   She emailed at the end of March due to difficulty tolerating the pressure and I reduced it to 12 cm.    Today, 07/23/2019: I reviewed her CPAP compliance data from 06/22/2019 through 07/21/2019, which is  a total of 30 days, during which time she used her machine every night with percent use days greater than 4 hours at 97%, indicating excellent compliance with an average usage of 6 hours, residual AHI at goal at 1.3/hour, Leak acceptable with a 95th percentile at 9.8 L/min on a pressure of 12 cm with EPR of 3.  She reports that she has had difficulty sleeping since starting chemotherapy. Especially the first 2 weeks after her chemotherapy cycle she is having a tough time. She was diagnosed with left-sided breast cancer in the middle of going through sleep study testing. She has 1 more chemotherapy cycle to go and will start adjuvant radiation therapy. She has a good support system, thankfully, both daughters have graduated college successfully. Her mother has moved him as well. She is motivated to continue with CPAP and tolerates the reduced pressure better. She is currently using a nasal cushion interface but also has a hybrid style fullface mask available. She has struggled with mouth dryness and tries to hydrate better. She had to delay her neuropsychological evaluation at [facility] neurology because of her cancer treatment. She is planning to reschedule her appointment and then schedule a follow-up with Dr. Leonie Man.    REVIEW OF SYSTEMS: Out of a complete 14 system review of symptoms, the patient complains only of the following symptoms, inattention, lack of concentration, intermittent insomnia and all other reviewed systems are negative.  ALLERGIES: Allergies  Allergen Reactions   Tape Itching and Other (See Comments)    Adhesive tape--redness/inflammation/itching    HOME MEDICATIONS: Outpatient Medications Prior to Visit  Medication Sig Dispense Refill   acetaminophen (TYLENOL) 500 MG tablet Take 2 tablets (1,000 mg total) by mouth every 8 (eight) hours as needed. 30 tablet 0   amLODipine (NORVASC) 5 MG tablet Take 5 mg by mouth daily.   5   anastrozole (ARIMIDEX) 1 MG tablet TAKE 1 TABLET BY  MOUTH EVERY DAY 90 tablet 4   Cholecalciferol 4000 UNITS CAPS Take 4,000 Units by mouth daily.      citalopram (CELEXA) 40 MG tablet Take 40 mg by mouth daily.     diclofenac Sodium (VOLTAREN) 1 % GEL Apply 1 application topically 4 (four) times daily as needed (knee pain.).     EDARBYCLOR 40-25 MG TABS Take 1 tablet by mouth daily.  5   ibuprofen (ADVIL) 800 MG tablet Take 1 tablet (800 mg total) by mouth every 8 (eight) hours as needed. 30 tablet 0   L-Methylfolate-B12-B6-B2 (CEREFOLIN) 07-29-48-5 MG TABS Take 1 tablet by mouth every morning. 90 tablet 3   levothyroxine (SYNTHROID) 175 MCG tablet Take 175 mcg by mouth daily before breakfast.   5   loratadine (CLARITIN) 10 MG tablet TAKE 1 TABLET BY MOUTH EVERY DAY 90 tablet 1   metoprolol (TOPROL-XL) 200 MG 24 hr tablet Take 200 mg by mouth daily.   5   naproxen (NAPROSYN) 500 MG  tablet Take 1 tablet by mouth every 12 (twelve) hours as needed.     pantoprazole (PROTONIX) 40 MG tablet Take 40 mg by mouth 2 (two) times daily.      potassium chloride (KLOR-CON) 20 MEQ packet Take 40 mEq by mouth daily.      No facility-administered medications prior to visit.    PAST MEDICAL HISTORY: Past Medical History:  Diagnosis Date   Allergic rhinitis    BARD1 gene mutation positive 7/48/2707   Complication of anesthesia    woke up during EGD   Dysphagia    Empty sella syndrome    Essential hypertension    Family history of breast cancer    Fibroids, intramural 02/17/2017   Gastroesophageal reflux disease    Generalized anxiety disorder    Graves disease 01/13/11   radioactive iodine treatment, 86.7 millicuries   Hiatal hernia    small   History of chemotherapy    History of hysterectomy 02/17/2017   History of migraine headaches    History of radiation therapy    Iron deficiency anemia    Malignant neoplasm of upper-outer quadrant of left breast in female, 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   Meralgia paresthetica    Murmur    benign,  h/o, caused by hyperdynamic contraction   Obesity    Obstructive sleep apnea    Pneumonia 2017   inhaler prescribed   Postoperative hypothyroidism    Primary osteoarthritis of right foot 03/12/2018   Tendonitis of shoulder, right    Vitamin D deficiency     PAST SURGICAL HISTORY: Past Surgical History:  Procedure Laterality Date   ABDOMINAL HYSTERECTOMY  2018   BALLOON DILATION N/A 09/07/2016   Procedure: BALLOON DILATION;  Surgeon: Arta Silence, MD;  Location: [place];  Service: Endoscopy;  Laterality: N/A;   BREAST LUMPECTOMY Left 03/2019   BREAST LUMPECTOMY WITH RADIOACTIVE SEED AND SENTINEL LYMPH NODE BIOPSY Left 04/17/2019   Procedure: LEFT BREAST LUMPECTOMY WITH RADIOACTIVE SEED AND SENTINEL LYMPH NODE BIOPSY;  Surgeon: Donnie Mesa, MD;  Location: [place];  Service: General;  Laterality: Left;  LMA, PEC BLOCK   CESAREAN SECTION  1999   COLONOSCOPY     DILATION AND CURETTAGE OF UTERUS     ESOPHAGOGASTRODUODENOSCOPY     ESOPHAGOGASTRODUODENOSCOPY (EGD) WITH PROPOFOL N/A 09/07/2016   Procedure: ESOPHAGOGASTRODUODENOSCOPY (EGD) WITH PROPOFOL;  Surgeon: Arta Silence, MD;  Location: [place];  Service: Endoscopy;  Laterality: N/A;   HYSTERECTOMY ABDOMINAL WITH SALPINGECTOMY Bilateral 02/17/2017   Procedure: HYSTERECTOMY ABDOMINAL WITH SALPINGECTOMY;  Surgeon: Christophe Louis, MD;  Location: [facility];  Service: Gynecology;  Laterality: Bilateral;   KNEE ARTHROSCOPY     for torn meniscus   LAPAROSCOPIC SALPINGO OOPHERECTOMY N/A 02/12/2020   Procedure: DIAGNOSTIC LAPAROSCOPY ;  Surgeon: Christophe Louis, MD;  Location: [place];  Service: Gynecology;  Laterality: N/A;   LAPAROTOMY Bilateral 02/12/2020   Procedure: EXPLORATORY LAPAROTOMY WITH BILATERAL SALPINGO OOPHORECTOMY;  Surgeon: Christophe Louis, MD;  Location: [place];  Service: Gynecology;  Laterality: Bilateral;   LYSIS OF ADHESION N/A 02/12/2020   Procedure: LYSIS OF ADHESION;  Surgeon: Christophe Louis, MD;  Location: [place];  Service:  Gynecology;  Laterality: N/A;   PORT-A-CATH REMOVAL Right 01/07/2020   Procedure: REMOVAL PORT-A-CATH;  Surgeon: Donnie Mesa, MD;  Location: [place];  Service: General;  Laterality: Right;   PORTACATH PLACEMENT Right 05/22/2019   Procedure: INSERTION PORT-A-CATH WITH ULTRASOUND GUIDANCE;  Surgeon: Donnie Mesa, MD;  Location: [facility];  Service: General;  Laterality: Right;  Endotrachial tube   WISDOM TOOTH EXTRACTION     WISDOM TOOTH EXTRACTION      FAMILY HISTORY: Family History  Problem Relation Age of Onset   Hypertension Mother    Alcoholism Father    CAD Maternal Grandfather    Breast cancer Paternal Grandmother    HIV Brother    Heart attack Brother        sudden MI vs PE   Breast cancer Sister 33   Breast cancer Other        one dx 54s, others dx in 53s   Alzheimer's disease Other    Colon polyps Neg Hx    Colon cancer Neg Hx    Liver disease Neg Hx     SOCIAL HISTORY: Social History   Socioeconomic History   Marital status: Divorced    Spouse name: Not on file   Number of children: Not on file   Years of education: 18   Highest education level: Master's degree (e.g., MA, MS, MEng, MEd, MSW, MBA)  Occupational History   Not on file  Tobacco Use   Smoking status: Never   Smokeless tobacco: Never  Vaping Use   Vaping Use: Never used  Substance and Sexual Activity   Alcohol use: No   Drug use: No   Sexual activity: Not Currently    Birth control/protection: Abstinence  Other Topics Concern   Not on file  Social History Narrative   Not on file   Social Determinants of Health   Financial Resource Strain: Not on file  Food Insecurity: Not on file  Transportation Needs: Not on file  Physical Activity: Not on file  Stress: Not on file  Social Connections: Not on file  Intimate Partner Violence: Not on file     PHYSICAL EXAM  Vitals:   01/20/21 0834 01/20/21 0910  BP: (!) 177/80 (!) 152/80  Pulse: 60   SpO2: 97%   Weight: (!) 314 lb 3.2 oz (142.5  kg)   Height: _0  (1.626 m)     Body mass index is 53.93 kg/m.  Generalized: Well developed, in no acute distress  Cardiology: normal rate and rhythm, no murmur noted Respiratory: clear to auscultation bilaterally  Neurological examination  Mentation: Alert oriented to time, place, history taking. Follows all commands speech and language fluent Cranial nerve II-XII: Pupils were equal round reactive to light. Extraocular movements were full, visual field were full  Motor: The motor testing reveals 5 over 5 strength of all 4 extremities. Good symmetric motor tone is noted throughout.  Gait and station: Gait is normal.    DIAGNOSTIC DATA (LABS, IMAGING, TESTING) - I reviewed patient records, labs, notes, testing and imaging myself where available.  MMSE - Mini Mental State Exam 01/15/2019  Orientation to time 5  Orientation to Place 5  Registration 3  Attention/ Calculation 5  Recall 3  Language- name 2 objects 2  Language- repeat 1  Language- follow 3 step command 3  Language- read & follow direction 1  Write a sentence 1  Copy design 1  Total score 30     Lab Results  Component Value Date   WBC 5.3 11/23/2020   HGB 11.8 (L) 11/23/2020   HCT 35.8 (L) 11/23/2020   MCV 82.1 11/23/2020   PLT 183 11/23/2020      Component Value Date/Time   NA 143 11/23/2020 1140   K 3.8 11/23/2020 1140   CL 106 11/23/2020 1140   CO2 28 11/23/2020 1140  GLUCOSE 106 (H) 11/23/2020 1140   BUN 21 (H) 11/23/2020 1140   CREATININE 0.80 11/23/2020 1140   CREATININE 0.83 04/10/2019 1240   CALCIUM 9.8 11/23/2020 1140   PROT 7.5 11/23/2020 1140   ALBUMIN 3.9 11/23/2020 1140   AST 19 11/23/2020 1140   AST 17 04/10/2019 1240   ALT 17 11/23/2020 1140   ALT 19 04/10/2019 1240   ALKPHOS 83 11/23/2020 1140   BILITOT 0.3 11/23/2020 1140   BILITOT 0.4 04/10/2019 1240   GFRNONAA >60 11/23/2020 1140   GFRNONAA >60 04/10/2019 1240   GFRAA >60 10/24/2019 1323   GFRAA >60 04/10/2019 1240    No results found for: CHOL, HDL, LDLCALC, LDLDIRECT, TRIG, CHOLHDL No results found for: HGBA1C Lab Results  Component Value Date   VITAMINB12 256 01/15/2019   No results found for: TSH   ASSESSMENT AND PLAN 55 y.o. year old female  has a past medical history of Allergic rhinitis, BARD1 gene mutation positive (8/54/6270), Complication of anesthesia, Dysphagia, Empty sella syndrome, Essential hypertension, Family history of breast cancer, Fibroids, intramural (02/17/2017), Gastroesophageal reflux disease, Generalized anxiety disorder, Graves disease (01/13/11), Hiatal hernia, History of chemotherapy, History of hysterectomy (02/17/2017), History of migraine headaches, History of radiation therapy, Iron deficiency anemia, Malignant neoplasm of upper-outer quadrant of left breast in female, estrogen receptor positive (04/05/2019), Meralgia paresthetica, Murmur, Obesity, Obstructive sleep apnea, Pneumonia (2017), Postoperative hypothyroidism, Primary osteoarthritis of right foot (03/12/2018), Tendonitis of shoulder, right, and Vitamin D deficiency. here with     ICD-10-CM   1. OSA on CPAP  G47.33 For home use only DME continuous positive airway pressure (CPAP)   Z99.89     2. Cognitive deficits  R41.89       Loretta Nelson is doing well on CPAP therapy. Compliance report reveals excellent compliance. She was encouraged to continue using CPAP nightly and for greater than 4 hours each night. We will update supply orders as indicated. Risks of untreated sleep apnea review and education materials provided. She may reach out to DME regarding possible battery pack back up for CPAP during power outage. She will continue memory compensation strategies. She will keep an eye on BP at home. Usually 130's/80's at home. Healthy lifestyle habits encouraged. She will follow up in 1 year, sooner if needed. She verbalizes understanding and agreement with this plan.   Orders Placed This Encounter  Procedures    For home use only DME continuous positive airway pressure (CPAP)    Supplies    Order Specific Question:   Length of Need    Answer:   Lifetime    Order Specific Question:   Patient has OSA or probable OSA    Answer:   Yes    Order Specific Question:   Is the patient currently using CPAP in the home    Answer:   Yes    Order Specific Question:   Settings    Answer:   Other see comments    Order Specific Question:   CPAP supplies needed    Answer:   Mask, headgear, cushions, filters, heated tubing and water chamber      No orders of the defined types were placed in this encounter.     Debbora Presto, FNP-C 01/20/2021, 9:11 AM [facility] [address] [place], [place] [address] [phone]

## 2021-01-20 ENCOUNTER — Encounter: Payer: Self-pay | Admitting: Family Medicine

## 2021-01-20 ENCOUNTER — Other Ambulatory Visit: Payer: Self-pay

## 2021-01-20 ENCOUNTER — Ambulatory Visit: Payer: BC Managed Care – PPO | Admitting: Family Medicine

## 2021-01-20 VITALS — BP 152/80 | HR 60 | Ht 64.0 in | Wt 314.2 lb

## 2021-01-20 DIAGNOSIS — G4733 Obstructive sleep apnea (adult) (pediatric): Secondary | ICD-10-CM | POA: Diagnosis not present

## 2021-01-20 DIAGNOSIS — R4189 Other symptoms and signs involving cognitive functions and awareness: Secondary | ICD-10-CM | POA: Diagnosis not present

## 2021-01-20 DIAGNOSIS — Z9989 Dependence on other enabling machines and devices: Secondary | ICD-10-CM | POA: Diagnosis not present

## 2021-02-19 ENCOUNTER — Other Ambulatory Visit: Payer: Self-pay | Admitting: Neurology

## 2021-02-23 NOTE — Telephone Encounter (Signed)
Rx refilled.

## 2021-03-12 ENCOUNTER — Encounter: Payer: Self-pay | Admitting: Family Medicine

## 2021-03-15 ENCOUNTER — Other Ambulatory Visit: Payer: Self-pay | Admitting: *Deleted

## 2021-03-15 IMAGING — MG MM PLC BREAST LOC DEV 1ST LESION INC MAMMO GUIDE*L*
6 series · 6 of 6 positions shown · non-contrast
Comparison: Previous exam(s).

CLINICAL DATA: Localization prior to surgery

EXAM:
MAMMOGRAPHIC GUIDED RADIOACTIVE SEED LOCALIZATION OF THE LEFT BREAST

[L CC (1 of 3)]
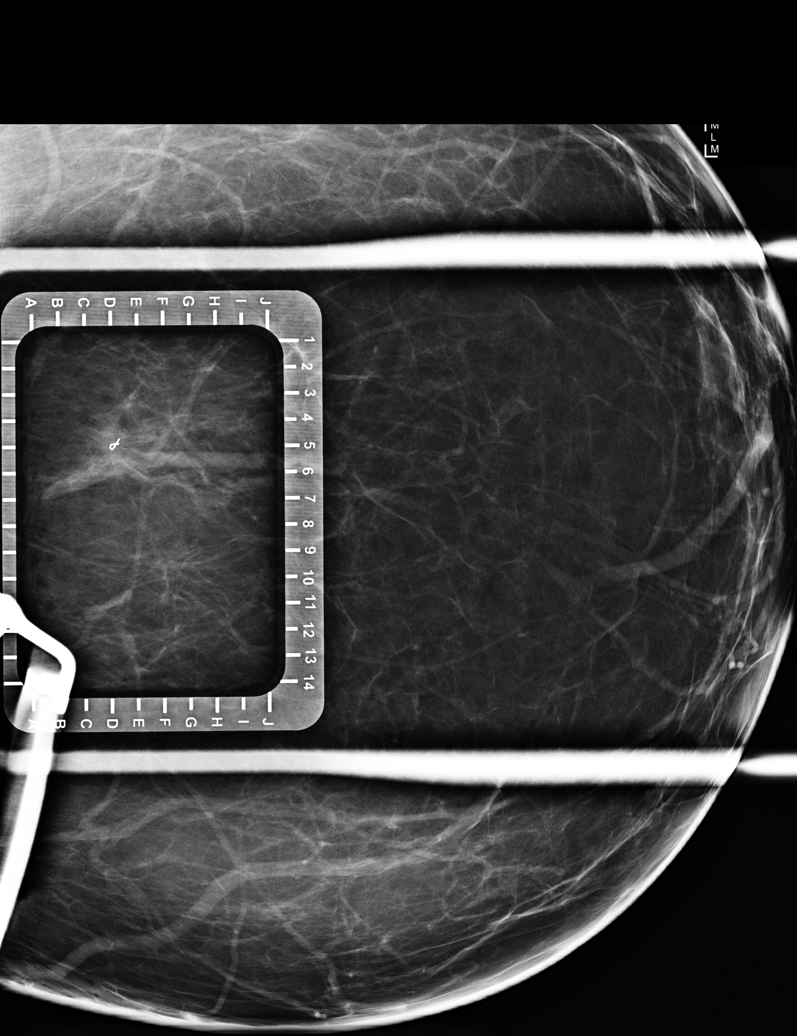

[L ML (1 of 3)]
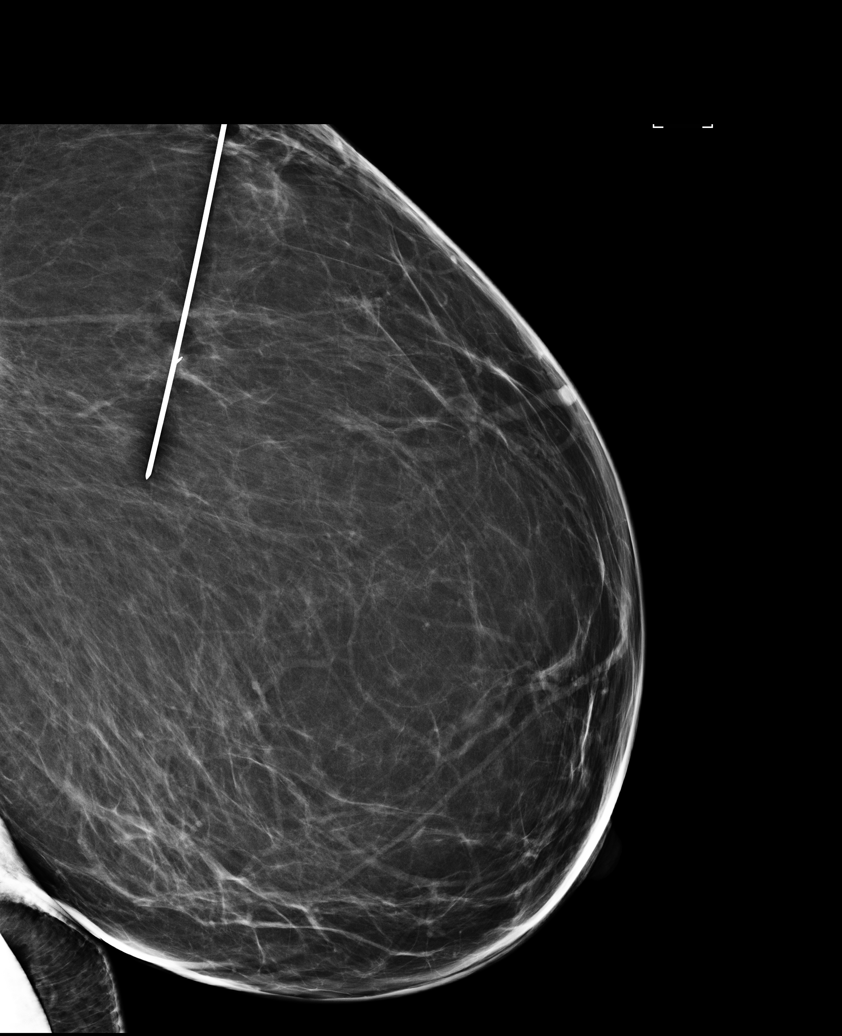

[L CC (2 of 3)]
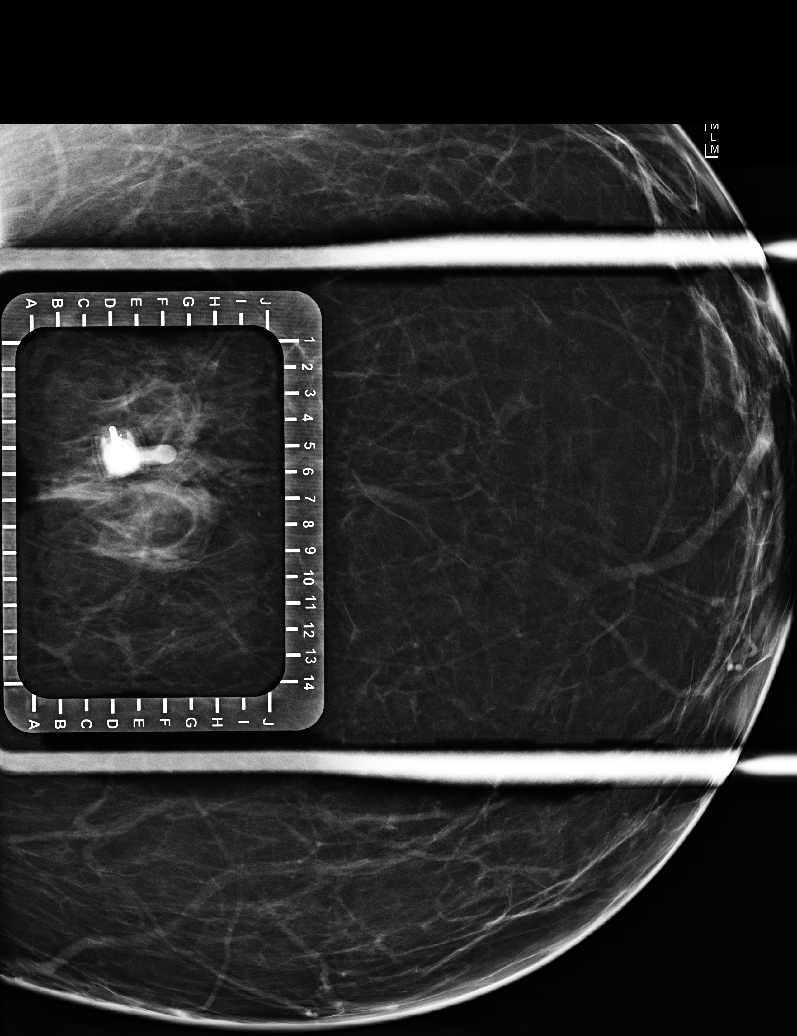

[L ML (2 of 3)]
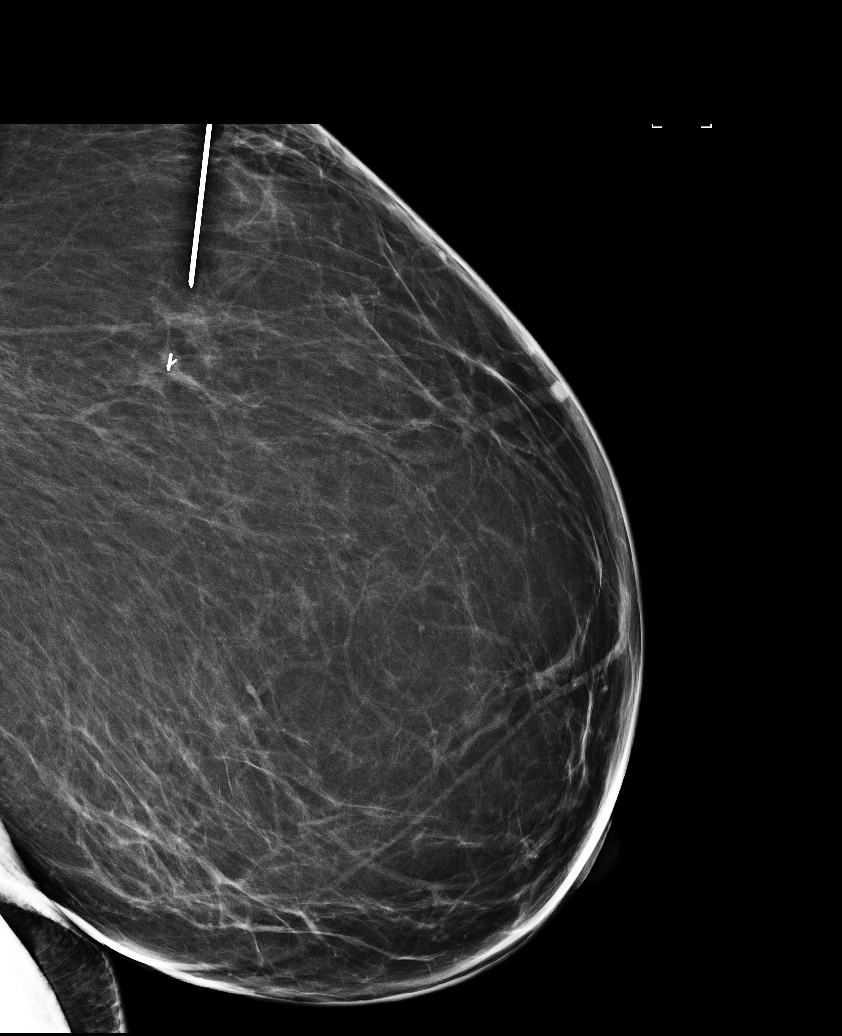

[L ML (3 of 3)]
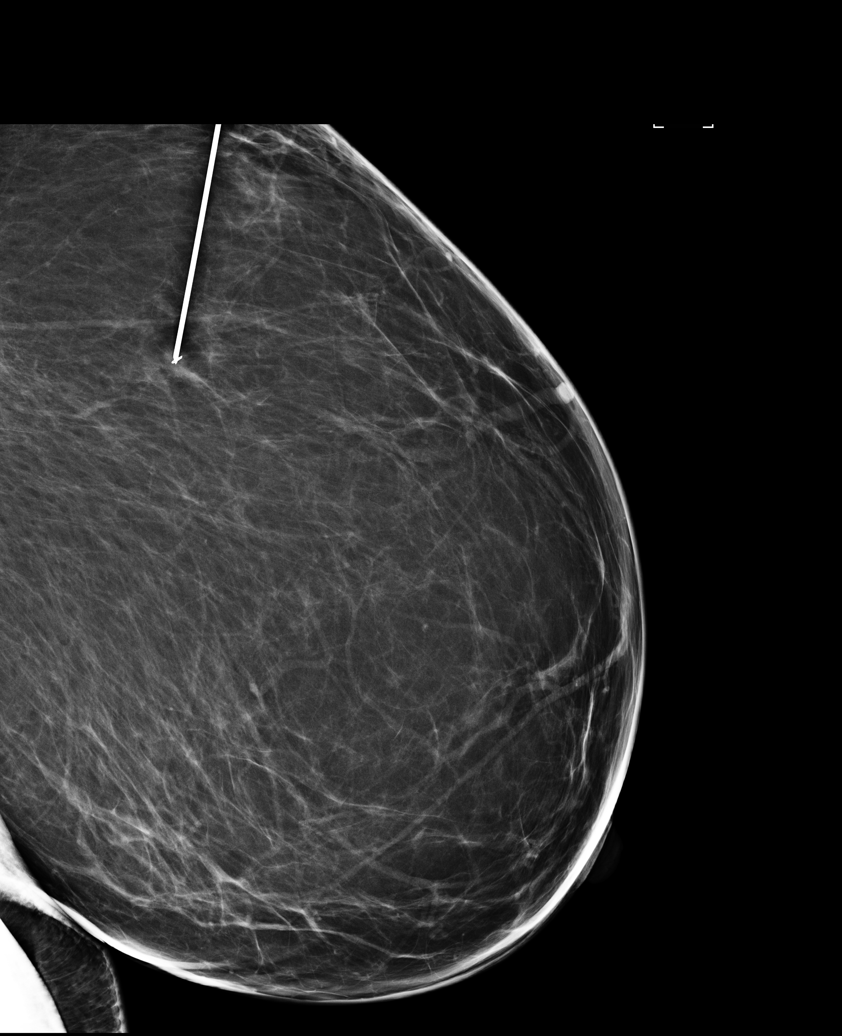

[L CC (3 of 3)]
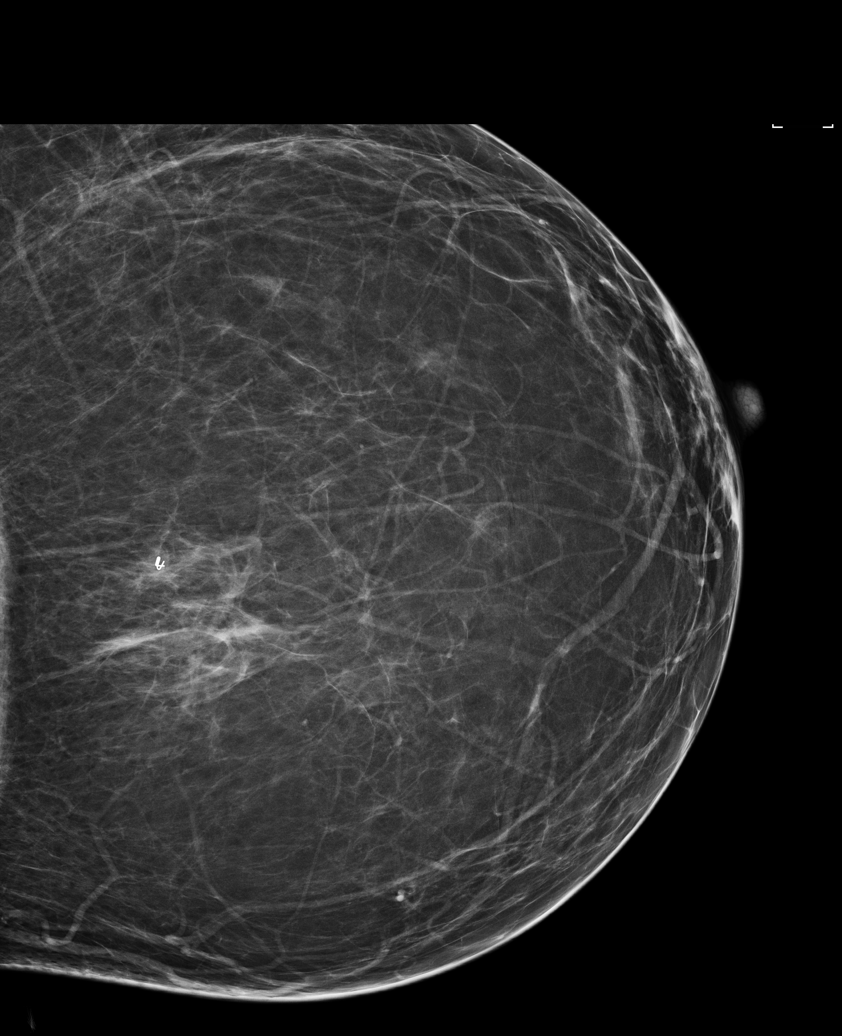

[6 of 6 positions shown; findings below may reference images not displayed]



The usual time-out protocol was performed immediately prior to the
procedure.

Using mammographic guidance, sterile technique, 1% lidocaine and an
D-BH2 radioactive seed, biopsy clip was localized using a superior
approach. The follow-up mammogram images confirm the seed in the
expected location and were marked for surgeon.

Follow-up survey of the patient confirms presence of the radioactive
seed.

Order number of D-BH2 seed:  [id number].

Total activity:  0.259 millicuries reference Date: January 29, 2019

The patient tolerated the procedure well and was released from the
[REDACTED]. She was given instructions regarding seed removal.
IMPRESSION: Radioactive seed localization left breast. No apparent
complications.

## 2021-03-15 MED ORDER — CEREFOLIN 6-1-50-5 MG PO TABS: 1.0000 | ORAL_TABLET | Freq: Every morning | ORAL | 3 refills | Status: DC

## 2021-03-16 IMAGING — MG MM BREAST SURGICAL SPECIMEN
1 series · 1 of 1 positions shown · non-contrast
Comparison: Previous exam(s).

CLINICAL DATA: Status post radioactive seed localized lumpectomy
LEFT breast.

EXAM:
SPECIMEN RADIOGRAPH OF THE LEFT BREAST

[L]
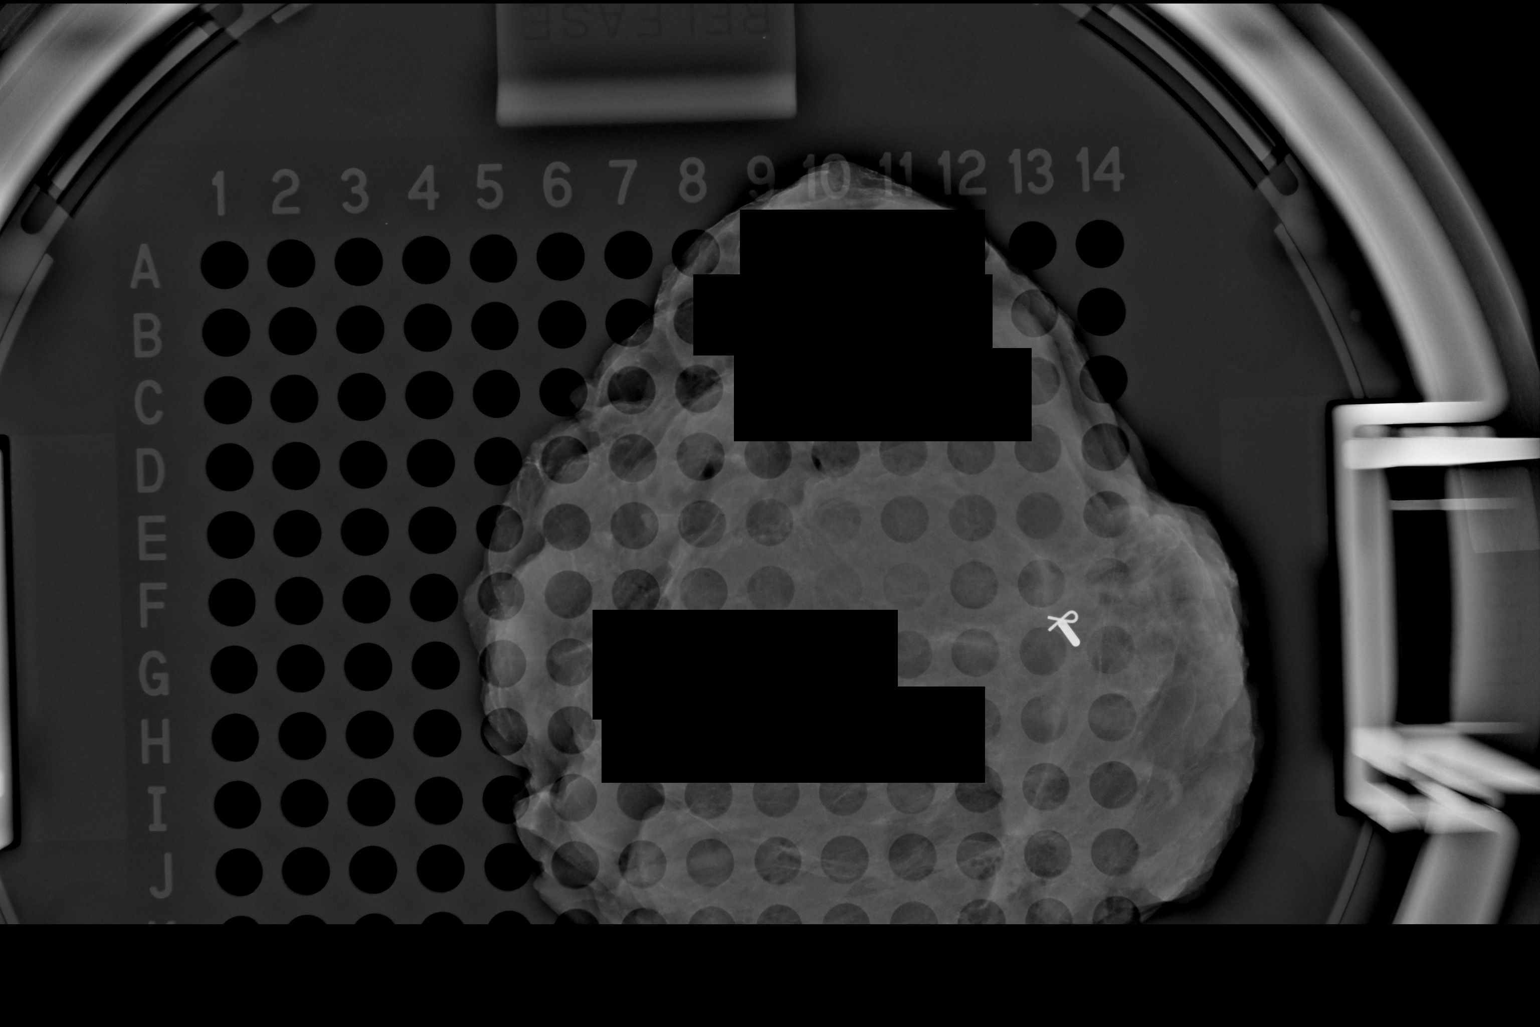

[1 of 1 positions shown; findings below may reference images not displayed]

FINDINGS: Status post excision of the left breast. The radioactive seed and
ribbon shaped biopsy marker clip are present, completely intact, and
were marked for pathology.
IMPRESSION: Specimen radiograph of the left breast.

## 2021-04-20 IMAGING — DX DG CHEST 1V PORT
1 series · 1 of 1 positions shown · non-contrast
Comparison: Portable exam 4141 hours compared to 03/07/2016

CLINICAL DATA: Central line placement

EXAM:
PORTABLE CHEST 1 VIEW

[chest ap]
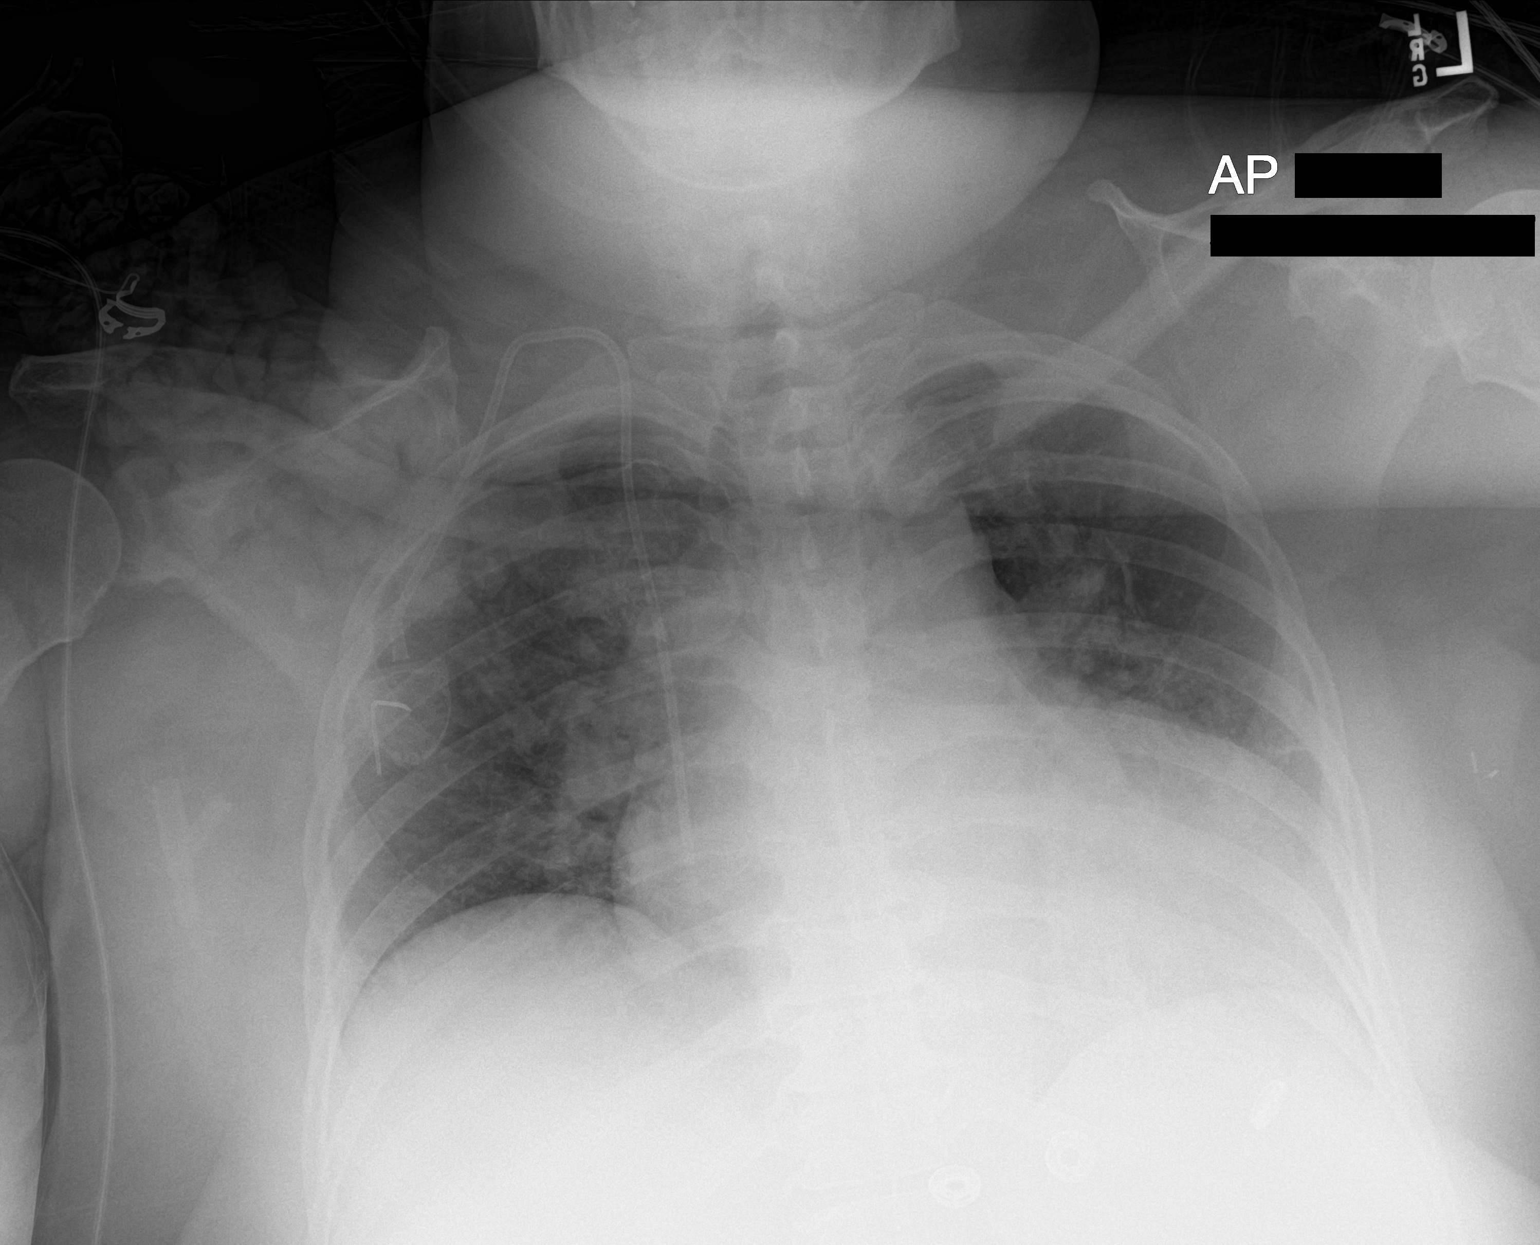

[1 of 1 positions shown; findings below may reference images not displayed]

FINDINGS: RIGHT jugular Port-A-Cath with tip projecting over high RIGHT
atrium.

Enlargement of cardiac silhouette with vascular congestion.

Mediastinal contours grossly unremarkable for rotation to the RIGHT.

Ice pack projects over RIGHT shoulder.

Decreased lung volumes with mild bibasilar atelectasis.

No infiltrate, pleural effusion or pneumothorax.
IMPRESSION: Bibasilar atelectasis.

No pneumothorax following Port-A-Cath placement.

## 2021-04-30 ENCOUNTER — Telehealth: Payer: Self-pay | Admitting: Hematology and Oncology

## 2021-04-30 NOTE — Telephone Encounter (Signed)
.  Called patient to schedule appointment per 3/3 inbasket, patient is aware of date and time.   ?

## 2021-05-03 ENCOUNTER — Ambulatory Visit
Admission: RE | Admit: 2021-05-03 | Discharge: 2021-05-03 | Disposition: A | Discharge status: Home / Self Care | Payer: BC Managed Care – PPO | Source: Ambulatory Visit | Attending: Hematology and Oncology | Admitting: Hematology and Oncology

## 2021-05-03 ENCOUNTER — Other Ambulatory Visit: Payer: Self-pay

## 2021-05-03 ENCOUNTER — Other Ambulatory Visit: Payer: Self-pay | Admitting: Hematology and Oncology

## 2021-05-03 ENCOUNTER — Other Ambulatory Visit: Payer: Self-pay | Admitting: *Deleted

## 2021-05-03 DIAGNOSIS — C50412 Malignant neoplasm of upper-outer quadrant of left female breast: Secondary | ICD-10-CM

## 2021-05-03 DIAGNOSIS — Z170 Estrogen receptor positive status [ER+]: Secondary | ICD-10-CM

## 2021-05-14 ENCOUNTER — Other Ambulatory Visit: Payer: Self-pay | Admitting: *Deleted

## 2021-05-14 DIAGNOSIS — C50412 Malignant neoplasm of upper-outer quadrant of left female breast: Secondary | ICD-10-CM

## 2021-05-17 ENCOUNTER — Other Ambulatory Visit: Payer: Self-pay

## 2021-05-17 ENCOUNTER — Inpatient Hospital Stay: Payer: BC Managed Care – PPO | Attending: Hematology and Oncology

## 2021-05-17 DIAGNOSIS — Z170 Estrogen receptor positive status [ER+]: Secondary | ICD-10-CM

## 2021-05-17 DIAGNOSIS — Z923 Personal history of irradiation: Secondary | ICD-10-CM | POA: Insufficient documentation

## 2021-05-17 DIAGNOSIS — Z853 Personal history of malignant neoplasm of breast: Secondary | ICD-10-CM | POA: Insufficient documentation

## 2021-05-17 DIAGNOSIS — Z9221 Personal history of antineoplastic chemotherapy: Secondary | ICD-10-CM | POA: Insufficient documentation

## 2021-05-17 DIAGNOSIS — Z79899 Other long term (current) drug therapy: Secondary | ICD-10-CM | POA: Insufficient documentation

## 2021-05-17 LAB — CMP (CANCER CENTER ONLY)
ALT: 16 U/L (ref 0–44)
AST: 18 U/L (ref 15–41)
Albumin: 4 g/dL (ref 3.5–5.0)
Alkaline Phosphatase: 75 U/L (ref 38–126)
Anion gap: 6 (ref 5–15)
BUN: 14 mg/dL (ref 6–20)
CO2: 33 mmol/L — ABNORMAL HIGH (ref 22–32)
Calcium: 9.6 mg/dL (ref 8.9–10.3)
Chloride: 103 mmol/L (ref 98–111)
Creatinine: 0.84 mg/dL (ref 0.44–1.00)
GFR, Estimated: >60 mL/min (ref >60)
Glucose, Bld: 97 mg/dL (ref 70–99)
Potassium: 3.7 mmol/L (ref 3.5–5.1)
Sodium: 142 mmol/L (ref 135–145)
Total Bilirubin: 0.3 mg/dL (ref 0.3–1.2)
Total Protein: 7.2 g/dL (ref 6.5–8.1)

## 2021-05-17 LAB — CBC WITH DIFFERENTIAL (CANCER CENTER ONLY)
Abs Immature Granulocytes: 0.03 10*3/uL (ref 0.00–0.07)
Basophils Absolute: 0 10*3/uL (ref 0.0–0.1)
Basophils Relative: 1 %
Eosinophils Absolute: 0.3 10*3/uL (ref 0.0–0.5)
Eosinophils Relative: 4 %
HCT: 36.1 % (ref 36.0–46.0)
Hemoglobin: 11.5 g/dL — ABNORMAL LOW (ref 12.0–15.0)
Immature Granulocytes: 1 %
Lymphocytes Relative: 29 %
Lymphs Abs: 1.8 10*3/uL (ref 0.7–4.0)
MCH: 26.9 pg (ref 26.0–34.0)
MCHC: 31.9 g/dL (ref 30.0–36.0)
MCV: 84.3 fL (ref 80.0–100.0)
Monocytes Absolute: 0.5 10*3/uL (ref 0.1–1.0)
Monocytes Relative: 8 %
Neutro Abs: 3.6 10*3/uL (ref 1.7–7.7)
Neutrophils Relative %: 57 %
Platelet Count: 187 10*3/uL (ref 150–400)
RBC: 4.28 MIL/uL (ref 3.87–5.11)
RDW: 13.2 % (ref 11.5–15.5)
WBC Count: 6.2 10*3/uL (ref 4.0–10.5)
nRBC: 0 % (ref 0.0–0.2)

## 2021-05-19 NOTE — Progress Notes (Signed)
? ?Patient Care Team: ?[name], MD as PCP - General (Family Medicine) ?Mauro Kaufmann, RN as Oncology Nurse Navigator ?Rockwell Germany, RN as Oncology Nurse Navigator ?Donnie Mesa, MD as Consulting Physician (General Surgery) ?Magrinat, Virgie Dad, MD (Inactive) as Consulting Physician (Oncology) ?Kyung Rudd, MD as Consulting Physician (Radiation Oncology) ?Delrae Rend, MD as Consulting Physician (Endocrinology) ?Star Age, MD as Consulting Physician (Neurology) ?Garvin Fila, MD as Consulting Physician (Neurology) ?Christophe Louis, MD as Consulting Physician (Obstetrics and Gynecology) ?Arta Silence, MD as Consulting Physician (Gastroenterology) ? ?DIAGNOSIS:  ?Encounter Diagnosis  ?Name Primary?  ? Malignant neoplasm of upper-outer quadrant of left breast in female, estrogen receptor positive   ? ? ?SUMMARY OF ONCOLOGIC HISTORY: ?Oncology History  ?Malignant neoplasm of upper-outer quadrant of left breast in female, estrogen receptor positive  ?04/05/2019 Initial Diagnosis  ? Malignant neoplasm of upper-outer quadrant of left breast in female, estrogen receptor positive ([facility]) ?  ?04/17/2019 Cancer Staging  ? Staging form: Breast, AJCC 8th Edition ?- Pathologic stage from 04/17/2019: Stage IB (pT1c, pN0, cM0, G3, ER-, PR-, HER2-) - Signed by Gardenia Phlegm, NP on 05/01/2019 ? ?  ?04/19/2019 Genetic Testing  ? 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 The report date is 04/19/2019. Remainder of testing was normal.  ? ?The STAT Breast cancer panel offered by [facility] includes sequencing and rearrangement analysis for the following 9 genes:  ATM, BRCA1, BRCA2, CDH1, CHEK2, PALB2, PTEN, STK11 and TP53.   ? ?The Common Hereditary Cancers Panel offered by [facility] includes sequencing and/or deletion duplication testing of the following 48 genes: APC, ATM, AXIN2, BARD1, BMPR1A, BRCA1, BRCA2, BRIP1, CDH1,  CDKN2A (p14ARF), CDKN2A (p16INK4a), CKD4, CHEK2, CTNNA1, DICER1, EPCAM (Deletion/duplication testing only), GREM1 (promoter region deletion/duplication testing only), KIT, MEN1, MLH1, MSH2, MSH3, MSH6, MUTYH, NBN, NF1, NHTL1, PALB2, PDGFRA, PMS2, POLD1, POLE, PTEN, RAD50, RAD51C, RAD51D, RNF43, SDHB, SDHC, SDHD, SMAD4, SMARCA4. STK11, TP53, TSC1, TSC2, and VHL.  The following genes were evaluated for sequence changes only: SDHA and HOXB13 c.251G>A variant only. ?  ?05/23/2019 -  Chemotherapy  ? The patient had dexamethasone (DECADRON) 4 MG tablet, 8 mg, Oral, 2 times daily, 1 of 1 cycle, Start date: 05/03/2019, End date: -- ?palonosetron (ALOXI) injection 0.25 mg, 0.25 mg, Intravenous,  Once, 4 of 4 cycles ?Administration: 0.25 mg (05/23/2019), 0.25 mg (06/13/2019), 0.25 mg (07/04/2019), 0.25 mg (07/25/2019) ?pegfilgrastim (NEULASTA ONPRO KIT) injection 6 mg, 6 mg, Subcutaneous, Once, 1 of 1 cycle ?Administration: 6 mg (06/13/2019) ?pegfilgrastim-jmdb (FULPHILA) injection 6 mg, 6 mg, Subcutaneous,  Once, 3 of 3 cycles ?Administration: 6 mg (05/25/2019), 6 mg (07/06/2019), 6 mg (07/27/2019) ?cyclophosphamide (CYTOXAN) 1,500 mg in sodium chloride 0.9 % 250 mL chemo infusion, 600 mg/m2 = 1,500 mg, Intravenous,  Once, 4 of 4 cycles ?Administration: 1,500 mg (05/23/2019), 1,500 mg (06/13/2019), 1,500 mg (07/04/2019), 1,500 mg (07/25/2019) ?DOCEtaxel (TAXOTERE) 190 mg in sodium chloride 0.9 % 500 mL chemo infusion, 75 mg/m2 = 190 mg, Intravenous,  Once, 4 of 4 cycles ?Administration: 190 mg (05/23/2019), 190 mg (06/13/2019), 190 mg (07/04/2019), 190 mg (07/25/2019) ?fosaprepitant (EMEND) 150 mg in sodium chloride 0.9 % 145 mL IVPB, 150 mg, Intravenous,  Once, 3 of 3 cycles ?Administration: 150 mg (06/13/2019), 150 mg (07/04/2019), 150 mg (07/25/2019) ? ? for chemotherapy treatment.  ? ?  ? ? ?CHIEF COMPLIANT: triple negative breast cancer ? ?INTERVAL HISTORY: Loretta [facility] is a 56 yo with above mention  triple negative breast cancer. She presents  to the clinic today for a follow-up. She states that feels some hot flashes, joint pain. She complains of anxiety. She states that she is doing a lot of self care. ? ? ? ? ? ?ALLERGIES:  is allergic to tape. ? ?MEDICATIONS:  ?Current Outpatient Medications  ?Medication Sig Dispense Refill  ? desvenlafaxine (PRISTIQ) 100 MG 24 hr tablet Take 1 tablet (100 mg total) by mouth daily.  3  ? acetaminophen (TYLENOL) 500 MG tablet Take 2 tablets (1,000 mg total) by mouth every 8 (eight) hours as needed. 30 tablet 0  ? amLODipine (NORVASC) 5 MG tablet Take 5 mg by mouth daily.   5  ? Cholecalciferol 4000 UNITS CAPS Take 4,000 Units by mouth daily.     ? diclofenac Sodium (VOLTAREN) 1 % GEL Apply 1 application topically 4 (four) times daily as needed (knee pain.).    ? EDARBYCLOR 40-25 MG TABS Take 1 tablet by mouth daily.  5  ? ibuprofen (ADVIL) 800 MG tablet Take 1 tablet (800 mg total) by mouth every 8 (eight) hours as needed. 30 tablet 0  ? L-Methylfolate-B12-B6-B2 (CEREFOLIN) 07-29-48-5 MG TABS Take 1 tablet by mouth every morning. 90 tablet 3  ? levothyroxine (SYNTHROID) 175 MCG tablet Take 175 mcg by mouth daily before breakfast.   5  ? loratadine (CLARITIN) 10 MG tablet TAKE 1 TABLET BY MOUTH EVERY DAY 90 tablet 1  ? metoprolol (TOPROL-XL) 200 MG 24 hr tablet Take 200 mg by mouth daily.   5  ? naproxen (NAPROSYN) 500 MG tablet Take 1 tablet by mouth every 12 (twelve) hours as needed.    ? pantoprazole (PROTONIX) 40 MG tablet Take 40 mg by mouth 2 (two) times daily.     ? potassium chloride (KLOR-CON) 20 MEQ packet Take 40 mEq by mouth daily.     ? ?No current facility-administered medications for this visit.  ? ? ?PHYSICAL EXAMINATION: ?ECOG PERFORMANCE STATUS: 1 ?Vitals:  ? 05/20/21 1521  ?BP: (!) 139/57  ?Pulse: 68  ?Resp: 18  ?Temp: (!) 97.5 ?F (36.4 ?C)  ?SpO2: 98%  ? ?Filed Weights  ? 05/20/21 1521  ?Weight: (!) 311 lb 11.2 oz (141.4 kg)  ? ? ?BREAST: No palpable masses or nodules in either right or left breasts.  No palpable axillary supraclavicular or infraclavicular adenopathy no breast tenderness or nipple discharge. (exam performed in the presence of a chaperone) ? ?LABORATORY DATA:  ?I have reviewed the data as listed ? ?  Latest Ref Rng & Units 05/17/2021  ?  3:51 PM 11/23/2020  ? 11:40 AM 06/11/2020  ? 11:29 AM  ?CMP  ?Glucose 70 - 99 mg/dL 97   106   126    ?BUN 6 - 20 mg/dL '14   21   12    ' ?Creatinine 0.44 - 1.00 mg/dL 0.84   0.80   0.78    ?Sodium 135 - 145 mmol/L 142   143   144    ?Potassium 3.5 - 5.1 mmol/L 3.7   3.8   3.4    ?Chloride 98 - 111 mmol/L 103   106   102    ?CO2 22 - 32 mmol/L 33   28   31    ?Calcium 8.9 - 10.3 mg/dL 9.6   9.8   9.4    ?Total Protein 6.5 - 8.1 g/dL 7.2   7.5   7.4    ?Total Bilirubin 0.3 - 1.2 mg/dL 0.3  0.3   0.4    ?Alkaline Phos 38 - 126 U/L 75   83   96    ?AST 15 - 41 U/L '18   19   17    ' ?ALT 0 - 44 U/L '16   17   16    ' ? ? ?Lab Results  ?Component Value Date  ? WBC 6.2 05/17/2021  ? HGB 11.5 (L) 05/17/2021  ? HCT 36.1 05/17/2021  ? MCV 84.3 05/17/2021  ? PLT 187 05/17/2021  ? NEUTROABS 3.6 05/17/2021  ? ? 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 ?Current treatment: Anastrozole started 10/24/2019 discontinued 05/20/2021 due to multiple side effects including hot flashes, joint stiffness, mood swings, worsening anxiety, weight gain etc. ?Because the final pathology was triple negative I did not think she would benefit from antiestrogen therapy. ? ?Breast cancer surveillance: ?1.  Breast exam 05/20/2021: Benign ?2. mammogram 05/03/2021: Benign breast density category ?3.  Breast MRIs need  to be done annually as well because of BARD 1 gene mutation.  Multiple family members have been affected with breast cancer. ? ?Return to clinic in 1 year for follow-up ? ? ? ?Orders Placed This Encounter

## 2021-05-20 ENCOUNTER — Encounter: Payer: Self-pay | Admitting: Hematology and Oncology

## 2021-05-20 ENCOUNTER — Other Ambulatory Visit: Payer: Self-pay

## 2021-05-20 ENCOUNTER — Inpatient Hospital Stay (HOSPITAL_BASED_OUTPATIENT_CLINIC_OR_DEPARTMENT_OTHER): Payer: BC Managed Care – PPO | Admitting: Hematology and Oncology

## 2021-05-20 DIAGNOSIS — Z170 Estrogen receptor positive status [ER+]: Secondary | ICD-10-CM | POA: Diagnosis not present

## 2021-05-20 DIAGNOSIS — C50412 Malignant neoplasm of upper-outer quadrant of left female breast: Secondary | ICD-10-CM | POA: Diagnosis not present

## 2021-05-20 DIAGNOSIS — Z853 Personal history of malignant neoplasm of breast: Secondary | ICD-10-CM | POA: Diagnosis not present

## 2021-05-20 MED ORDER — DESVENLAFAXINE SUCCINATE ER 100 MG PO TB24: 100.0000 mg | ORAL_TABLET | Freq: Every day | ORAL | 3 refills | Status: DC

## 2021-05-20 NOTE — Assessment & Plan Note (Addendum)
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 ?Current treatment: Anastrozole started 10/24/2019 discontinued 05/20/2021 due to multiple side effects including hot flashes, joint stiffness, mood swings, worsening anxiety, weight gain etc. ?Because the final pathology was triple negative I did not think she would benefit from antiestrogen therapy. ? ?Breast cancer surveillance: ?1.  Breast exam 05/20/2021: Benign ?2. mammogram 05/03/2021: Benign breast density category ?3.  Breast MRIs need to be done annually as well because of BARD 1 gene mutation.  Multiple family members have been affected with breast cancer. ? ?Return to clinic in 1 year for follow-up ?

## 2021-05-21 ENCOUNTER — Telehealth: Payer: Self-pay | Admitting: Hematology and Oncology

## 2021-05-21 NOTE — Telephone Encounter (Signed)
Scheduled appointment per 3/23 los. Patient is aware. ?

## 2021-06-09 ENCOUNTER — Encounter: Payer: Self-pay | Admitting: Adult Health

## 2021-06-09 ENCOUNTER — Ambulatory Visit (INDEPENDENT_AMBULATORY_CARE_PROVIDER_SITE_OTHER): Payer: BC Managed Care – PPO | Admitting: Adult Health

## 2021-06-09 VITALS — BP 130/70 | HR 62 | Ht 65.0 in | Wt 309.0 lb

## 2021-06-09 DIAGNOSIS — F410 Panic disorder [episodic paroxysmal anxiety] without agoraphobia: Secondary | ICD-10-CM | POA: Diagnosis not present

## 2021-06-09 DIAGNOSIS — F422 Mixed obsessional thoughts and acts: Secondary | ICD-10-CM

## 2021-06-09 DIAGNOSIS — G4700 Insomnia, unspecified: Secondary | ICD-10-CM

## 2021-06-09 DIAGNOSIS — F411 Generalized anxiety disorder: Secondary | ICD-10-CM

## 2021-06-09 DIAGNOSIS — F331 Major depressive disorder, recurrent, moderate: Secondary | ICD-10-CM

## 2021-06-09 NOTE — Progress Notes (Signed)
[facility] MD/PA/NP Initial Note ? ?06/09/2021 12:08 PM ?Loretta [facility]  ?MRN:  [id number] ? ?Chief Complaint:  ? ?HPI: ? ?Patient seen today for initial psychiatric evaluation. ? ?Reports mental health issues started right after college. ? ?Describes mood today as "not so good". Pleasant. Tearful at times. Mood symptoms - reports depression, anxiety, and irritability. Increased worry and rumination. Reports intrusive thoughts and ritualistic behaviors. Stating "I over do everything". Reports panic attacks. Mood is lower - having some happy moments. Reports increased stressors in home and work setting. PCP recently discontinued her Celexa and started her on Pristiq two weeks ago - now at 168m daily. Unsure how she is feeling with the switch of medications and heightened symptoms. Stating "I just want to be able to function again". Currently on a leave of absence from work - works for [employer] Plans to return back to work next school year. Mother living with her since 284- serves as her care giver. Has 2 daughters at home - college and graduate school. Recovering from breast cancer - recently stopped an estrogen blocker. Willing to see a therapist. Stable interest and motivation. Taking medications as prescribed.  ?Energy levels lower. Active, does not have a regular exercise routine. Trying to walk 30 minutes every other day. ?Enjoys some usual interests and activities. Divorced. Has 2 daughters and mother living at home with her. Spending time with family. ?Appetite decreased - some night time binge eating. Weight fluctuates - 309 pounds today. ?Sleeps well most nights. Averages 6 hours - getting 9 hours some nights waking up early and others can't get back to sleep. ?Focus and concentration difficulties. Completing tasks. Managing aspects of household. Works for [employer]- out of work currently. ?Denies SI or HI.  ?Denies AH or VH. ?Denies self harm. ? ?Previous medication trials:  Celexa ? ?Visit Diagnosis:  ?   ICD-10-CM   ?1. Generalized anxiety disorder  F41.1   ?  ?2. Insomnia, unspecified type  G47.00   ?  ?3. Major depressive disorder, recurrent episode, moderate (HCC)  F33.1   ?  ?4. Panic attacks  F41.0   ?  ?5. Mixed obsessional thoughts and acts  F42.2   ?  ? ? ?Past Psychiatric History: Denies psychiatric hospitalization.  ? ?Past Medical History:  ?Past Medical History:  ?Diagnosis Date  ? Allergic rhinitis   ? BARD1 gene mutation positive 04/19/2019  ? Complication of anesthesia   ? woke up during EGD  ? Dysphagia   ? Empty sella syndrome   ? Essential hypertension   ? Family history of breast cancer   ? Fibroids, intramural 02/17/2017  ? Gastroesophageal reflux disease   ? Generalized anxiety disorder   ? Graves disease 01/13/11  ? radioactive iodine treatment, 185.2millicuries  ? Hiatal hernia   ? small  ? History of chemotherapy   ? History of hysterectomy 02/17/2017  ? History of migraine headaches   ? History of radiation therapy   ? Iron deficiency anemia   ? 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 Meralgia paresthetica   ? Murmur   ? benign, h/o, caused by hyperdynamic contraction  ? Obesity   ? Obstructive sleep apnea   ? Pneumonia 2017  ? inhaler prescribed  ? Postoperative hypothyroidism   ? 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 Tendonitis of shoulder, right   ? Vitamin D deficiency   ?  ?Past Surgical History:  ?Procedure Laterality  Date  ? ABDOMINAL HYSTERECTOMY  2018  ? BALLOON DILATION N/A 09/07/2016  ? Procedure: BALLOON DILATION;  Surgeon: Arta Silence, MD;  Location: [facility] ENDOSCOPY;  Service: Endoscopy;  Laterality: N/A;  ? BREAST LUMPECTOMY Left 03/2019  ? BREAST LUMPECTOMY WITH RADIOACTIVE SEED AND SENTINEL LYMPH NODE BIOPSY Left 04/17/2019  ? Procedure: LEFT BREAST LUMPECTOMY WITH RADIOACTIVE SEED AND SENTINEL LYMPH NODE BIOPSY;  Surgeon: Donnie Mesa, MD;  Location: [facility];  Service: General;  Laterality: Left;   LMA, PEC BLOCK  ? [place]  ? COLONOSCOPY    ? DILATION AND CURETTAGE OF UTERUS    ? ESOPHAGOGASTRODUODENOSCOPY    ? ESOPHAGOGASTRODUODENOSCOPY (EGD) WITH PROPOFOL N/A 09/07/2016  ? Procedure: ESOPHAGOGASTRODUODENOSCOPY (EGD) WITH PROPOFOL;  Surgeon: Arta Silence, MD;  Location: [facility] ENDOSCOPY;  Service: Endoscopy;  Laterality: N/A;  ? HYSTERECTOMY ABDOMINAL WITH SALPINGECTOMY Bilateral 02/17/2017  ? Procedure: HYSTERECTOMY ABDOMINAL WITH SALPINGECTOMY;  Surgeon: Christophe Louis, MD;  Location: [facility];  Service: Gynecology;  Laterality: Bilateral;  ? KNEE ARTHROSCOPY    ? for torn meniscus  ? LAPAROSCOPIC SALPINGO OOPHERECTOMY N/A 02/12/2020  ? Procedure: DIAGNOSTIC LAPAROSCOPY ;  Surgeon: Christophe Louis, MD;  Location: [facility];  Service: Gynecology;  Laterality: N/A;  ? LAPAROTOMY Bilateral 02/12/2020  ? Procedure: EXPLORATORY LAPAROTOMY WITH BILATERAL SALPINGO OOPHORECTOMY;  Surgeon: Christophe Louis, MD;  Location: [facility];  Service: Gynecology;  Laterality: Bilateral;  ? LYSIS OF ADHESION N/A 02/12/2020  ? Procedure: LYSIS OF ADHESION;  Surgeon: Christophe Louis, MD;  Location: [facility];  Service: Gynecology;  Laterality: N/A;  ? PORT-A-CATH REMOVAL Right 01/07/2020  ? Procedure: REMOVAL PORT-A-CATH;  Surgeon: Donnie Mesa, MD;  Location: [facility];  Service: General;  Laterality: Right;  ? PORTACATH PLACEMENT Right 05/22/2019  ? Procedure: INSERTION PORT-A-CATH WITH ULTRASOUND GUIDANCE;  Surgeon: Donnie Mesa, MD;  Location: [facility];  Service: General;  Laterality: Right;  Endotrachial tube  ? WISDOM TOOTH EXTRACTION    ? WISDOM TOOTH EXTRACTION    ? ? ?Family Psychiatric History: Denies any family history of mental illness.  ? ?Family History:  ?Family History  ?Problem Relation Age of Onset  ? Hypertension Mother   ? Alcoholism Father   ? CAD Maternal Grandfather   ? Breast cancer Paternal Grandmother   ? HIV Brother   ? Heart attack Brother   ?     sudden MI vs PE  ? Breast cancer Sister 45  ? Breast cancer Other   ?     one  dx 65s, others dx in 67s  ? Alzheimer's disease Other   ? Colon polyps Neg Hx   ? Colon cancer Neg Hx   ? Liver disease Neg Hx   ? ? ?Social History:  ?Social History  ? ?Socioeconomic History  ? Marital status: Divorced  ?  Spouse name: Not on file  ? Number of children: Not on file  ? Years of education: 3  ? Highest education level: Master's degree (e.g., MA, MS, MEng, MEd, MSW, MBA)  ?Occupational History  ? Not on file  ?Tobacco Use  ? Smoking status: Never  ? Smokeless tobacco: Never  ?Vaping Use  ? Vaping Use: Never used  ?Substance and Sexual Activity  ? Alcohol use: No  ? Drug use: No  ? Sexual activity: Not Currently  ?  Birth control/protection: Abstinence  ?Other Topics Concern  ? Not on file  ?Social History Narrative  ? Not on file  ? ?Social Determinants of Health  ? ?Financial  Resource Strain: Not on file  ?Food Insecurity: Not on file  ?Transportation Needs: Not on file  ?Physical Activity: Not on file  ?Stress: Not on file  ?Social Connections: Not on file  ? ? ?Allergies:  ?Allergies  ?Allergen Reactions  ? Tape Itching and Other (See Comments)  ?  Adhesive tape--redness/inflammation/itching  ? ? ?Metabolic Disorder Labs: ?No results found for: HGBA1C, MPG ?No results found for: PROLACTIN ?No results found for: CHOL, TRIG, HDL, CHOLHDL, VLDL, LDLCALC ?No results found for: TSH ? ?Therapeutic Level Labs: ?No results found for: LITHIUM ?No results found for: VALPROATE ?No components found for:  CBMZ ? ?Current Medications: ?Current Outpatient Medications  ?Medication Sig Dispense Refill  ? acetaminophen (TYLENOL) 500 MG tablet Take 2 tablets (1,000 mg total) by mouth every 8 (eight) hours as needed. 30 tablet 0  ? amLODipine (NORVASC) 5 MG tablet Take 5 mg by mouth daily.   5  ? Cholecalciferol 4000 UNITS CAPS Take 4,000 Units by mouth daily.     ? desvenlafaxine (PRISTIQ) 100 MG 24 hr tablet Take 1 tablet (100 mg total) by mouth daily.  3  ? diclofenac Sodium (VOLTAREN) 1 % GEL Apply 1 application  topically 4 (four) times daily as needed (knee pain.).    ? EDARBYCLOR 40-25 MG TABS Take 1 tablet by mouth daily.  5  ? ibuprofen (ADVIL) 800 MG tablet Take 1 tablet (800 mg total) by mouth every 8 (eight

## 2021-06-11 ENCOUNTER — Telehealth: Payer: Self-pay | Admitting: Adult Health

## 2021-06-11 NOTE — Telephone Encounter (Signed)
Loretta Nelson dropped off her FMLA documents to be completed. Placed in Traci's box. ?

## 2021-06-17 ENCOUNTER — Telehealth: Payer: Self-pay | Admitting: Adult Health

## 2021-06-17 DIAGNOSIS — Z0289 Encounter for other administrative examinations: Secondary | ICD-10-CM

## 2021-06-17 NOTE — Telephone Encounter (Signed)
FMLA paperwork completed, faxed and mailed copy to Pt.  ?

## 2021-06-17 NOTE — Telephone Encounter (Signed)
Paper work completed and signed and given to front office to contact pt it is ready ?

## 2021-06-25 ENCOUNTER — Other Ambulatory Visit: Payer: Self-pay | Admitting: Hematology and Oncology

## 2021-06-25 DIAGNOSIS — C50412 Malignant neoplasm of upper-outer quadrant of left female breast: Secondary | ICD-10-CM

## 2021-06-29 ENCOUNTER — Encounter: Payer: Self-pay | Admitting: Adult Health

## 2021-06-29 ENCOUNTER — Ambulatory Visit (INDEPENDENT_AMBULATORY_CARE_PROVIDER_SITE_OTHER): Payer: BC Managed Care – PPO | Admitting: Adult Health

## 2021-06-29 DIAGNOSIS — G4700 Insomnia, unspecified: Secondary | ICD-10-CM | POA: Diagnosis not present

## 2021-06-29 DIAGNOSIS — F410 Panic disorder [episodic paroxysmal anxiety] without agoraphobia: Secondary | ICD-10-CM | POA: Diagnosis not present

## 2021-06-29 DIAGNOSIS — F411 Generalized anxiety disorder: Secondary | ICD-10-CM

## 2021-06-29 DIAGNOSIS — F331 Major depressive disorder, recurrent, moderate: Secondary | ICD-10-CM | POA: Diagnosis not present

## 2021-06-29 DIAGNOSIS — F422 Mixed obsessional thoughts and acts: Secondary | ICD-10-CM

## 2021-06-29 MED ORDER — BUPROPION HCL ER (XL) 150 MG PO TB24
150.0000 mg | ORAL_TABLET | Freq: Every day | ORAL | 2 refills | Status: DC
Start: 2021-06-29 — End: 2021-07-22

## 2021-06-29 MED ORDER — TRAZODONE HCL 50 MG PO TABS: 25.0000 mg | ORAL_TABLET | Freq: Every evening | ORAL | 2 refills | Status: DC | PRN

## 2021-06-29 NOTE — Progress Notes (Signed)
Loretta Nelson ?[id number] ?Dec 06, 1965 ?56 y.o. ? ?Subjective:  ? ?Patient ID:  Loretta Nelson is a 56 y.o. (DOB 21-Mar-1965) female. ? ?Chief Complaint: No chief complaint on file. ? ? ?HPI ?[name] presents to the office today for follow-up of MDD, GAD, panic attacks, insomnia, mixed obsessional thoughts. ? ?Describes mood today as "about the same". Pleasant. Tearful at times. Mood symptoms - reports depression, anxiety, and irritability. Decreased worry and rumination - "urge still there". Reports intrusive thoughts and ritualistic behaviors - "trying to be more logical or stop thinking about things". Decreased panic attacks. Mood is still lower. Stating "I still feel like I'm still struggling". Has been trying to work on herself. Has tolerated the Pristiq and feels it has been helpful. Willing to consider other medication alternatives. Currently on a leave of absence from work - works for [employer]. Plans to return back to work next school year. Mother living with her since 80 - serves as her care giver. Has 2 daughters at home - college and graduate school. Recovering from breast cancer - recently stopped an estrogen blocker. Seeing therapist Rinaldo Cloud - starting tomorrow. Stable interest and motivation. Taking medications as prescribed.  ?Energy levels lower. Active, does not have a regular exercise routine. Walking 30 minutes every other day. ?Enjoys some usual interests and activities. Divorced. Has 2 daughters and mother living at home with her. Spending time with family. ?Appetite decreased - decreased binge eating. Weight fluctuates - 309 pounds today. ?Sleep has improved - but is unable to stay asleep. Averages 6 hours. ?Focus and concentration difficulties. Completing tasks. Managing aspects of household. Works for [employer] - out of work currently. ?Denies SI or HI.  ?Denies AH or VH. ?Denies self harm. ? ?Previous medication trials:  Celexa ? ?Mini-Mental   ? ?[place] Office Visit from  01/15/2019 in [facility]  ?Total Score (max 30 points ) 30  ? ?  ?  ? ?Review of Systems:  ?Review of Systems  ?Musculoskeletal:  Negative for gait problem.  ?Neurological:  Negative for tremors.  ?Psychiatric/Behavioral:    ?     Please refer to HPI  ? ?Medications: I have reviewed the patient's current medications. ? ?Current Outpatient Medications  ?Medication Sig Dispense Refill  ? buPROPion (WELLBUTRIN XL) 150 MG 24 hr tablet Take 1 tablet (150 mg total) by mouth daily. 30 tablet 2  ? acetaminophen (TYLENOL) 500 MG tablet Take 2 tablets (1,000 mg total) by mouth every 8 (eight) hours as needed. 30 tablet 0  ? amLODipine (NORVASC) 5 MG tablet Take 5 mg by mouth daily.   5  ? Cholecalciferol 4000 UNITS CAPS Take 4,000 Units by mouth daily.     ? desvenlafaxine (PRISTIQ) 100 MG 24 hr tablet Take 1 tablet (100 mg total) by mouth daily.  3  ? diclofenac Sodium (VOLTAREN) 1 % GEL Apply 1 application topically 4 (four) times daily as needed (knee pain.).    ? EDARBYCLOR 40-25 MG TABS Take 1 tablet by mouth daily.  5  ? ibuprofen (ADVIL) 800 MG tablet Take 1 tablet (800 mg total) by mouth every 8 (eight) hours as needed. 30 tablet 0  ? L-Methylfolate-B12-B6-B2 (CEREFOLIN) 07-29-48-5 MG TABS Take 1 tablet by mouth every morning. 90 tablet 3  ? levothyroxine (SYNTHROID) 175 MCG tablet Take 175 mcg by mouth daily before breakfast.   5  ? loratadine (CLARITIN) 10 MG tablet TAKE 1 TABLET BY MOUTH EVERY DAY 90 tablet 1  ?  metoprolol (TOPROL-XL) 200 MG 24 hr tablet Take 200 mg by mouth daily.   5  ? naproxen (NAPROSYN) 500 MG tablet Take 1 tablet by mouth every 12 (twelve) hours as needed.    ? pantoprazole (PROTONIX) 40 MG tablet Take 40 mg by mouth 2 (two) times daily.     ? potassium chloride (KLOR-CON) 20 MEQ packet Take 40 mEq by mouth daily.     ? traZODone (DESYREL) 50 MG tablet Take 0.5-1 tablets (25-50 mg total) by mouth at bedtime as needed. 30 tablet 2  ? ?No current facility-administered  medications for this visit.  ? ? ?Medication Side Effects: None ? ?Allergies:  ?Allergies  ?Allergen Reactions  ? Tape Itching and Other (See Comments)  ?  Adhesive tape--redness/inflammation/itching  ? ? ?Past Medical History:  ?Diagnosis Date  ? Allergic rhinitis   ? BARD1 gene mutation positive 04/19/2019  ? Complication of anesthesia   ? woke up during EGD  ? Dysphagia   ? Empty sella syndrome   ? Essential hypertension   ? Family history of breast cancer   ? Fibroids, intramural 02/17/2017  ? Gastroesophageal reflux disease   ? Generalized anxiety disorder   ? Graves disease 01/13/11  ? radioactive iodine treatment, 33.3 millicuries  ? Hiatal hernia   ? small  ? History of chemotherapy   ? History of hysterectomy 02/17/2017  ? History of migraine headaches   ? History of radiation therapy   ? Iron deficiency anemia   ? 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 Meralgia paresthetica   ? Murmur   ? benign, h/o, caused by hyperdynamic contraction  ? Obesity   ? Obstructive sleep apnea   ? Pneumonia 2017  ? inhaler prescribed  ? Postoperative hypothyroidism   ? 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 Tendonitis of shoulder, right   ? Vitamin D deficiency   ? ? ?Past Medical History, Surgical history, Social history, and Family history were reviewed and updated as appropriate.  ? ?Please see review of systems for further details on the patient's review from today.  ? ?Objective:  ? ?Physical Exam:  ?LMP 02/13/2017 (Exact Date)  ? ?Physical Exam ?Constitutional:   ?   General: She is not in acute distress. ?Musculoskeletal:     ?   General: No deformity.  ?Neurological:  ?   Mental Status: She is alert and oriented to person, place, and time.  ?   Coordination: Coordination normal.  ?Psychiatric:     ?   Attention and Perception: Attention and perception normal. She does not perceive auditory or visual hallucinations.     ?   Mood and Affect:  Mood normal. Mood is not anxious or depressed. Affect is not labile, blunt, angry or inappropriate.     ?   Speech: Speech normal.     ?   Behavior: Behavior normal.     ?   Thought Content: Thought content normal. Thought content is not paranoid or delusional. Thought content does not include homicidal or suicidal ideation. Thought content does not include homicidal or suicidal plan.     ?   Cognition and Memory: Cognition and memory normal.     ?   Judgment: Judgment normal.  ?   Comments: Insight intact  ? ? ?Lab Review:  ?   ?Component Value Date/Time  ? NA 142 05/17/2021 1551  ? K 3.7 05/17/2021 1551  ? CL 103  05/17/2021 1551  ? CO2 33 (H) 05/17/2021 1551  ? GLUCOSE 97 05/17/2021 1551  ? BUN 14 05/17/2021 1551  ? CREATININE 0.84 05/17/2021 1551  ? CALCIUM 9.6 05/17/2021 1551  ? PROT 7.2 05/17/2021 1551  ? ALBUMIN 4.0 05/17/2021 1551  ? AST 18 05/17/2021 1551  ? ALT 16 05/17/2021 1551  ? ALKPHOS 75 05/17/2021 1551  ? BILITOT 0.3 05/17/2021 1551  ? GFRNONAA >60 05/17/2021 1551  ? GFRAA >60 10/24/2019 1323  ? GFRAA >60 04/10/2019 1240  ? ? ?   ?Component Value Date/Time  ? WBC 6.2 05/17/2021 1551  ? WBC 5.3 11/23/2020 1140  ? RBC 4.28 05/17/2021 1551  ? HGB 11.5 (L) 05/17/2021 1551  ? HCT 36.1 05/17/2021 1551  ? PLT 187 05/17/2021 1551  ? MCV 84.3 05/17/2021 1551  ? MCH 26.9 05/17/2021 1551  ? MCHC 31.9 05/17/2021 1551  ? RDW 13.2 05/17/2021 1551  ? LYMPHSABS 1.8 05/17/2021 1551  ? MONOABS 0.5 05/17/2021 1551  ? EOSABS 0.3 05/17/2021 1551  ? BASOSABS 0.0 05/17/2021 1551  ? ? ?No results found for: POCLITH, LITHIUM  ? ?No results found for: PHENYTOIN, PHENOBARB, VALPROATE, CBMZ  ? ?.res ?Assessment: Plan:   ? ?Plan: ? ?PDMP reviewed ? ?Add Wellbutrin XL142m - denies seizure history. ?Pristiq 1054mdaily ?Trazadone 5048mt hs ? ?Consider Rexulti next visit ? ?Set up with therapist ? ?Time spent with patient was 25 minutes. Greater than 50% of face to face time with patient was spent on counseling and coordination  of care.   ? ?RTC 4 weeks ? ?Patient advised to contact office with any questions, adverse effects, or acute worsening in signs and symptoms. ?Diagnoses and all orders for this visit: ? ?Generalized anxiety disorder ?

## 2021-06-30 ENCOUNTER — Ambulatory Visit (INDEPENDENT_AMBULATORY_CARE_PROVIDER_SITE_OTHER): Payer: BC Managed Care – PPO | Admitting: Psychiatry

## 2021-06-30 ENCOUNTER — Ambulatory Visit
Admission: RE | Admit: 2021-06-30 | Discharge: 2021-06-30 | Disposition: A | Discharge status: Home / Self Care | Payer: BC Managed Care – PPO | Source: Ambulatory Visit | Attending: Oncology | Admitting: Oncology

## 2021-06-30 DIAGNOSIS — F411 Generalized anxiety disorder: Secondary | ICD-10-CM | POA: Diagnosis not present

## 2021-06-30 DIAGNOSIS — C50412 Malignant neoplasm of upper-outer quadrant of left female breast: Secondary | ICD-10-CM

## 2021-06-30 NOTE — Progress Notes (Signed)
[facility] Counselor Initial Adult Exam ? ?Name: Loretta Nelson ?Date: 06/30/2021 ?MRN: [id number] ?DOB: [birth year] ?PCP: [name], MD ? ?Time spent: 60 minutes ? ? ?Guardian/Payee:  patient   ? ?Paperwork requested:  No  ? ?Reason for Visit /Presenting Problem: depression, anxiety, sadness, obsessive, overthinking, I'm a worrier, sometimes high and lows in mood, some prior therapy a "long time ago" , lots of issues as Therapist, sports in [employer]; Is a breast cancer survivor (2021-2022); "Really need to get my anxiety under control.", low energy and some lower motivation  ? ?Mental Status Exam: ?  ? ?Appearance:   Casual     ?Behavior:  Appropriate, Sharing, and Motivated  ?Motor:  Normal  ?Speech/Language:   Clear and Coherent  ?Affect:  Depressed and anxious  ?Mood:  anxious and depressed  ?Thought process:  goal directed  ?Thought content:    Obsessions and overthinking  ?Sensory/Perceptual disturbances:    WNL  ?Orientation:  oriented to person, place, time/date, situation, day of week, month of year, year, and stated date of Jun 30, 2021  ?Attention:  Focusing can be a challenge at times  ?Concentration:  Good and Fair  ?Memory:  Not too bad and neurologist has said it was related to her chemo  ?Fund of knowledge:   Good  ?Insight:    Good  ?Judgment:   Good  ?Impulse Control:  Good  ? ?Reported Symptoms:  see symptoms above ? ?Risk Assessment: ?Danger to Self:  No ?Self-injurious Behavior: No ?Danger to Others: No ?Duty to Warn:no ?Physical Aggression / Violence:No  ?Access to Firearms a concern: No  ?Gang Involvement:No  ?Patient / guardian was educated about steps to take if suicide or homicide risk level increases between visits: Denies any SI or HI. ?While future psychiatric events cannot be accurately predicted, the patient does not currently require acute inpatient psychiatric care and does not currently meet [facility] involuntary commitment criteria. ? ?Substance Abuse History: ?Current substance  abuse: No    ? ?Past Psychiatric History:   ?Previous psychological history is significant for anxiety and depression ?Outpatient Providers:out of town provider (over 20 yrs ago) ?History of Psych Hospitalization: No  ?Psychological Testing:  n/a   ? ?Abuse History: ?Victim of No.,  n/a    ?Report needed: No. ?Victim of Neglect:No. ?Perpetrator of  n/a   ?Witness / Exposure to Domestic Violence: No   ?Protective Services Involvement: No  ?Witness to [employer] Violence:  No  ? ?Family History: Reviewed with Patient and she confirms info below. ?Family History  ?Problem Relation Age of Onset  ? Hypertension Mother   ? Alcoholism Father   ? CAD Maternal Grandfather   ? Breast cancer Paternal Grandmother   ? HIV Brother   ? Heart attack Brother   ?     sudden MI vs PE  ? Breast cancer Sister 40  ? Breast cancer Other   ?     one dx 31s, others dx in 44s  ? Alzheimer's disease Other   ? Colon polyps Neg Hx   ? Colon cancer Neg Hx   ? Liver disease Neg Hx   ? ? ?Living situation: the patient lives with their family (her 2 daughters ages 62 yr old twins, and elderly mother who has health concerns).  Is on medical leave for rest of school year.  ? ?Sexual Orientation:  Straight ? ?Relationship Status: divorced  ?Name of spouse / other:n/a ?  If a parent, number of children / ages:2 adult twin daughters ? ?Support Systems; friends ?parents ?2 twin daughters (one in grad school  and 1 working at [employer]) ? ?Financial Stress:  Yes  ? ?Income/Employment/Disability: Employment ? ?Military Service: No  ? ?Educational History: ?Education: post Forensic psychologist work or degree ? ?Religion/Sprituality/World View:   Protestant ? ?Any cultural differences that may affect / interfere with treatment:  not applicable  ? ?Recreation/Hobbies: crafting, reading ? ?Stressors:Health problems   ?Occupational concerns  (my mom's health issues and my own health issues.) ? ?Strengths:  Supportive Relationships, Family, Friends,  Church, Spirituality, Hopefulness, Conservator, museum/gallery, and Able to Communicate Effectively ? ?Barriers:  the anxiety and not being able to get that under control  ? ?Legal History: ?Pending legal issue / charges: The patient has no significant history of legal issues. ?History of legal issue / charges:  n/a ? ?Medical History/Surgical History:Reviewed with patient and she confirms info below. ?Past Medical History:  ?Diagnosis Date  ? Allergic rhinitis   ? BARD1 gene mutation positive 04/19/2019  ? Complication of anesthesia   ? woke up during EGD  ? Dysphagia   ? Empty sella syndrome   ? Essential hypertension   ? Family history of breast cancer   ? Fibroids, intramural 02/17/2017  ? Gastroesophageal reflux disease   ? Generalized anxiety disorder   ? Graves disease 01/13/11  ? radioactive iodine treatment, 50.5 millicuries  ? Hiatal hernia   ? small  ? History of chemotherapy   ? History of hysterectomy 02/17/2017  ? History of migraine headaches   ? History of radiation therapy   ? Iron deficiency anemia   ? 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 Meralgia paresthetica   ? Murmur   ? benign, h/o, caused by hyperdynamic contraction  ? Obesity   ? Obstructive sleep apnea   ? Pneumonia 2017  ? inhaler prescribed  ? Postoperative hypothyroidism   ? 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 Tendonitis of shoulder, right   ? Vitamin D deficiency   ? ? ?Past Surgical History:  ?Procedure Laterality Date  ? ABDOMINAL HYSTERECTOMY  2018  ? BALLOON DILATION N/A 09/07/2016  ? Procedure: BALLOON DILATION;  Surgeon: Arta Silence, MD;  Location: [facility] ENDOSCOPY;  Service: Endoscopy;  Laterality: N/A;  ? BREAST LUMPECTOMY Left 03/2019  ? BREAST LUMPECTOMY WITH RADIOACTIVE SEED AND SENTINEL LYMPH NODE BIOPSY Left 04/17/2019  ? Procedure: LEFT BREAST LUMPECTOMY WITH RADIOACTIVE SEED AND SENTINEL LYMPH NODE BIOPSY;  Surgeon: Donnie Mesa, MD;  Location: [place];   Service: General;  Laterality: Left;  LMA, PEC BLOCK  ? [place]  ? COLONOSCOPY    ? DILATION AND CURETTAGE OF UTERUS    ? ESOPHAGOGASTRODUODENOSCOPY    ? ESOPHAGOGASTRODUODENOSCOPY (EGD) WITH PROPOFOL N/A 09/07/2016  ? Procedure: ESOPHAGOGASTRODUODENOSCOPY (EGD) WITH PROPOFOL;  Surgeon: Arta Silence, MD;  Location: [facility] ENDOSCOPY;  Service: Endoscopy;  Laterality: N/A;  ? HYSTERECTOMY ABDOMINAL WITH SALPINGECTOMY Bilateral 02/17/2017  ? Procedure: HYSTERECTOMY ABDOMINAL WITH SALPINGECTOMY;  Surgeon: Christophe Louis, MD;  Location: [facility];  Service: Gynecology;  Laterality: Bilateral;  ? KNEE ARTHROSCOPY    ? for torn meniscus  ? LAPAROSCOPIC SALPINGO OOPHERECTOMY N/A 02/12/2020  ? Procedure: DIAGNOSTIC LAPAROSCOPY ;  Surgeon: Christophe Louis, MD;  Location: [place];  Service: Gynecology;  Laterality: N/A;  ? LAPAROTOMY Bilateral 02/12/2020  ? Procedure: EXPLORATORY LAPAROTOMY WITH BILATERAL SALPINGO OOPHORECTOMY;  Surgeon:  Christophe Louis, MD;  Location: [place];  Service: Gynecology;  Laterality: Bilateral;  ? LYSIS OF ADHESION N/A 02/12/2020  ? Procedure: LYSIS OF ADHESION;  Surgeon: Christophe Louis, MD;  Location: [place];  Service: Gynecology;  Laterality: N/A;  ? PORT-A-CATH REMOVAL Right 01/07/2020  ? Procedure: REMOVAL PORT-A-CATH;  Surgeon: Donnie Mesa, MD;  Location: [place];  Service: General;  Laterality: Right;  ? PORTACATH PLACEMENT Right 05/22/2019  ? Procedure: INSERTION PORT-A-CATH WITH ULTRASOUND GUIDANCE;  Surgeon: Donnie Mesa, MD;  Location: [facility];  Service: General;  Laterality: Right;  Endotrachial tube  ? WISDOM TOOTH EXTRACTION    ? WISDOM TOOTH EXTRACTION    ? ? ?Medications: ?Current Outpatient Medications  ?Medication Sig Dispense Refill  ? acetaminophen (TYLENOL) 500 MG tablet Take 2 tablets (1,000 mg total) by mouth every 8 (eight) hours as needed. 30 tablet 0  ? amLODipine (NORVASC) 5 MG tablet Take 5 mg by mouth daily.   5  ? buPROPion (WELLBUTRIN XL) 150 MG 24 hr tablet Take 1 tablet (150 mg  total) by mouth daily. 30 tablet 2  ? Cholecalciferol 4000 UNITS CAPS Take 4,000 Units by mouth daily.     ? desvenlafaxine (PRISTIQ) 100 MG 24 hr tablet Take 1 tablet (100 mg total) by mouth daily.  3  ?

## 2021-07-20 ENCOUNTER — Encounter: Payer: Self-pay | Admitting: Family Medicine

## 2021-07-21 ENCOUNTER — Other Ambulatory Visit: Payer: Self-pay | Admitting: Adult Health

## 2021-07-21 DIAGNOSIS — F331 Major depressive disorder, recurrent, moderate: Secondary | ICD-10-CM

## 2021-07-21 DIAGNOSIS — F411 Generalized anxiety disorder: Secondary | ICD-10-CM

## 2021-07-21 NOTE — Telephone Encounter (Signed)
Received this update from James/Adapt: "Yes, I spoke with her, and she has agreed to stay and work with [place]. I have her a one-on-one meeting being arranged to figure her billing out."

## 2021-07-22 ENCOUNTER — Ambulatory Visit: Payer: BC Managed Care – PPO | Admitting: Psychiatry

## 2021-07-22 DIAGNOSIS — F411 Generalized anxiety disorder: Secondary | ICD-10-CM | POA: Diagnosis not present

## 2021-07-22 NOTE — Progress Notes (Signed)
[facility] Counselor/Therapist Progress Note  Patient ID: Loretta Nelson, MRN: [id number],    Date: 07/22/2021  Time Spent: 58 minutes   Treatment Type: Individual Therapy  Reported Symptoms:  Anxious, some depression, some highs and lows but medication seems to be helping  Mental Status Exam:  Appearance:   Casual     Behavior:  Appropriate, Sharing, and Motivated  Motor:  Normal  Speech/Language:   Clear and Coherent  Affect:  Anxious, some depression (getting better)  Mood:  anxious and some depression  Thought process:  goal directed  Thought content:    Some overthinking ; starting to interrupt those thoughts  Sensory/Perceptual disturbances:    WNL  Orientation:  oriented to person, place, time/date, situation, day of week, month of year, year, and stated date of Jul 22, 2021  Attention:  Good but mind does wander at times  Concentration:  Good  Memory:  Previously was not but 11/2 yr ago had some chemo and memory has been a bit off at times  [employer] of knowledge:   Good  Insight:    Good  Judgment:   Good  Impulse Control:  Good   Risk Assessment: Danger to Self:  No Self-injurious Behavior: No Danger to Others: No Duty to Warn:no Physical Aggression / Violence:No  Access to Firearms a concern: No  Gang Involvement:No   Subjective: Patient today reporting anxiety and some depression although depression is better. Feels her meds are helping. Main concerns are her own health concerns and the health concerns of her mom with whom patient lives. Patient's brother died suddenly of heart attack in 2015 and he had been living with mother, so after he died patient has been living with mom. Mom had high level of anxiety as well. Exploring options for the future, work, Social research officer, government. Difficulties/stressors at work discussed in more detail, and especially how patient has been impacted. (Not all details included in this note due to patient privacy needs.) Some sadness about how things  played out on her job before she went out on leave. 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 Overthinks. Lowered energy and motivation although trying to be more motivated. Does have a sense of hopefulness and having some faith in herself.Anxiety and depression have decreased.   Interventions: Solution-Oriented/Positive Psychology, Ego-Supportive, and Insight-Oriented  Treatment goal plan: Patient not signing treatment plan due to Covid. Treatment goals: Treatment goals remain on treatment plan as patient works with strategies to achieve her goals. Progress is assessed each session and documented in the "subject" and/or "plan" section of treatment note. Long-term goal: Reduce overall level, frequency, and intensity of the anxiety so that daily functioning is not impaired.  Short-term goal: Verbalize an understanding of the role that fearful thinking plays in creating fears, excessive worry, and persistent anxiety symptoms. Strategies: Strengthen her new nonavoidang approach and build confidence.     Diagnosis:   ICD-10-CM   1. Generalized anxiety disorder  F41.1      Plan:  Patient today showing good participation and motivation as she worked in session on her anxiety, depression, self-esteem, and issues from prior employment, all of which are improving some.  Feels medication is definitely helping.  Talks freely in session and able to share some helpful information in working with her.  Overthinking continues but she is becoming able to interrupt it at times.  Dealt with some sadness today regarding occurrences at job.  Ended session by acknowledging she does have some hopefulness at this point  and increased faith in herself as she continues working with goal-directed behaviors to feel better and make good decisions about moving forward. Urged patient in her practice of more positive behaviors including: Staying in the present and focusing on what she can control or change, getting outside daily and walking,  staying in touch with people who are supportive, look for more positives versus negatives each day, positive self talk, healthy nutrition and exercise, stop assuming worst case scenarios, use daily affirmations, stop self negating, reduce overthinking and over analyzing, challenge and counteract her self doubt, be involved in more things that she enjoys including being creative and her shop at home, saying no without feeling guilty, allowing her faith to be an emotional support as well as spiritual, and recognize the strength she shows as she works with goal-directed behaviors to move in a direction that supports increased self-confidence and improved emotional health.  Goal review and progress/challenges noted with patient.  Next appointment within 2 to 3 weeks.  This record has been created using [employer].  Chart creation errors have been sought, but may not always have been located and corrected.  Such creation errors do not reflect on the standard of medical care provided.   Shanon Ace, LCSW

## 2021-07-30 ENCOUNTER — Telehealth: Payer: Self-pay | Admitting: Adult Health

## 2021-07-30 NOTE — Telephone Encounter (Signed)
[name] stopped by and dropped off an FMLA form for completion by 08/06/21. Placed on Traci's desk. Breea will pick the form up.

## 2021-08-03 ENCOUNTER — Other Ambulatory Visit: Payer: Self-pay | Admitting: Oncology

## 2021-08-04 ENCOUNTER — Ambulatory Visit: Payer: BC Managed Care – PPO | Admitting: Psychiatry

## 2021-08-04 DIAGNOSIS — F411 Generalized anxiety disorder: Secondary | ICD-10-CM | POA: Diagnosis not present

## 2021-08-04 NOTE — Progress Notes (Signed)
[facility] Counselor/Therapist Progress Note  Patient ID: Loretta Nelson, MRN: [id number],    Date: 08/04/2021  Time Spent: 57 minutes   Treatment Type: Individual Therapy  Reported Symptoms: anxiety, sleep is better, "some ups and downs", nervousness sometimes in groups of people  Mental Status Exam:  Appearance:   Casual     Behavior:  Appropriate, Sharing, and Motivated  Motor:  Normal  Speech/Language:   Clear and Coherent  Affect:  anxious  Mood:  anxious and some depression  Thought process:  goal directed  Thought content:    WNL  Sensory/Perceptual disturbances:    WNL  Orientation:  oriented to person, place, time/date, situation, day of week, month of year, year, and stated date of August 04, 2021  Attention:  Good  Concentration:  Good  Memory:  Some short term memory issues and most likely related to   [employer] of knowledge:   Good  Insight:    Good  Judgment:   Good  Impulse Control:  Good   Risk Assessment: Danger to Self:  No Self-injurious Behavior: No Danger to Others: No Duty to Warn:no Physical Aggression / Violence:No  Access to Firearms a concern: No  Gang Involvement:No   Subjective:  Patient today reporting anxiety, some ups and downs, nervous around some people, some tearfulness, sleep is better. Needed session today to process lots of thoughts and feelings. 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 Exploring other options re: her work. "Trying to not worry so much." Talked  through a lot of concerns and did seem better by session end. (Not all details included in note due to patient privacy needs.) At session end, patient actually able to laugh some, see things more clearly, and to understand this is a process for her and not a permanent state.  Discussed strategies for better managing her anxiety such as taking needed breaks during the day or night, getting adequate sleep which she reports her sleep is  better, having contact with people who are supportive, deep breathing exercises especially with increased anxiety, and also developing the skill of looking for what might go right versus wrong and accentuating the positives that do happen, not assuming the worst, and believing in herself as she gets through this time.  Self-rates as a "7" on  1-10 anxiety scale and notes how her rating can ebb and flow depending on what she is doing or thinking.  Continues to consider other options for work both with in the system she is currently in and also outside of that, and is focusing more on what would be good for her and not just accept anything.  Seems today to convert some of her worrying and to really talking through things that have stressed her, frustrated her, and fueled her worrying over time.  Interventions: Solution-Oriented/Positive Psychology, Ego-Supportive, and Insight-Oriented  Treatment goal plan: Patient not signing treatment plan due to Covid. Treatment goals: Treatment goals remain on treatment plan as patient works with strategies to achieve her goals. Progress is assessed each session and documented in the "subject" and/or "plan" section of treatment note. Long-term goal: Reduce overall level, frequency, and intensity of the anxiety so that daily functioning is not impaired.  Short-term goal: Verbalize an understanding of the role that fearful thinking plays in creating fears, excessive worry, and persistent anxiety symptoms. Strategies: Strengthen her new nonavoidang approach and build confidence.   Diagnosis:   ICD-10-CM  1. Generalized anxiety disorder  F41.1      Plan: Patient today showing good motivation and participation in session as she focused on her anxiety,, nervousness, and some depressed feelings, along with some ups and downs as she goes through this time of being away from work, paying attention to her needs more carefully, getting support, and looking at decisions  that she wants to make in order to act in her best interest.  She reports that her sleep is better which is definitely a positive.  Some tearfulness but not as much today.  Trying not to worry as much and did well in talking through some concerns about how she is feeling personally and trying not to see her being out of work as a negative and instead as a time for her to have increased self-care as she works through issues of anxiety, depression, hurt in order to feel better and make good decisions for her future.  Discussed several options that she has for the future and realize that she has some time to think about them.  Denies any SI.  Some overthinking but it does seem to be less as she talks today and she seems to be able to catch it and process at times.  Seems to be more hopeful today.  Still having some sadness regarding work situations but also showing some confidence in herself to move forward and what ever direction she decides might be in her best interest.  (Not all details included in this note due to patient privacy needs). Encouraged patient in practicing more positive behaviors including: Getting outside daily and walking, staying in the present and focusing on what she can control or change, staying in touch with people who are supportive, stop assuming worst case scenarios, look for more positives versus negatives daily, positive self talk, healthy nutrition and exercise, daily affirmations, stop self negating, reduce overthinking and over analyzing, challenge and counteract her self doubt, be involved in more things that she enjoys including creative projects in her shop at home, saying no without feeling guilty, allowing her faith to be an emotional support as well as spiritual, and recognize the strength she shows working with goal directed behaviors to move in a direction that supports her improved emotional health.  Goal review and progress/challenges noted with patient.  Next  appointment within 2 to 3 weeks.  This record has been created using [employer].  Chart creation errors have been sought, but may not always have been located and corrected.  Such creation errors do not reflect on the standard of medical care provided.   Shanon Ace, LCSW

## 2021-08-05 ENCOUNTER — Ambulatory Visit (INDEPENDENT_AMBULATORY_CARE_PROVIDER_SITE_OTHER): Payer: BC Managed Care – PPO | Admitting: Adult Health

## 2021-08-05 ENCOUNTER — Encounter: Payer: Self-pay | Admitting: Adult Health

## 2021-08-05 DIAGNOSIS — G4700 Insomnia, unspecified: Secondary | ICD-10-CM

## 2021-08-05 DIAGNOSIS — F411 Generalized anxiety disorder: Secondary | ICD-10-CM

## 2021-08-05 DIAGNOSIS — F422 Mixed obsessional thoughts and acts: Secondary | ICD-10-CM | POA: Diagnosis not present

## 2021-08-05 DIAGNOSIS — Z0289 Encounter for other administrative examinations: Secondary | ICD-10-CM

## 2021-08-05 DIAGNOSIS — F410 Panic disorder [episodic paroxysmal anxiety] without agoraphobia: Secondary | ICD-10-CM

## 2021-08-05 DIAGNOSIS — F331 Major depressive disorder, recurrent, moderate: Secondary | ICD-10-CM | POA: Diagnosis not present

## 2021-08-05 MED ORDER — BUPROPION HCL ER (XL) 300 MG PO TB24: 300.0000 mg | ORAL_TABLET | Freq: Every day | ORAL | 2 refills | Status: DC

## 2021-08-05 MED ORDER — DESVENLAFAXINE SUCCINATE ER 100 MG PO TB24: 100.0000 mg | ORAL_TABLET | Freq: Every day | ORAL | 2 refills | Status: DC

## 2021-08-05 MED ORDER — TRAZODONE HCL 50 MG PO TABS: 25.0000 mg | ORAL_TABLET | Freq: Every evening | ORAL | 2 refills | Status: DC | PRN

## 2021-08-05 NOTE — Progress Notes (Signed)
Loretta Nelson [id number] [birth year] 56 y.o.  Subjective:   Patient ID:  Loretta Nelson is a 56 y.o. (DOB 07-06-1965) female.  Chief Complaint: No chief complaint on file.   HPI Amberleigh Gerken Metter presents to the office today for follow-up of MDD, GAD, panic attacks, insomnia, mixed obsessional thoughts.  Describes mood today as "about the same". Pleasant. Tearful at times. Mood symptoms - reports depression, anxiety and irritability. Reports worry and rumination. Reports intrusive thoughts and ritualistic behaviors. Denies recent panic attacks - away from the stressors of work. Mood is better at times - trying to be positive. Still having moments of questioning why she feels the way she does. Trying to keep family from worrying about her - has spoken with daughters. Mood remains lower. Feels like the addition of Wellbutrin has been helpful - willing to increase the dose. Currently on a leave of absence from work - works for [employer] and does not feel like she is able to return to work at this time.  Recovering from breast cancer.  Mother living with her since 60 - serves as her care giver. Has 2 daughters at home - college and graduate school. Recovering from breast cancer - recently stopped an estrogen blocker. Seeing therapist Rinaldo Cloud. Stable interest and motivation. Taking medications as prescribed.    Energy levels lower. Active, does not have a regular exercise routine. Walking 30 minutes 3 to 4 times a week. Enjoys some usual interests and activities. Divorced. Has 2 daughters and mother living at home with her. Spending time with family. Appetite adequate - decreased binge eating. Weight loss 4 pounds - 305 pounds today. Sleep has improved with Trazadone. Averages 6 hours - the quality is better. Focus and concentration difficulties. Stating "I'm having a difficult time multi-tasking". Difficulties completing tasks. Managing aspects of household. Works for [employer] - out of work  currently. Denies SI or HI.  Denies AH or VH. Denies self harm.  Previous medication trials:  [place] Office Visit from 01/15/2019 in [facility]  Total Score (max 30 points ) 30        Review of Systems:  Review of Systems  Musculoskeletal:  Negative for gait problem.  Neurological:  Negative for tremors.  Psychiatric/Behavioral:         Please refer to HPI    Medications: I have reviewed the patient's current medications.  Current Outpatient Medications  Medication Sig Dispense Refill   acetaminophen (TYLENOL) 500 MG tablet Take 2 tablets (1,000 mg total) by mouth every 8 (eight) hours as needed. 30 tablet 0   amLODipine (NORVASC) 5 MG tablet Take 5 mg by mouth daily.   5   buPROPion (WELLBUTRIN XL) 150 MG 24 hr tablet TAKE 1 TABLET BY MOUTH EVERY DAY 90 tablet 0   Cholecalciferol 4000 UNITS CAPS Take 4,000 Units by mouth daily.      desvenlafaxine (PRISTIQ) 100 MG 24 hr tablet Take 1 tablet (100 mg total) by mouth daily.  3   diclofenac Sodium (VOLTAREN) 1 % GEL Apply 1 application topically 4 (four) times daily as needed (knee pain.).     EDARBYCLOR 40-25 MG TABS Take 1 tablet by mouth daily.  5   ibuprofen (ADVIL) 800 MG tablet Take 1 tablet (800 mg total) by mouth every 8 (eight) hours as needed. 30 tablet 0   L-Methylfolate-B12-B6-B2 (CEREFOLIN) 07-29-48-5 MG TABS Take 1 tablet by mouth every morning. 90 tablet 3  levothyroxine (SYNTHROID) 175 MCG tablet Take 175 mcg by mouth daily before breakfast.   5   loratadine (CLARITIN) 10 MG tablet TAKE 1 TABLET BY MOUTH EVERY DAY 90 tablet 1   metoprolol (TOPROL-XL) 200 MG 24 hr tablet Take 200 mg by mouth daily.   5   naproxen (NAPROSYN) 500 MG tablet Take 1 tablet by mouth every 12 (twelve) hours as needed.     pantoprazole (PROTONIX) 40 MG tablet Take 40 mg by mouth 2 (two) times daily.      potassium chloride (KLOR-CON) 20 MEQ packet Take 40 mEq by mouth daily.       traZODone (DESYREL) 50 MG tablet Take 0.5-1 tablets (25-50 mg total) by mouth at bedtime as needed. 30 tablet 2   No current facility-administered medications for this visit.    Medication Side Effects: None  Allergies:  Allergies  Allergen Reactions   Tape Itching and Other (See Comments)    Adhesive tape--redness/inflammation/itching    Past Medical History:  Diagnosis Date   Allergic rhinitis    BARD1 gene mutation positive 5/00/3704   Complication of anesthesia    woke up during EGD   Dysphagia    Empty sella syndrome    Essential hypertension    Family history of breast cancer    Fibroids, intramural 02/17/2017   Gastroesophageal reflux disease    Generalized anxiety disorder    Graves disease 01/13/11   radioactive iodine treatment, 88.8 millicuries   Hiatal hernia    small   History of chemotherapy    History of hysterectomy 02/17/2017   History of migraine headaches    History of radiation therapy    Iron deficiency anemia    Malignant neoplasm of upper-outer quadrant of left breast in female, 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   Please see review of systems for further details on the patient's review from today.   Objective:   Physical Exam:  LMP 02/13/2017 (Exact Date)   Physical Exam Constitutional:      General: She is not in acute distress. Musculoskeletal:        General: No deformity.  Neurological:     Mental Status: She is alert and oriented to person, place, and time.     Coordination: Coordination normal.  Psychiatric:         Attention and Perception: Attention and perception normal. She does not perceive auditory or visual hallucinations.        Mood and Affect: Mood normal. Mood is not anxious or depressed. Affect is not labile, blunt, angry or inappropriate.        Speech: Speech normal.        Behavior: Behavior normal.        Thought Content: Thought content normal. Thought content is not paranoid or delusional. Thought content does not include homicidal or suicidal ideation. Thought content does not include homicidal or suicidal plan.        Cognition and Memory: Cognition and memory normal.        Judgment: Judgment normal.  Comments: Insight intact     Lab Review:     Component Value Date/Time   NA 142 05/17/2021 1551   K 3.7 05/17/2021 1551   CL 103 05/17/2021 1551   CO2 33 (H) 05/17/2021 1551   GLUCOSE 97 05/17/2021 1551   BUN 14 05/17/2021 1551   CREATININE 0.84 05/17/2021 1551   CALCIUM 9.6 05/17/2021 1551   PROT 7.2 05/17/2021 1551   ALBUMIN 4.0 05/17/2021 1551   AST 18 05/17/2021 1551   ALT 16 05/17/2021 1551   ALKPHOS 75 05/17/2021 1551   BILITOT 0.3 05/17/2021 1551   GFRNONAA >60 05/17/2021 1551   GFRAA >60 10/24/2019 1323   GFRAA >60 04/10/2019 1240       Component Value Date/Time   WBC 6.2 05/17/2021 1551   WBC 5.3 11/23/2020 1140   RBC 4.28 05/17/2021 1551   HGB 11.5 (L) 05/17/2021 1551   HCT 36.1 05/17/2021 1551   PLT 187 05/17/2021 1551   MCV 84.3 05/17/2021 1551   MCH 26.9 05/17/2021 1551   MCHC 31.9 05/17/2021 1551   RDW 13.2 05/17/2021 1551   LYMPHSABS 1.8 05/17/2021 1551   MONOABS 0.5 05/17/2021 1551   EOSABS 0.3 05/17/2021 1551   BASOSABS 0.0 05/17/2021 1551    No results found for: "POCLITH", "LITHIUM"   No results found for: "PHENYTOIN", "PHENOBARB", "VALPROATE", "CBMZ"   .res Assessment: Plan:    Plan:  PDMP reviewed  Increase Wellbutrin XL191m to 3044mdaily - denies seizure history. Pristiq 10036maily Trazadone 49m78m  hs  Consider Rexulti    Therapist - DebbWilliemae Natterme spent with patient was 25 minutes. Greater than 50% of face to face time with patient was spent on counseling and coordination of care.    Patient totally disabled and unable to work 08/11/2021 through 09/10/2021.  RTC 4 weeks  Patient advised to contact office with any questions, adverse effects, or acute worsening in signs and symptoms. There are no diagnoses linked to this encounter.   Please see After Visit Summary for patient specific instructions.  Future Appointments  Date Time Provider [place]/2023 10:00 AM DowdShanon AceSW [facility]-CP None  09/01/2021 11:00 AM DowdShanon AceSW CP-[facility] None  09/15/2021 12:00 PM DowdShanon AceSW CP-CP None  09/29/2021 12:00 PM DowdShanon AceSW CP-CP None  10/13/2021 12:00 PM DowdShanon AceSW CP-CP None  01/24/2022  3:00 PM LomaDebbora Presto [facility] None  05/24/2022  3:15 PM GudeNicholas Lose [facility] None    No orders of the defined types were placed in this encounter.   -------------------------------

## 2021-08-18 ENCOUNTER — Ambulatory Visit (INDEPENDENT_AMBULATORY_CARE_PROVIDER_SITE_OTHER): Payer: BC Managed Care – PPO | Admitting: Psychiatry

## 2021-08-18 DIAGNOSIS — F411 Generalized anxiety disorder: Secondary | ICD-10-CM

## 2021-08-18 NOTE — Progress Notes (Signed)
[facility] Counselor/Therapist Progress Note  Patient ID: Loretta Nelson, MRN: [id number],    Date: 08/18/2021  Time Spent: 57 minutes   Treatment Type: Individual Therapy  Reported Symptoms: anxiety, depression  Mental Status Exam:  Appearance:   Neat     Behavior:  Appropriate, Sharing, and Motivated  Motor:  Normal  Speech/Language:   Clear and Coherent  Affect:  Depressed and anxious  Mood:  anxious and depressed  Thought process:  goal directed  Thought content:    WNL  Sensory/Perceptual disturbances:    WNL  Orientation:  oriented to person, place, time/date, situation, day of week, month of year, year, and stated date of August 18, 2021  Attention:  Fair  Concentration:  Good and Fair  Memory:  Some occasional short term memory issues "where I forget a word"  Fund of knowledge:   Good  Insight:    Good and Fair  Judgment:   Good  Impulse Control:  Good   Risk Assessment: Danger to Self:  No Self-injurious Behavior: No Danger to Others: No Duty to Warn:no Physical Aggression / Violence:No  Access to Firearms a concern: No  Gang Involvement:No   Subjective: Patient today reporting anxiety, depression with some improvement in both, and learning to better manage them. Still some ups and downs. Medication helping her sleep. 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 Rates herself as a "6" on 1-10 anxiety scale.  (With 10 being the most anxious).  Interventions: Solution-Oriented/Positive Psychology, Ego-Supportive, and Insight-Oriented  Treatment goal plan: Patient not signing treatment plan due to Covid. Treatment goals: Treatment goals remain on treatment plan as patient works with strategies to achieve her goals. Progress is assessed each session and documented in the "subject" and/or "plan" section of treatment note. Long-term goal: Reduce overall level, frequency, and intensity of the anxiety so that daily functioning is not impaired.  Short-term goal: Verbalize an understanding of the role that fearful thinking plays in creating fears, excessive worry, and persistent anxiety symptoms. Strategies: Strengthen her new nonavoidang approach and build confidence.    Diagnosis:   ICD-10-CM   1. Generalized anxiety disorder  F41.1       Plan:  Patient today showing good participation and motivation working today on her anxiety and depression and noting  some progress made. Sleep better, Energy better, "feeling better physically and others noticing it also."  [place] participation in session.  Making some progress in managing emotions and triggers, not as self-blaming, not as depressed, some increased insight. Continues to need treatment to work further on her goals, develop more coping skills, and reach a point of feeling less anxious and depressed to the point of feeling better about herself and being able to make good decisions about work and other areas of life, feeling more hope for her future.  Encouraged patient to continue goal-directed behaviors especially in  her progress of being more self accepting, and better managing anxiety and depression, not over personalizing situations, having appropriate boundaries and understanding that she does not have to explain everything to everybody  even if they ask questions, and continued work on decreasing perfectionistic tendencies. Encouraged patient in her practice of more positive behaviors including: Staying in the present and focusing on what she can control or change, remaining in contact with people who are supportive, getting outside daily and walking, stop assuming worst case scenarios, look for more positives versus negatives daily, use positive self talk, healthy nutrition and exercise, daily affirmations, interrupt self negating, reduce overthinking and over analyzing, challenge and counteract her self doubt, be involved in more things that she enjoys including creative projects in her shop at home, saying no without feeling guilty, allowing her faith to be an emotional support as well as spiritual, and recognize the strength she shows working with goal directed behaviors to move in a direction that supports her improved emotional health and overall outlook.  Review and progress/challenges noted with patient.  Next appointment within 2 to 3 weeks.  This record has been created using [employer].  Chart creation errors have been sought, but may not always have been located and corrected.  Such creation errors do not reflect on the standard of medical care provided.   Shanon Ace, LCSW

## 2021-08-25 ENCOUNTER — Telehealth: Payer: Self-pay | Admitting: Adult Health

## 2021-08-25 NOTE — Telephone Encounter (Signed)
Pt brought in paperwork to complete for STD. Due 7/1-7/10. Call pt when ready to pick up. Has apt 7/6 if ready, will pick up then. Placed in Traci's box

## 2021-08-26 NOTE — Telephone Encounter (Signed)
Noted I have form and will get completed

## 2021-08-27 ENCOUNTER — Other Ambulatory Visit: Payer: Self-pay | Admitting: Adult Health

## 2021-08-27 DIAGNOSIS — F331 Major depressive disorder, recurrent, moderate: Secondary | ICD-10-CM

## 2021-09-01 ENCOUNTER — Ambulatory Visit (INDEPENDENT_AMBULATORY_CARE_PROVIDER_SITE_OTHER): Payer: BC Managed Care – PPO | Admitting: Psychiatry

## 2021-09-01 DIAGNOSIS — F411 Generalized anxiety disorder: Secondary | ICD-10-CM | POA: Diagnosis not present

## 2021-09-01 NOTE — Progress Notes (Signed)
[facility] Counselor/Therapist Progress Note  Patient ID: Loretta Nelson, MRN: [id number],    Date: 09/01/2021  Time Spent:  55 minutes  Treatment Type: Individual Therapy  Reported Symptoms: anxiety, depression  Mental Status Exam:  Appearance:   Casual     Behavior:  Appropriate, Sharing, and Motivated  Motor:  Normal  Speech/Language:   Clear and Coherent  Affect:  Depressed and anxious  Mood:  anxious and depressed  Thought process:  goal directed  Thought content:    Obsessions  Sensory/Perceptual disturbances:    WNL  Orientation:  oriented to person, place, time/date, situation, day of week, month of year, year, and stated date of September 01, 2021  Attention:  Good  Concentration:  Good  Memory:  WNL  Fund of knowledge:   Good  Insight:    Good  Judgment:   Good  Impulse Control:  Good   Risk Assessment: Danger to Self:  No Self-injurious Behavior: No Danger to Others: No Duty to Warn:no Physical Aggression / Violence:No  Access to Firearms a concern: No  Gang Involvement:No   Subjective:  Patient today in reporting anxiety and depression but not quite as strong.  Bothered by July 4th fireworks last night "but got through it." Discussed some the thoughts of other people and how their negative thoughts impact patient, especially in terms of self-esteem, the way she looks, and other personal traits. Working with strategies discussed in sessions especially in trying to better manage depression and anxiety , and is seeing some progress/benefits. Is on short term disability with school system which allows her to be out a year if needed.  Working with her own business recently started online and is beginning to make some progress. Working to have better boundaries with others, to feel more self-assured, and to recognize triggers to her symptoms of anxiety/depression/stress. Is continuing to be negatively impacted by some people who she realizes she needs to set limits  with. Really hard letting go of negative impact from others. Working also with her perfectionistic tendencies. Try to accept that setting healthy boundaries "even with friends" is healthy. Working to decrease her worrying and further reduce her anxiety. Rates self as a  5 on 1-10 for anxiety and depression. More aware of triggers for both.  Interventions: Cognitive Behavioral Therapy and Ego-Supportive  Treatment goals: Treatment goals remain on treatment plan as patient works with strategies to achieve her goals. Progress is assessed each session and documented in the "subject" and/or "plan" section of treatment note. Long-term goal: Reduce overall level, frequency, and intensity of the anxiety so that daily functioning is not impaired.  Short-term goal: Verbalize an understanding of the role that fearful thinking plays in creating fears, excessive worry, and persistent anxiety symptoms. Strategies: Strengthen her new nonavoidang approach and build confidence.    Diagnosis:   ICD-10-CM   1. Generalized anxiety disorder  F41.1      Plan: Patient today showing good motivation and participation, and good follow through with strategies discussed in sessions. Energy and sleep better. Less beating up on herself, less self blaming, not as depressed, leading to some increased motivation and insight. Working well with coping skills and showing more determination. Seems and reports feeling more hope for herself into the future. Encouraged patient in her continuing of goal directed behaviors including: Being more self accepting, better managing anxiety and depression, not over personalizing situations, have appropriate boundaries and understanding that she does not have to explain to every  body even when they ask questions, continue to work on decreasing perfectionistic tendencies, stay in the present focusing what she can control or change, staying in contact with people who are supportive, get outside  daily and walk, refrain from assuming worst case scenarios, look for more positives versus negatives daily, positive self talk, healthy nutrition and exercise, daily affirmations, interrupt self negating, reduce overthinking over analyzing, challenge and counteract her self doubt, be involved in more things that she enjoys including creative projects in her shop at home, saying no without feeling guilty, allowing her faith to be an emotional support as well as spiritual, and realize the strength she shows working with goal directed behaviors to move in a direction that supports her improved emotional health.  Goal review and progress/challenges noted with patient.  Next appointment within 2 to 3 weeks.  This record has been created using [employer].  Chart creation errors have been sought, but may not always have been located and corrected.  Such creation errors do not reflect on the standard of medical care provided.  Shanon Ace, LCSW

## 2021-09-01 NOTE — Telephone Encounter (Signed)
Paper work completed.

## 2021-09-02 ENCOUNTER — Encounter: Payer: Self-pay | Admitting: Adult Health

## 2021-09-02 ENCOUNTER — Ambulatory Visit: Payer: BC Managed Care – PPO | Admitting: Adult Health

## 2021-09-02 DIAGNOSIS — F411 Generalized anxiety disorder: Secondary | ICD-10-CM

## 2021-09-02 DIAGNOSIS — G4700 Insomnia, unspecified: Secondary | ICD-10-CM | POA: Diagnosis not present

## 2021-09-02 DIAGNOSIS — F410 Panic disorder [episodic paroxysmal anxiety] without agoraphobia: Secondary | ICD-10-CM

## 2021-09-02 DIAGNOSIS — F331 Major depressive disorder, recurrent, moderate: Secondary | ICD-10-CM

## 2021-09-02 DIAGNOSIS — F422 Mixed obsessional thoughts and acts: Secondary | ICD-10-CM

## 2021-09-02 NOTE — Progress Notes (Signed)
Loretta Nelson [id number] [birth year] 56 y.o.  Subjective:   Patient ID:  Loretta Nelson is a 56 y.o. (DOB Dec 30, 1965) female.  Chief Complaint: No chief complaint on file.   HPI Loretta Nelson presents to the office today for follow-up of MDD, GAD, panic attacks, insomnia, mixed obsessional thoughts.  Describes mood today as "a little better". Pleasant. Tearful at times. Mood symptoms - reports depression, anxiety and irritability - "I still have all that". Reports worry and rumination - reliving past trauma. Reports intrusive thoughts and ritualistic behaviors. Denies recent panic attacks. Mood has improved - more consistent - able to pull herself up more - working on positivity. Feels like the increase in Wellbutrin has been helpful. Working with therapist. Remains on a leave of absence from work and does not feel like she is able to return at this time - works for [employer].    Mother living with her since 40 - serves as her care giver. Has 2 daughters at home - college and graduate school. Recovering from breast cancer - recently stopped an estrogen blocker. Seeing therapist Rinaldo Cloud. Stable interest and motivation. Taking medications as prescribed.   Energy levels improved. Active, does not have a regular exercise routine. Enjoys some usual interests and activities. Divorced. Has 2 daughters and mother living at home with her. Spending time with family. Appetite adequate - denies binge eating. Weight loss - 305 pounds today. Sleep has improved with Trazadone. Averages 6 hours - more quality sleep. Denies daytime napping. Focus and concentration difficulties, but getting better - mind still jumps around a lot. Stating "I'm still having a difficulties multi-tasking - jumping from task to task". Trying to make lists so she doesn't forget to do things. Difficulties completing tasks. Managing aspects of household. Works for [employer] - out of work currently. Denies SI  or HI.  Denies AH or VH. Denies self harm. Denies substance use.  Previous medication trials:  [facility] Visit from 01/15/2019 in [facility]  Total Score (max 30 points ) 30        Review of Systems:  Review of Systems  Musculoskeletal:  Negative for gait problem.  Neurological:  Negative for tremors.  Psychiatric/Behavioral:         Please refer to HPI    Medications: I have reviewed the patient's current medications.  Current Outpatient Medications  Medication Sig Dispense Refill   acetaminophen (TYLENOL) 500 MG tablet Take 2 tablets (1,000 mg total) by mouth every 8 (eight) hours as needed. 30 tablet 0   amLODipine (NORVASC) 5 MG tablet Take 5 mg by mouth daily.   5   buPROPion (WELLBUTRIN XL) 150 MG 24 hr tablet TAKE 1 TABLET BY MOUTH EVERY DAY 90 tablet 0   buPROPion (WELLBUTRIN XL) 300 MG 24 hr tablet TAKE 1 TABLET BY MOUTH EVERY DAY 90 tablet 0   Cholecalciferol 4000 UNITS CAPS Take 4,000 Units by mouth daily.      desvenlafaxine (PRISTIQ) 100 MG 24 hr tablet Take 1 tablet (100 mg total) by mouth daily. 30 tablet 2   diclofenac Sodium (VOLTAREN) 1 % GEL Apply 1 application topically 4 (four) times daily as needed (knee pain.).     EDARBYCLOR 40-25 MG TABS Take 1 tablet by mouth daily.  5   ibuprofen (ADVIL) 800 MG tablet Take 1 tablet (800 mg total) by mouth every 8 (eight) hours as needed. 30 tablet 0  L-Methylfolate-B12-B6-B2 (CEREFOLIN) 07-29-48-5 MG TABS Take 1 tablet by mouth every morning. 90 tablet 3   levothyroxine (SYNTHROID) 175 MCG tablet Take 175 mcg by mouth daily before breakfast.   5   loratadine (CLARITIN) 10 MG tablet TAKE 1 TABLET BY MOUTH EVERY DAY 90 tablet 1   metoprolol (TOPROL-XL) 200 MG 24 hr tablet Take 200 mg by mouth daily.   5   naproxen (NAPROSYN) 500 MG tablet Take 1 tablet by mouth every 12 (twelve) hours as needed.     pantoprazole (PROTONIX) 40 MG tablet Take 40 mg by mouth 2 (two)  times daily.      potassium chloride (KLOR-CON) 20 MEQ packet Take 40 mEq by mouth daily.      traZODone (DESYREL) 50 MG tablet Take 0.5-1 tablets (25-50 mg total) by mouth at bedtime as needed. 30 tablet 2   No current facility-administered medications for this visit.    Medication Side Effects: None  Allergies:  Allergies  Allergen Reactions   Tape Itching and Other (See Comments)    Adhesive tape--redness/inflammation/itching    Past Medical History:  Diagnosis Date   Allergic rhinitis    BARD1 gene mutation positive 5/68/6168   Complication of anesthesia    woke up during EGD   Dysphagia    Empty sella syndrome    Essential hypertension    Family history of breast cancer    Fibroids, intramural 02/17/2017   Gastroesophageal reflux disease    Generalized anxiety disorder    Graves disease 01/13/11   radioactive iodine treatment, 37.2 millicuries   Hiatal hernia    small   History of chemotherapy    History of hysterectomy 02/17/2017   History of migraine headaches    History of radiation therapy    Iron deficiency anemia    Malignant neoplasm of upper-outer quadrant of left breast in female, 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   Please see review of systems for further details on the patient's review from today.   Objective:   Physical Exam:  LMP 02/13/2017 (Exact Date)   Physical Exam Constitutional:      General: She is not in acute distress. Musculoskeletal:        General: No deformity.  Neurological:     Mental Status: She is alert  and oriented to person, place, and time.     Coordination: Coordination normal.  Psychiatric:        Attention and Perception: Attention and perception normal. She does not perceive auditory or visual hallucinations.        Mood and Affect: Mood normal. Mood is not anxious or depressed. Affect is not labile, blunt, angry or inappropriate.        Speech: Speech normal.        Behavior: Behavior normal.        Thought Content: Thought content normal. Thought content is not paranoid or delusional. Thought content does not include homicidal or suicidal ideation. Thought content does not include homicidal or suicidal plan.  Cognition and Memory: Cognition and memory normal.        Judgment: Judgment normal.     Comments: Insight intact     Lab Review:     Component Value Date/Time   NA 142 05/17/2021 1551   K 3.7 05/17/2021 1551   CL 103 05/17/2021 1551   CO2 33 (H) 05/17/2021 1551   GLUCOSE 97 05/17/2021 1551   BUN 14 05/17/2021 1551   CREATININE 0.84 05/17/2021 1551   CALCIUM 9.6 05/17/2021 1551   PROT 7.2 05/17/2021 1551   ALBUMIN 4.0 05/17/2021 1551   AST 18 05/17/2021 1551   ALT 16 05/17/2021 1551   ALKPHOS 75 05/17/2021 1551   BILITOT 0.3 05/17/2021 1551   GFRNONAA >60 05/17/2021 1551   GFRAA >60 10/24/2019 1323   GFRAA >60 04/10/2019 1240       Component Value Date/Time   WBC 6.2 05/17/2021 1551   WBC 5.3 11/23/2020 1140   RBC 4.28 05/17/2021 1551   HGB 11.5 (L) 05/17/2021 1551   HCT 36.1 05/17/2021 1551   PLT 187 05/17/2021 1551   MCV 84.3 05/17/2021 1551   MCH 26.9 05/17/2021 1551   MCHC 31.9 05/17/2021 1551   RDW 13.2 05/17/2021 1551   LYMPHSABS 1.8 05/17/2021 1551   MONOABS 0.5 05/17/2021 1551   EOSABS 0.3 05/17/2021 1551   BASOSABS 0.0 05/17/2021 1551    No results found for: "POCLITH", "LITHIUM"   No results found for: "PHENYTOIN", "PHENOBARB", "VALPROATE", "CBMZ"   .res Assessment: Plan:    Plan:  PDMP reviewed  Wellbutrin XL 338m daily  - denies seizure history. Pristiq 1076mdaily Trazadone 5043mt hs  Consider Rexulti    Therapist - DebRinaldo Cloudime spent with patient was 25 minutes. Greater than 50% of face to face time with patient was spent on counseling and coordination of care.    Patient totally disabled and unable to work 09/02/2021 through 10/07/2021.  RTC 4 weeks  Patient advised to contact office with any questions, adverse effects, or acute worsening in signs and symptoms.  Diagnoses and all orders for this visit:  Generalized anxiety disorder  Insomnia, unspecified type  Major depressive disorder, recurrent episode, moderate (HCC)  Panic attacks  Mixed obsessional thoughts and acts     Please see After Visit Summary for patient specific instructions.  Future Appointments  Date Time Provider DepUpper Lake/19/2023 12:00 PM DowShanon AceCSW [facility] None  09/29/2021 12:00 PM DowShanon AceCSW [facility] None  10/13/2021 12:00 PM DowShanon AceCSW [facility] None  01/24/2022  3:00 PM LomDebbora PrestoP [facility] None  05/24/2022  3:15 PM GudNicholas LoseD [facility] None    No orders of the defined types were placed in this encounter.   -------------------------------

## 2021-09-09 ENCOUNTER — Other Ambulatory Visit: Payer: Self-pay | Admitting: Neurology

## 2021-09-09 ENCOUNTER — Encounter: Payer: Self-pay | Admitting: Family Medicine

## 2021-09-09 DIAGNOSIS — G4733 Obstructive sleep apnea (adult) (pediatric): Secondary | ICD-10-CM

## 2021-09-15 ENCOUNTER — Ambulatory Visit (INDEPENDENT_AMBULATORY_CARE_PROVIDER_SITE_OTHER): Payer: BC Managed Care – PPO | Admitting: Psychiatry

## 2021-09-15 DIAGNOSIS — F411 Generalized anxiety disorder: Secondary | ICD-10-CM | POA: Diagnosis not present

## 2021-09-15 NOTE — Progress Notes (Signed)
[facility] Counselor/Therapist Progress Note  Patient ID: Loretta Nelson, MRN: [id number],    Date: 09/15/2021  Time Spent: 50 minutes   Treatment Type: Individual Therapy  Reported Symptoms: anxiety (main symptom), depression  Mental Status Exam:  Appearance:   Neat     Behavior:  Appropriate, Sharing, and Motivated  Motor:  Normal  Speech/Language:   Clear and Coherent  Affect:  Depressed and anxious  Mood:  anxious and depressed  Thought process:  goal directed  Thought content:    WNL  Sensory/Perceptual disturbances:    WNL  Orientation:  oriented to person, place, time/date, situation, day of week, month of year, year, and stated date of September 15, 2021  Attention:  Good  Concentration:  Good  Memory:  WNL  Fund of knowledge:   Good  Insight:    Good  Judgment:   Good  Impulse Control:  Good   Risk Assessment: Danger to Self:  No Self-injurious Behavior: No Danger to Others: No Duty to Warn:no Physical Aggression / Violence:No  Access to Firearms a concern: No  Gang Involvement:No   Subjective:  Patient in today reporting anxiety as main symptom and also some depression. 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 Is anxious to see how well my busiiness picks up. Seems to be better in her mood when she is working on her business. Reports still having some depression and feeling "I'm not good enough". Did talk with mom about some of her issues and going to therapy and mom was supportive and "happy that I'm getting therapy." Actually found mom to be encouraging and hopeful for her. Rates self as a 5 on 1-10 scale for anxiety, and 5 for depression on 1-10 scale. Improved awareness of triggers and following through on goal-directed behaviors.  Interventions: Cognitive Behavioral Therapy   Treatment goals: Treatment goals remain on treatment plan as patient works with strategies to achieve her goals. Progress is assessed  each session and documented in the "subject" and/or "plan" section of treatment note. Long-term goal: Reduce overall level, frequency, and intensity of the anxiety so that daily functioning is not impaired.  Short-term goal: Verbalize an understanding of the role that fearful thinking plays in creating fears, excessive worry, and persistent anxiety symptoms. Strategies: Strengthen her new nonavoidang approach and build confidence.    Diagnosis:   ICD-10-CM   1. Generalized anxiety disorder  F41.1      Plan: Patient today showing active participation and good motivation as she continued working with her anxiety and also some depression.  More recently she states the anxiety has been her main symptom and is mostly related to personal and family interactions as well as the difficult and negative impact of her previous job situation.  Smiling more during much of session but also shared some 2 years during times of talking through some family history situations and not feeling as good about herself at that point in life.  Shares that she sometimes "has body image issues but is not as bad as it used to be".  Also shared that she talked more with mom since last appointment to let her know about her being in therapy and also some of what she is dealing with and mom actually responded in a healthy/supportive way.  Continues working with goal directed behaviors and remaining on her medication as prescribed.  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  Having more hope for her future as she continues to work through her current anxiety and depression that will help her in the moving  forward that she wants to happen.  Encouraged patient and her follow through on positive/goal directed behaviors including: Being more self accepting, better managing anxiety and depression, not over personalizing situations, have appropriate boundaries and understanding that she does not have to explain to every body even when they ask questions, continue to work on decreasing perfectionistic tendencies, stay in the present focusing on what she can control or change, staying in contact with people who are supportive, get outside daily and walk, refrain from assuming worst case scenarios, look for more positives versus negatives daily, healthy nutrition and exercise, daily affirmations, positive self talk, interrupt self negating, reduce overthinking and over analyzing, challenge and counteract her self doubt, be involved in more things that she enjoys including creative projects in her shop at home, saying no without feeling guilty, allowing her faith to be an emotional support as well as spiritual, and recognize the strength she shows working with goal directed behaviors to move in a direction that supports her improved emotional health and overall wellbeing.  Goal review and progress/challenges noted with patient.  Next appointment within 2 weeks.  This record has been created using [employer].  Chart creation errors have been sought, but may not always have been located and corrected.  Such creation errors do not reflect on the standard of medical care provided.   Shanon Ace, LCSW

## 2021-09-16 ENCOUNTER — Other Ambulatory Visit: Payer: Self-pay | Admitting: Adult Health

## 2021-09-16 DIAGNOSIS — F331 Major depressive disorder, recurrent, moderate: Secondary | ICD-10-CM

## 2021-09-29 ENCOUNTER — Ambulatory Visit: Payer: BC Managed Care – PPO | Admitting: Psychiatry

## 2021-09-29 ENCOUNTER — Ambulatory Visit (INDEPENDENT_AMBULATORY_CARE_PROVIDER_SITE_OTHER): Payer: BC Managed Care – PPO | Admitting: Adult Health

## 2021-09-29 ENCOUNTER — Encounter: Payer: Self-pay | Admitting: Adult Health

## 2021-09-29 DIAGNOSIS — F411 Generalized anxiety disorder: Secondary | ICD-10-CM

## 2021-09-29 DIAGNOSIS — F331 Major depressive disorder, recurrent, moderate: Secondary | ICD-10-CM | POA: Diagnosis not present

## 2021-09-29 MED ORDER — BUPROPION HCL ER (XL) 150 MG PO TB24
ORAL_TABLET | ORAL | 5 refills | Status: DC
Start: 2021-09-29 — End: 2021-12-30

## 2021-09-29 MED ORDER — DESVENLAFAXINE SUCCINATE ER 100 MG PO TB24: 100.0000 mg | ORAL_TABLET | Freq: Every day | ORAL | 0 refills | Status: DC

## 2021-09-29 NOTE — Progress Notes (Signed)
Loretta Nelson [id number] 1965-05-01 56 y.o.  Subjective:   Patient ID:  Loretta Nelson is a 56 y.o. (DOB [birth year]) female.  Chief Complaint: No chief complaint on file.   HPI Loretta Nelson presents to the office today for follow-up of MDD, GAD, panic attacks, insomnia, mixed obsessional thoughts.  Describes mood today as "not too good". Pleasant. Tearful throughout interview. Mood symptoms - reports depression, anxiety and irritability. Reports worry and rumination. Reports intrusive thoughts and ritualistic behaviors. Denies recent panic attacks. Mood has regressed. Does not feel like she can return to work setting. Stating "I'm so unsure of myself". Feels like she has worked hard to get to where she's at and is disappointed in herself. Stating "it's just too much pressure for me". Feels like the breast cancer diagnosis and treatment have changed her. Stating "it was very traumatic". Has not felt the same since the treatment. Concerned about leaving her job, but doesn't feel like she can return at this time. Feels like the medications are helpful. Working with therapist. Remains on a leave of absence from work and does not feel like she is able to return at this time - works for [employer].  Seeing therapist Rinaldo Cloud. Stable interest and motivation. Taking medications as prescribed.  Energy levels lower. Active, does not have a regular exercise routine. Enjoys some usual interests and activities. Divorced. Mother living with her since 1 - serves as her care giver. Has 2 daughters at home - college and graduate school. Recovering from breast cancer. Appetite adequate - denies binge eating. Weight loss - 305 pounds today. Sleep has improved with Trazadone. Averages 6 hours - more quality sleep. Denies daytime napping. Focus and concentration difficulties, but getting better - mind still jumps around a lot. Stating "I'm still having a difficulties multi-tasking -  jumping from task to task". Trying to make lists so she doesn't forget to do things. Difficulties completing tasks. Managing aspects of household. Works for [employer] - out of work currently. Denies SI or HI.  Denies AH or VH. Denies self harm. Denies substance use.  Previous medication trials:  [place] [facility] Visit from 01/15/2019 in [facility]  Total Score (max 30 points ) 30       Review of Systems:  Review of Systems  Musculoskeletal:  Negative for gait problem.  Neurological:  Negative for tremors.  Psychiatric/Behavioral:         Please refer to HPI    Medications: I have reviewed the patient's current medications.  Current Outpatient Medications  Medication Sig Dispense Refill   buPROPion (WELLBUTRIN XL) 150 MG 24 hr tablet Take three tablets every morning. 90 tablet 5   acetaminophen (TYLENOL) 500 MG tablet Take 2 tablets (1,000 mg total) by mouth every 8 (eight) hours as needed. 30 tablet 0   amLODipine (NORVASC) 5 MG tablet Take 5 mg by mouth daily.   5   Cholecalciferol 4000 UNITS CAPS Take 4,000 Units by mouth daily.      desvenlafaxine (PRISTIQ) 100 MG 24 hr tablet Take 1 tablet (100 mg total) by mouth daily. 90 tablet 0   diclofenac Sodium (VOLTAREN) 1 % GEL Apply 1 application topically 4 (four) times daily as needed (knee pain.).     EDARBYCLOR 40-25 MG TABS Take 1 tablet by mouth daily.  5   ibuprofen (ADVIL) 800 MG tablet Take 1 tablet (800 mg total) by mouth every 8 (eight) hours  as needed. 30 tablet 0   L-Methylfolate-B12-B6-B2 (CEREFOLIN) 07-29-48-5 MG TABS Take 1 tablet by mouth every morning. 90 tablet 3   levothyroxine (SYNTHROID) 175 MCG tablet Take 175 mcg by mouth daily before breakfast.   5   loratadine (CLARITIN) 10 MG tablet TAKE 1 TABLET BY MOUTH EVERY DAY 90 tablet 1   metoprolol (TOPROL-XL) 200 MG 24 hr tablet Take 200 mg by mouth daily.   5   naproxen (NAPROSYN) 500 MG tablet Take 1 tablet by mouth every  12 (twelve) hours as needed.     pantoprazole (PROTONIX) 40 MG tablet Take 40 mg by mouth 2 (two) times daily.      potassium chloride (KLOR-CON) 20 MEQ packet Take 40 mEq by mouth daily.      traZODone (DESYREL) 50 MG tablet Take 0.5-1 tablets (25-50 mg total) by mouth at bedtime as needed. 30 tablet 2   No current facility-administered medications for this visit.    Medication Side Effects: None  Allergies:  Allergies  Allergen Reactions   Tape Itching and Other (See Comments)    Adhesive tape--redness/inflammation/itching    Past Medical History:  Diagnosis Date   Allergic rhinitis    BARD1 gene mutation positive 9/52/8413   Complication of anesthesia    woke up during EGD   Dysphagia    Empty sella syndrome    Essential hypertension    Family history of breast cancer    Fibroids, intramural 02/17/2017   Gastroesophageal reflux disease    Generalized anxiety disorder    Graves disease 01/13/11   radioactive iodine treatment, 24.4 millicuries   Hiatal hernia    small   History of chemotherapy    History of hysterectomy 02/17/2017   History of migraine headaches    History of radiation therapy    Iron deficiency anemia    Malignant neoplasm of upper-outer quadrant of left breast in female, 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   Please see review of systems for further details on the patient's review from today.   Objective:   Physical Exam:  LMP 02/13/2017 (Exact Date)   Physical Exam Constitutional:      General: She is not in acute  distress. Musculoskeletal:        General: No deformity.  Neurological:     Mental Status: She is alert and oriented to person, place, and time.     Coordination: Coordination normal.  Psychiatric:        Attention and Perception: Attention and perception normal. She does not perceive auditory or visual hallucinations.        Mood and Affect: Mood normal. Mood is not anxious or depressed. Affect is not labile, blunt, angry or inappropriate.        Speech: Speech normal.        Behavior: Behavior normal.        Thought Content: Thought content normal. Thought content is not paranoid or delusional. Thought content does not include homicidal or suicidal ideation. Thought content does not include homicidal or suicidal plan.  Cognition and Memory: Cognition and memory normal.        Judgment: Judgment normal.     Comments: Insight intact     Lab Review:     Component Value Date/Time   NA 142 05/17/2021 1551   K 3.7 05/17/2021 1551   CL 103 05/17/2021 1551   CO2 33 (H) 05/17/2021 1551   GLUCOSE 97 05/17/2021 1551   BUN 14 05/17/2021 1551   CREATININE 0.84 05/17/2021 1551   CALCIUM 9.6 05/17/2021 1551   PROT 7.2 05/17/2021 1551   ALBUMIN 4.0 05/17/2021 1551   AST 18 05/17/2021 1551   ALT 16 05/17/2021 1551   ALKPHOS 75 05/17/2021 1551   BILITOT 0.3 05/17/2021 1551   GFRNONAA >60 05/17/2021 1551   GFRAA >60 10/24/2019 1323   GFRAA >60 04/10/2019 1240       Component Value Date/Time   WBC 6.2 05/17/2021 1551   WBC 5.3 11/23/2020 1140   RBC 4.28 05/17/2021 1551   HGB 11.5 (L) 05/17/2021 1551   HCT 36.1 05/17/2021 1551   PLT 187 05/17/2021 1551   MCV 84.3 05/17/2021 1551   MCH 26.9 05/17/2021 1551   MCHC 31.9 05/17/2021 1551   RDW 13.2 05/17/2021 1551   LYMPHSABS 1.8 05/17/2021 1551   MONOABS 0.5 05/17/2021 1551   EOSABS 0.3 05/17/2021 1551   BASOSABS 0.0 05/17/2021 1551    No results found for: "POCLITH", "LITHIUM"   No results found for: "PHENYTOIN",  "PHENOBARB", "VALPROATE", "CBMZ"   .res Assessment: Plan:    Plan:  PDMP reviewed  Wellbutrin XL 361m daily - denies seizure history. Pristiq 1036mdaily Trazadone 5041mt hs  Consider Rexulti    Therapist - DebRinaldo Cloudime spent with patient was 25 minutes. Greater than 50% of face to face time with patient was spent on counseling and coordination of care.    Patient totally disabled and unable to work 09/29/2021 through 12/30/2021.  RTC 4 weeks  Patient advised to contact office with any questions, adverse effects, or acute worsening in signs and symptoms.  Diagnoses and all orders for this visit:  Major depressive disorder, recurrent episode, moderate (HCC) -     buPROPion (WELLBUTRIN XL) 150 MG 24 hr tablet; Take three tablets every morning. -     desvenlafaxine (PRISTIQ) 100 MG 24 hr tablet; Take 1 tablet (100 mg total) by mouth daily.  Generalized anxiety disorder     Please see After Visit Summary for patient specific instructions.  Future Appointments  Date Time Provider DepPelham/04/2021 10:00 AM DowShanon AceCSW [facility] None  10/13/2021 12:00 PM DowShanon AceCSW [facility] None  11/04/2021 10:50 AM [facility] MR 1 GI-315MRI [facility] [address] [facility]  01/24/2022  3:00 PM Lomax, Amy, NP [facility] None  05/24/2022  3:15 PM GudNicholas LoseD [facility] None    No orders of the defined types were placed in this encounter.   -------------------------------

## 2021-09-30 ENCOUNTER — Ambulatory Visit: Payer: BC Managed Care – PPO | Admitting: Psychiatry

## 2021-09-30 DIAGNOSIS — F411 Generalized anxiety disorder: Secondary | ICD-10-CM

## 2021-09-30 NOTE — Progress Notes (Signed)
[facility] Counselor/Therapist Progress Note  Patient ID: Loretta Nelson, MRN: [id number],    Date: 09/30/2021  Time Spent: 55 minutes   Treatment Type: Individual Therapy  Reported Symptoms: anxiety, "some depression", some tearfulness  Mental Status Exam:  Appearance:   Well Groomed     Behavior:  Appropriate, Sharing, and Motivated  Motor:  Normal  Speech/Language:   Clear and Coherent  Affect:  Mostly anxious, some depression  Mood:  anxious , "some depression"  Thought process:  goal directed  Thought content:    WNL  Sensory/Perceptual disturbances:    WNL  Orientation:  oriented to person, place, time/date, situation, day of week, month of year, year, and stated date of September 30, 2021  Attention:  Good  Concentration:  Good  Memory:  Anytime I'm going anywhere I have to think about where I'm going versus always remembering the route  Fund of knowledge:   Good  Insight:    Good  Judgment:   Good  Impulse Control:  Good   Risk Assessment: Danger to Self:  No Self-injurious Behavior: No Danger to Others: No Duty to Warn:no Physical Aggression / Violence:No  Access to Firearms a concern: No  Gang Involvement:No   Subjective: Patient in today reporting anxiety "is about the same". States she's had more challenging situations this past week that aggravate her anxiety and explained these in detail today with some tearfulness.  Not returning to her job and went past week to pack up her belongings and move out of her office which was a big step for her.  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 Positive self-talk. More flexibility feels good to her versus prior working schedule and expectations. "Feeling better that I'm not as stressed".  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 Focusing some on better self-care, better self-talk and pacing herself. As far as her prior statement "I'm not good enough", she is starting to  feel some decrease in that feeling. Not stress eating as much. Depression some better but anxiety about the same. "I am feeling better and feel like I have some control over some things."Mom and daughters still supportive." Today rates self as a "4" on 1-10 anxiety scale and a "4" on depression 1-10 scale. Encouraged her continued awareness of triggers that can vary from day to day, and use her goal-directed behaviors to move forward.  Interventions: Cognitive Behavioral Therapy and Ego-Supportive   Treatment goals: Treatment goals remain on treatment plan as patient works with strategies to achieve her goals. Progress is assessed each session and documented in the "subject" and/or "plan" section of treatment note. Long-term goal: Reduce overall level, frequency, and intensity of the anxiety so that daily functioning is not impaired.  Short-term goal: Verbalize an understanding of the role that fearful thinking plays in creating fears, excessive worry, and persistent anxiety symptoms. Strategies: Strengthen her new nonavoidang approach and build confidence.   Diagnosis:   ICD-10-CM   1. Generalized anxiety disorder  F41.1      Plan: Patient today actively participating in session working on her anxiety and depression, and noticing some improvement in mood and outlook. 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 Processed her not returning to her former job and getting to be more at peace with that. Does feel more energy and some increased hopefulness as she stated at session. Sees her "improvement and also having  more positive feelings about herself gradually." Focusing also on better sleep and more time with other people.Smiling more. Encouraged patient and her following through on positive behaviors including: Not over personalizing situations, better management of anxiety and depression through use of strategies discussed in sessions, being more self  accepting, having appropriate boundaries and understanding that she does not have to explain to every body when they ask questions, continue to work on decreasing perfectionistic tendencies, staying in the present focusing on what she can control or change, staying in contact with people who are supportive, get outside daily and walk, refrain from assuming worst case scenarios, look for more positives versus negatives daily, healthy nutrition and exercise, daily affirmations, positive self talk, interrupt self negating, reduce overthinking and over analyzing, challenge and counteract her self doubt, be involved in more things that she enjoys including 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   Goal review and progress/challenges noted with patient.  Next appointment within 2 to 3 weeks.  This record has been created using [employer].  Chart creation errors have been sought, but may not always have been located and corrected.  Such creation errors do not reflect on the standard of medical care provided.   Shanon Ace, LCSW

## 2021-10-05 ENCOUNTER — Telehealth: Payer: Self-pay | Admitting: Adult Health

## 2021-10-05 NOTE — Telephone Encounter (Signed)
Pt contacted to pick up STD completed forms as requested. Pt to p/u 8/8.

## 2021-10-13 ENCOUNTER — Ambulatory Visit: Payer: BC Managed Care – PPO | Admitting: Psychiatry

## 2021-10-19 ENCOUNTER — Other Ambulatory Visit: Payer: Self-pay | Admitting: Orthopedic Surgery

## 2021-10-19 DIAGNOSIS — M47816 Spondylosis without myelopathy or radiculopathy, lumbar region: Secondary | ICD-10-CM

## 2021-10-19 DIAGNOSIS — M5410 Radiculopathy, site unspecified: Secondary | ICD-10-CM

## 2021-10-22 ENCOUNTER — Other Ambulatory Visit: Payer: Self-pay | Admitting: Adult Health

## 2021-10-22 DIAGNOSIS — F331 Major depressive disorder, recurrent, moderate: Secondary | ICD-10-CM

## 2021-10-27 ENCOUNTER — Ambulatory Visit (INDEPENDENT_AMBULATORY_CARE_PROVIDER_SITE_OTHER): Payer: BC Managed Care – PPO | Admitting: Adult Health

## 2021-10-27 ENCOUNTER — Telehealth: Payer: Self-pay | Admitting: Adult Health

## 2021-10-27 ENCOUNTER — Encounter: Payer: Self-pay | Admitting: Adult Health

## 2021-10-27 ENCOUNTER — Ambulatory Visit (INDEPENDENT_AMBULATORY_CARE_PROVIDER_SITE_OTHER): Payer: BC Managed Care – PPO | Admitting: Psychiatry

## 2021-10-27 DIAGNOSIS — G4700 Insomnia, unspecified: Secondary | ICD-10-CM | POA: Diagnosis not present

## 2021-10-27 DIAGNOSIS — F422 Mixed obsessional thoughts and acts: Secondary | ICD-10-CM

## 2021-10-27 DIAGNOSIS — F411 Generalized anxiety disorder: Secondary | ICD-10-CM

## 2021-10-27 DIAGNOSIS — F331 Major depressive disorder, recurrent, moderate: Secondary | ICD-10-CM

## 2021-10-27 DIAGNOSIS — F410 Panic disorder [episodic paroxysmal anxiety] without agoraphobia: Secondary | ICD-10-CM

## 2021-10-27 NOTE — Telephone Encounter (Signed)
Received STD forms. Placed in Traci's box 8/30

## 2021-10-27 NOTE — Progress Notes (Signed)
[facility] Counselor/Therapist Progress Note  Patient ID: Loretta Nelson, MRN: [id number],    Date: 10/27/2021  Time Spent: 55 minutes   Treatment Type: Individual Therapy  Reported Symptoms: anxiety, "only depression I have is when something doesn't go right"  Mental Status Exam:  Appearance:   Casual and Neat     Behavior:  Appropriate, Sharing, and Motivated  Motor:  Normal  Speech/Language:   Clear and Coherent  Affect:  anxiety  Mood:  anxious  Thought process:  goal directed  Thought content:    Overthinking but improving  Sensory/Perceptual disturbances:    WNL  Orientation:  oriented to person, place, time/date, situation, day of week, month of year, year, and stated date of October 27, 2021  Attention:  Good  Concentration:  Good  Memory:  Some slight short term memory issues at times especially if my anxiety is "[employer] of knowledge:   Good  Insight:    Good  Judgment:   Good  Impulse Control:  Good   Risk Assessment: Danger to Self:  No Self-injurious Behavior: No Danger to Others: No Duty to Warn:no Physical Aggression / Violence:No  Access to Firearms a concern: No  Gang Involvement:No   Subjective: Patient in today reporting anxiety improving some and better able to use strategies as discussed to manage the anxiety. Some anxiety especially in heavier traffic. 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 Continues work on her anxiety and in improving her communication especially with her elderly mother and setting necessary boundaries and limits without feeling guilty. Practiced this is session today, helping patient be able to use this at home. Acknowledges that a lot of her anxiety is related to her mother. To also seek community programs that may offer some socialization that could help. Feeling better  with how she is managing stressors at home and beyond. More positive self-talk. Having more flexible time during the day is helpful to patient. Smiling more.  Better self-care, and feeling that she has more control over things versus things controlling her. Continues her work with goal-directed behaviors, and also in better managing some issues in relationship with her mother.  Interventions: Cognitive Behavioral Therapy and Ego-Supportive   Treatment goals: Treatment goals remain on treatment plan as patient works with strategies to achieve her goals. Progress is assessed each session and documented in the "subject" and/or "plan" section of treatment note. Long-term goal: Reduce overall level, frequency, and intensity of the anxiety so that daily functioning is not impaired.  Short-term goal: Verbalize an understanding of the role that fearful thinking plays in creating fears, excessive worry, and persistent anxiety symptoms. Strategies: Strengthen her new nonavoidang approach and build confidence.    Diagnosis:   ICD-10-CM   1. Generalized anxiety disorder  F41.1      Plan: Patient today working well on her goals particularly relating to her anxiety as depression has significantly did increased.  Shares that the only time she feels depressed is when "things do not go right or as planned".  Discussed this more with patient encouraging ways of looking at things so that when things do not go right, there is not an automatic feeling of being depressed but rather may be disappointed that things do not go right and also understanding that sometimes things are going to not go  the way we had hoped, but we can learn better coping skills so that we do not end up feeling depressed.  Worked on this more in session today and patient seemed to gain some understanding that can be helpful to her.  Used specific examples as we worked on this.  Is noticing improvement in her mood and outlook so she is making  progress however still needs therapy to work further on her goals and help make some of the changes she is trying to make be a more lasting change for her.  Starting to feel some more positive feelings about herself as well which is helping. Encouraged patient in practicing more positive behaviors including: Refraining from over personalizing situations, better management of anxiety and depression through use of strategies discussed in sessions, practice more self-acceptance, use appropriate boundaries and understand that she does not have to explain to every body when they ask questions, continue working on decreasing perfectionistic tendencies, remain in the present focusing on what she can change, remaining contact with people who are supportive of her, get outside daily and walk, avoid assuming worst case scenarios, look for more positives versus negatives daily, healthy nutrition and exercise, use of daily affirmations, positive self talk, interrupt self negating, reduce overthinking and over analyzing, challenge and counteract her self doubt, get involved in more things that she enjoys including 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  Goal review and progress/challenges noted with patient.  Next appointment within 2 weeks.  This record has been created using [employer].  Chart creation errors have been sought, but may not always have been located and corrected.  Such creation errors do not reflect on the standard of medical care provided.   Shanon Ace, LCSW

## 2021-10-27 NOTE — Progress Notes (Signed)
Loretta Nelson [id number] 1965/11/26 56 y.o.  Subjective:   Patient ID:  Loretta Nelson is a 56 y.o. (DOB 1965/07/20) female.  Chief Complaint: No chief complaint on file.   HPI Loretta Nelson presents to the office today for follow-up of MDD, GAD, panic attacks, insomnia, mixed obsessional thoughts.  Describes mood today as "about the same". Pleasant. Tearful at times. Mood symptoms - reports depression, anxiety and irritability. Reports worry and rumination. Reports intrusive thoughts and ritualistic behaviors. Denies recent panic attacks. Mood fluctuates. Trying to identify the triggers that cause mood decline. Feels like the increase in Wellbutrin has been helpful. Not staying on the bed as much - feeling a little more motivated than she has been. Does not feel like she is ready to return to the work setting. Stating "I don't know that I am ready for that situation". Still lacking confidence and is "unsure" of herself. Concerns about communication with others - word finding and stumbling through conversations. Feels like the medications are helpful. Working with therapist. Remains on a leave of absence from work and does not feel like she is able to return at this time - works for [employer]. Seeing therapist Rinaldo Cloud. Stable interest and motivation. Taking medications as prescribed. Energy levels lower. Active, does not have a regular exercise routine. Enjoys some usual interests and activities. Divorced. Mother living with her since 15 - serves as her care giver. Has 2 daughters at home - college and graduate school. Recovering from breast cancer. Appetite adequate - denies binge eating. Weight loss - 305 pounds today. Sleep has improved. Averages 7 to 8 hours with Trazadone. Decreased daytime napping. Focus and concentration difficulties, maybe a little better. Difficulties multi-tasking. Making lists "in an effort not to forget things".Difficulties completing  tasks. Managing aspects of household. Works for [employer] - out of work currently. Denies SI or HI.  Denies AH or VH. Denies self harm. Denies substance use.  Previous medication trials:  [place] Office Visit from 01/15/2019 in [facility]  Total Score (max 30 points ) 30        Review of Systems:  Review of Systems  Musculoskeletal:  Negative for gait problem.  Neurological:  Negative for tremors.  Psychiatric/Behavioral:         Please refer to HPI    Medications: I have reviewed the patient's current medications.  Current Outpatient Medications  Medication Sig Dispense Refill   acetaminophen (TYLENOL) 500 MG tablet Take 2 tablets (1,000 mg total) by mouth every 8 (eight) hours as needed. 30 tablet 0   amLODipine (NORVASC) 5 MG tablet Take 5 mg by mouth daily.   5   buPROPion (WELLBUTRIN XL) 150 MG 24 hr tablet Take three tablets every morning. 90 tablet 5   Cholecalciferol 4000 UNITS CAPS Take 4,000 Units by mouth daily.      desvenlafaxine (PRISTIQ) 100 MG 24 hr tablet Take 1 tablet (100 mg total) by mouth daily. 90 tablet 0   diclofenac Sodium (VOLTAREN) 1 % GEL Apply 1 application topically 4 (four) times daily as needed (knee pain.).     EDARBYCLOR 40-25 MG TABS Take 1 tablet by mouth daily.  5   ibuprofen (ADVIL) 800 MG tablet Take 1 tablet (800 mg total) by mouth every 8 (eight) hours as needed. 30 tablet 0   L-Methylfolate-B12-B6-B2 (CEREFOLIN) 07-29-48-5 MG TABS Take 1 tablet by mouth every morning. 90 tablet 3  levothyroxine (SYNTHROID) 175 MCG tablet Take 175 mcg by mouth daily before breakfast.   5   loratadine (CLARITIN) 10 MG tablet TAKE 1 TABLET BY MOUTH EVERY DAY 90 tablet 1   metoprolol (TOPROL-XL) 200 MG 24 hr tablet Take 200 mg by mouth daily.   5   naproxen (NAPROSYN) 500 MG tablet Take 1 tablet by mouth every 12 (twelve) hours as needed.     pantoprazole (PROTONIX) 40 MG tablet Take 40 mg by mouth 2 (two) times  daily.      potassium chloride (KLOR-CON) 20 MEQ packet Take 40 mEq by mouth daily.      traZODone (DESYREL) 50 MG tablet TAKE 1/2 TO 1 TABLET BY MOUTH AT BEDTIME AS NEEDED 90 tablet 0   No current facility-administered medications for this visit.    Medication Side Effects: None  Allergies:  Allergies  Allergen Reactions   Other     Other reaction(s): rash/red   Tape Itching and Other (See Comments)    Adhesive tape--redness/inflammation/itching    Past Medical History:  Diagnosis Date   Allergic rhinitis    BARD1 gene mutation positive 1/85/9093   Complication of anesthesia    woke up during EGD   Dysphagia    Empty sella syndrome    Essential hypertension    Family history of breast cancer    Fibroids, intramural 02/17/2017   Gastroesophageal reflux disease    Generalized anxiety disorder    Graves disease 01/13/11   radioactive iodine treatment, 11.2 millicuries   Hiatal hernia    small   History of chemotherapy    History of hysterectomy 02/17/2017   History of migraine headaches    History of radiation therapy    Iron deficiency anemia    Malignant neoplasm of upper-outer quadrant of left breast in female, 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   Please see review of systems for further details on the patient's review from today.   Objective:   Physical Exam:  LMP 02/13/2017 (Exact Date)   Physical Exam Constitutional:      General: She is not in acute distress. Musculoskeletal:        General: No deformity.  Neurological:     Mental  Status: She is alert and oriented to person, place, and time.     Coordination: Coordination normal.  Psychiatric:        Attention and Perception: Attention and perception normal. She does not perceive auditory or visual hallucinations.        Mood and Affect: Mood normal. Mood is not anxious or depressed. Affect is not labile, blunt, angry or inappropriate.        Speech: Speech normal.        Behavior: Behavior normal.        Thought Content: Thought content normal. Thought content is not paranoid or delusional. Thought content does not include homicidal or suicidal ideation. Thought content does not include homicidal or suicidal plan.        Cognition and Memory: Cognition and memory normal.  Judgment: Judgment normal.     Comments: Insight intact     Lab Review:     Component Value Date/Time   NA 142 05/17/2021 1551   K 3.7 05/17/2021 1551   CL 103 05/17/2021 1551   CO2 33 (H) 05/17/2021 1551   GLUCOSE 97 05/17/2021 1551   BUN 14 05/17/2021 1551   CREATININE 0.84 05/17/2021 1551   CALCIUM 9.6 05/17/2021 1551   PROT 7.2 05/17/2021 1551   ALBUMIN 4.0 05/17/2021 1551   AST 18 05/17/2021 1551   ALT 16 05/17/2021 1551   ALKPHOS 75 05/17/2021 1551   BILITOT 0.3 05/17/2021 1551   GFRNONAA >60 05/17/2021 1551   GFRAA >60 10/24/2019 1323   GFRAA >60 04/10/2019 1240       Component Value Date/Time   WBC 6.2 05/17/2021 1551   WBC 5.3 11/23/2020 1140   RBC 4.28 05/17/2021 1551   HGB 11.5 (L) 05/17/2021 1551   HCT 36.1 05/17/2021 1551   PLT 187 05/17/2021 1551   MCV 84.3 05/17/2021 1551   MCH 26.9 05/17/2021 1551   MCHC 31.9 05/17/2021 1551   RDW 13.2 05/17/2021 1551   LYMPHSABS 1.8 05/17/2021 1551   MONOABS 0.5 05/17/2021 1551   EOSABS 0.3 05/17/2021 1551   BASOSABS 0.0 05/17/2021 1551    No results found for: "POCLITH", "LITHIUM"   No results found for: "PHENYTOIN", "PHENOBARB", "VALPROATE", "CBMZ"   .res Assessment: Plan:    Plan:  PDMP  reviewed  Wellbutrin XL 139m - 3 daily - denies seizure history. Pristiq 1062mdaily Trazadone 5059mt hs  Consider Rexulti    Therapist - DebRinaldo Cloudime spent with patient was 25 minutes. Greater than 50% of face to face time with patient was spent on counseling and coordination of care.    Patient totally disabled and unable to work 09/29/2021 through 12/30/2021.  RTC 4 weeks  Patient advised to contact office with any questions, adverse effects, or acute worsening in signs and symptoms.  Diagnoses and all orders for this visit:  Generalized anxiety disorder  Major depressive disorder, recurrent episode, moderate (HCC)  Insomnia, unspecified type  Mixed obsessional thoughts and acts  Panic attacks     Please see After Visit Summary for patient specific instructions.  Future Appointments  Date Time Provider DepElias-Fela Solis/08/2021  1:30 PM GI-315 MR 1 GI-315MRI [facility] [facility]  11/04/2021  2:10 PM GI-315 MR 1 GI-315MRI [facility] [facility]  11/11/2021 10:00 AM DowShanon AceCSW CP-CP None  11/24/2021 10:00 AM DowShanon AceCSW CP-CP None  11/24/2021 11:40 AM Amara Manalang, RegBerdie OgrenP CP-CP None  12/09/2021  1:00 PM DowShanon AceCSW CP-CP None  01/24/2022  3:00 PM LomUbaldo Glassingmy, NP [facility] None  05/24/2022  3:15 PM GudNicholas LoseD [facility] None    No orders of the defined types were placed in this encounter.   -------------------------------

## 2021-10-28 NOTE — Telephone Encounter (Signed)
Forms completed and will get Dr. Clovis Pu to sign

## 2021-10-29 NOTE — Telephone Encounter (Signed)
STD forms ready to pick up. LVM to Pt 9/1

## 2021-11-04 ENCOUNTER — Ambulatory Visit
Admission: RE | Admit: 2021-11-04 | Discharge: 2021-11-04 | Disposition: A | Discharge status: Home / Self Care | Payer: BC Managed Care – PPO | Source: Ambulatory Visit | Attending: Orthopedic Surgery | Admitting: Orthopedic Surgery

## 2021-11-04 ENCOUNTER — Ambulatory Visit
Admission: RE | Admit: 2021-11-04 | Discharge: 2021-11-04 | Disposition: A | Discharge status: Home / Self Care | Payer: BC Managed Care – PPO | Source: Ambulatory Visit | Attending: Hematology and Oncology | Admitting: Hematology and Oncology

## 2021-11-04 DIAGNOSIS — M47816 Spondylosis without myelopathy or radiculopathy, lumbar region: Secondary | ICD-10-CM

## 2021-11-04 DIAGNOSIS — Z170 Estrogen receptor positive status [ER+]: Secondary | ICD-10-CM

## 2021-11-04 DIAGNOSIS — M5410 Radiculopathy, site unspecified: Secondary | ICD-10-CM

## 2021-11-04 MED ORDER — GADOPICLENOL 0.5 MMOL/ML IV SOLN
10.0000 mL | Freq: Once | INTRAVENOUS | Status: AC | PRN
  Administered 2021-11-04: 10 mL via INTRAVENOUS

## 2021-11-05 ENCOUNTER — Telehealth: Payer: Self-pay

## 2021-11-05 NOTE — Telephone Encounter (Signed)
Called and spoke to pt, informed her of negative MRI results.  Pt verbalized understanding and thanks

## 2021-11-05 NOTE — Telephone Encounter (Signed)
-----   Message from Gardenia Phlegm, NP sent at 11/05/2021  1:34 PM EDT ----- MRI neg for malignancy, please notify patient ----- Message ----- From: Interface, Rad Results In Sent: 11/04/2021   5:59 PM EDT To: Nicholas Lose, MD

## 2021-11-11 ENCOUNTER — Ambulatory Visit (INDEPENDENT_AMBULATORY_CARE_PROVIDER_SITE_OTHER): Payer: BC Managed Care – PPO | Admitting: Psychiatry

## 2021-11-11 DIAGNOSIS — F411 Generalized anxiety disorder: Secondary | ICD-10-CM | POA: Diagnosis not present

## 2021-11-11 NOTE — Progress Notes (Signed)
[facility] Counselor/Therapist Progress Note  Patient ID: Loretta Nelson, MRN: [id number],    Date: 11/11/2021  Time Spent: 52 minutes   Treatment Type: Individual Therapy  Reported Symptoms: anxiety, "still some intrusive thoughts"  Mental Status Exam:  Appearance:   Casual     Behavior:  Appropriate, Sharing, and Motivated  Motor:  Normal  Speech/Language:   Clear and Coherent  Affect:  anxious  Mood:  anxious  Thought process:  goal directed  Thought content:    WNL  Sensory/Perceptual disturbances:    WNL  Orientation:  oriented to person, place, time/date, situation, day of week, month of year, year, and stated date of Sept. 14, 2023  Attention:  Good; sometimes Fair  Concentration:  Good and Fair  Memory:  Better, still some forgetting names  Fund of knowledge:   Good  Insight:    Good  Judgment:   Good  Impulse Control:  Good   Risk Assessment: Danger to Self:  No Self-injurious Behavior: No Danger to Others: No Duty to Warn:no Physical Aggression / Violence:No  Access to Firearms a concern: No  Gang Involvement:No   Subjective:  Patient in today reporting anxiety but is improving emotionally and physically. Saw her PCP recently and blood pressure improved noticeably. Affect more full and smiling more. 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 Discussed today her wanting to preserve the progress she has made and continues to make. Is making her own health and well being a priority and feels good about it. Working with strategies especially managing anxiety and stress and also to be able to handle things that can happen unexpectedly.  Setting limits as needed within the family.  Less self blaming and no longer worrying about everything.  Does have "some times when I worry but better at working  through it."  Doing well in her business and already making some decisions re: hiring etc. and feeling this is a good choice for her moving forward.  Less anxiety with mother as she is better able to set some limits without guilt.  Open to looking at other things within the community that would offer her socialization outside the family, and also finds that she is meeting with people regarding her new business.  Her self talk is definitely more positive.  Smiles more.  Reporting better self-care and feeling that she is not as controlled by other people or things.  To continue goal directed behaviors in between sessions as discussed today.  Interventions: Cognitive Behavioral Therapy and Ego-Supportive  Treatment goals: Treatment goals remain on treatment plan as patient works with strategies to achieve her goals. Progress is assessed each session and documented in the "subject" and/or "plan" section of treatment note. Long-term goal: Reduce overall level, frequency, and intensity of the anxiety so that daily functioning is not impaired.  Short-term goal: Verbalize an understanding of the role that fearful thinking plays in creating fears, excessive worry, and persistent anxiety symptoms. Strategies: Strengthen her new nonavoidang approach and build confidence.   Diagnosis:   ICD-10-CM   1. Generalized anxiety disorder  F41.1      Plan: Patient today showing good motivation and active participation as she shared further progress she is making on her goals and feeling more confident that her progress "is real" and that she  is moving in a positive direction.  Has some really good family support.  Does and is setting some appropriate limits with certain family members as needed but feels good about it and is handling it in a way that is supportive and not confrontational.  Meeting more people as a result of her business growing, as noted above.  Symptoms reducing especially her anxiety, self doubt.   Saw her PCP recently as noted above who noticed a positive difference for patient and was encouraging to her also.  Better coping skills.  Setting better limits within the family and beyond.  Better able to manage issues when they arise rather than being overwhelmed by them.  While showing definite progress, she continues to need therapy and work on goal directed behaviors to solidify some of the changes she is working on and in some cases changes she is in process of making, in order to eventually go forward and be successful moving in a positive direction with enhanced self-esteem. Encouraged patient in her practice of positive behaviors as noted in session including: Not over personalizing situations, better management of anxiety and depression through use of strategies discussed in sessions, practice more self-acceptance, use appropriate boundaries and understand that she does not have to explain to every body when they ask questions, continue working on decreasing perfectionistic tendencies, stay in the present focusing on what she can control or change, keep contact with people who are supportive of her, get outside daily and walk, avoid assuming worst case scenarios, looking more for positives versus negatives each day, healthy nutrition and exercise, use of daily affirmations, positive self talk, talk reduce overthinking and over analyzing, challenge and counteract her self doubt, be involved in more things that she enjoys including 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  Goal review and progress/challenges noted with patient.  Next appointment within 3 weeks.  This record has been created using [employer].  Chart  creation errors have been sought, but may not always have been located and corrected.  Such creation errors do not reflect on the standard of medical care provided.   Shanon Ace, LCSW

## 2021-11-23 ENCOUNTER — Ambulatory Visit: Payer: BC Managed Care – PPO | Admitting: Adult Health

## 2021-11-23 ENCOUNTER — Ambulatory Visit: Payer: BC Managed Care – PPO | Admitting: Psychiatry

## 2021-11-23 ENCOUNTER — Encounter: Payer: Self-pay | Admitting: Adult Health

## 2021-11-23 DIAGNOSIS — F411 Generalized anxiety disorder: Secondary | ICD-10-CM

## 2021-11-23 DIAGNOSIS — F331 Major depressive disorder, recurrent, moderate: Secondary | ICD-10-CM | POA: Diagnosis not present

## 2021-11-23 DIAGNOSIS — F410 Panic disorder [episodic paroxysmal anxiety] without agoraphobia: Secondary | ICD-10-CM | POA: Diagnosis not present

## 2021-11-23 DIAGNOSIS — F422 Mixed obsessional thoughts and acts: Secondary | ICD-10-CM | POA: Diagnosis not present

## 2021-11-23 DIAGNOSIS — G4700 Insomnia, unspecified: Secondary | ICD-10-CM

## 2021-11-23 NOTE — Progress Notes (Signed)
[facility] Counselor/Therapist Progress Note  Patient ID: Loretta Nelson, MRN: [id number],    Date: 11/23/2021  Time Spent: 55 minutes   Treatment Type: Individual Therapy  Reported Symptoms: anxiety (improving some), less depression  Mental Status Exam:  Appearance:   Casual and Neat     Behavior:  Appropriate, Sharing, and Motivated  Motor:  Normal  Speech/Language:   Clear and Coherent  Affect:  anxious  Mood:  anxious  Thought process:  goal directed  Thought content:    WNL  Sensory/Perceptual disturbances:    WNL  Orientation:  oriented to person, place, time/date, situation, day of week, month of year, year, and stated date of Sept. 2023  Attention:  Good  Concentration:  Good  Memory:  WNL  Fund of knowledge:   Good  Insight:    Good  Judgment:   Good  Impulse Control:  Good   Risk Assessment: Danger to Self:  No Self-injurious Behavior: No Danger to Others: No Duty to Warn:no Physical Aggression / Violence:No  Access to Firearms a concern: No  Gang Involvement:No   Subjective: Patient in today reporting improvement in her anxiety and depression is also continuing to lessen. Affect continues to be more full, smiling, more and reports feeling better overall physically. Is continuing to work on her anxiety strategies, working on letting go of self-doubt and "trying to see myself being successful", and working to rid of negative thoughts about myself.  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 Personal health has become a real priority for her especially her blood pressure and stress management especially when things happen unexpectedly. Self-blaming decreased. Worrying decreasing some, and better at not letting the worry "over-take" her as much.  Interventions:  Cognitive Behavioral Therapy and Ego-Supportive  Treatment goals: Treatment goals remain on treatment plan as patient works with strategies to achieve her goals. Progress is assessed each session and documented in the "subject" and/or "plan" section of treatment note. Long-term goal: Reduce overall level, frequency, and intensity of the anxiety so that daily functioning is not impaired.  Short-term goal: Verbalize an understanding of the role that fearful thinking plays in creating fears, excessive worry, and persistent anxiety symptoms. Strategies: Strengthen her new nonavoidang approach and build confidence.    Diagnosis:   ICD-10-CM   1. Generalized anxiety disorder  F41.1      Plan:   Patient today showing good participation and motivation as she focused on her anxiety which is improving and also her depression which is lessening.  Her affect is noticeably more full and includes smiling occasionally.  Continues to work on letting go of self-doubt and tries to imagine herself being more successful and is already showing gains in both of those areas.  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  Focusing more on improved self-care and following through on goal-directed behaviors between sessions to continue her improvement and moving forward in a more positive direction.  Showing more self-confidence.  Good family support.  Is however, working to set some appropriate limits with some family members and trying  to handle it in a way that is supportive and does not feel punitive to the other person's.  Some reduction in self doubt.  Coping skills overall have improved.  Working hard to manage issues as they arise rather than waiting and being overwhelmed by them.  Self-esteem improving gradually.  Encouraged patient in practicing more positive behaviors as discussed in sessions including: Not over personalizing situations, practice more self-acceptance, use appropriate boundaries and understand that she does not have to explain to every body when they ask questions, better management of anxiety and depression through use of strategies discussed in sessions, continue working on decreasing perfectionistic tendencies, stay in the present focusing on what she can control or change, keep contact with people who are supportive of her, get outside daily and walk, avoid assuming worst case scenarios, looking more for positives versus negatives daily, healthy nutrition and exercise, use of daily affirmations, positive self talk, reduce overthinking and over analyzing, be involved in more things that she enjoys including 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  Goal review and progress/challenges noted with patient.  Next appointment within 2 to 3 weeks.  This record has been created using [employer].  Chart creation errors have been sought, but may not always have been located and corrected.  Such creation errors do not reflect on the standard of medical care provided.   Shanon Ace, LCSW

## 2021-11-23 NOTE — Progress Notes (Signed)
Loretta Nelson [id number] 06/19/1965 56 y.o.  Subjective:   Patient ID:  Loretta Nelson is a 56 y.o. (DOB Jul 06, 1965) female.  Chief Complaint: No chief complaint on file.   HPI Loretta Nelson presents to the office today for follow-up of MDD, GAD, panic attacks, insomnia, mixed obsessional thoughts.  Describes mood today as "a little better". Pleasant. Tearful at times. Mood symptoms - reports decreased depression. Feels anxious and irritability. Reports decreased worry and rumination - "playing things out - entertaining the what if's". Reports intrusive thoughts and ritualistic behaviors. Denies recent panic attacks. Mood is variable - "getting better". Feels like the increase in Wellbutrin from 331m to 4532mhas been helpful. Working with therapist. Remains on a leave of absence from work and does not feel like she is able to return at this time - works for [employer]ing therapist DeRinaldo CloudStable interest and motivation. Taking medications as prescribed. Energy levels improving. Active, trying to exercise more. Enjoys some usual interests and activities. Divorced. Mother living with her since 2037 serves as her care giver. Has 2 daughters at home - college and graduate school. Recovering from breast cancer. Appetite adequate - denies binge eating. Weight loss - 15 to 17 - 293  pounds today. Sleep has improved. Averages 7 to 8 hours with Trazadone. Decreased daytime napping. Focus and concentration difficulties - has to work hard to keep herself focused Still making lists and writing things down so she want forget things. Managing some aspects of household. Works for [employer] out of work currently. Denies SI or HI.  Denies AH or VH. Denies self harm. Denies substance use.  Previous medication trials: [facility]  Total Score (max 30 points ) 30        Review  of Systems: Review of Systems  Musculoskeletal:  Negative for gait problem.  Neurological:  Negative for tremors.  Psychiatric/Behavioral:         Please refer to HPI    Medications: I have reviewed the patient's current medications.  Current Outpatient Medications  Medication Sig Dispense Refill   acetaminophen (TYLENOL) 500 MG tablet Take 2 tablets (1,000 mg total) by mouth every 8 (eight) hours as needed. 30 tablet 0   amLODipine (NORVASC) 5 MG tablet Take 5 mg by mouth daily.   5   buPROPion (WELLBUTRIN XL) 150 MG 24 hr tablet Take three tablets every morning. 90 tablet 5   Cholecalciferol 4000 UNITS CAPS Take 4,000 Units by mouth daily.      desvenlafaxine (PRISTIQ) 100 MG 24 hr tablet Take 1 tablet (100 mg total) by mouth daily. 90 tablet 0   diclofenac Sodium (VOLTAREN) 1 % GEL Apply 1 application topically 4 (four) times daily as needed (knee pain.).     EDARBYCLOR 40-25 MG TABS Take 1 tablet by mouth daily.  5   ibuprofen (ADVIL) 800 MG tablet Take 1 tablet (800 mg total) by mouth every 8 (eight) hours as needed. 30 tablet 0   L-Methylfolate-B12-B6-B2 (CEREFOLIN) 07-29-48-5 MG TABS Take 1 tablet by mouth every morning. 90 tablet 3   levothyroxine (SYNTHROID) 175 MCG tablet Take 175 mcg by mouth daily before breakfast.   5   loratadine (CLARITIN) 10 MG tablet TAKE 1 TABLET BY MOUTH EVERY DAY 90 tablet 1   metoprolol (TOPROL-XL) 200 MG 24 hr tablet Take 200 mg by mouth daily.   5  naproxen (NAPROSYN) 500 MG tablet Take 1 tablet by mouth every 12 (twelve) hours as needed.     pantoprazole (PROTONIX) 40 MG tablet Take 40 mg by mouth 2 (two) times daily.      potassium chloride (KLOR-CON) 20 MEQ packet Take 40 mEq by mouth daily.      traZODone (DESYREL) 50 MG tablet TAKE 1/2 TO 1 TABLET BY MOUTH AT BEDTIME AS NEEDED 90 tablet 0   No current facility-administered medications for this visit.    Medication Side Effects: None  Allergies:  Allergies  Allergen Reactions   Other      Other reaction(s): rash/red   Tape Itching and Other (See Comments)    Adhesive tape--redness/inflammation/itching    Past Medical History:  Diagnosis Date   Allergic rhinitis    BARD1 gene mutation positive 6/54/6503   Complication of anesthesia    woke up during EGD   Dysphagia    Empty sella syndrome    Essential hypertension    Family history of breast cancer    Fibroids, intramural 02/17/2017   Gastroesophageal reflux disease    Generalized anxiety disorder    Graves disease 01/13/11   radioactive iodine treatment, 54.6 millicuries   Hiatal hernia    small   History of chemotherapy    History of hysterectomy 02/17/2017   History of migraine headaches    History of radiation therapy    Iron deficiency anemia    Malignant neoplasm of upper-outer quadrant of left breast in female, 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   Please see review of systems for further details on the patient's review from today.   Objective:   Physical Exam:  LMP 02/13/2017 (Exact Date)   Physical Exam Constitutional:      General: She is not in acute distress. Musculoskeletal:        General: No deformity.  Neurological:     Mental Status: She is alert and oriented to person, place, and time.     Coordination: Coordination normal.  Psychiatric:        Attention and Perception: Attention and perception normal. She does not perceive auditory or visual hallucinations.        Mood and Affect: Mood normal. Mood is not anxious or depressed. Affect is not labile, blunt, angry  or inappropriate.        Speech: Speech normal.        Behavior: Behavior normal.        Thought Content: Thought content normal. Thought content is not paranoid or delusional. Thought content does not include homicidal or suicidal ideation. Thought content does not include homicidal or suicidal plan.        Cognition and Memory: Cognition and memory normal.        Judgment: Judgment normal.     Comments: Insight intact     Lab Review:     Component Value Date/Time   NA 142 05/17/2021 1551   K 3.7 05/17/2021 1551   CL 103 05/17/2021 1551   CO2 33 (H) 05/17/2021  1551   GLUCOSE 97 05/17/2021 1551   BUN 14 05/17/2021 1551   CREATININE 0.84 05/17/2021 1551   CALCIUM 9.6 05/17/2021 1551   PROT 7.2 05/17/2021 1551   ALBUMIN 4.0 05/17/2021 1551   AST 18 05/17/2021 1551   ALT 16 05/17/2021 1551   ALKPHOS 75 05/17/2021 1551   BILITOT 0.3 05/17/2021 1551   GFRNONAA >60 05/17/2021 1551   GFRAA >60 10/24/2019 1323   GFRAA >60 04/10/2019 1240       Component Value Date/Time   WBC 6.2 05/17/2021 1551   WBC 5.3 11/23/2020 1140   RBC 4.28 05/17/2021 1551   HGB 11.5 (L) 05/17/2021 1551   HCT 36.1 05/17/2021 1551   PLT 187 05/17/2021 1551   MCV 84.3 05/17/2021 1551   MCH 26.9 05/17/2021 1551   MCHC 31.9 05/17/2021 1551   RDW 13.2 05/17/2021 1551   LYMPHSABS 1.8 05/17/2021 1551   MONOABS 0.5 05/17/2021 1551   EOSABS 0.3 05/17/2021 1551   BASOSABS 0.0 05/17/2021 1551    No results found for: "POCLITH", "LITHIUM"   No results found for: "PHENYTOIN", "PHENOBARB", "VALPROATE", "CBMZ"   .res Assessment: Plan:    Plan:  PDMP reviewed  Wellbutrin XL 134m - 3 daily - denies seizure history. Pristiq 1062mdaily Trazadone 5042mt hs  Consider Rexulti    Therapist - DebRinaldo Cloudime spent with patient was 25 minutes. Greater than 50% of face to face time with patient was spent on counseling and coordination of care.    Patient totally disabled and unable to work 09/29/2021  through 12/30/2021.  RTC 4 weeks  Patient advised to contact office with any questions, adverse effects, or acute worsening in signs and symptoms.  Diagnoses and all orders for this visit:  Major depressive disorder, recurrent episode, moderate (HCC)  Generalized anxiety disorder  Panic attacks  Mixed obsessional thoughts and acts  Insomnia, unspecified type     Please see After Visit Summary for patient specific instructions.  Future Appointments  Date Time Provider [place]/26/2023  5:00 PM DowShanon AceCSW CP-CP None  12/09/2021  1:00 PM DowShanon AceCSW CP-CP None  01/24/2022  3:00 PM LomDebbora PrestoP [facility] None  05/24/2022  3:15 PM GudNicholas LoseD [facility] None    No orders of the defined types were placed in this encounter.   -------------------------------

## 2021-11-24 ENCOUNTER — Ambulatory Visit: Payer: BC Managed Care – PPO | Admitting: Psychiatry

## 2021-11-24 ENCOUNTER — Ambulatory Visit: Payer: BC Managed Care – PPO | Admitting: Adult Health

## 2021-12-02 ENCOUNTER — Telehealth: Payer: Self-pay | Admitting: Adult Health

## 2021-12-02 NOTE — Telephone Encounter (Signed)
Notified Pt to pick up completed form as requested.

## 2021-12-09 ENCOUNTER — Ambulatory Visit: Payer: BC Managed Care – PPO | Admitting: Psychiatry

## 2021-12-09 DIAGNOSIS — F411 Generalized anxiety disorder: Secondary | ICD-10-CM

## 2021-12-09 NOTE — Progress Notes (Signed)
[facility] Counselor/Therapist Progress Note  Patient ID: Loretta Nelson, MRN: [id number],    Date: 12/09/2021  Time Spent: 50 minutes  Treatment Type: Individual Therapy  Reported Symptoms: anxiety  Mental Status Exam:  Appearance:   Well Groomed     Behavior:  Appropriate, Sharing, and Motivated  Motor:  Normal  Speech/Language:   Clear and Coherent  Affect:  anxious  Mood:  anxious  Thought process:  normal  Thought content:    WNL  Sensory/Perceptual disturbances:    WNL  Orientation:  oriented to person, place, time/date, situation, day of week, month of year, year, and stated date of Oct. 12, 2023  Attention:  Good  Concentration:  Good  Memory:  WNL  Fund of knowledge:   Good  Insight:    Good  Judgment:   Good  Impulse Control:  Good   Risk Assessment: Danger to Self:  No Self-injurious Behavior: No Danger to Others: No Duty to Warn:no Physical Aggression / Violence:No  Access to Firearms a concern: No  Gang Involvement:No   Subjective:   Patient in today reporting more improvement in her anxiety and depression. 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 Today feeling she is "in a better place and feels counseling and medication is really helping her in establishing healthier boundaries with others, recognizing triggers to my anxiety and working to decrease the anxiety, working on be able to receive support more, "I'm usually the one that takes care of others", and "being able to work and let my feelings out and feel there's hope for me, not beating up on myself quite as much now, and these are the things I need to keep working on to get stronger and more empowered and more self-assured." "Starting to feel more freedom".  Also reports starting to feel more energy to feel more energy.  Affect more full and including some smiling as  appropriate.  Appreciates homework in between sessions and does follow through.  Reports that blood pressure is better as anxiety has decreased.  Worrying is also decreasing as well as self blaming has significantly decreased.  Interventions: Cognitive Behavioral Therapy and Ego-Supportive  Treatment goals: Treatment goals remain on treatment plan as patient works with strategies to achieve her goals. Progress is assessed each session and documented in the "subject" and/or "plan" section of treatment note. Long-term goal: Reduce overall level, frequency, and intensity of the anxiety so that daily functioning is not impaired.  Short-term goal: Verbalize an understanding of the role that fearful thinking plays in creating fears, excessive worry, and persistent anxiety symptoms. Strategies: Strengthen her new nonavoidang approach and build confidence.   Diagnosis:   ICD-10-CM   1. Generalized anxiety disorder  F41.1      Plan:  Patient in today and showing active participation and good motivation focusing on her depression and anxiety which are both decreasing.  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  Self-esteem improving gradually.  Family continues to be supportive.  Self-doubt decreasing.  Self-care increasing less overwhelmed when things do not go as planned.  Is seeing more of  her progress and feeling very grateful per her report today.  Adds that she is "usually the one that takes care of others" and that she has had to pay attention to her own needs and is starting to see more positive results including being stronger and more empowered, less depressed, less anxious, more self-assured and more hope for continuing to make positive changes and feel better about herself and her future.  Encouraged patient in her practice of more positive behaviors as noted in session including: Practicing more self acceptance, use of appropriate boundaries and understand that she does not have to explain to everybody when they ask questions, not over personalizing situations and interactions, better management of anxiety and depression through use of strategies discussed in sessions, continue working on decreasing perfectionistic tendencies, stay in the present focusing on what she can control her change versus cannot, keeping contact with people who are supportive of her, get outside daily and walk, avoid assuming worst-case scenarios, look for more positives versus negatives each day, healthy nutrition and exercise, use of daily affirmations, positive self talk, reduce overthinking and over analyzing, be involved in more things that she enjoys including creative projects in her home business and outside activities with family and other people, saying no when she needs to say no, allowing her faith to be an emotional support as well as spiritual, and realize the strengths she shows working with goal-directed behaviors to move in a direction that supports her improved emotional health.  Goal review and progress/challenges noted with patient.  Next appointment within 3-4 weeks.  This record has been created using [employer].  Chart creation errors have been sought, but may not always have been located and corrected.  Such creation errors do not reflect on the standard of medical care provided.   Shanon Ace, LCSW

## 2021-12-22 ENCOUNTER — Ambulatory Visit: Payer: BC Managed Care – PPO | Admitting: Psychiatry

## 2021-12-30 ENCOUNTER — Ambulatory Visit: Payer: BC Managed Care – PPO | Admitting: Adult Health

## 2021-12-30 ENCOUNTER — Encounter: Payer: Self-pay | Admitting: Adult Health

## 2021-12-30 DIAGNOSIS — F411 Generalized anxiety disorder: Secondary | ICD-10-CM

## 2021-12-30 DIAGNOSIS — F422 Mixed obsessional thoughts and acts: Secondary | ICD-10-CM

## 2021-12-30 DIAGNOSIS — F331 Major depressive disorder, recurrent, moderate: Secondary | ICD-10-CM | POA: Diagnosis not present

## 2021-12-30 DIAGNOSIS — F410 Panic disorder [episodic paroxysmal anxiety] without agoraphobia: Secondary | ICD-10-CM

## 2021-12-30 DIAGNOSIS — G4700 Insomnia, unspecified: Secondary | ICD-10-CM

## 2021-12-30 MED ORDER — TRAZODONE HCL 50 MG PO TABS: 25.0000 mg | ORAL_TABLET | Freq: Every evening | ORAL | 1 refills | Status: DC | PRN

## 2021-12-30 MED ORDER — BUPROPION HCL ER (XL) 150 MG PO TB24: ORAL_TABLET | ORAL | 1 refills | Status: DC

## 2021-12-30 MED ORDER — DESVENLAFAXINE SUCCINATE ER 100 MG PO TB24: 100.0000 mg | ORAL_TABLET | Freq: Every day | ORAL | 1 refills | Status: DC

## 2021-12-30 NOTE — Progress Notes (Addendum)
Loretta Nelson [id number] 1965-12-21 56 y.o.  Subjective:   Patient ID:  Loretta Nelson is a 56 y.o. (DOB Apr 20, 1965) female.  Chief Complaint: No chief complaint on file.   HPI Loretta Nelson presents to the office today for follow-up of MDD, GAD, panic attacks, insomnia, mixed obsessional thoughts.  Describes mood today as "a little better". Pleasant. Tearful at times. Mood symptoms - reports  depression, anxiety and irritability - "when triggered". Reports decreased worry and rumination. Reports "some" intrusive thoughts and ritualistic behaviors. Denies recent panic attacks. Mood is more consistent. Stating "I have been experiencing more happiness". Feels like the medications are helpful. Working with therapist. Seeing therapist Rinaldo Cloud. Stable interest and motivation. Taking medications as prescribed. Energy levels stable. Active, trying to exercise more. Enjoys some usual interests and activities. Divorced. Mother living with her since 67 - serves as her care giver. Has 2 daughters at home - college and graduate school. Recovering from breast cancer. Appetite adequate. Weight loss - 20 pounds.    Sleep has improved. Averages 7 to 8 hours with Trazadone.   Focus and concentration difficulties - has to work hard to keep herself focused Still making lists and writing things down so she want forget things. Managing some aspects of household. Recently resigned from [employer]. Denies SI or HI.  Denies AH or VH. Denies self harm. Denies substance use.  Previous medication trials: [place] Office Visit from 01/15/2019 in [facility]  Total Score (max 30 points ) 30        Review of Systems:  Review of Systems  Musculoskeletal:  Negative for gait problem.  Neurological:  Negative for tremors.  Psychiatric/Behavioral:         Please refer to HPI    Medications: I have reviewed the patient's current medications.  Current  Outpatient Medications  Medication Sig Dispense Refill   acetaminophen (TYLENOL) 500 MG tablet Take 2 tablets (1,000 mg total) by mouth every 8 (eight) hours as needed. 30 tablet 0   amLODipine (NORVASC) 5 MG tablet Take 5 mg by mouth daily.   5   buPROPion (WELLBUTRIN XL) 150 MG 24 hr tablet Take three tablets every morning. 270 tablet 1   Cholecalciferol 4000 UNITS CAPS Take 4,000 Units by mouth daily.      desvenlafaxine (PRISTIQ) 100 MG 24 hr tablet Take 1 tablet (100 mg total) by mouth daily. 90 tablet 1   diclofenac Sodium (VOLTAREN) 1 % GEL Apply 1 application topically 4 (four) times daily as needed (knee pain.).     EDARBYCLOR 40-25 MG TABS Take 1 tablet by mouth daily.  5   ibuprofen (ADVIL) 800 MG tablet Take 1 tablet (800 mg total) by mouth every 8 (eight) hours as needed. 30 tablet 0   L-Methylfolate-B12-B6-B2 (CEREFOLIN) 07-29-48-5 MG TABS Take 1 tablet by mouth every morning. 90 tablet 3   levothyroxine (SYNTHROID) 175 MCG tablet Take 175 mcg by mouth daily before breakfast.   5   loratadine (CLARITIN) 10 MG tablet TAKE 1 TABLET BY MOUTH EVERY DAY 90 tablet 1   metoprolol (TOPROL-XL) 200 MG 24 hr tablet Take 200 mg by mouth daily.   5   naproxen (NAPROSYN) 500 MG tablet Take 1 tablet by mouth every 12 (twelve) hours as needed.     pantoprazole (PROTONIX) 40 MG tablet Take 40 mg by mouth 2 (two) times daily.      potassium chloride (KLOR-CON) 20 MEQ packet  Take 40 mEq by mouth daily.      traZODone (DESYREL) 50 MG tablet Take 0.5-1 tablets (25-50 mg total) by mouth at bedtime as needed. 90 tablet 1   No current facility-administered medications for this visit.    Medication Side Effects: None  Allergies:  Allergies  Allergen Reactions   Other     Other reaction(s): rash/red   Tape Itching and Other (See Comments)    Adhesive tape--redness/inflammation/itching    Past Medical History:  Diagnosis Date   Allergic rhinitis    BARD1 gene mutation positive 6/65/9935    Complication of anesthesia    woke up during EGD   Dysphagia    Empty sella syndrome    Essential hypertension    Family history of breast cancer    Fibroids, intramural 02/17/2017   Gastroesophageal reflux disease    Generalized anxiety disorder    Graves disease 01/13/11   radioactive iodine treatment, 70.1 millicuries   Hiatal hernia    small   History of chemotherapy    History of hysterectomy 02/17/2017   History of migraine headaches    History of radiation therapy    Iron deficiency anemia    Malignant neoplasm of upper-outer quadrant of left breast in female, 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   Please see review of systems for further details on the patient's review from today.   Objective:   Physical Exam:  LMP 02/13/2017 (Exact Date)   Physical Exam Constitutional:      General: She is not in acute distress. Musculoskeletal:        General: No deformity.  Neurological:     Mental Status: She is alert and oriented to person, place, and time.     Coordination: Coordination normal.  Psychiatric:        Attention and Perception: Attention and perception normal. She does not perceive auditory or visual hallucinations.        Mood and Affect: Mood normal. Mood is not anxious or depressed. Affect is not labile, blunt, angry or inappropriate.        Speech: Speech normal.        Behavior: Behavior normal.        Thought Content: Thought content normal. Thought content is not paranoid or delusional. Thought content does not include homicidal or  suicidal ideation. Thought content does not include homicidal or suicidal plan.        Cognition and Memory: Cognition and memory normal.        Judgment: Judgment normal.     Comments: Insight intact     Lab Review:     Component Value Date/Time   NA 142 05/17/2021 1551   K 3.7 05/17/2021 1551   CL 103 05/17/2021 1551   CO2 33 (H) 05/17/2021 1551   GLUCOSE 97 05/17/2021 1551   BUN 14 05/17/2021 1551   CREATININE 0.84 05/17/2021 1551   CALCIUM 9.6 05/17/2021 1551   PROT 7.2 05/17/2021 1551   ALBUMIN 4.0 05/17/2021 1551   AST 18 05/17/2021 1551  ALT 16 05/17/2021 1551   ALKPHOS 75 05/17/2021 1551   BILITOT 0.3 05/17/2021 1551   GFRNONAA >60 05/17/2021 1551   GFRAA >60 10/24/2019 1323   GFRAA >60 04/10/2019 1240       Component Value Date/Time   WBC 6.2 05/17/2021 1551   WBC 5.3 11/23/2020 1140   RBC 4.28 05/17/2021 1551   HGB 11.5 (L) 05/17/2021 1551   HCT 36.1 05/17/2021 1551   PLT 187 05/17/2021 1551   MCV 84.3 05/17/2021 1551   MCH 26.9 05/17/2021 1551   MCHC 31.9 05/17/2021 1551   RDW 13.2 05/17/2021 1551   LYMPHSABS 1.8 05/17/2021 1551   MONOABS 0.5 05/17/2021 1551   EOSABS 0.3 05/17/2021 1551   BASOSABS 0.0 05/17/2021 1551    No results found for: "POCLITH", "LITHIUM"   No results found for: "PHENYTOIN", "PHENOBARB", "VALPROATE", "CBMZ"   .res Assessment: Plan:    Plan:  PDMP reviewed  Wellbutrin XL 159m - 3 daily - denies seizure history. Pristiq 1068mdaily Trazadone 5010mt hs  Add Rexulti 0.5mg65mily - samples given.  Therapist - DebbRinaldo Cloudme spent with patient was 25 minutes. Greater than 50% of face to face time with patient was spent on counseling and coordination of care.    RTC 3 months  Patient advised to contact office with any questions, adverse effects, or acute worsening in signs and symptoms.   Diagnoses and all orders for this visit:  Generalized anxiety disorder  Major depressive disorder, recurrent episode,  moderate (HCC) -     desvenlafaxine (PRISTIQ) 100 MG 24 hr tablet; Take 1 tablet (100 mg total) by mouth daily. -     buPROPion (WELLBUTRIN XL) 150 MG 24 hr tablet; Take three tablets every morning. -     traZODone (DESYREL) 50 MG tablet; Take 0.5-1 tablets (25-50 mg total) by mouth at bedtime as needed.  Panic attacks  Insomnia, unspecified type  Mixed obsessional thoughts and acts     Please see After Visit Summary for patient specific instructions.  Future Appointments  Date Time Provider [place]/10/2021 11:00 AM DowdShanon AceSW CP-CP None  01/24/2022  3:00 PM LomaDebbora Presto [facility] None  05/24/2022  3:15 PM GudeNicholas Lose [facility] None    No orders of the defined types were placed in this encounter.   -------------------------------

## 2022-01-06 ENCOUNTER — Ambulatory Visit: Payer: BC Managed Care – PPO | Admitting: Psychiatry

## 2022-01-24 ENCOUNTER — Ambulatory Visit: Payer: BC Managed Care – PPO | Admitting: Family Medicine

## 2022-01-24 ENCOUNTER — Encounter: Payer: Self-pay | Admitting: Family Medicine

## 2022-01-24 VITALS — BP 148/78 | HR 65 | Ht 65.0 in | Wt 286.0 lb

## 2022-01-24 DIAGNOSIS — G4733 Obstructive sleep apnea (adult) (pediatric): Secondary | ICD-10-CM

## 2022-01-24 NOTE — Patient Instructions (Signed)

## 2022-01-24 NOTE — Progress Notes (Signed)
Faxed CPAP supplies order to [place] at [phone]. Received fax confirmation.

## 2022-01-24 NOTE — Progress Notes (Signed)
PATIENT: Loretta Nelson DOB: 08-19-1965  REASON FOR VISIT: follow up HISTORY FROM: patient  Chief Complaint  Patient presents with   Follow-up    RM10, alone. Changed jobs so schedule is off. CPAP is helping when she uses it.    HISTORY OF PRESENT ILLNESS:  01/24/22 ALL: Loretta Nelson returns for follow up for OSA on CPAP. She is doing well. 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 She is working with a Social worker. She feels mood is good. She is sleeping fairly well but admits that some nights she isn't getting as much sleep as she should. She does note benefit of using CPAP. She denies concerns with machine or supplies.     01/20/2021 ALL: Loretta Nelson returns for follow up for OSA on CPAP.   She completed neuropsych eval with Dr Melvyn Novas 02/2020. Although she did have some deficits in learning and retrieval aspects of memory, all other domains were normal. Chemo treatments most likely contributor of deficits. She is doing fairly well. She is using CPAP nightly without concerns. She did have a power outage recently and was unable to use her machine. She did not sleep well that night. Memory is stable. She does continue to have word finding difficulty at times. 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 She is able to perform job duties.     01/22/2020 ALL:  Loretta Nelson is a 56 y.o. female here today for follow up for OSA on CPAP.  She is doing very well with CPAP therapy since last being seen.  She is using CPAP nightly.  She does report improved sleep quality when using CPAP.  She has had some intermittent insomnia since starting Arimidex.  She has completed chemo and radiation.  She is followed closely by primary care and oncology.  She is requesting a new referral for neurocognitive evaluation as previously discussed with Dr. Leonie Man. She does continue to have difficulty multitasking and concentration. She  has word finding difficulty. She is able to work and maintain home. She can drive without difficulty and perform ADL's independently.   Compliance report dated 12/22/2019 through 01/20/2020 reveals that she used CPAP 30 of the past 30 for compliance of 100%.  She is CPAP greater than 4 hours 30 days for compliance of 100%.  Average usage was 7 hours and 58 minutes.  Residual AHI was 1 on a set pressure of 12 cm of water and EPR 3.  There was no significant leak noted.   HISTORY: (copied from Dr Guadelupe Sabin previous note)  Loretta Nelson is a 56 year old right-handed woman with an underlying medical history of hypertension, vitamin D deficiency, Graves' disease with status post radioactive iodine, reflux disease, arthritis, anxiety, anemia, allergic rhinitis, hypothyroidism, obesity with a BMI of over 77, and recent diagnosis of L sided breast cancer with status post surgery in February and adjuvant chemotherapy since March, with adjuvant radiation therapy pending, who Presents for follow-up consultation of her obstructive sleep apnea after interim testing and starting CPAP therapy.  The patient is unaccompanied today.  I first met her at the request of Dr. Leonie Man on 02/27/2019, at which time she reported snoring and daytime somnolence and had cognitive complaints.  She was advised to proceed with a sleep study.  She had a baseline sleep study, followed by a CPAP titration study.  Her baseline sleep study from 03/15/2019 showed severe obstructive sleep apnea with a total AHI  of 57.4/hour, REM AHI of 98.6/hour, supine AHI of 53/hour and O2 nadir of 73%. REM latency was delayed and REM percentage markedly reduced at 4%.  She was advised to return for a CPAP titration study.  She had this on 04/15/2019.  She was fitted with a nasal mask but would prefer nasal pillows at home as she indicated at the time of her sleep study.  She was titrated from 6 cm to 14 cm.  On the final pressure, her AHI was 0.9/h with supine REM sleep  achieved an O2 nadir at 90%.  She was advised to start CPAP therapy at home at a pressure of 14 cm.   She emailed at the end of March due to difficulty tolerating the pressure and I reduced it to 12 cm.    Today, 07/23/2019: I reviewed her CPAP compliance data from 06/22/2019 through 07/21/2019, which is a total of 30 days, during which time she used her machine every night with percent use days greater than 4 hours at 97%, indicating excellent compliance with an average usage of 6 hours, residual AHI at goal at 1.3/hour, Leak acceptable with a 95th percentile at 9.8 L/min on a pressure of 12 cm with EPR of 3.  She reports that she has had difficulty sleeping since starting chemotherapy. Especially the first 2 weeks after her chemotherapy cycle she is having a tough time. She was diagnosed with left-sided breast cancer in the middle of going through sleep study testing. She has 1 more chemotherapy cycle to go and will start adjuvant radiation therapy. She has a good support system, thankfully, both daughters have graduated college successfully. Her mother has moved him as well. She is motivated to continue with CPAP and tolerates the reduced pressure better. She is currently using a nasal cushion interface but also has a hybrid style fullface mask available. She has struggled with mouth dryness and tries to hydrate better. She had to delay her neuropsychological evaluation at [facility] neurology because of her cancer treatment. She is planning to reschedule her appointment and then schedule a follow-up with Dr. Leonie Man.  REVIEW OF SYSTEMS: Out of a complete 14 system review of symptoms, the patient complains only of the following symptoms, inattention, lack of concentration, intermittent insomnia and all other reviewed systems are negative.  ALLERGIES: Allergies  Allergen Reactions   Other     Other reaction(s): rash/red   Tape Itching and Other (See Comments)    Adhesive tape--redness/inflammation/itching     HOME MEDICATIONS: Outpatient Medications Prior to Visit  Medication Sig Dispense Refill   acetaminophen (TYLENOL) 500 MG tablet Take 2 tablets (1,000 mg total) by mouth every 8 (eight) hours as needed. 30 tablet 0   amLODipine (NORVASC) 5 MG tablet Take 5 mg by mouth daily.   5   Brexpiprazole (REXULTI) 0.5 MG TABS Take 0.5 mg by mouth daily.     buPROPion (WELLBUTRIN XL) 150 MG 24 hr tablet Take three tablets every morning. 270 tablet 1   Cholecalciferol 4000 UNITS CAPS Take 4,000 Units by mouth daily.      desvenlafaxine (PRISTIQ) 100 MG 24 hr tablet Take 1 tablet (100 mg total) by mouth daily. 90 tablet 1   diclofenac Sodium (VOLTAREN) 1 % GEL Apply 1 application topically 4 (four) times daily as needed (knee pain.).     EDARBYCLOR 40-25 MG TABS Take 1 tablet by mouth daily.  5   levothyroxine (SYNTHROID) 175 MCG tablet Take 175 mcg by mouth daily before breakfast.  5   loratadine (CLARITIN) 10 MG tablet TAKE 1 TABLET BY MOUTH EVERY DAY 90 tablet 1   metoprolol (TOPROL-XL) 200 MG 24 hr tablet Take 200 mg by mouth daily.   5   pantoprazole (PROTONIX) 40 MG tablet Take 40 mg by mouth 2 (two) times daily.      potassium chloride (KLOR-CON) 20 MEQ packet Take 40 mEq by mouth daily.      traZODone (DESYREL) 50 MG tablet Take 0.5-1 tablets (25-50 mg total) by mouth at bedtime as needed. 90 tablet 1   ibuprofen (ADVIL) 800 MG tablet Take 1 tablet (800 mg total) by mouth every 8 (eight) hours as needed. 30 tablet 0   L-Methylfolate-B12-B6-B2 (CEREFOLIN) 07-29-48-5 MG TABS Take 1 tablet by mouth every morning. 90 tablet 3   naproxen (NAPROSYN) 500 MG tablet Take 1 tablet by mouth every 12 (twelve) hours as needed.     No facility-administered medications prior to visit.    PAST MEDICAL HISTORY: Past Medical History:  Diagnosis Date   Allergic rhinitis    BARD1 gene mutation positive 8/84/1660   Complication of anesthesia    woke up during EGD   Dysphagia    Empty sella syndrome     Essential hypertension    Family history of breast cancer    Fibroids, intramural 02/17/2017   Gastroesophageal reflux disease    Generalized anxiety disorder    Graves disease 01/13/11   radioactive iodine treatment, 63.0 millicuries   Hiatal hernia    small   History of chemotherapy    History of hysterectomy 02/17/2017   History of migraine headaches    History of radiation therapy    Iron deficiency anemia    Malignant neoplasm of upper-outer quadrant of left breast in female, 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   Meralgia paresthetica    Murmur    benign, h/o, caused by hyperdynamic contraction   Obesity    Obstructive sleep apnea    Pneumonia 2017   inhaler prescribed   Postoperative hypothyroidism    Primary osteoarthritis of right foot 03/12/2018   Tendonitis of shoulder, right    Vitamin D deficiency     PAST SURGICAL HISTORY: Past Surgical History:  Procedure Laterality Date   ABDOMINAL HYSTERECTOMY  2018   BALLOON DILATION N/A 09/07/2016   Procedure: BALLOON DILATION;  Surgeon: Arta Silence, MD;  Location: [place];  Service: Endoscopy;  Laterality: N/A;   BREAST LUMPECTOMY Left 03/2019   BREAST LUMPECTOMY WITH RADIOACTIVE SEED AND SENTINEL LYMPH NODE BIOPSY Left 04/17/2019   Procedure: LEFT BREAST LUMPECTOMY WITH RADIOACTIVE SEED AND SENTINEL LYMPH NODE BIOPSY;  Surgeon: Donnie Mesa, MD;  Location: [place];  Service: General;  Laterality: Left;  LMA, PEC BLOCK   CESAREAN SECTION  1999   COLONOSCOPY     DILATION AND CURETTAGE OF UTERUS     ESOPHAGOGASTRODUODENOSCOPY     ESOPHAGOGASTRODUODENOSCOPY (EGD) WITH PROPOFOL N/A 09/07/2016   Procedure: ESOPHAGOGASTRODUODENOSCOPY (EGD) WITH PROPOFOL;  Surgeon: Arta Silence, MD;  Location: [place];  Service: Endoscopy;  Laterality: N/A;   HYSTERECTOMY ABDOMINAL WITH SALPINGECTOMY Bilateral 02/17/2017   Procedure: HYSTERECTOMY ABDOMINAL WITH SALPINGECTOMY;  Surgeon: Christophe Louis, MD;  Location: [facility];  Service:  Gynecology;  Laterality: Bilateral;   KNEE ARTHROSCOPY     for torn meniscus   LAPAROSCOPIC SALPINGO OOPHERECTOMY N/A 02/12/2020   Procedure: DIAGNOSTIC LAPAROSCOPY ;  Surgeon: Christophe Louis, MD;  Location: [place];  Service: Gynecology;  Laterality: N/A;   LAPAROTOMY Bilateral 02/12/2020   Procedure:  EXPLORATORY LAPAROTOMY WITH BILATERAL SALPINGO OOPHORECTOMY;  Surgeon: Christophe Louis, MD;  Location: [place];  Service: Gynecology;  Laterality: Bilateral;   LYSIS OF ADHESION N/A 02/12/2020   Procedure: LYSIS OF ADHESION;  Surgeon: Christophe Louis, MD;  Location: [place];  Service: Gynecology;  Laterality: N/A;   PORT-A-CATH REMOVAL Right 01/07/2020   Procedure: REMOVAL PORT-A-CATH;  Surgeon: Donnie Mesa, MD;  Location: [facility];  Service: General;  Laterality: Right;   PORTACATH PLACEMENT Right 05/22/2019   Procedure: INSERTION PORT-A-CATH WITH ULTRASOUND GUIDANCE;  Surgeon: Donnie Mesa, MD;  Location: [facility];  Service: General;  Laterality: Right;  Endotrachial tube   WISDOM TOOTH EXTRACTION     WISDOM TOOTH EXTRACTION      FAMILY HISTORY: Family History  Problem Relation Age of Onset   Hypertension Mother    Alcoholism Father    CAD Maternal Grandfather    Breast cancer Paternal Grandmother    HIV Brother    Heart attack Brother        sudden MI vs PE   Breast cancer Sister 41   Breast cancer Other        one dx 49s, others dx in 45s   Alzheimer's disease Other    Colon polyps Neg Hx    Colon cancer Neg Hx    Liver disease Neg Hx     SOCIAL HISTORY: Social History   Socioeconomic History   Marital status: Divorced    Spouse name: Not on file   Number of children: Not on file   Years of education: 18   Highest education level: Master's degree (e.g., MA, MS, MEng, MEd, MSW, MBA)  Occupational History   Not on file  Tobacco Use   Smoking status: Never   Smokeless tobacco: Never  Vaping Use   Vaping Use: Never used  Substance and Sexual Activity   Alcohol use: No   Drug use: No    Sexual activity: Not Currently    Birth control/protection: Abstinence  Other Topics Concern   Not on file  Social History Narrative   Not on file   Social Determinants of Health   Financial Resource Strain: Not on file  Food Insecurity: Not on file  Transportation Needs: Not on file  Physical Activity: Not on file  Stress: Not on file  Social Connections: Not on file  Intimate Partner Violence: Not on file     PHYSICAL EXAM  Vitals:   01/24/22 1456  BP: (!) 148/78  Pulse: 65  Weight: 286 lb (129.7 kg)  Height: _0  (1.651 m)     Body mass index is 47.59 kg/m.  Generalized: Well developed, in no acute distress  Cardiology: normal rate and rhythm, no murmur noted Respiratory: clear to auscultation bilaterally  Neurological examination  Mentation: Alert oriented to time, place, history taking. Follows all commands speech and language fluent Cranial nerve II-XII: Pupils were equal round reactive to light. Extraocular movements were full, visual field were full  Motor: The motor testing reveals 5 over 5 strength of all 4 extremities. Good symmetric motor tone is noted throughout.  Gait and station: Gait is normal.    DIAGNOSTIC DATA (LABS, IMAGING, TESTING) - I reviewed patient records, labs, notes, testing and imaging myself where available.     01/15/2019    3:40 PM  MMSE - Mini Mental State Exam  Orientation to time 5  Orientation to Place 5  Registration 3  Attention/ Calculation 5  Recall 3  Language- name 2 objects 2  Language- repeat 1  Language- follow 3 step command 3  Language- read & follow direction 1  Write a sentence 1  Copy design 1  Total score 30     Lab Results  Component Value Date   WBC 6.2 05/17/2021   HGB 11.5 (L) 05/17/2021   HCT 36.1 05/17/2021   MCV 84.3 05/17/2021   PLT 187 05/17/2021      Component Value Date/Time   NA 142 05/17/2021 1551   K 3.7 05/17/2021 1551   CL 103 05/17/2021 1551   CO2 33 (H) 05/17/2021 1551    GLUCOSE 97 05/17/2021 1551   BUN 14 05/17/2021 1551   CREATININE 0.84 05/17/2021 1551   CALCIUM 9.6 05/17/2021 1551   PROT 7.2 05/17/2021 1551   ALBUMIN 4.0 05/17/2021 1551   AST 18 05/17/2021 1551   ALT 16 05/17/2021 1551   ALKPHOS 75 05/17/2021 1551   BILITOT 0.3 05/17/2021 1551   GFRNONAA >60 05/17/2021 1551   GFRAA >60 10/24/2019 1323   GFRAA >60 04/10/2019 1240   No results found for: "CHOL", "HDL", "LDLCALC", "LDLDIRECT", "TRIG", "CHOLHDL" No results found for: "HGBA1C" Lab Results  Component Value Date   VITAMINB12 256 01/15/2019   No results found for: "TSH"   ASSESSMENT AND PLAN 56 y.o. year old female  has a past medical history of Allergic rhinitis, BARD1 gene mutation positive (3/00/7622), Complication of anesthesia, Dysphagia, Empty sella syndrome, Essential hypertension, Family history of breast cancer, Fibroids, intramural (02/17/2017), Gastroesophageal reflux disease, Generalized anxiety disorder, Graves disease (01/13/11), Hiatal hernia, History of chemotherapy, History of hysterectomy (02/17/2017), History of migraine headaches, History of radiation therapy, Iron deficiency anemia, Malignant neoplasm of upper-outer quadrant of left breast in female, estrogen receptor positive (04/05/2019), Meralgia paresthetica, Murmur, Obesity, Obstructive sleep apnea, Pneumonia (2017), Postoperative hypothyroidism, Primary osteoarthritis of right foot (03/12/2018), Tendonitis of shoulder, right, and Vitamin D deficiency. here with     ICD-10-CM   1. OSA on CPAP  G47.33 For home use only DME continuous positive airway pressure (CPAP)      Lakia A Amick is doing well on CPAP therapy. Compliance report reveals excellent compliance. She was encouraged to continue using CPAP nightly and for greater than 4 hours each night. We will update supply orders as indicated. Risks of untreated sleep apnea review and education materials provided.  She will continue memory compensation  strategies. Healthy lifestyle habits encouraged. She will follow up in 1 year, sooner if needed. She verbalizes understanding and agreement with this plan.   Orders Placed This Encounter  Procedures   For home use only DME continuous positive airway pressure (CPAP)    Supplies    Order Specific Question:   Length of Need    Answer:   Lifetime    Order Specific Question:   Patient has OSA or probable OSA    Answer:   Yes    Order Specific Question:   Is the patient currently using CPAP in the home    Answer:   Yes    Order Specific Question:   Settings    Answer:   Other see comments    Order Specific Question:   CPAP supplies needed    Answer:   Mask, headgear, cushions, filters, heated tubing and water chamber      No orders of the defined types were placed in this encounter.      Loretta Presto, FNP-C 01/24/2022, 3:37 PM [facility] [address] [place], [place] [address] [phone]

## 2022-01-27 ENCOUNTER — Ambulatory Visit: Payer: BC Managed Care – PPO | Admitting: Adult Health

## 2022-02-03 ENCOUNTER — Ambulatory Visit (INDEPENDENT_AMBULATORY_CARE_PROVIDER_SITE_OTHER): Payer: Commercial Managed Care - HMO | Admitting: Adult Health

## 2022-02-03 ENCOUNTER — Encounter: Payer: Self-pay | Admitting: Adult Health

## 2022-02-03 ENCOUNTER — Encounter: Payer: Self-pay | Admitting: Oncology

## 2022-02-03 DIAGNOSIS — F411 Generalized anxiety disorder: Secondary | ICD-10-CM

## 2022-02-03 DIAGNOSIS — F422 Mixed obsessional thoughts and acts: Secondary | ICD-10-CM | POA: Diagnosis not present

## 2022-02-03 DIAGNOSIS — F331 Major depressive disorder, recurrent, moderate: Secondary | ICD-10-CM | POA: Diagnosis not present

## 2022-02-03 DIAGNOSIS — F410 Panic disorder [episodic paroxysmal anxiety] without agoraphobia: Secondary | ICD-10-CM | POA: Diagnosis not present

## 2022-02-03 DIAGNOSIS — G4700 Insomnia, unspecified: Secondary | ICD-10-CM

## 2022-02-03 MED ORDER — REXULTI 0.5 MG PO TABS: 0.5000 mg | ORAL_TABLET | Freq: Every day | ORAL | 2 refills | Status: DC

## 2022-02-03 NOTE — Progress Notes (Signed)
Loretta Nelson [id number] [birth year] 56 y.o.  Subjective:   Patient ID:  Loretta Nelson is a 56 y.o. (DOB 24-Jul-1965) female.  Chief Complaint: No chief complaint on file.   HPI Loretta Nelson presents to the office today for follow-up of MDD, GAD, panic attacks, insomnia, mixed obsessional thoughts.  Describes mood today as "better". Pleasant. Tearful at times - "just a little better". Mood symptoms - reports decreased depression, anxiety and irritability. Still struggling with some anxiety - "trying to slow down and process things". Reports decreased worry and rumination. Reports intrusive thoughts - "playing them out in my mind". Denies recent panic attacks. Mood is more consistent. Stating "I'm feeling much better". Feels like the addition of Rexulti has been helpful. Working with therapist. Seeing therapist Rinaldo Cloud. Stable interest and motivation. Taking medications as prescribed. Energy levels stable. Active, trying to exercise more. Enjoys some usual interests and activities. Divorced. Mother living with her since 56 - serves as her care giver. Has 2 daughters at home - college and graduate school. Recovering from breast cancer. Appetite adequate. Weight loss - 24 pounds.    Sleep has improved. Averages 7 to 8 hours with Trazadone.   Focus and concentration improving. Managing some aspects of household. Recently resigned from [employer]. Denies SI or HI.  Denies AH or VH. Denies self harm. Denies substance use.  Previous medication trials: [place] Office Visit from 01/15/2019 in [facility]  Total Score (max 30 points ) 30        Review of Systems:  Review of Systems  Musculoskeletal:  Negative for gait problem.  Neurological:  Negative for tremors.  Psychiatric/Behavioral:         Please refer to HPI    Medications: I have reviewed the patient's current medications.  Current Outpatient Medications  Medication  Sig Dispense Refill   acetaminophen (TYLENOL) 500 MG tablet Take 2 tablets (1,000 mg total) by mouth every 8 (eight) hours as needed. 30 tablet 0   amLODipine (NORVASC) 5 MG tablet Take 5 mg by mouth daily.   5   Brexpiprazole (REXULTI) 0.5 MG TABS Take 1 tablet (0.5 mg total) by mouth daily. 30 tablet 2   buPROPion (WELLBUTRIN XL) 150 MG 24 hr tablet Take three tablets every morning. 270 tablet 1   Cholecalciferol 4000 UNITS CAPS Take 4,000 Units by mouth daily.      desvenlafaxine (PRISTIQ) 100 MG 24 hr tablet Take 1 tablet (100 mg total) by mouth daily. 90 tablet 1   diclofenac Sodium (VOLTAREN) 1 % GEL Apply 1 application topically 4 (four) times daily as needed (knee pain.).     EDARBYCLOR 40-25 MG TABS Take 1 tablet by mouth daily.  5   levothyroxine (SYNTHROID) 175 MCG tablet Take 175 mcg by mouth daily before breakfast.   5   loratadine (CLARITIN) 10 MG tablet TAKE 1 TABLET BY MOUTH EVERY DAY 90 tablet 1   metoprolol (TOPROL-XL) 200 MG 24 hr tablet Take 200 mg by mouth daily.   5   pantoprazole (PROTONIX) 40 MG tablet Take 40 mg by mouth 2 (two) times daily.      potassium chloride (KLOR-CON) 20 MEQ packet Take 40 mEq by mouth daily.      traZODone (DESYREL) 50 MG tablet Take 0.5-1 tablets (25-50 mg total) by mouth at bedtime as needed. 90 tablet 1   No current facility-administered medications for this visit.    Medication Side Effects:  None  Allergies:  Allergies  Allergen Reactions   Other     Other reaction(s): rash/red   Tape Itching and Other (See Comments)    Adhesive tape--redness/inflammation/itching    Past Medical History:  Diagnosis Date   Allergic rhinitis    BARD1 gene mutation positive 1/63/8466   Complication of anesthesia    woke up during EGD   Dysphagia    Empty sella syndrome    Essential hypertension    Family history of breast cancer    Fibroids, intramural 02/17/2017   Gastroesophageal reflux disease    Generalized anxiety disorder    Graves  disease 01/13/11   radioactive iodine treatment, 59.9 millicuries   Hiatal hernia    small   History of chemotherapy    History of hysterectomy 02/17/2017   History of migraine headaches    History of radiation therapy    Iron deficiency anemia    Malignant neoplasm of upper-outer quadrant of left breast in female, 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   Please see review of systems for further details on the patient's review from today.   Objective:   Physical Exam:  LMP 02/13/2017 (Exact Date)   Physical Exam Constitutional:      General: She is not in acute distress. Musculoskeletal:        General: No deformity.  Neurological:     Mental Status: She is alert and oriented to person, place, and time.     Coordination: Coordination normal.  Psychiatric:        Attention and Perception: Attention and perception normal. She does not perceive auditory or visual hallucinations.        Mood and Affect: Mood normal. Mood is not anxious or depressed. Affect is not labile, blunt, angry or inappropriate.        Speech: Speech normal.        Behavior: Behavior normal.        Thought Content: Thought content normal. Thought content is not paranoid or delusional. Thought content does not include homicidal or suicidal ideation. Thought content does not include homicidal or suicidal plan.        Cognition and Memory: Cognition and memory normal.        Judgment: Judgment normal.     Comments: Insight intact     Lab Review:     Component Value Date/Time   NA 142  05/17/2021 1551   K 3.7 05/17/2021 1551   CL 103 05/17/2021 1551   CO2 33 (H) 05/17/2021 1551   GLUCOSE 97 05/17/2021 1551   BUN 14 05/17/2021 1551   CREATININE 0.84 05/17/2021 1551   CALCIUM 9.6 05/17/2021 1551   PROT 7.2 05/17/2021 1551   ALBUMIN 4.0 05/17/2021 1551   AST 18 05/17/2021 1551   ALT 16 05/17/2021 1551   ALKPHOS 75 05/17/2021 1551   BILITOT 0.3 05/17/2021 1551   GFRNONAA >60 05/17/2021 1551   GFRAA >60 10/24/2019 1323   GFRAA >60 04/10/2019 1240       Component Value Date/Time  WBC 6.2 05/17/2021 1551   WBC 5.3 11/23/2020 1140   RBC 4.28 05/17/2021 1551   HGB 11.5 (L) 05/17/2021 1551   HCT 36.1 05/17/2021 1551   PLT 187 05/17/2021 1551   MCV 84.3 05/17/2021 1551   MCH 26.9 05/17/2021 1551   MCHC 31.9 05/17/2021 1551   RDW 13.2 05/17/2021 1551   LYMPHSABS 1.8 05/17/2021 1551   MONOABS 0.5 05/17/2021 1551   EOSABS 0.3 05/17/2021 1551   BASOSABS 0.0 05/17/2021 1551    No results found for: "POCLITH", "LITHIUM"   No results found for: "PHENYTOIN", "PHENOBARB", "VALPROATE", "CBMZ"   .res Assessment: Plan:    Plan:  PDMP reviewed  Wellbutrin XL 173m - 3 daily - denies seizure history. Pristiq 1060mdaily Trazadone 5027mt hs  Rexulti 0.5mg64mily - samples given.  Therapist - DebbRinaldo Cloudme spent with patient was 25 minutes. Greater than 50% of face to face time with patient was spent on counseling and coordination of care.    RTC 3 months  Patient advised to contact office with any questions, adverse effects, or acute worsening in signs and symptoms.   Diagnoses and all orders for this visit:  Major depressive disorder, recurrent episode, moderate (HCC) -     Brexpiprazole (REXULTI) 0.5 MG TABS; Take 1 tablet (0.5 mg total) by mouth daily.  Generalized anxiety disorder  Mixed obsessional thoughts and acts  Panic attacks  Insomnia, unspecified type     Please see After Visit Summary for patient specific instructions.  Future  Appointments  Date Time Provider DepaHecker26/2024  3:15 PM [facility]icholas Lose [facility]  01/16/2023  2:30 PM Lomax, Amy, NP [facility] None    No orders of the defined types were placed in this encounter.   -------------------------------

## 2022-02-08 ENCOUNTER — Other Ambulatory Visit: Payer: Self-pay | Admitting: Adult Health

## 2022-02-08 DIAGNOSIS — F331 Major depressive disorder, recurrent, moderate: Secondary | ICD-10-CM

## 2022-02-09 NOTE — Telephone Encounter (Signed)
Needs PA for Rexulti. Rx sent but samples given.

## 2022-02-14 NOTE — Telephone Encounter (Signed)
Prior Approval received for Rexulti 0.5 mg [id number] effective 02/14/2022 through 02/14/2023;

## 2022-03-03 ENCOUNTER — Encounter: Payer: Self-pay | Admitting: *Deleted

## 2022-03-03 ENCOUNTER — Encounter: Payer: Self-pay | Admitting: Oncology

## 2022-03-03 ENCOUNTER — Encounter: Payer: Self-pay | Admitting: Hematology and Oncology

## 2022-03-03 NOTE — Progress Notes (Signed)
Received mychart message from pt with complaint of left breast changes and left axilla nodule.  Pt requesting breast exam for further evaluation.  [facility] visit scheduled, pt verbalized understanding of appt date and time.

## 2022-03-04 ENCOUNTER — Encounter: Payer: Self-pay | Admitting: Adult Health

## 2022-03-04 ENCOUNTER — Other Ambulatory Visit: Payer: Self-pay

## 2022-03-04 ENCOUNTER — Encounter: Payer: Self-pay | Admitting: Oncology

## 2022-03-04 ENCOUNTER — Inpatient Hospital Stay: Payer: Commercial Managed Care - HMO | Attending: Adult Health | Admitting: Adult Health

## 2022-03-04 VITALS — BP 151/72 | HR 60 | Temp 97.8°F | Resp 16 | Ht 65.0 in | Wt 288.2 lb

## 2022-03-04 DIAGNOSIS — C50412 Malignant neoplasm of upper-outer quadrant of left female breast: Secondary | ICD-10-CM

## 2022-03-04 DIAGNOSIS — Z170 Estrogen receptor positive status [ER+]: Secondary | ICD-10-CM | POA: Diagnosis not present

## 2022-03-04 DIAGNOSIS — N6321 Unspecified lump in the left breast, upper outer quadrant: Secondary | ICD-10-CM

## 2022-03-04 DIAGNOSIS — Z853 Personal history of malignant neoplasm of breast: Secondary | ICD-10-CM | POA: Diagnosis present

## 2022-03-04 DIAGNOSIS — Z1501 Genetic susceptibility to malignant neoplasm of breast: Secondary | ICD-10-CM

## 2022-03-04 DIAGNOSIS — Z08 Encounter for follow-up examination after completed treatment for malignant neoplasm: Secondary | ICD-10-CM | POA: Diagnosis not present

## 2022-03-04 NOTE — Progress Notes (Signed)
Loretta Nelson Cancer Follow up:    Loretta Stains, MD [address] [place] [address]   DIAGNOSIS:  Cancer Staging  Malignant neoplasm of upper-outer quadrant of left breast in female, estrogen receptor positive Staging form: Breast, AJCC 8th Edition - Clinical stage from 04/10/2019: Stage IB (cT1b, cN0, cM0, G3, ER+, PR-, HER2-) - Unsigned Stage prefix: Initial diagnosis Method of lymph node assessment: Clinical Histologic grading system: 3 grade system - Pathologic stage from 04/17/2019: Stage IB (pT1c, pN0, cM0, G3, ER-, PR-, HER2-) - Signed by Loretta Phlegm, NP on 05/01/2019 Stage prefix: Initial diagnosis Histologic grading system: 3 grade system   SUMMARY OF ONCOLOGIC HISTORY: Oncology History  Malignant neoplasm of upper-outer quadrant of left breast in female, estrogen receptor positive  04/03/2019 Initial Diagnosis   04/03/2019: Left breast T1 BN 0 stage Ib grade 3 IDC functional triple negative, ER 10% weak, PR 0%, HER2 negative, Ki-67 30%    04/17/2019 Cancer Staging   Staging form: Breast, AJCC 8th Edition - Pathologic stage from 04/17/2019: Stage IB (pT1c, pN0, cM0, G3, ER-, PR-, HER2-) - Signed by Loretta Phlegm, NP on 05/01/2019   04/17/2019 Surgery   Left lumpectomy: T1 cN0 stage Ib grade 3 IDC triple negative with negative margins, 0/2 lymph nodes negative    04/19/2019 Genetic Testing   Likely pathogenic variant in BARD1 called c.2242G>T identified on the [facility] Breast Cancer STAT Panel + Common Hereditary Cancers Panel. The report date is 04/19/2019. Remainder of testing was normal.  Recommendation: Breast MRIs need to be done annually as well because of BARD 1 gene mutation.  Multiple family members have been affected with breast cancer.   The STAT Breast cancer panel offered by [facility] includes sequencing and rearrangement analysis for the following 9 genes:  ATM, BRCA1, BRCA2, CDH1, CHEK2, PALB2, PTEN, STK11 and TP53.     The Common Hereditary Cancers Panel offered by [facility] includes sequencing and/or deletion duplication testing of the following 48 genes: APC, ATM, AXIN2, BARD1, BMPR1A, BRCA1, BRCA2, BRIP1, CDH1, CDKN2A (p14ARF), CDKN2A (p16INK4a), CKD4, CHEK2, CTNNA1, DICER1, EPCAM (Deletion/duplication testing only), GREM1 (promoter region deletion/duplication testing only), KIT, MEN1, MLH1, MSH2, MSH3, MSH6, MUTYH, NBN, NF1, NHTL1, PALB2, PDGFRA, PMS2, POLD1, POLE, PTEN, RAD50, RAD51C, RAD51D, RNF43, SDHB, SDHC, SDHD, SMAD4, SMARCA4. STK11, TP53, TSC1, TSC2, and VHL.  The following genes were evaluated for sequence changes only: SDHA and HOXB13 c.251G>A variant only.   05/23/2019 - 07/27/2019 Chemotherapy   Patient is on Treatment Plan : BREAST TC q21d     08/13/2019 - 09/27/2019 Radiation Therapy   The patient initially received a dose of 50.4 Gy in 28 fractions to the breast using whole-breast tangent fields. This was delivered using a 3-D conformal technique. The patient then received a boost to the seroma. This delivered an additional 10 Gy in 5 fractions using a 3-field photon boost technique. The total dose was 60.4 Gy.    10/24/2019 - 05/20/2021 Anti-estrogen oral therapy   Anastrozole started 10/24/2019 discontinued 05/20/2021 due to multiple side effects including hot flashes, joint stiffness, mood swings, worsening anxiety, weight gain etc. Because the final pathology was triple negative VG did not think she would benefit from antiestrogen therapy.     CURRENT THERAPY: Observation  INTERVAL HISTORY: Loretta Nelson 57 y.o. female returns fofollow-up of a new breast lump in her left breast that she discovered 2 weeks ago along with pain.  Her most recent mammogram occurred on May 03, 2021 demonstrating  no mammographic evidence of malignancy and breast density category A.  She also underwent an MRI of both of her breast in September 2023 that was negative for malignancy.  Over the past couple of weeks  she had a new left breast lump when she was doing a self breast exam while in the shower.  She is also noticed a change in her breast structure around the area of her left breast seroma.  She is concerned about these breast changes especially in light of the fact that she has a BARD1 genetic mutation.   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  MEDICAL HISTORY: Past Medical History:  Diagnosis Date   Allergic rhinitis    BARD1 gene mutation positive 08/25/3149   Complication of anesthesia    woke up during EGD   Dysphagia    Empty sella syndrome    Essential hypertension    Family history of breast cancer    Fibroids, intramural 02/17/2017   Gastroesophageal reflux disease    Generalized anxiety disorder    Graves disease 01/13/11   radioactive iodine treatment, 76.1 millicuries   Hiatal hernia    small   History of chemotherapy    History of hysterectomy 02/17/2017   History of migraine headaches    History of radiation therapy    Iron deficiency anemia    Malignant  neoplasm of upper-outer quadrant of left breast in female, 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   Meralgia paresthetica    Murmur    benign, h/o, caused by hyperdynamic contraction   Obesity    Obstructive sleep apnea    Pneumonia 2017   inhaler prescribed   Postoperative hypothyroidism    Primary osteoarthritis of right foot 03/12/2018   Tendonitis of shoulder, right    Vitamin D deficiency     SURGICAL HISTORY: Past Surgical History:  Procedure Laterality Date   ABDOMINAL HYSTERECTOMY  2018   BALLOON DILATION N/A 09/07/2016   Procedure: BALLOON DILATION;  Surgeon: Arta Silence, MD;  Location: [facility];  Service: Endoscopy;  Laterality: N/A;   BREAST LUMPECTOMY Left 03/2019   BREAST LUMPECTOMY WITH RADIOACTIVE SEED AND SENTINEL LYMPH NODE BIOPSY Left 04/17/2019   Procedure: LEFT BREAST LUMPECTOMY WITH RADIOACTIVE SEED AND SENTINEL LYMPH NODE BIOPSY;  Surgeon: Donnie Mesa, MD;  Location: [facility];  Service: General;  Laterality: Left;  LMA, PEC BLOCK   CESAREAN SECTION  1999   COLONOSCOPY     DILATION AND CURETTAGE OF UTERUS     ESOPHAGOGASTRODUODENOSCOPY  ESOPHAGOGASTRODUODENOSCOPY (EGD) WITH PROPOFOL N/A 09/07/2016   Procedure: ESOPHAGOGASTRODUODENOSCOPY (EGD) WITH PROPOFOL;  Surgeon: Arta Silence, MD;  Location: [facility];  Service: Endoscopy;  Laterality: N/A;   HYSTERECTOMY ABDOMINAL WITH SALPINGECTOMY Bilateral 02/17/2017   Procedure: HYSTERECTOMY ABDOMINAL WITH SALPINGECTOMY;  Surgeon: Christophe Louis, MD;  Location: [facility];  Service: Gynecology;  Laterality: Bilateral;   KNEE ARTHROSCOPY     for torn meniscus   LAPAROSCOPIC SALPINGO OOPHERECTOMY N/A 02/12/2020   Procedure: DIAGNOSTIC LAPAROSCOPY ;  Surgeon: Christophe Louis, MD;  Location: [facility];  Service: Gynecology;  Laterality: N/A;   LAPAROTOMY Bilateral 02/12/2020   Procedure: EXPLORATORY LAPAROTOMY WITH BILATERAL SALPINGO OOPHORECTOMY;  Surgeon: Christophe Louis, MD;  Location: [facility];  Service: Gynecology;  Laterality:  Bilateral;   LYSIS OF ADHESION N/A 02/12/2020   Procedure: LYSIS OF ADHESION;  Surgeon: Christophe Louis, MD;  Location: [facility];  Service: Gynecology;  Laterality: N/A;   PORT-A-CATH REMOVAL Right 01/07/2020   Procedure: REMOVAL PORT-A-CATH;  Surgeon: Donnie Mesa, MD;  Location: [facility];  Service: General;  Laterality: Right;   PORTACATH PLACEMENT Right 05/22/2019   Procedure: INSERTION PORT-A-CATH WITH ULTRASOUND GUIDANCE;  Surgeon: Donnie Mesa, MD;  Location: [facility];  Service: General;  Laterality: Right;  Endotrachial tube   WISDOM TOOTH EXTRACTION     WISDOM TOOTH EXTRACTION      SOCIAL HISTORY: Social History   Socioeconomic History   Marital status: Divorced    Spouse name: Not on file   Number of children: Not on file   Years of education: 18   Highest education level: Master's degree (e.g., MA, MS, MEng, MEd, MSW, MBA)  Occupational History   Not on file  Tobacco Use   Smoking status: Never   Smokeless tobacco: Never  Vaping Use   Vaping Use: Never used  Substance and Sexual Activity   Alcohol use: No   Drug use: No   Sexual activity: Not Currently    Birth control/protection: Abstinence  Other Topics Concern   Not on file  Social History Narrative   Not on file   Social Determinants of Health   Financial Resource Strain: Not on file  Food Insecurity: Not on file  Transportation Needs: Not on file  Physical Activity: Not on file  Stress: Not on file  Social Connections: Not on file  Intimate Partner Violence: Not on file    FAMILY HISTORY: Family History  Problem Relation Age of Onset   Hypertension Mother    Alcoholism Father    CAD Maternal Grandfather    Breast cancer Paternal Grandmother    HIV Brother    Heart attack Brother        sudden MI vs PE   Breast cancer Sister 55   Breast cancer Other        one dx 57s, others dx in 76s   Alzheimer's disease Other    Colon polyps Neg Hx    Colon cancer Neg Hx    Liver disease Neg Hx     Review of  Systems  Constitutional:  Negative for appetite change, chills, fatigue, fever and unexpected weight change.  HENT:   Negative for hearing loss, lump/mass and trouble swallowing.   Eyes:  Negative for eye problems and icterus.  Respiratory:  Negative for chest tightness, cough and shortness of breath.   Cardiovascular:  Negative for chest pain, leg swelling and palpitations.  Gastrointestinal:  Negative for abdominal distention, abdominal pain, constipation, diarrhea, nausea and vomiting.  Endocrine: Negative for hot flashes.  Genitourinary:  Negative for difficulty urinating.   Musculoskeletal:  Negative for arthralgias.  Skin:  Negative for itching and rash.  Neurological:  Negative for dizziness, extremity weakness, headaches and numbness.  Hematological:  Negative for adenopathy. Does not bruise/bleed easily.  Psychiatric/Behavioral:  Negative for depression. The patient is not nervous/anxious.       PHYSICAL EXAMINATION  ECOG PERFORMANCE STATUS: 1 - Symptomatic but completely ambulatory  Vitals:   03/04/22 1149  BP: (!) 151/72  Pulse: 60  Resp: 16  Temp: 97.8 F (36.6 C)  SpO2: 99%    Physical Exam Constitutional:      General: She is not in acute distress.    Appearance: Normal appearance. She is not toxic-appearing.  HENT:     Head: Normocephalic and atraumatic.  Eyes:     General: No scleral icterus. Cardiovascular:     Rate and Rhythm: Normal rate and regular rhythm.     Pulses: Normal pulses.     Heart sounds: Normal heart sounds.  Pulmonary:     Effort: Pulmonary effort is normal.     Breath sounds: Normal breath sounds.  Chest:     Comments: The left breast has a small 0.5 to 1 cm nodule at 2:00 4 cm from the nipple there is an upper central seroma noted in the breast as well otherwise benign right breast is benign. Abdominal:     General: Abdomen is flat. Bowel sounds are normal. There is no distension.     Palpations: Abdomen is soft.     Tenderness:  There is no abdominal tenderness.  Musculoskeletal:        General: No swelling.     Cervical back: Neck supple.  Lymphadenopathy:     Cervical: No cervical adenopathy.  Skin:    General: Skin is warm and dry.     Findings: No rash.  Neurological:     General: No focal deficit present.     Mental Status: She is alert.  Psychiatric:        Mood and Affect: Mood normal.        Behavior: Behavior normal.     LABORATORY DATA:  None for this visit   ASSESSMENT and THERAPY PLAN:   Malignant neoplasm of upper-outer quadrant of left breast in female, estrogen receptor positive Loretta Nelson is a 57 year old woman with history of left-sided stage Ib triple negative breast cancer diagnosed in February 2021 status postlumpectomy, adjuvant chemotherapy, adjuvant radiation, and 2 years of antiestrogen therapy which was ultimately discontinued.  She has a BA or D1 genetic mutation.  I placed orders for diagnostic mammogram and ultrasound of the left breast to evaluate the left breast nodule we are palpating.  Considering scheduling delays in getting patients in with [facility] imaging I put the order in but also sent an email to [employer] who is over the breast center in order to expedite this considering the patient's genetic mutation and history of left breast cancer.  Loretta Nelson will let me know on Monday if she has not received her appointment date and time yet.  She has follow-up already scheduled with Dr. Lindi Adie in March 2024.  We will expedite that if needed.  All questions were answered. The patient knows to call the clinic with any problems, questions or concerns. We can certainly see the patient much sooner if necessary.  Total encounter time:20 minutes*in face-to-face visit time, chart review, lab review, care coordination, order entry, and documentation of the encounter time.    Mendel Ryder  Delice Bison, NP 03/04/22 1:28 PM Medical Oncology and Hematology [facility] [place], [place] [address] Tel. [phone]    Fax. [phone]  *Total Encounter Time as defined by the Centers for Medicare and Medicaid Services includes, in addition to the face-to-face time of a patient visit (documented in the note above) non-face-to-face time: obtaining and reviewing outside history, ordering and reviewing medications, tests or procedures, care coordination (communications with other health care professionals or caregivers) and documentation in the medical record.

## 2022-03-04 NOTE — Assessment & Plan Note (Signed)
Loretta Nelson is a 57 year old woman with history of left-sided stage Ib triple negative breast cancer diagnosed in February 2021 status postlumpectomy, adjuvant chemotherapy, adjuvant radiation, and 2 years of antiestrogen therapy which was ultimately discontinued.  She has a BA or D1 genetic mutation.  I placed orders for diagnostic mammogram and ultrasound of the left breast to evaluate the left breast nodule we are palpating.  Considering scheduling delays in getting patients in with [facility] imaging I put the order in but also sent an email to [employer] who is over the breast center in order to expedite this considering the patient's genetic mutation and history of left breast cancer.  Loretta Nelson will let me know on Monday if she has not received her appointment date and time yet.  She has follow-up already scheduled with Dr. Lindi Adie in March 2024.  We will expedite that if needed.

## 2022-03-06 ENCOUNTER — Other Ambulatory Visit: Payer: Self-pay | Admitting: Adult Health

## 2022-03-06 DIAGNOSIS — F331 Major depressive disorder, recurrent, moderate: Secondary | ICD-10-CM

## 2022-03-09 ENCOUNTER — Ambulatory Visit
Admission: RE | Admit: 2022-03-09 | Discharge: 2022-03-09 | Disposition: A | Discharge status: Home / Self Care | Payer: Commercial Managed Care - HMO | Source: Ambulatory Visit | Attending: Adult Health | Admitting: Adult Health

## 2022-03-09 DIAGNOSIS — N6321 Unspecified lump in the left breast, upper outer quadrant: Secondary | ICD-10-CM

## 2022-03-09 DIAGNOSIS — Z1501 Genetic susceptibility to malignant neoplasm of breast: Secondary | ICD-10-CM

## 2022-03-09 DIAGNOSIS — Z170 Estrogen receptor positive status [ER+]: Secondary | ICD-10-CM

## 2022-03-17 ENCOUNTER — Ambulatory Visit (INDEPENDENT_AMBULATORY_CARE_PROVIDER_SITE_OTHER): Payer: BC Managed Care – PPO | Admitting: Adult Health

## 2022-03-17 ENCOUNTER — Encounter: Payer: Self-pay | Admitting: Adult Health

## 2022-03-17 DIAGNOSIS — F410 Panic disorder [episodic paroxysmal anxiety] without agoraphobia: Secondary | ICD-10-CM | POA: Diagnosis not present

## 2022-03-17 DIAGNOSIS — F331 Major depressive disorder, recurrent, moderate: Secondary | ICD-10-CM

## 2022-03-17 DIAGNOSIS — F411 Generalized anxiety disorder: Secondary | ICD-10-CM

## 2022-03-17 DIAGNOSIS — F422 Mixed obsessional thoughts and acts: Secondary | ICD-10-CM

## 2022-03-17 DIAGNOSIS — G4700 Insomnia, unspecified: Secondary | ICD-10-CM | POA: Diagnosis not present

## 2022-03-17 NOTE — Progress Notes (Signed)
Loretta Nelson [id number] [birth year] 57 y.o.  Subjective:   Patient ID:  Loretta Nelson is a 57 y.o. (DOB Nov 24, 1965) female.  Chief Complaint: No chief complaint on file.   HPI Loretta Nelson presents to the office today for follow-up of MDD, GAD, panic attacks, insomnia, mixed obsessional thoughts.  Describes mood today as "ok". Pleasant. Tearful at times - "nothing abnormal". Mood symptoms - reports decreased depression, anxiety and irritability. Denies worry and rumination. Decreased intrusive thoughts. Denies recent panic attacks. Mood is more consistent. Stating "I'm feeling much better". Feels like medications are working well. Working with therapist. Seeing therapist Rinaldo Cloud. Stable interest and motivation. Taking medications as prescribed. Energy levels stable. Active, trying to exercise more. Enjoys some usual interests and activities. Divorced. Mother living with her since 50 - serves as her care giver. Has 2 daughters at home - college and graduate school. Recovering from breast cancer. Appetite adequate. Weight loss - 285 pounds.    Sleep has improved. Averages 7 to 8 hours with Trazadone.   Focus and concentration has been better. Managing some aspects of household. Recently resigned from [employer]. Denies SI or HI.  Denies AH or VH. Denies self harm. Denies substance use.  Previous medication trials: [place] Office Visit from 01/15/2019 in [facility]  Total Score (max 30 points ) 30        Review of Systems:  Review of Systems  Musculoskeletal:  Negative for gait problem.  Neurological:  Negative for tremors.  Psychiatric/Behavioral:         Please refer to HPI    Medications: I have reviewed the patient's current medications.  Current Outpatient Medications  Medication Sig Dispense Refill   acetaminophen (TYLENOL) 500 MG tablet Take 2 tablets (1,000 mg total) by mouth every 8 (eight) hours as  needed. 30 tablet 0   amLODipine (NORVASC) 5 MG tablet Take 5 mg by mouth daily.   5   Brexpiprazole (REXULTI) 0.5 MG TABS Take 1 tablet (0.5 mg total) by mouth daily. 30 tablet 2   buPROPion (WELLBUTRIN XL) 150 MG 24 hr tablet Take three tablets every morning. 270 tablet 1   Cholecalciferol 4000 UNITS CAPS Take 4,000 Units by mouth daily.      desvenlafaxine (PRISTIQ) 100 MG 24 hr tablet Take 1 tablet (100 mg total) by mouth daily. 90 tablet 1   diclofenac Sodium (VOLTAREN) 1 % GEL Apply 1 application topically 4 (four) times daily as needed (knee pain.).     EDARBYCLOR 40-25 MG TABS Take 1 tablet by mouth daily.  5   iron polysaccharides (NIFEREX) 150 MG capsule Take 150 mg by mouth daily.     levothyroxine (SYNTHROID) 175 MCG tablet Take 175 mcg by mouth daily before breakfast.   5   loratadine (CLARITIN) 10 MG tablet TAKE 1 TABLET BY MOUTH EVERY DAY 90 tablet 1   metoprolol (TOPROL-XL) 200 MG 24 hr tablet Take 200 mg by mouth daily.   5   pantoprazole (PROTONIX) 40 MG tablet Take 40 mg by mouth 2 (two) times daily.      potassium chloride (KLOR-CON) 20 MEQ packet Take 40 mEq by mouth daily.      traZODone (DESYREL) 50 MG tablet Take 0.5-1 tablets (25-50 mg total) by mouth at bedtime as needed. 90 tablet 1   No current facility-administered medications for this visit.    Medication Side Effects: None  Allergies:  Allergies  Allergen Reactions  Other     Other reaction(s): rash/red   Tape Itching and Other (See Comments)    Adhesive tape--redness/inflammation/itching    Past Medical History:  Diagnosis Date   Allergic rhinitis    BARD1 gene mutation positive 4/96/7591   Complication of anesthesia    woke up during EGD   Dysphagia    Empty sella syndrome    Essential hypertension    Family history of breast cancer    Fibroids, intramural 02/17/2017   Gastroesophageal reflux disease    Generalized anxiety disorder    Graves disease 01/13/11   radioactive iodine treatment,  63.8 millicuries   Hiatal hernia    small   History of chemotherapy    History of hysterectomy 02/17/2017   History of migraine headaches    History of radiation therapy    Iron deficiency anemia    Malignant neoplasm of upper-outer quadrant of left breast in female, 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   Please see review of systems for further details on the patient's review from today.   Objective:   Physical Exam:  LMP 02/13/2017 (Exact Date)   Physical Exam Constitutional:      General: She is not in acute distress. Musculoskeletal:        General: No deformity.  Neurological:     Mental Status: She is alert and oriented to person, place, and time.     Coordination: Coordination normal.  Psychiatric:        Attention and Perception: Attention and perception normal. She does not perceive auditory or visual hallucinations.        Mood and Affect: Mood normal. Mood is not anxious or depressed. Affect is not labile, blunt, angry or inappropriate.        Speech: Speech normal.        Behavior: Behavior normal.        Thought Content: Thought content normal. Thought content is not paranoid or delusional. Thought content does not include homicidal or suicidal ideation. Thought content does not include homicidal or suicidal plan.        Cognition and Memory: Cognition and memory normal.        Judgment: Judgment normal.     Comments: Insight intact     Lab Review:     Component Value Date/Time   NA 142 05/17/2021 1551   K 3.7 05/17/2021 1551   CL 103  05/17/2021 1551   CO2 33 (H) 05/17/2021 1551   GLUCOSE 97 05/17/2021 1551   BUN 14 05/17/2021 1551   CREATININE 0.84 05/17/2021 1551   CALCIUM 9.6 05/17/2021 1551   PROT 7.2 05/17/2021 1551   ALBUMIN 4.0 05/17/2021 1551   AST 18 05/17/2021 1551   ALT 16 05/17/2021 1551   ALKPHOS 75 05/17/2021 1551   BILITOT 0.3 05/17/2021 1551   GFRNONAA >60 05/17/2021 1551   GFRAA >60 10/24/2019 1323   GFRAA >60 04/10/2019 1240       Component Value Date/Time   WBC 6.2 05/17/2021 1551   WBC 5.3 11/23/2020 1140  RBC 4.28 05/17/2021 1551   HGB 11.5 (L) 05/17/2021 1551   HCT 36.1 05/17/2021 1551   PLT 187 05/17/2021 1551   MCV 84.3 05/17/2021 1551   MCH 26.9 05/17/2021 1551   MCHC 31.9 05/17/2021 1551   RDW 13.2 05/17/2021 1551   LYMPHSABS 1.8 05/17/2021 1551   MONOABS 0.5 05/17/2021 1551   EOSABS 0.3 05/17/2021 1551   BASOSABS 0.0 05/17/2021 1551    No results found for: "POCLITH", "LITHIUM"   No results found for: "PHENYTOIN", "PHENOBARB", "VALPROATE", "CBMZ"   .res Assessment: Plan:    Plan:  PDMP reviewed  Wellbutrin XL '150mg'$  - 3 daily - denies seizure history. Pristiq '100mg'$  daily Trazadone '50mg'$  at hs  Rexulti 0.'5mg'$  daily - samples given.  Therapist - Rinaldo Cloud  Time spent with patient was 25 minutes. Greater than 50% of face to face time with patient was spent on counseling and coordination of care.    RTC 3 months  Patient advised to contact office with any questions, adverse effects, or acute worsening in signs and symptoms.   Diagnoses and all orders for this visit:  Major depressive disorder, recurrent episode, moderate (HCC)  Generalized anxiety disorder  Panic attacks  Insomnia, unspecified type  Mixed obsessional thoughts and acts     Please see After Visit Summary for patient specific instructions.  Future Appointments  Date Time Provider [place]  05/24/2022  3:15 PM Nicholas Lose, MD [facility] None  01/16/2023  2:30 PM Lomax,  Amy, NP [facility] None    No orders of the defined types were placed in this encounter.   -------------------------------

## 2022-04-28 ENCOUNTER — Other Ambulatory Visit: Payer: Self-pay | Admitting: Hematology and Oncology

## 2022-04-28 DIAGNOSIS — Z1231 Encounter for screening mammogram for malignant neoplasm of breast: Secondary | ICD-10-CM

## 2022-05-04 ENCOUNTER — Telehealth: Payer: Self-pay | Admitting: Adult Health

## 2022-05-04 NOTE — Telephone Encounter (Signed)
Pt LVM @ 10:26a.  She wants to discuss side effects she is having.  Next appt 4/18

## 2022-05-04 NOTE — Telephone Encounter (Signed)
Patient said she had been on Rexulti 1 mg about a month and is noting increased appetite, binging, and weight gain.  She would like to taper off of it. She has been seeing Debbie in counseling and learning coping skills to handle stress better. She feels like her depression is controlled.

## 2022-05-04 NOTE — Telephone Encounter (Signed)
She can break the tablet in half for 7 days, then discontinue.

## 2022-05-04 NOTE — Telephone Encounter (Signed)
Patient notified of recommendations. 

## 2022-05-10 ENCOUNTER — Telehealth: Payer: Self-pay | Admitting: Family Medicine

## 2022-05-10 NOTE — Telephone Encounter (Signed)
Reached out to patient to reschedule appointment due to [facility] being out of office, patient aware of date and time change.

## 2022-05-24 ENCOUNTER — Ambulatory Visit: Payer: BC Managed Care – PPO | Admitting: Hematology and Oncology

## 2022-06-09 NOTE — Progress Notes (Signed)
Patient Care Team: Laurann MontanaWhite, Cynthia, MD as PCP - General (Family Medicine) Manus Ruddsuei, Matthew, MD as Consulting Physician (General Surgery) Dorothy PufferMoody, John, MD as Consulting Physician (Radiation Oncology) Talmage CoinKerr, Jeffrey, MD as Consulting Physician (Endocrinology) Huston FoleyAthar, Saima, MD as Consulting Physician (Neurology) Micki RileySethi, Pramod S, MD as Consulting Physician (Neurology) Gerald Leitzole, Tara, MD as Consulting Physician (Obstetrics and Gynecology) Willis Modenautlaw, William, MD as Consulting Physician (Gastroenterology)  DIAGNOSIS:  Encounter Diagnosis  Name Primary?   Malignant neoplasm of upper-outer quadrant of left breast in female, estrogen receptor positive Yes    SUMMARY OF ONCOLOGIC HISTORY: Oncology History  Malignant neoplasm of upper-outer quadrant of left breast in female, estrogen receptor positive  04/03/2019 Initial Diagnosis   04/03/2019: Left breast T1 BN 0 stage Ib grade 3 IDC functional triple negative, ER 10% weak, PR 0%, HER2 negative, Ki-67 30%    04/17/2019 Cancer Staging   Staging form: Breast, AJCC 8th Edition - Pathologic stage from 04/17/2019: Stage IB (pT1c, pN0, cM0, G3, ER-, PR-, HER2-) - Signed by Loa Socksausey, Lindsey Cornetto, NP on 05/01/2019   04/17/2019 Surgery   Left lumpectomy: T1 cN0 stage Ib grade 3 IDC triple negative with negative margins, 0/2 lymph nodes negative    04/19/2019 Genetic Testing   Likely pathogenic variant in BARD1 called c.2242G>T identified on the [facility] Breast Cancer STAT Panel + Common Hereditary Cancers Panel. The report date is 04/19/2019. Remainder of testing was normal.  Recommendation: Breast MRIs need to be done annually as well because of BARD 1 gene mutation.  Multiple family members have been affected with breast cancer.   The STAT Breast cancer panel offered by [facility] includes sequencing and rearrangement analysis for the following 9 genes:  ATM, BRCA1, BRCA2, CDH1, CHEK2, PALB2, PTEN, STK11 and TP53.    The Common Hereditary Cancers Panel offered by  [facility] includes sequencing and/or deletion duplication testing of the following 48 genes: APC, ATM, AXIN2, BARD1, BMPR1A, BRCA1, BRCA2, BRIP1, CDH1, CDKN2A (p14ARF), CDKN2A (p16INK4a), CKD4, CHEK2, CTNNA1, DICER1, EPCAM (Deletion/duplication testing only), GREM1 (promoter region deletion/duplication testing only), KIT, MEN1, MLH1, MSH2, MSH3, MSH6, MUTYH, NBN, NF1, NHTL1, PALB2, PDGFRA, PMS2, POLD1, POLE, PTEN, RAD50, RAD51C, RAD51D, RNF43, SDHB, SDHC, SDHD, SMAD4, SMARCA4. STK11, TP53, TSC1, TSC2, and VHL.  The following genes were evaluated for sequence changes only: SDHA and HOXB13 c.251G>A variant only.   05/23/2019 - 07/27/2019 Chemotherapy   Patient is on Treatment Plan : BREAST TC q21d     08/13/2019 - 09/27/2019 Radiation Therapy   The patient initially received a dose of 50.4 Gy in 28 fractions to the breast using whole-breast tangent fields. This was delivered using a 3-D conformal technique. The patient then received a boost to the seroma. This delivered an additional 10 Gy in 5 fractions using a 3-field photon boost technique. The total dose was 60.4 Gy.    10/24/2019 - 05/20/2021 Anti-estrogen oral therapy   Anastrozole started 10/24/2019 discontinued 05/20/2021 due to multiple side effects including hot flashes, joint stiffness, mood swings, worsening anxiety, weight gain etc. Because the final pathology was triple negative VG did not think she would benefit from antiestrogen therapy.     CHIEF COMPLIANT:  triple negative breast cancer surveillance  INTERVAL HISTORY: Loretta Nelson is a 57 yo with above mentioned triple negative breast cancer. She presents to the clinic today for a follow-up. She reports hs been pretty normal. She states that the pain in left breast comes and goes. She concerned that the seroma is still there. She does have a lot  of pain under her left axilla where the lymph node was removed. She says that the lymphedema is getting better. She is dealing with the brain fog  and the processing has became slow. She says it has affected her on her job. 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 She says it is better for health wise so she can work at her own pace. She is dealing with hearing and processing things at the same time. She is also dealing with anxiety and depression. She says she has lost at least 20 ponds. She does a lot of walking with her daughters, they started a walking program.  ALLERGIES:  is allergic to other and tape.  MEDICATIONS:  Current Outpatient Medications  Medication Sig Dispense Refill   acetaminophen (TYLENOL) 500 MG tablet Take 2 tablets (1,000 mg total) by mouth every 8 (eight) hours as needed. 30 tablet 0   amLODipine (NORVASC) 5 MG tablet Take 5 mg by mouth daily.   5   buPROPion (WELLBUTRIN XL) 150 MG 24 hr tablet Take three tablets every morning. 270 tablet 1   Cholecalciferol 4000 UNITS CAPS Take 4,000 Units by mouth daily.      desvenlafaxine (PRISTIQ) 100 MG 24 hr tablet Take 1 tablet (100 mg total) by mouth daily. 90 tablet 1   diclofenac Sodium (VOLTAREN) 1 % GEL Apply 1 application topically 4 (four) times daily as needed (knee pain.).     EDARBYCLOR 40-25 MG TABS Take 1 tablet by mouth daily.  5   iron polysaccharides (NIFEREX) 150 MG capsule Take 150 mg by mouth daily.     levothyroxine (SYNTHROID) 175 MCG tablet Take 175 mcg by mouth daily before breakfast.   5   loratadine (CLARITIN) 10 MG tablet TAKE 1 TABLET BY MOUTH EVERY DAY 90 tablet 1   metoprolol (TOPROL-XL) 200 MG 24 hr tablet Take 200 mg by mouth daily.   5   pantoprazole (PROTONIX) 40 MG tablet Take 40 mg by mouth 2 (two) times daily.      potassium chloride (KLOR-CON) 20 MEQ packet Take 40 mEq by mouth daily.      traZODone (DESYREL) 50 MG tablet Take 0.5-1 tablets (25-50 mg total) by mouth at bedtime as needed. 90 tablet 1   No current facility-administered medications for this visit.    PHYSICAL  EXAMINATION: ECOG PERFORMANCE STATUS: 1 - Symptomatic but completely ambulatory  Vitals:   06/10/22 1007  BP: (!) 148/63  Pulse: 71  Resp: 18  Temp: 97.8 F (36.6 C)  SpO2: 98%   Filed Weights   06/10/22 1007  Weight: 291 lb 9.6 oz (132.3 kg)    BREAST: No palpable masses or nodules in either right or left breasts.  Left breast seroma is palpable no palpable axillary supraclavicular or infraclavicular adenopathy no breast tenderness or nipple discharge. (exam performed in the presence of a chaperone)  LABORATORY DATA:  I have reviewed the data as listed    Latest Ref Rng & Units 05/17/2021    3:51 PM 11/23/2020   11:40 AM 06/11/2020   11:29 AM  CMP  Glucose 70 - 99 mg/dL 97  161  096   BUN 6 - 20 mg/dL Creatinine 0.44 - 1.00 mg/dL 0.45  4.09  8.11   Sodium 135 - 145 mmol/L 142  143  144   Potassium 3.5 - 5.1 mmol/L 3.7  3.8  3.4   Chloride  98 - 111 mmol/L 103  106  102   CO2 22 - 32 mmol/L 33  28  31   Calcium 8.9 - 10.3 mg/dL 9.6  9.8  9.4   Total Protein 6.5 - 8.1 g/dL 7.2  7.5  7.4   Total Bilirubin 0.3 - 1.2 mg/dL 0.3  0.3  0.4   Alkaline Phos 38 - 126 U/L 75  83  96   AST 15 - 41 U/L 18  19  17    ALT 0 - 44 U/L 16  17  16      Lab Results  Component Value Date   WBC 6.2 05/17/2021   HGB 11.5 (L) 05/17/2021   HCT 36.1 05/17/2021   MCV 84.3 05/17/2021   PLT 187 05/17/2021   NEUTROABS 3.6 05/17/2021    ASSESSMENT & PLAN:  Malignant neoplasm of upper-outer quadrant of left breast in female, estrogen receptor positive 04/03/2019: Left breast T1 BN 0 stage Ib grade 3 IDC functional triple negative, ER 10% weak, PR 0%, HER2 negative, Ki-67 30% 04/17/2019: Left lumpectomy: T1 cN0 stage Ib grade 3 IDC triple negative with negative margins, 0/2 lymph nodes negative 08/13/2019-09/27/2019: Adjuvant radiation Genetics: Pathogenic variant in Bard 1   Current treatment: Anastrozole started 10/24/2019 discontinued 05/20/2021 due to multiple side effects including hot  flashes, joint stiffness, mood swings, worsening anxiety, weight gain etc. Because the final pathology was triple negative I did not think she would benefit from antiestrogen therapy.   Breast cancer surveillance: 1.  Breast exam 06/10/2022: Benign 2. mammogram scheduled for 06/15/2022  3.  Breast MRIs need to be done annually as well because of BARD 1 gene mutation.  Multiple family members have been affected with breast cancer.  11/04/2021: Benign breast density category A We will order a breast MRI and after that we can do the MRIs every other year.  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    Answer:   No Sedation    Order Specific Question:   Does the patient have a pacemaker or implanted devices?    Answer:   No    Order Specific Question:   Preferred imaging location?    Answer:   [facility] (table limit-550lbs)   The patient has a good understanding of the overall plan. she agrees with it. she will call with any problems that may develop before the next visit here. Total time spent: 30 mins including face to face time and time spent for planning, charting and co-ordination of care   Tamsen Meek, MD 06/10/22    I Janan Ridge am acting as a Neurosurgeon for [employer]  I have reviewed the above documentation for accuracy and completeness, and I agree with the above.

## 2022-06-10 ENCOUNTER — Inpatient Hospital Stay: Payer: Commercial Managed Care - HMO | Attending: Adult Health | Admitting: Hematology and Oncology

## 2022-06-10 ENCOUNTER — Other Ambulatory Visit: Payer: Self-pay

## 2022-06-10 VITALS — BP 148/63 | HR 71 | Temp 97.8°F | Resp 18 | Ht 65.0 in | Wt 291.6 lb

## 2022-06-10 DIAGNOSIS — Z08 Encounter for follow-up examination after completed treatment for malignant neoplasm: Secondary | ICD-10-CM | POA: Diagnosis present

## 2022-06-10 DIAGNOSIS — F32A Depression, unspecified: Secondary | ICD-10-CM | POA: Insufficient documentation

## 2022-06-10 DIAGNOSIS — C50412 Malignant neoplasm of upper-outer quadrant of left female breast: Secondary | ICD-10-CM

## 2022-06-10 DIAGNOSIS — Z853 Personal history of malignant neoplasm of breast: Secondary | ICD-10-CM | POA: Diagnosis present

## 2022-06-10 DIAGNOSIS — Z170 Estrogen receptor positive status [ER+]: Secondary | ICD-10-CM

## 2022-06-10 NOTE — Assessment & Plan Note (Addendum)
04/03/2019: Left breast T1 BN 0 stage Ib grade 3 IDC functional triple negative, ER 10% weak, PR 0%, HER2 negative, Ki-67 30% 04/17/2019: Left lumpectomy: T1 cN0 stage Ib grade 3 IDC triple negative with negative margins, 0/2 lymph nodes negative 08/13/2019-09/27/2019: Adjuvant radiation Genetics: Pathogenic variant in Bard 1   Current treatment: Anastrozole started 10/24/2019 discontinued 05/20/2021 due to multiple side effects including hot flashes, joint stiffness, mood swings, worsening anxiety, weight gain etc. Because the final pathology was triple negative I did not think she would benefit from antiestrogen therapy.   Breast cancer surveillance: 1.  Breast exam 06/10/2022: Benign 2. mammogram scheduled for 06/15/2022  3.  Breast MRIs need to be done annually as well because of BARD 1 gene mutation.  Multiple family members have been affected with breast cancer.  11/04/2021: Benign breast density category A We will order a breast MRI and after that we can do the MRIs every other year.  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

## 2022-06-13 ENCOUNTER — Telehealth: Payer: Self-pay | Admitting: Hematology and Oncology

## 2022-06-13 NOTE — Telephone Encounter (Signed)
Scheduled appointment per 4/12 los. Patient is aware of the made appointment. 

## 2022-06-15 ENCOUNTER — Ambulatory Visit
Admission: RE | Admit: 2022-06-15 | Discharge: 2022-06-15 | Disposition: A | Discharge status: Home / Self Care | Payer: Commercial Managed Care - HMO | Source: Ambulatory Visit | Attending: Hematology and Oncology | Admitting: Hematology and Oncology

## 2022-06-15 DIAGNOSIS — Z1231 Encounter for screening mammogram for malignant neoplasm of breast: Secondary | ICD-10-CM

## 2022-06-16 ENCOUNTER — Ambulatory Visit: Payer: Commercial Managed Care - HMO | Admitting: Adult Health

## 2022-06-16 ENCOUNTER — Encounter: Payer: Self-pay | Admitting: Adult Health

## 2022-06-16 DIAGNOSIS — F410 Panic disorder [episodic paroxysmal anxiety] without agoraphobia: Secondary | ICD-10-CM | POA: Diagnosis not present

## 2022-06-16 DIAGNOSIS — F411 Generalized anxiety disorder: Secondary | ICD-10-CM | POA: Diagnosis not present

## 2022-06-16 DIAGNOSIS — F422 Mixed obsessional thoughts and acts: Secondary | ICD-10-CM

## 2022-06-16 DIAGNOSIS — F331 Major depressive disorder, recurrent, moderate: Secondary | ICD-10-CM | POA: Diagnosis not present

## 2022-06-16 DIAGNOSIS — G4700 Insomnia, unspecified: Secondary | ICD-10-CM

## 2022-06-16 MED ORDER — GABAPENTIN 100 MG PO CAPS: 100.0000 mg | ORAL_CAPSULE | Freq: Every day | ORAL | 2 refills | Status: DC

## 2022-06-16 MED ORDER — BUPROPION HCL ER (XL) 150 MG PO TB24: ORAL_TABLET | ORAL | 1 refills | Status: DC

## 2022-06-16 MED ORDER — DESVENLAFAXINE SUCCINATE ER 100 MG PO TB24: 100.0000 mg | ORAL_TABLET | Freq: Every day | ORAL | 1 refills | Status: DC

## 2022-06-16 NOTE — Progress Notes (Signed)
Loretta Nelson [id number] January 23, 1966 57 y.o.  Subjective:   Patient ID:  Loretta Nelson is a 58 y.o. (DOB 20-Jun-1965) female.  Chief Complaint: No chief complaint on file.   HPI Loretta Nelson presents to the office today for follow-up of MDD, GAD, panic attacks, insomnia, mixed obsessional thoughts.  Describes mood today as "ok". Pleasant. Tearful at times. Mood symptoms - reports decreased depression, anxiety and irritability. Denies worry, rumination, and over thinking - "not allowing it to control me". Decreased intrusive thoughts. Denies recent panic attacks. Mood is more consistent. Stating "I'm doing alright". Feels like medications are working well. Stable interest and motivation. Taking medications as prescribed. Energy levels stable. Active, trying to exercise more. Enjoys some usual interests and activities. Divorced. Mother living with her since 58 - serves as her care giver. Has 2 daughters at home - college and graduate school.  Appetite adequate. Weight - 285 pounds.    Sleeping better some nights than others - leg pain. Averages 7 to 8 hours with Trazadone.   Focus and concentration has been better. Managing some aspects of household. 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  Denies AH or VH. Denies self harm. Denies substance use.  Previous medication trials: Celexa   Mini-Mental    Flowsheet Row Office Visit from 01/15/2019 in [facility]  Total Score (max 30 points ) 30        Review of Systems:  Review of Systems  Musculoskeletal:  Negative for gait problem.  Neurological:  Negative for tremors.  Psychiatric/Behavioral:         Please refer to HPI    Medications: I have reviewed the patient's current medications.  Current Outpatient Medications  Medication Sig Dispense Refill   gabapentin (NEURONTIN) 100 MG capsule Take 1 capsule (100 mg total) by mouth at bedtime. 30 capsule 2   acetaminophen (TYLENOL) 500 MG tablet  Take 2 tablets (1,000 mg total) by mouth every 8 (eight) hours as needed. 30 tablet 0   amLODipine (NORVASC) 5 MG tablet Take 5 mg by mouth daily.   5   buPROPion (WELLBUTRIN XL) 150 MG 24 hr tablet Take three tablets every morning. 270 tablet 1   Cholecalciferol 4000 UNITS CAPS Take 4,000 Units by mouth daily.      desvenlafaxine (PRISTIQ) 100 MG 24 hr tablet Take 1 tablet (100 mg total) by mouth daily. 90 tablet 1   diclofenac Sodium (VOLTAREN) 1 % GEL Apply 1 application topically 4 (four) times daily as needed (knee pain.).     EDARBYCLOR 40-25 MG TABS Take 1 tablet by mouth daily.  5   iron polysaccharides (NIFEREX) 150 MG capsule Take 150 mg by mouth daily.     levothyroxine (SYNTHROID) 175 MCG tablet Take 175 mcg by mouth daily before breakfast.   5   loratadine (CLARITIN) 10 MG tablet TAKE 1 TABLET BY MOUTH EVERY DAY 90 tablet 1   metoprolol (TOPROL-XL) 200 MG 24 hr tablet Take 200 mg by mouth daily.   5   pantoprazole (PROTONIX) 40 MG tablet Take 40 mg by mouth 2 (two) times daily.      potassium chloride (KLOR-CON) 20 MEQ packet Take 40 mEq by mouth daily.      traZODone (DESYREL) 50 MG tablet Take 0.5-1 tablets (25-50 mg total) by mouth at bedtime as needed. 90 tablet 1   No current facility-administered medications for this visit.    Medication Side Effects: None  Allergies:  Allergies  Allergen  Reactions   Other     Other reaction(s): rash/red   Tape Itching and Other (See Comments)    Adhesive tape--redness/inflammation/itching    Past Medical History:  Diagnosis Date   Allergic rhinitis    BARD1 gene mutation positive 04/19/2019   Complication of anesthesia    woke up during EGD   Dysphagia    Empty sella syndrome    Essential hypertension    Family history of breast cancer    Fibroids, intramural 02/17/2017   Gastroesophageal reflux disease    Generalized anxiety disorder    Graves disease 01/13/11   radioactive iodine treatment, 14.6 millicuries   Hiatal  hernia    small   History of chemotherapy    History of hysterectomy 02/17/2017   History of migraine headaches    History of radiation therapy    Iron deficiency anemia    Malignant neoplasm of upper-outer quadrant of left breast in female, 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   Please see review of systems for further details on the patient's review from today.   Objective:   Physical Exam:  LMP 02/13/2017 (Exact Date)   Physical Exam Constitutional:      General: She is not in acute distress. Musculoskeletal:        General: No deformity.  Neurological:     Mental Status: She is alert and oriented to person, place, and time.     Coordination: Coordination normal.  Psychiatric:        Attention and Perception: Attention and perception normal. She does not perceive auditory or visual hallucinations.        Mood and Affect: Mood normal. Mood is not anxious or depressed. Affect is not labile, blunt, angry or inappropriate.        Speech: Speech normal.        Behavior: Behavior normal.        Thought Content: Thought content normal. Thought content is not paranoid or delusional. Thought content does not include homicidal or suicidal ideation. Thought content does not include homicidal or suicidal plan.        Cognition and Memory: Cognition and memory normal.        Judgment: Judgment normal.     Comments: Insight intact     Lab Review:     Component Value Date/Time   NA 142 05/17/2021 1551   K 3.7 05/17/2021 1551   CL 103 05/17/2021 1551   CO2 33 (H)  05/17/2021 1551   GLUCOSE 97 05/17/2021 1551   BUN 14 05/17/2021 1551   CREATININE 0.84 05/17/2021 1551   CALCIUM 9.6 05/17/2021 1551   PROT 7.2 05/17/2021 1551   ALBUMIN 4.0 05/17/2021 1551   AST 18 05/17/2021 1551   ALT 16 05/17/2021 1551   ALKPHOS 75 05/17/2021 1551   BILITOT 0.3 05/17/2021 1551   GFRNONAA >60 05/17/2021 1551   GFRAA >60 10/24/2019 1323   GFRAA >60 04/10/2019 1240       Component Value Date/Time   WBC 6.2 05/17/2021 1551   WBC  5.3 11/23/2020 1140   RBC 4.28 05/17/2021 1551   HGB 11.5 (L) 05/17/2021 1551   HCT 36.1 05/17/2021 1551   PLT 187 05/17/2021 1551   MCV 84.3 05/17/2021 1551   MCH 26.9 05/17/2021 1551   MCHC 31.9 05/17/2021 1551   RDW 13.2 05/17/2021 1551   LYMPHSABS 1.8 05/17/2021 1551   MONOABS 0.5 05/17/2021 1551   EOSABS 0.3 05/17/2021 1551   BASOSABS 0.0 05/17/2021 1551    No results found for: "POCLITH", "LITHIUM"   No results found for: "PHENYTOIN", "PHENOBARB", "VALPROATE", "CBMZ"   .res Assessment: Plan:    Plan:  PDMP reviewed  Wellbutrin XL  - 3 daily - denies seizure history. Pristiq  daily Trazadone  at hs  Add Gabapentin  at hs for sleep.  Therapist - Rockne Menghini - seeing as needed  Time spent with patient was 25 minutes. Greater than 50% of face to face time with patient was spent on counseling and coordination of care.    RTC 6 months  Patient advised to contact office with any questions, adverse effects, or acute worsening in signs and symptoms.   Diagnoses and all orders for this visit:  Major depressive disorder, recurrent episode, moderate -     buPROPion (WELLBUTRIN XL) 150 MG 24 hr tablet; Take three tablets every morning. -     desvenlafaxine (PRISTIQ) 100 MG 24 hr tablet; Take 1 tablet (100 mg total) by mouth daily.  Generalized anxiety disorder -     gabapentin (NEURONTIN) 100 MG capsule; Take 1 capsule (100 mg total) by mouth at bedtime.  Panic attacks  Mixed obsessional  thoughts and acts  Insomnia, unspecified type -     gabapentin (NEURONTIN) 100 MG capsule; Take 1 capsule (100 mg total) by mouth at bedtime.     Please see After Visit Summary for patient specific instructions.  Future Appointments  Date Time Provider Department Center  12/09/2022 10:50 AM [facility] MR 1 [facility]  01/16/2023  2:30 PM Lomax, Amy, NP [facility] None  06/12/2023 10:00 AM Serena Croissant, MD [facility] None    No orders of the defined types were placed in this encounter.   -------------------------------

## 2022-07-23 ENCOUNTER — Other Ambulatory Visit: Payer: Self-pay | Admitting: Adult Health

## 2022-07-23 DIAGNOSIS — F331 Major depressive disorder, recurrent, moderate: Secondary | ICD-10-CM

## 2022-10-17 ENCOUNTER — Institutional Professional Consult (permissible substitution): Payer: Self-pay | Admitting: Neurology

## 2022-10-17 ENCOUNTER — Telehealth: Payer: Self-pay | Admitting: Neurology

## 2022-10-17 NOTE — Telephone Encounter (Signed)
LVM and sent mychart message informing pt of 8/19 appt cancellation- MD out of office.

## 2022-10-20 ENCOUNTER — Encounter: Payer: Self-pay | Admitting: Neurology

## 2022-10-20 ENCOUNTER — Ambulatory Visit: Payer: Commercial Managed Care - HMO | Admitting: Neurology

## 2022-10-20 VITALS — BP 146/76 | HR 62 | Ht 64.0 in | Wt 287.0 lb

## 2022-10-20 DIAGNOSIS — G5711 Meralgia paresthetica, right lower limb: Secondary | ICD-10-CM

## 2022-10-20 DIAGNOSIS — R202 Paresthesia of skin: Secondary | ICD-10-CM | POA: Diagnosis not present

## 2022-10-20 DIAGNOSIS — G3184 Mild cognitive impairment, so stated: Secondary | ICD-10-CM

## 2022-10-20 DIAGNOSIS — M79609 Pain in unspecified limb: Secondary | ICD-10-CM | POA: Diagnosis not present

## 2022-10-20 DIAGNOSIS — R413 Other amnesia: Secondary | ICD-10-CM

## 2022-10-20 MED ORDER — TOPIRAMATE 50 MG PO TABS: 50.0000 mg | ORAL_TABLET | Freq: Two times a day (BID) | ORAL | 3 refills | Status: DC

## 2022-10-20 NOTE — Progress Notes (Signed)
[facility] [address] [place]. [place] [address] [phone]       OFFICE CONSULT NOTE  [name] Date of Birth:  10-09-1965 Medical Record Number:  [id number]   Referring MD:  Laurann Montana  Reason for Referral:  meralgia paresthetica  HPI: Loretta Nelson is a 57 year old pleasant African-American lady seen today for office consultation visit for right thigh pain and paresthesias.  History is obtained from the patient and review of electronic medical records.  I personally reviewed pertinent available imaging films in PACS.  She has past medical history of malignant left breast carcinoma s/p chemotherapy, radiation and hormonal treatment but in remission for the last 2 years essential hypertension, iron deficiency anemia, fibroids.,  Graves' disease, obesity and mild cognitive impairment.  Patient states for the last 1 year or so she has had initially intermittent and now constant pain in the right anterior and lateral thigh.  It is burning at times and occasionally sharp.  The pain has become more severe and she has sensitivity to touch in that region.  She cannot get comfortable sleeping on her right side or on her back and has to sleep on the left.  She has tried taking gabapentin 100 mg at night for the last few months but it has not helped much.  She denies any back pain radicular pain.  The pain and discomfort does not extend below her knee.  She continues to have mild short-term memory difficulties for which she saw me 4 years ago which are stable and unchanged.  She states she did have an MRI scan of the lumbar spine done on 11/04/2021 which showed mild spondylitic changes only.  Patient informs me that she was not felt to be a surgical candidate.  She has obstructive sleep apnea and sees Dr. Frances Furbish in our office.  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   Complication of anesthesia    woke up during EGD   Dysphagia    Empty sella syndrome    Essential hypertension    Family history of breast cancer    Fibroids, intramural 02/17/2017   Gastroesophageal reflux disease    Generalized anxiety disorder    Graves disease 01/13/11   radioactive iodine treatment, 14.6 millicuries   Hiatal hernia    small   History of chemotherapy    History of hysterectomy 02/17/2017   History of migraine headaches    History of radiation therapy    Iron deficiency anemia    Malignant neoplasm of upper-outer quadrant of left breast in female, 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   Meralgia paresthetica    Murmur    benign, h/o, caused by hyperdynamic contraction   Obesity    Obstructive sleep apnea    Pneumonia 2017   inhaler prescribed   Postoperative hypothyroidism    Primary osteoarthritis of right foot 03/12/2018   Tendonitis of shoulder, right    Vitamin D deficiency     Social History:  Social History   Socioeconomic History   Marital status: Divorced    Spouse name: Not on file   Number of children: Not on file   Years of education: 18   Highest education level: Master's degree (e.g., MA, MS, MEng, MEd, MSW, MBA)  Occupational History  Not on file  Tobacco Use   Smoking status: Never   Smokeless tobacco: Never  Vaping Use   Vaping status: Never Used  Substance and Sexual Activity   Alcohol use: No   Drug use: No   Sexual activity: Not Currently    Birth control/protection: Abstinence  Other Topics Concern   Not on file  Social History Narrative   Not on file   Social Determinants of Health   Financial Resource Strain: Not on file  Food Insecurity: Not on file  Transportation Needs: Not on file  Physical Activity: Not on file  Stress: Not on file  Social Connections: Unknown (07/09/2021)   [facility]   Social  Network    Social Network: Not on file  Intimate Partner Violence: Unknown (05/31/2021)   Received from [facility]   HITS    Physically Hurt: Not on file    Insult or Talk Down To: Not on file    Threaten Physical Harm: Not on file    Scream or Curse: Not on file    Medications:   Current Outpatient Medications on File Prior to Visit  Medication Sig Dispense Refill   acetaminophen (TYLENOL) 500 MG tablet Take 2 tablets (1,000 mg total) by mouth every 8 (eight) hours as needed. 30 tablet 0   amLODipine (NORVASC) 5 MG tablet Take 5 mg by mouth daily.   5   buPROPion (WELLBUTRIN XL) 150 MG 24 hr tablet Take three tablets every morning. 270 tablet 1   Cholecalciferol 4000 UNITS CAPS Take 4,000 Units by mouth daily.      desvenlafaxine (PRISTIQ) 100 MG 24 hr tablet Take 1 tablet (100 mg total) by mouth daily. 90 tablet 1   diclofenac Sodium (VOLTAREN) 1 % GEL Apply 1 application topically 4 (four) times daily as needed (knee pain.).     EDARBYCLOR 40-25 MG TABS Take 1 tablet by mouth daily.  5   gabapentin (NEURONTIN) 100 MG capsule Take 1 capsule (100 mg total) by mouth at bedtime. 30 capsule 2   iron polysaccharides (NIFEREX) 150 MG capsule Take 150 mg by mouth daily.     levothyroxine (SYNTHROID) 175 MCG tablet Take 175 mcg by mouth daily before breakfast.   5   loratadine (CLARITIN) 10 MG tablet TAKE 1 TABLET BY MOUTH EVERY DAY 90 tablet 1   metoprolol (TOPROL-XL) 200 MG 24 hr tablet Take 200 mg by mouth daily.   5   pantoprazole (PROTONIX) 40 MG tablet Take 40 mg by mouth 2 (two) times daily.      potassium chloride (KLOR-CON) 20 MEQ packet Take 40 mEq by mouth daily.      traZODone (DESYREL) 50 MG tablet TAKE 0.5-1 TABLETS (25-50 MG TOTAL) BY MOUTH AT BEDTIME AS NEEDED. 90 tablet 1   No current facility-administered medications on file prior to visit.    Allergies:   Allergies  Allergen Reactions   Other     Other reaction(s): rash/red   Tape Itching and Other (See Comments)     Adhesive tape--redness/inflammation/itching    Physical Exam General: obese middle ages african american lady, seated, in no evident distress Head: head normocephalic and atraumatic.   Neck: supple with no carotid or supraclavicular bruits Cardiovascular: regular rate and rhythm, no murmurs Musculoskeletal: no deformity Skin:  no rash/petichiae Vascular:  Normal pulses all extremities  Neurologic Exam Mental Status: Awake and fully alert. Oriented to place and time. Recent and remote memory intact. Attention span, concentration and fund  of knowledge appropriate. Mood and affect appropriate.  Cranial Nerves: Fundoscopic exam reveals sharp disc margins. Pupils equal, briskly reactive to light. Extraocular movements full without nystagmus. Visual fields full to confrontation. Hearing intact. Facial sensation intact. Face, tongue, palate moves normally and symmetrically.  Motor: Normal bulk and tone. Normal strength in all tested extremity muscles. Sensory.: intact to touch , pinprick , position and vibratory sensation but hyperesthesia on right anterior and lateral thigh only.  Coordination: Rapid alternating movements normal in all extremities. Finger-to-nose and heel-to-shin performed accurately bilaterally. Gait and Station: Arises from chair without difficulty. Stance is normal. Gait demonstrates normal stride length and balance . Able to heel, toe and tandem walk with difficulty.  Reflexes: 1+ and symmetric. Toes downgoing.      ASSESSMENT: 57 year old African-American lady with 1 year history of right anterior and lateral thigh pain and paresthesias likely from meralgia paresthetica.  She also has short-term memory difficulties due to mild cognitive impairment which appears stable.Marland Kitchen     PLAN:I had a long discussion with the patient regarding her pain and paresthesias in the right anterior and lateral thigh likely from meralgia paresthetica.  I recommend she discontinue gabapentin  as she is on a very low dose and not helping instead tried Topamax 50 mg twice daily to be increased as tolerated.  I discussed possible side effects with the patient and advised her to call me if needed.  She was also encouraged to eat healthy and exercise regularly and lose weight.  We also discussed her mild cognitive impairment and I recommend she increase participation in cognitively challenging activities like solving crossword puzzles, playing bridge and sudoku.  We also discussed memory compensation strategies.  Advised her to use her CPAP every night and continue follow-up for sleep apnea with Dr. Frances Furbish. Return for follow-up in the future in 3 months with my nurse practitioner or call earlier if necessary.  Delia Heady, MD Note: This document was prepared with digital dictation and possible smart phrase technology. Any transcriptional errors that result from this process are unintentional.

## 2022-10-20 NOTE — Patient Instructions (Signed)
I had a long discussion with the patient regarding her pain and paresthesias in the right anterior and lateral thigh likely from meralgia paresthetica.  I recommend she discontinue gabapentin as she is on a very low dose and not helping instead tried Topamax 50 mg twice daily to be increased as tolerated.  I discussed possible side effects with the patient and advised her to call me if needed.  She was also encouraged to eat healthy and exercise regularly and lose weight.  We also discussed her mild cognitive impairment and I recommend she increase participation in cognitively challenging activities like solving crossword puzzles, playing bridge and sudoku.  We also discussed memory compensation strategies.  Advised her to use her CPAP every night and continue follow-up for sleep apnea with Dr. Frances Furbish. Return for follow-up in the future in 3 months with my nurse practitioner or call earlier if necessary.  Memory Compensation Strategies  Use "WARM" strategy.  W= write it down  A= associate it  R= repeat it  M= make a mental note  2.   You can keep a Glass blower/designer.  Use a 3-ring notebook with sections for the following: calendar, important names and phone numbers,  medications, doctors' names/phone numbers, lists/reminders, and a section to journal what you did  each day.   3.    Use a calendar to write appointments down.  4.    Write yourself a schedule for the day.  This can be placed on the calendar or in a separate section of the Memory Notebook.  Keeping a  regular schedule can help memory.  5.    Use medication organizer with sections for each day or morning/evening pills.  You may need help loading it  6.    Keep a basket, or pegboard by the door.  Place items that you need to take out with you in the basket or on the pegboard.  You may also want to  include a message board for reminders.  7.    Use sticky notes.  Place sticky notes with reminders in a place where the task is  performed.  For example: " turn off the  stove" placed by the stove, "lock the door" placed on the door at eye level, " take your medications" on  the bathroom mirror or by the place where you normally take your medications.  8.    Use alarms/timers.  Use while cooking to remind yourself to check on food or as a reminder to take your medicine, or as a  reminder to make a call, or as a reminder to perform another task, etc.

## 2022-11-03 ENCOUNTER — Other Ambulatory Visit: Payer: Self-pay | Admitting: Adult Health

## 2022-11-03 DIAGNOSIS — F331 Major depressive disorder, recurrent, moderate: Secondary | ICD-10-CM

## 2022-12-01 ENCOUNTER — Encounter: Payer: Self-pay | Admitting: Hematology and Oncology

## 2022-12-02 ENCOUNTER — Encounter: Payer: Self-pay | Admitting: Oncology

## 2022-12-09 ENCOUNTER — Ambulatory Visit
Admission: RE | Admit: 2022-12-09 | Discharge: 2022-12-09 | Disposition: A | Discharge status: Home / Self Care | Payer: Managed Care, Other (non HMO) | Source: Ambulatory Visit | Attending: Hematology and Oncology | Admitting: Hematology and Oncology

## 2022-12-09 ENCOUNTER — Encounter: Payer: Self-pay | Admitting: Oncology

## 2022-12-09 DIAGNOSIS — Z170 Estrogen receptor positive status [ER+]: Secondary | ICD-10-CM

## 2022-12-09 MED ORDER — GADOPICLENOL 0.5 MMOL/ML IV SOLN
10.0000 mL | Freq: Once | INTRAVENOUS | Status: AC | PRN
  Administered 2022-12-09: 10 mL via INTRAVENOUS

## 2022-12-11 ENCOUNTER — Other Ambulatory Visit: Payer: Self-pay | Admitting: Adult Health

## 2022-12-11 DIAGNOSIS — F331 Major depressive disorder, recurrent, moderate: Secondary | ICD-10-CM

## 2022-12-12 ENCOUNTER — Telehealth: Payer: Self-pay

## 2022-12-12 NOTE — Telephone Encounter (Signed)
-----   Message from Noreene Filbert sent at 12/12/2022  8:36 AM EDT ----- Breast MRI is negative, please give patient the good news ----- Message ----- From: Interface, Rad Results In Sent: 12/09/2022   1:52 PM EDT To: Serena Croissant, MD

## 2022-12-12 NOTE — Telephone Encounter (Signed)
Called pt per NP to give results left voicemail for pt. call back to the office to get results.

## 2022-12-12 NOTE — Telephone Encounter (Signed)
Per Np called pt. to give Mri results. Got pt vm. Advise pt to call back and asked to peak to [facility] nurse for results.

## 2022-12-16 ENCOUNTER — Ambulatory Visit: Payer: Commercial Managed Care - HMO | Admitting: Adult Health

## 2022-12-23 ENCOUNTER — Encounter: Payer: Self-pay | Admitting: Adult Health

## 2022-12-23 ENCOUNTER — Telehealth: Payer: Commercial Managed Care - HMO | Admitting: Adult Health

## 2022-12-23 DIAGNOSIS — G4700 Insomnia, unspecified: Secondary | ICD-10-CM

## 2022-12-23 DIAGNOSIS — F410 Panic disorder [episodic paroxysmal anxiety] without agoraphobia: Secondary | ICD-10-CM | POA: Diagnosis not present

## 2022-12-23 DIAGNOSIS — F331 Major depressive disorder, recurrent, moderate: Secondary | ICD-10-CM

## 2022-12-23 DIAGNOSIS — F422 Mixed obsessional thoughts and acts: Secondary | ICD-10-CM

## 2022-12-23 DIAGNOSIS — F411 Generalized anxiety disorder: Secondary | ICD-10-CM | POA: Diagnosis not present

## 2022-12-23 MED ORDER — BUPROPION HCL ER (XL) 150 MG PO TB24: ORAL_TABLET | ORAL | 1 refills | Status: DC

## 2022-12-23 MED ORDER — TRAZODONE HCL 50 MG PO TABS: 25.0000 mg | ORAL_TABLET | Freq: Every evening | ORAL | 1 refills | Status: DC | PRN

## 2022-12-23 MED ORDER — DESVENLAFAXINE SUCCINATE ER 100 MG PO TB24: 100.0000 mg | ORAL_TABLET | Freq: Every day | ORAL | 1 refills | Status: DC

## 2022-12-23 NOTE — Progress Notes (Signed)
Loretta Nelson [id number] [birth year] 57 y.o.  Subjective:   Patient ID:  Loretta Nelson is a 57 y.o. (DOB May 12, 1965) female.  Chief Complaint: No chief complaint on file.   HPI Fairie Birdsong Givhan presents to the office today for follow-up of MDD, GAD, panic attacks, insomnia, mixed obsessional thoughts.  Describes mood today as "ok". Pleasant. Tearful at times. Mood symptoms - reports decreased depression, anxiety and irritability - "I have my moments here and there - triggers". Denies recent panic attacks. Denies worry, rumination, and over thinking - "a little bit, but nothing major". Decreased intrusive thoughts. Mood is stable. Stating "overall, I'm doing well". Feels like medications are working well. Stable interest and motivation. Taking medications as prescribed. Energy levels stable. Active, trying to exercise more. Enjoys some usual interests and activities. Divorced. Mother living with her since 58 - serves as her care giver. Has 2 daughters at home. Appetite adequate. Weight loss - 40 pounds - 285 pounds.    Sleeping well most nights. Averages 7 to 8 hours with Trazadone.   Focus and concentration has been better. Managing some aspects of household. 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  Denies AH or VH. Denies self harm. Denies substance use.  Previous medication trials: Celexa   Mini-Mental    Flowsheet Row Office Visit from 01/15/2019 in [facility]  Total Score (max 30 points ) 30        Review of Systems:  Review of Systems  Musculoskeletal:  Negative for gait problem.  Neurological:  Negative for tremors.  Psychiatric/Behavioral:         Please refer to HPI    Medications: I have reviewed the patient's current medications.  Current Outpatient Medications  Medication Sig Dispense Refill   buPROPion (WELLBUTRIN XL) 150 MG 24 hr tablet TAKE THREE TABLETS EVERY MORNING. 270 tablet 0   desvenlafaxine (PRISTIQ) 100 MG 24 hr  tablet TAKE 1 TABLET BY MOUTH EVERY DAY 30 tablet 0   acetaminophen (TYLENOL) 500 MG tablet Take 2 tablets (1,000 mg total) by mouth every 8 (eight) hours as needed. 30 tablet 0   amLODipine (NORVASC) 5 MG tablet Take 5 mg by mouth daily.   5   Cholecalciferol 4000 UNITS CAPS Take 4,000 Units by mouth daily.      diclofenac Sodium (VOLTAREN) 1 % GEL Apply 1 application topically 4 (four) times daily as needed (knee pain.).     EDARBYCLOR 40-25 MG TABS Take 1 tablet by mouth daily.  5   iron polysaccharides (NIFEREX) 150 MG capsule Take 150 mg by mouth daily.     levothyroxine (SYNTHROID) 175 MCG tablet Take 175 mcg by mouth daily before breakfast.   5   loratadine (CLARITIN) 10 MG tablet TAKE 1 TABLET BY MOUTH EVERY DAY 90 tablet 1   metoprolol (TOPROL-XL) 200 MG 24 hr tablet Take 200 mg by mouth daily.   5   pantoprazole (PROTONIX) 40 MG tablet Take 40 mg by mouth 2 (two) times daily.      potassium chloride (KLOR-CON) 20 MEQ packet Take 40 mEq by mouth daily.      topiramate (TOPAMAX) 50 MG tablet Take 1 tablet (50 mg total) by mouth 2 (two) times daily. 60 tablet 3   traZODone (DESYREL) 50 MG tablet TAKE 0.5-1 TABLETS (25-50 MG TOTAL) BY MOUTH AT BEDTIME AS NEEDED. 90 tablet 1   No current facility-administered medications for this visit.    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   Complication of anesthesia    woke up during EGD   Dysphagia    Empty sella syndrome    Essential hypertension    Family history of breast cancer    Fibroids, intramural 02/17/2017   Gastroesophageal reflux disease    Generalized anxiety disorder    Graves disease 01/13/11   radioactive iodine treatment, 14.6 millicuries   Hiatal  hernia    small   History of chemotherapy    History of hysterectomy 02/17/2017   History of migraine headaches    History of radiation therapy    Iron deficiency anemia    Malignant neoplasm of upper-outer quadrant of left breast in female, 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   Please see review of systems for further details on the patient's review from today.   Objective:   Physical Exam:  LMP 02/13/2017 (Exact Date)   Physical Exam Constitutional:      General: She is not in acute distress. Musculoskeletal:        General: No deformity.  Neurological:     Mental Status: She is alert and oriented to person, place, and time.     Coordination: Coordination normal.  Psychiatric:        Attention and Perception: Attention and perception normal. She does not perceive auditory or visual hallucinations.        Mood and Affect: Affect is not labile, blunt, angry or inappropriate.        Speech: Speech normal.        Behavior: Behavior normal.        Thought Content: Thought content normal. Thought content is not paranoid or delusional. Thought content does not include homicidal or suicidal ideation. Thought content does not include homicidal or suicidal plan.        Cognition and Memory: Cognition and memory normal.        Judgment: Judgment normal.     Comments: Insight intact     Lab Review:     Component Value Date/Time   NA 142 05/17/2021 1551   K 3.7 05/17/2021 1551   CL 103 05/17/2021 1551   CO2 33 (H) 05/17/2021 1551   GLUCOSE 97 05/17/2021 1551    BUN 14 05/17/2021 1551   CREATININE 0.84 05/17/2021 1551   CALCIUM 9.6 05/17/2021 1551   PROT 7.2 05/17/2021 1551   ALBUMIN 4.0 05/17/2021 1551   AST 18 05/17/2021 1551   ALT 16 05/17/2021 1551   ALKPHOS 75 05/17/2021 1551   BILITOT 0.3 05/17/2021 1551   GFRNONAA >60 05/17/2021 1551   GFRAA >60 10/24/2019 1323   GFRAA >60 04/10/2019 1240       Component Value Date/Time   WBC 6.2 05/17/2021 1551   WBC 5.3 11/23/2020 1140  RBC 4.28 05/17/2021 1551   HGB 11.5 (L) 05/17/2021 1551   HCT 36.1 05/17/2021 1551   PLT 187 05/17/2021 1551   MCV 84.3 05/17/2021 1551   MCH 26.9 05/17/2021 1551   MCHC 31.9 05/17/2021 1551   RDW 13.2 05/17/2021 1551   LYMPHSABS 1.8 05/17/2021 1551   MONOABS 0.5 05/17/2021 1551   EOSABS 0.3 05/17/2021 1551   BASOSABS 0.0 05/17/2021 1551    No results found for: "POCLITH", "LITHIUM"   No results found for: "PHENYTOIN", "PHENOBARB", "VALPROATE", "CBMZ"   .res Assessment: Plan:    Plan:  PDMP reviewed  Wellbutrin XL 150mg  - 3 daily - denies seizure history. Pristiq 100mg  daily Trazadone 50mg  at hs  Therapist - Rockne Menghini - seeing as needed  Time spent with patient was 25 minutes. Greater than 50% of face to face time with patient was spent on counseling and coordination of care.    RTC 6 months  Patient advised to contact office with any questions, adverse effects, or acute worsening in signs and symptoms.  There are no diagnoses linked to this encounter.   Please see After Visit Summary for patient specific instructions.  Future Appointments  Date Time Provider Department Center  12/23/2022 12:00 PM Marieann Zipp, Thereasa Solo, NP [facility] None  01/16/2023  2:30 PM Lomax, Amy, NP [facility] None  06/12/2023 10:00 AM Serena Croissant, MD [facility] None    No orders of the defined types were placed in this encounter.   -------------------------------

## 2022-12-29 ENCOUNTER — Institutional Professional Consult (permissible substitution): Payer: Self-pay | Admitting: Neurology

## 2023-01-02 ENCOUNTER — Encounter: Payer: Self-pay | Admitting: Oncology

## 2023-01-12 NOTE — Patient Instructions (Addendum)
Please continue using your CPAP regularly. While your insurance requires that you use CPAP at least 4 hours each night on 70% of the nights, I recommend, that you not skip any nights and use it throughout the night if you can. Getting used to CPAP and staying with the treatment long term does take time and patience and discipline. Untreated obstructive sleep apnea when it is moderate to severe can have an adverse impact on cardiovascular health and raise her risk for heart disease, arrhythmias, hypertension, congestive heart failure, stroke and diabetes. Untreated obstructive sleep apnea causes sleep disruption, nonrestorative sleep, and sleep deprivation. This can have an impact on your day to day functioning and cause daytime sleepiness and impairment of cognitive function, memory loss, mood disturbance, and problems focussing. Using CPAP regularly can improve these symptoms.  We will update supply orders, today. Continue topiramate 50mg  twice daily. Continue working on healthy weight loss.   Follow up in 1 year
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
## 2023-01-16 ENCOUNTER — Ambulatory Visit: Payer: Managed Care, Other (non HMO) | Admitting: Family Medicine

## 2023-01-16 ENCOUNTER — Encounter: Payer: Self-pay | Admitting: Family Medicine

## 2023-01-16 VITALS — BP 117/78 | HR 65 | Ht 65.0 in | Wt 272.0 lb

## 2023-01-16 DIAGNOSIS — G5711 Meralgia paresthetica, right lower limb: Secondary | ICD-10-CM | POA: Diagnosis not present

## 2023-01-16 DIAGNOSIS — G4733 Obstructive sleep apnea (adult) (pediatric): Secondary | ICD-10-CM | POA: Diagnosis not present

## 2023-01-24 ENCOUNTER — Other Ambulatory Visit: Payer: Self-pay | Admitting: Adult Health

## 2023-01-24 DIAGNOSIS — F331 Major depressive disorder, recurrent, moderate: Secondary | ICD-10-CM

## 2023-02-13 ENCOUNTER — Other Ambulatory Visit: Payer: Self-pay | Admitting: Neurology

## 2023-05-08 ENCOUNTER — Encounter: Payer: Self-pay | Admitting: Oncology

## 2023-05-29 ENCOUNTER — Encounter: Payer: Self-pay | Admitting: Hematology and Oncology

## 2023-05-29 ENCOUNTER — Encounter: Payer: Self-pay | Admitting: Oncology

## 2023-05-29 ENCOUNTER — Other Ambulatory Visit: Payer: Self-pay | Admitting: Family Medicine

## 2023-05-29 DIAGNOSIS — Z1231 Encounter for screening mammogram for malignant neoplasm of breast: Secondary | ICD-10-CM

## 2023-06-06 ENCOUNTER — Encounter: Payer: Self-pay | Admitting: Oncology

## 2023-06-09 ENCOUNTER — Encounter: Payer: Self-pay | Admitting: Oncology

## 2023-06-12 ENCOUNTER — Ambulatory Visit: Payer: Commercial Managed Care - HMO | Admitting: Hematology and Oncology

## 2023-06-13 ENCOUNTER — Ambulatory Visit: Admitting: Adult Health

## 2023-06-13 ENCOUNTER — Encounter: Payer: Self-pay | Admitting: Adult Health

## 2023-06-13 ENCOUNTER — Ambulatory Visit (INDEPENDENT_AMBULATORY_CARE_PROVIDER_SITE_OTHER): Admitting: Adult Health

## 2023-06-13 DIAGNOSIS — F422 Mixed obsessional thoughts and acts: Secondary | ICD-10-CM | POA: Diagnosis not present

## 2023-06-13 DIAGNOSIS — F410 Panic disorder [episodic paroxysmal anxiety] without agoraphobia: Secondary | ICD-10-CM

## 2023-06-13 DIAGNOSIS — F331 Major depressive disorder, recurrent, moderate: Secondary | ICD-10-CM | POA: Diagnosis not present

## 2023-06-13 DIAGNOSIS — F411 Generalized anxiety disorder: Secondary | ICD-10-CM

## 2023-06-13 DIAGNOSIS — G4700 Insomnia, unspecified: Secondary | ICD-10-CM

## 2023-06-13 MED ORDER — BUPROPION HCL ER (XL) 150 MG PO TB24: ORAL_TABLET | ORAL | 1 refills | Status: DC

## 2023-06-13 MED ORDER — DESVENLAFAXINE SUCCINATE ER 100 MG PO TB24: 100.0000 mg | ORAL_TABLET | Freq: Every day | ORAL | 1 refills | Status: DC

## 2023-06-13 MED ORDER — TRAZODONE HCL 50 MG PO TABS: 25.0000 mg | ORAL_TABLET | Freq: Every evening | ORAL | 1 refills | Status: DC | PRN

## 2023-06-13 NOTE — Progress Notes (Signed)
 Loretta Nelson [id number] 1965/06/20 58 y.o.  Subjective:   Patient ID:  Loretta Nelson is a 58 y.o. (DOB [birth year]) female.  Chief Complaint: No chief complaint on file.   HPI Loretta Nelson presents to the office today for follow-up of  MDD, GAD, panic attacks, insomnia, mixed obsessional thoughts.  Describes mood today as "ok". Pleasant. Tearful at times. Mood symptoms - reports some depression and anxiety - "in and out". Denies irritability. Reports stable interest and motivation. Denies recent panic attacks. Denies worry, rumination, and over thinking - "working on doing the opposite". Reports decreased intrusive thoughts. Mood is variable - "stable for the most part". Stating "overall, I feel like I'm doing ok". Feels like medications are working well. Taking medications as prescribed. Energy levels stable. Active, does not have a regular exercise routine. Enjoys some usual interests and activities. Divorced. Mother living with her since 85 - serves as her care giver. Has 2 daughters at home. Appetite adequate. Weight loss - 30 pounds - 255 pounds.    Sleeping well most nights. Averages 6 hours with Trazadone.   Focus and concentration has been better. Managing some aspects of household. 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  Denies AH or VH. Denies self harm. Denies substance use.  Previous medication trials: Celexa  Mini-Mental    Flowsheet Row Office Visit from 01/15/2019 in [facility]  Total Score (max 30 points ) 30        Review of Systems:  Review of Systems  Musculoskeletal:  Negative for gait problem.  Neurological:  Negative for tremors.  Psychiatric/Behavioral:         Please refer to HPI    Medications: I have reviewed the patient's current medications.  Current Outpatient Medications  Medication Sig Dispense Refill   acetaminophen (TYLENOL) 500 MG tablet Take 2 tablets (1,000 mg total) by mouth every 8 (eight) hours  as needed. 30 tablet 0   amLODipine (NORVASC) 5 MG tablet Take 5 mg by mouth daily.   5   buPROPion (WELLBUTRIN XL) 150 MG 24 hr tablet TAKE THREE TABLETS EVERY MORNING. 270 tablet 1   Cholecalciferol 4000 UNITS CAPS Take 4,000 Units by mouth daily.      desvenlafaxine (PRISTIQ) 100 MG 24 hr tablet Take 1 tablet (100 mg total) by mouth daily. 90 tablet 1   diclofenac Sodium (VOLTAREN) 1 % GEL Apply 1 application topically 4 (four) times daily as needed (knee pain.).     EDARBYCLOR 40-25 MG TABS Take 1 tablet by mouth daily.  5   iron polysaccharides (NIFEREX) 150 MG capsule Take 150 mg by mouth daily.     levothyroxine (SYNTHROID) 175 MCG tablet Take 175 mcg by mouth daily before breakfast.   5   loratadine (CLARITIN) 10 MG tablet TAKE 1 TABLET BY MOUTH EVERY DAY 90 tablet 1   metoprolol (TOPROL-XL) 200 MG 24 hr tablet Take 200 mg by mouth daily.   5   pantoprazole (PROTONIX) 40 MG tablet Take 40 mg by mouth 2 (two) times daily.      potassium chloride (KLOR-CON) 20 MEQ packet Take 40 mEq by mouth daily.      topiramate (TOPAMAX) 50 MG tablet TAKE 1 TABLET BY MOUTH TWICE A DAY 60 tablet 3   traZODone (DESYREL) 50 MG tablet Take 0.5-1 tablets (25-50 mg total) by mouth at bedtime as needed. 90 tablet 1   No current facility-administered medications for this visit.    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   Complication of anesthesia    woke up during EGD   Dysphagia    Empty sella syndrome    Essential hypertension    Family history of breast cancer    Fibroids, intramural 02/17/2017   Gastroesophageal reflux disease    Generalized anxiety disorder    Graves disease 01/13/11   radioactive iodine treatment, 14.6  millicuries   Hiatal hernia    small   History of chemotherapy    History of hysterectomy 02/17/2017   History of migraine headaches    History of radiation therapy    Iron deficiency anemia    Malignant neoplasm of upper-outer quadrant of left breast in female, 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   Please see review of systems for further details on the patient's review from today.   Objective:   Physical Exam:  LMP 02/13/2017 (Exact Date)   Physical Exam Constitutional:      General: She is not in acute distress. Musculoskeletal:        General: No deformity.  Neurological:     Mental Status: She is alert and oriented to person, place, and time.     Coordination: Coordination normal.  Psychiatric:        Attention and Perception: Attention and perception normal. She does not perceive auditory or visual hallucinations.        Mood and Affect: Mood normal. Affect is not labile, blunt, angry or inappropriate.        Speech: Speech normal.        Behavior: Behavior normal.        Thought Content: Thought content normal. Thought content is not paranoid or delusional. Thought content does not include homicidal or suicidal ideation. Thought content does not include homicidal or suicidal plan.        Cognition and Memory: Cognition and memory normal.        Judgment: Judgment normal.     Comments: Insight intact     Lab Review:     Component Value Date/Time   NA 142 05/17/2021 1551   K 3.7 05/17/2021 1551   CL 103 05/17/2021 1551   CO2 33 (H) 05/17/2021  1551   GLUCOSE 97 05/17/2021 1551   BUN 14 05/17/2021 1551   CREATININE 0.84 05/17/2021 1551   CALCIUM 9.6 05/17/2021 1551   PROT 7.2 05/17/2021 1551   ALBUMIN 4.0 05/17/2021 1551   AST 18 05/17/2021 1551   ALT 16 05/17/2021 1551   ALKPHOS 75 05/17/2021 1551   BILITOT 0.3 05/17/2021 1551   GFRNONAA >60 05/17/2021 1551   GFRAA >60 10/24/2019 1323   GFRAA >60 04/10/2019 1240       Component Value Date/Time   WBC 6.2 05/17/2021 1551  WBC 5.3 11/23/2020 1140   RBC 4.28 05/17/2021 1551   HGB 11.5 (L) 05/17/2021 1551   HCT 36.1 05/17/2021 1551   PLT 187 05/17/2021 1551   MCV 84.3 05/17/2021 1551   MCH 26.9 05/17/2021 1551   MCHC 31.9 05/17/2021 1551   RDW 13.2 05/17/2021 1551   LYMPHSABS 1.8 05/17/2021 1551   MONOABS 0.5 05/17/2021 1551   EOSABS 0.3 05/17/2021 1551   BASOSABS 0.0 05/17/2021 1551    No results found for: "POCLITH", "LITHIUM"   No results found for: "PHENYTOIN", "PHENOBARB", "VALPROATE", "CBMZ"   .res Assessment: Plan:    Plan:  PDMP reviewed  Wellbutrin XL 150mg  - 3 daily - denies seizure history. Pristiq 100mg  daily Trazadone 50mg  at hs  Therapist - Reid Capuchin - seeing as needed  RTC 6 months  Patient advised to contact office with any questions, adverse effects, or acute worsening in signs and symptoms.   There are no diagnoses linked to this encounter.   Please see After Visit Summary for patient specific instructions.  Future Appointments  Date Time Provider Department Center  06/16/2023 11:40 AM [facility] MM 2 [facility]  06/26/2023 11:15 AM Cameron Cea, MD [facility] None  01/16/2024  3:00 PM Lomax, Amy, NP [facility] None    No orders of the defined types were placed in this encounter.   -------------------------------

## 2023-06-15 ENCOUNTER — Ambulatory Visit (INDEPENDENT_AMBULATORY_CARE_PROVIDER_SITE_OTHER): Payer: 59 | Admitting: Adult Health

## 2023-06-16 ENCOUNTER — Ambulatory Visit
Admission: RE | Admit: 2023-06-16 | Discharge: 2023-06-16 | Disposition: A | Discharge status: Home / Self Care | Source: Ambulatory Visit | Attending: Family Medicine | Admitting: Family Medicine

## 2023-06-16 DIAGNOSIS — Z1231 Encounter for screening mammogram for malignant neoplasm of breast: Secondary | ICD-10-CM

## 2023-06-22 ENCOUNTER — Other Ambulatory Visit: Payer: Self-pay | Admitting: Neurology

## 2023-06-26 ENCOUNTER — Inpatient Hospital Stay: Payer: Self-pay | Attending: Hematology and Oncology | Admitting: Hematology and Oncology

## 2023-06-26 VITALS — BP 138/60 | HR 59 | Temp 98.0°F | Resp 18 | Ht 65.0 in | Wt 257.0 lb

## 2023-06-26 DIAGNOSIS — Z08 Encounter for follow-up examination after completed treatment for malignant neoplasm: Secondary | ICD-10-CM | POA: Diagnosis present

## 2023-06-26 DIAGNOSIS — C50412 Malignant neoplasm of upper-outer quadrant of left female breast: Secondary | ICD-10-CM

## 2023-06-26 DIAGNOSIS — Z853 Personal history of malignant neoplasm of breast: Secondary | ICD-10-CM | POA: Insufficient documentation

## 2023-06-26 DIAGNOSIS — Z170 Estrogen receptor positive status [ER+]: Secondary | ICD-10-CM

## 2023-06-26 NOTE — Assessment & Plan Note (Signed)
 04/03/2019: Left breast T1 BN 0 stage Ib grade 3 IDC functional triple negative, ER 10% weak, PR 0%, HER2 negative, Ki-67 30% 04/17/2019: Left lumpectomy: T1 cN0 stage Ib grade 3 IDC triple negative with negative margins, 0/2 lymph nodes negative 08/13/2019-09/27/2019: Adjuvant radiation Genetics: Pathogenic variant in Bard 1   Current treatment: Anastrozole  started 10/24/2019 discontinued 05/20/2021 due to multiple side effects including hot flashes, joint stiffness, mood swings, worsening anxiety, weight gain etc. Because the final pathology was triple negative I did not think she would benefit from antiestrogen therapy.   Breast cancer surveillance: 1.  Breast exam 06/18/2023: Benign 2. mammogram 06/21/2022: Benign breast density category A 3.  Breast MRIs need to be done annually as well because of BARD 1 gene mutation.  Multiple family members have been affected with breast cancer.  11/04/2021: Benign breast density category A Breast MRI 12/09/2022: Benign breast density category A Although breast MRIs are indicated, her breast density is a category A and therefore we can consider doing MRIs every 2 to 3 years.   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

## 2023-06-26 NOTE — Progress Notes (Signed)
 Patient Care Team: Loretta Grave, MD as PCP - General (Family Medicine) Loretta Ebbing, MD as Consulting Physician (General Surgery) Loretta Myers, MD as Consulting Physician (Radiation Oncology) Loretta Lauber, MD as Consulting Physician (Endocrinology) Loretta Fairy, MD as Consulting Physician (Neurology) Loretta Rider, MD as Consulting Physician (Neurology) Loretta Lace, MD as Consulting Physician (Obstetrics and Gynecology) Loretta Hilts, MD as Consulting Physician (Gastroenterology)  DIAGNOSIS:  Encounter Diagnosis  Name Primary?   Malignant neoplasm of upper-outer quadrant of left breast in female, estrogen receptor positive (HCC) Yes    SUMMARY OF ONCOLOGIC HISTORY: Oncology History  Malignant neoplasm of upper-outer quadrant of left breast in female, estrogen receptor positive (HCC)  04/03/2019 Initial Diagnosis   04/03/2019: Left breast T1 BN 0 stage Ib grade 3 IDC functional triple negative, ER 10% weak, PR 0%, HER2 negative, Ki-67 30%    04/17/2019 Cancer Staging   Staging form: Breast, AJCC 8th Edition - Pathologic stage from 04/17/2019: Stage IB (pT1c, pN0, cM0, G3, ER-, PR-, HER2-) - Signed by Percival Brace, NP on 05/01/2019   04/17/2019 Surgery   Left lumpectomy: T1 cN0 stage Ib grade 3 IDC triple negative with negative margins, 0/2 lymph nodes negative    04/19/2019 Genetic Testing   Likely pathogenic variant in BARD1 called c.2242G>T identified on the [facility] Breast Cancer STAT Panel + Common Hereditary Cancers Panel. The report date is 04/19/2019. Remainder of testing was normal.  Recommendation: Breast MRIs need to be done annually as well because of BARD 1 gene mutation.  Multiple family members have been affected with breast cancer.   The STAT Breast cancer panel offered by [facility] includes sequencing and rearrangement analysis for the following 9 genes:  ATM, BRCA1, BRCA2, CDH1, CHEK2, PALB2, PTEN, STK11 and TP53.    The Common Hereditary Cancers Panel  offered by [facility] includes sequencing and/or deletion duplication testing of the following 48 genes: APC, ATM, AXIN2, BARD1, BMPR1A, BRCA1, BRCA2, BRIP1, CDH1, CDKN2A (p14ARF), CDKN2A (p16INK4a), CKD4, CHEK2, CTNNA1, DICER1, EPCAM (Deletion/duplication testing only), GREM1 (promoter region deletion/duplication testing only), KIT, MEN1, MLH1, MSH2, MSH3, MSH6, MUTYH, NBN, NF1, NHTL1, PALB2, PDGFRA, PMS2, POLD1, POLE, PTEN, RAD50, RAD51C, RAD51D, RNF43, SDHB, SDHC, SDHD, SMAD4, SMARCA4. STK11, TP53, TSC1, TSC2, and VHL.  The following genes were evaluated for sequence changes only: SDHA and HOXB13 c.251G>A variant only.   05/23/2019 - 07/27/2019 Chemotherapy   Patient is on Treatment Plan : BREAST TC q21d     08/13/2019 - 09/27/2019 Radiation Therapy   The patient initially received a dose of 50.4 Gy in 28 fractions to the breast using whole-breast tangent fields. This was delivered using a 3-D conformal technique. The patient then received a boost to the seroma. This delivered an additional 10 Gy in 5 fractions using a 3-field photon boost technique. The total dose was 60.4 Gy.    10/24/2019 - 05/20/2021 Anti-estrogen oral therapy   Anastrozole  started 10/24/2019 discontinued 05/20/2021 due to multiple side effects including hot flashes, joint stiffness, mood swings, worsening anxiety, weight gain etc. Because the final pathology was triple negative VG did not think she would benefit from antiestrogen therapy.     CHIEF COMPLIANT: Surveillance of breast cancer  HISTORY OF PRESENT ILLNESS:  History of Present Illness Loretta Nelson, a four-year breast cancer survivor, presents for a routine follow-up. She reports that her recent mammograms and MRIs have shown positive results, with no signs of recurrence. She mentions a persistent seroma that has been causing discomfort, but she has not had it drained. She  expresses hope that it will resolve on its own.  Loretta Nelson has a strong family history of breast cancer,  with both her sisters being survivors. Her daughter has tested positive for the mutation and is due to start her mammogram and MRI screenings. Loretta Nelson has been actively involved in raising awareness about breast cancer and has become somewhat of a spokesperson for the cause.  In addition to her cancer history, Loretta Nelson reports significant weight loss of approximately sixty pounds over the past year. She attributes this to dietary changes and increased physical activity. She also mentions a shift in her stress levels, moving from career-related stress to financial stress due to a change in her career path.     ALLERGIES:  is allergic to other and tape.  MEDICATIONS:  Current Outpatient Medications  Medication Sig Dispense Refill   amLODipine  (NORVASC ) 5 MG tablet Take 5 mg by mouth daily.   5   buPROPion  (WELLBUTRIN  XL) 150 MG 24 hr tablet Take three tablets every morning. 270 tablet 1   Cholecalciferol 4000 UNITS CAPS Take 4,000 Units by mouth daily.      desvenlafaxine  (PRISTIQ ) 100 MG 24 hr tablet Take 1 tablet (100 mg total) by mouth daily. 90 tablet 1   EDARBYCLOR 40-25 MG TABS Take 1 tablet by mouth daily.  5   iron polysaccharides (NIFEREX) 150 MG capsule Take 150 mg by mouth daily.     loratadine  (CLARITIN ) 10 MG tablet TAKE 1 TABLET BY MOUTH EVERY DAY 90 tablet 1   metoprolol  (TOPROL -XL) 200 MG 24 hr tablet Take 200 mg by mouth daily.   5   pantoprazole  (PROTONIX ) 40 MG tablet Take 40 mg by mouth 2 (two) times daily.      potassium chloride  (KLOR-CON ) 20 MEQ packet Take 40 mEq by mouth daily.      SYNTHROID  112 MCG tablet Take by mouth.     topiramate  (TOPAMAX ) 50 MG tablet TAKE 1 TABLET BY MOUTH TWICE A DAY 60 tablet 3   traZODone  (DESYREL ) 50 MG tablet Take 0.5-1 tablets (25-50 mg total) by mouth at bedtime as needed. 90 tablet 1   acetaminophen  (TYLENOL ) 500 MG tablet Take 2 tablets (1,000 mg total) by mouth every 8 (eight) hours as needed. (Patient not taking: Reported on  06/26/2023) 30 tablet 0   diclofenac Sodium (VOLTAREN) 1 % GEL Apply 1 application topically 4 (four) times daily as needed (knee pain.). (Patient not taking: Reported on 06/26/2023)     levothyroxine  (SYNTHROID ) 175 MCG tablet Take 175 mcg by mouth daily before breakfast.  (Patient not taking: Reported on 06/26/2023)  5   No current facility-administered medications for this visit.    PHYSICAL EXAMINATION: ECOG PERFORMANCE STATUS: 1 - Symptomatic but completely ambulatory  Vitals:   06/26/23 1133  BP: 138/60  Pulse: (!) 59  Resp: 18  Temp: 98 F (36.7 C)  SpO2: 100%   Filed Weights   06/26/23 1133  Weight: 257 lb (116.6 kg)     LABORATORY DATA:  I have reviewed the data as listed    Latest Ref Rng & Units 05/17/2021    3:51 PM 11/23/2020   11:40 AM 06/11/2020   11:29 AM  CMP  Glucose 70 - 99 mg/dL 97  960  454   BUN 6 - 20 mg/dL 14  21  12    Creatinine 0.44 - 1.00 mg/dL 0.98  1.19  1.47   Sodium 135 - 145 mmol/L 142  143  144   Potassium 3.5 -  5.1 mmol/L 3.7  3.8  3.4   Chloride 98 - 111 mmol/L 103  106  102   CO2 22 - 32 mmol/L 33  28  31   Calcium 8.9 - 10.3 mg/dL 9.6  9.8  9.4   Total Protein 6.5 - 8.1 g/dL 7.2  7.5  7.4   Total Bilirubin 0.3 - 1.2 mg/dL 0.3  0.3  0.4   Alkaline Phos 38 - 126 U/L 75  83  96   AST 15 - 41 U/L 18  19  17    ALT 0 - 44 U/L 16  17  16      Lab Results  Component Value Date   WBC 6.2 05/17/2021   HGB 11.5 (L) 05/17/2021   HCT 36.1 05/17/2021   MCV 84.3 05/17/2021   PLT 187 05/17/2021   NEUTROABS 3.6 05/17/2021    ASSESSMENT & PLAN:  Malignant neoplasm of upper-outer quadrant of left breast in female, estrogen receptor positive (HCC) 04/03/2019: Left breast T1 BN 0 stage Ib grade 3 IDC functional triple negative, ER 10% weak, PR 0%, HER2 negative, Ki-67 30% 04/17/2019: Left lumpectomy: T1 cN0 stage Ib grade 3 IDC triple negative with negative margins, 0/2 lymph nodes negative 08/13/2019-09/27/2019: Adjuvant radiation Genetics:  Pathogenic variant in Bard 1   Current treatment: Anastrozole  started 10/24/2019 discontinued 05/20/2021 due to multiple side effects including hot flashes, joint stiffness, mood swings, worsening anxiety, weight gain etc. Because the final pathology was triple negative I did not think she would benefit from antiestrogen therapy.   Breast cancer surveillance: 1.  Breast exam 06/18/2023: Benign 2. mammogram 06/21/2022: Benign breast density category A 3.  Breast MRIs need to be done annually as well because of BARD 1 gene mutation.  Multiple family members have been affected with breast cancer.  11/04/2021: Benign breast density category A Breast MRI 12/09/2022: Benign breast density category A I ordered annual breast MRIs.  Several members of her family have had breast cancer.   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   Return to clinic in 1 year for follow-up ------------------------------------- Assessment and Plan Assessment & Plan Malignant neoplasm of breast Four years post-diagnosis of estrogen receptor-positive breast cancer. Recent imaging reassuring. Family history significant for breast cancer and positive genetic mutation in daughter. - Continue annual mammogram screenings in April. - Schedule annual MRI screenings in October. - Encouraged family members to consider genetic testing through [facility] free testing program.  Seroma of breast Persistent seroma causing discomfort. No previous drainage. Significant weight loss may impact seroma. - Monitor seroma for changes in size or symptoms. - Consider drainage if seroma persists or causes significant discomfort.      No orders of the defined types were placed in this encounter.  The patient has a good understanding of the overall  plan. she agrees with it. she will call with any problems that may develop before the next visit here. Total time spent: 30 mins including face to face time and time spent for planning, charting and co-ordination of care   Margert Sheerer, MD 06/26/23

## 2023-10-16 ENCOUNTER — Other Ambulatory Visit: Payer: Self-pay | Admitting: *Deleted

## 2023-10-16 MED ORDER — TOPIRAMATE 50 MG PO TABS: 50.0000 mg | ORAL_TABLET | Freq: Two times a day (BID) | ORAL | 3 refills | Status: DC

## 2023-10-16 NOTE — Telephone Encounter (Signed)
 Last seen on 01/16/23 Follow up scheduled on 01/16/24

## 2023-11-14 ENCOUNTER — Ambulatory Visit (INDEPENDENT_AMBULATORY_CARE_PROVIDER_SITE_OTHER): Admitting: Psychiatry

## 2023-11-14 DIAGNOSIS — F331 Major depressive disorder, recurrent, moderate: Secondary | ICD-10-CM | POA: Diagnosis not present

## 2023-11-14 NOTE — Progress Notes (Signed)
 [facility] Counselor/Therapist Progress Note  Patient ID: Loretta Nelson, MRN: [id number],    Date: 11/14/2023  Time Spent: 50 minutes   Treatment Type: Individual Therapy  Reported Symptoms:   anxiety, tearful, sadness  Mental Status Exam:  Appearance:   Casual     Behavior:  Appropriate, Sharing, and Motivated  Motor:  Normal  Speech/Language:   Clear and Coherent  Affect:  Depressed and anxious  Mood:  anxious, depressed, and sad  Thought process:  goal directed  Thought content:    WNL  Sensory/Perceptual disturbances:    WNL  Orientation:  oriented to person, place, time/date, situation, day of week, month of year, year, and stated date of Sept. 16, 2025  Attention:  Good  Concentration:  Good and Fair  Memory:  WNL But sometime forgetful when stressed/upset  Fund of knowledge:   Good  Insight:    Good and Fair  Judgment:   Good  Impulse Control:  Good   Risk Assessment: Danger to Self:  No Self-injurious Behavior: No Danger to Others: No Duty to Warn:no Physical Aggression / Violence:No  Access to Firearms a concern: No  Gang Involvement:No   Subjective:  Patient back in for treatment today after being on my own for a while. Symptoms of depression, anxiety, triggers to her anxiety, tearfulness, sadness, less hopeful, forgetting at times denies any SI, and has remained on her medications prescribed by a provider in our office Regina Mozingo, PA-C. Living with her mom and her 30 yr old twin daughters. Self-talk includes doubting herself, trying to stay grounded in her faith. Challenges to staying focused but some difficulty. Is recognizing triggers to my anxiety and working to decrease her anxiety and be able to feel supported. Hard to handle stressors effectively. Doubting herself. Not sure what might help.Is staying on her medication. Isolates self at times. Is getting together with a friend for lunch today and seems to be looking forward to that.  Is  encouraged to do some journaling in between sessions and stay in contact with supportive friends and family.  To return within 1-2 weeks.   Interventions: Cognitive Behavioral Therapy, Solution-Oriented/Positive Psychology, and Ego-Supportive Long-term goal: Reduce overall level, frequency, and intensity of the anxiety so that daily functioning is not impaired.  Short-term goal: Verbalize an understanding of the role that fearful thinking plays in creating fears, excessive worry, and persistent anxiety symptoms. Strategies: Strengthen her new nonavoidang approach and build confidence.     Diagnosis:   ICD-10-CM   1. Major depressive disorder, recurrent episode, moderate (HCC)  F33.1      Plan: Patient today showing good participation and some motivation although not as motivated as she typically has been in the past.  Her current anxiety and depression are impacting her mood but she reports feeling motivated to be in therapy and denies any SI.  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  Some increase in self-doubt.  Worked well in session, speaking up for herself in terms of reporting her symptoms and also her willingness to work on issues even though her motivation is not as strong as she would like right now.  More self-doubt and not as quick to notice her positives.  Does have supportive people in her life which we spoke about  in session today.  Review of treatment goals and she feels these are appropriate for her.  Will see patient again in approximately 1-2 weeks. Encouraged patient to be practicing more positive behaviors as noted in session including: Practicing more self acceptance, use of appropriate boundaries and understand that she does not have to explain to everybody when they ask questions, not over  personalizing situations and interactions, better management of anxiety and depression through use of strategies discussed in sessions, continue working on decreased perfectionistic tendencies, stay in the present focusing on what she can control or change versus cannot, keep contact with people who are supportive of her, get outside daily and walk, avoid assuming worst-case scenarios, look more for the positives versus negatives daily, healthy nutrition and exercise, use of daily affirmations, positive self-talk, reduce overthinking and overanalyzing, be involved in more things that she enjoys that are healthy for her, saying no when she needs to say no, allow her faith to be an emotional support as well as spiritual, and realize the strength she shows when working with goal-directed behaviors helping her move herself in a direction that supports her overall emotional health.  Goal review and progress/challenges noted with patient.  Next appointment within 2 weeks.   Barnie Bunde, LCSW

## 2023-11-30 ENCOUNTER — Ambulatory Visit (INDEPENDENT_AMBULATORY_CARE_PROVIDER_SITE_OTHER): Admitting: Psychiatry

## 2023-11-30 DIAGNOSIS — F331 Major depressive disorder, recurrent, moderate: Secondary | ICD-10-CM | POA: Diagnosis not present

## 2023-11-30 NOTE — Progress Notes (Signed)
 [facility] Counselor/Therapist Progress Note  Patient ID: Loretta Nelson, MRN: [id number],    Date: 11/30/2023  Time Spent: 55 minutes   Treatment Type: Individual Therapy  Reported Symptoms: depression, anxiety, some tearfulness (better), sad, stress level improving     Mental Status Exam:  Appearance:   Casual     Behavior:  Appropriate, Sharing, and Motivated  Motor:  Normal  Speech/Language:   Clear and Coherent  Affect:  Depressed and anxious  Mood:  anxious and depressed  Thought process:  goal directed  Thought content:    Rumination  Sensory/Perceptual disturbances:    WNL  Orientation:  oriented to person, place, time/date, situation, day of week, month of year, year, and stated date of Oct. 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 Denies any SI.  Is close to her sister who lives  in [place] area and is a Education officer, environmental.  Both young adult daughters living with patient. Tearful off and on. Am better when things are going well. Discussing today more positive self-care. Has 2 really good friends and in frequent contact.  Focused on some ways she can have more contact with other people and still feel comfortable.  Also focusing on more positive health and some of her accomplishments versus looking only at the negatives.  Does recognize her self-defeating thoughts and behaviors at times and working to interrupt them.  Good support  from other adults in the family including her adult daughters and from some friends.  Trying to pay attention to triggers of her anxiety and depression and working to interrupt them.  Also changing up her self-talk to be more positive and encouraging.  Some doubting of herself.  Review of treatment goals and to return within 2 weeks.      Interventions: Cognitive Behavioral Therapy, Solution-Oriented/Positive Psychology, and Ego-Supportive Long-term goal: Reduce overall level, frequency, and intensity of the anxiety so that daily functioning is not impaired.  Short-term goal: Verbalize an understanding of the role that fearful thinking plays in creating fears, excessive worry, and persistent anxiety symptoms. Strategies: Strengthen her new nonavoidang approach and build confidence.     Diagnosis:   ICD-10-CM   1. Major depressive disorder, recurrent episode, moderate (HCC)  F33.1      Plan: Patient today working well in session on her motivation, her self-talk, anxiety, depression, and looking at strategies that can help her through this time of increased depression and anxiety.  Looking at what has helped previously and has a willingness to use similar strategies now.  Also willing to interrupt negative thinking and/or behaviors that contribute to her feeling more anxious and more depressed which we discussed in detail in session today.  Does have good support and states that she has no problem contacting those in her support system as needed.  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  Is thankful for the supportive people in her life.  Reviewed treatment goals and patient is in agreement.  Continue to encourage patient in her practice of more positive behaviors as noted in session including: Practicing self acceptance, use of appropriate boundaries and understand that she does not have to explain to everybody when they ask questions, not over personalizing situations and interactions, better management of anxiety and depression through use of strategies discussed in sessions, continue working on decreased perfectionistic tendencies, stay in the present focusing on what she can control or change versus cannot, keep contact with people who are supportive of her, get outside daily and walk, avoid assuming worst-case scenarios, look more for the positives versus negatives each day, healthy nutrition and exercise, use of daily affirmations, positive self-talk, reduce overthinking and overanalyzing, be involved in more things that she enjoys that are healthy for her, saying no when she needs to say no, allow her faith to be an emotional support as well as spiritual, and recognize the strength she shows when working with goal-directed behaviors and to help her move in a direction that supports her overall emotional health.  Goal review and progress/challenges noted with patient.  Next appointment within 2 weeks.   Barnie Bunde, LCSW

## 2023-12-11 ENCOUNTER — Other Ambulatory Visit

## 2023-12-12 ENCOUNTER — Ambulatory Visit: Admitting: Psychiatry

## 2023-12-12 DIAGNOSIS — F331 Major depressive disorder, recurrent, moderate: Secondary | ICD-10-CM

## 2023-12-12 NOTE — Progress Notes (Signed)
 [facility] Counselor/Therapist Progress Note  Patient ID: Loretta Nelson, MRN: [id number],    Date: 12/12/2023  Time Spent: 53 minutes   Treatment Type: Individual Therapy  Reported Symptoms: depression, anxiety, sad, needing to focus better, tearfulness, not as stressed financially    Mental Status Exam:  Appearance:   Casual     Behavior:  Appropriate and Sharing  Motor:  Normal  Speech/Language:   Clear and Coherent  Affect:  Anxious, some depression  Mood:  anxious and depressed  Thought process:  goal directed  Thought content:    Rumination  Sensory/Perceptual disturbances:    WNL  Orientation:  oriented to person, place, time/date, situation, day of week, month of year, year, and stated date of Oct. 14, 2025  Attention:  Fair  Concentration:  Fair  Memory:  Reporting some memory and concentration issues  Fund of knowledge:   Good and Fair  Insight:    Good and Fair  Judgment:   Good  Impulse Control:  Good   Risk Assessment: Danger to Self:  No Self-injurious Behavior: No Danger to Others: No Duty to Warn:no Physical Aggression / Violence:No  Access to Firearms a concern: No  Gang Involvement:No   Subjective:  Patient in session today reporting anxiety, some depression, and now some focusing issues.  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 Concerns expressed about my memory testing . Both of her young adult daughters living with patient. Some tearfulness. Reports that my recall is an issue and my processing and focus is an issue. Tearful intermittently. Lots of shoulds shared and processed with patient. Venting lot of her emotions and did seem to feel some better and supported by session end.  Adds that her daughters are supportive as well as other family members and friends.  Trying to focus on being more positive in her health, staying on any prescribed medication as prescribed, eating healthy, relying on her faith, and  connecting with friends that are supportive of her.  Continues to pay attention to triggers with her depression and anxiety and working to interrupt them, while also changing up her self-talk to be more positive and encouraging.   Interventions: Cognitive Behavioral Therapy, Solution-Oriented/Positive Psychology, and Ego-Supportive Long-term goal: Reduce overall level, frequency, and intensity of the anxiety so that daily functioning is not impaired.  Short-term goal: Verbalize an understanding of the role that fearful thinking plays in creating fears, excessive worry, and persistent anxiety symptoms. Strategies: Strengthen her new non-avoiding approach and build confidence.    Diagnosis:   ICD-10-CM   1. Major depressive disorder, recurrent episode, moderate (HCC)  F33.1      Plan: Patient today in session and focusing on some depression, anxiety, and recently reported focusing issues.  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  Looking at what has helped some in the past and wanting to use those things in the present.  Trying to interrupt negative thought patterns and venting a lot of emotion today and seem to feel better and supported by session and.  Her 2 daughters are supportive also as well as extended family members, and friends that are local and away.  Does have good support system at home, within the family, church, and beyond.  Working with therapist today as we discussed her treatment  goals which she feels main appropriate for her at this point. Reminded and encouraged patient in her practice of more positive behaviors as noted in session including: Practicing self acceptance, use of appropriate boundaries and understand that she does not have to explain to everybody when they ask questions, not over personalizing situations and interactions, better management of  anxiety and depression through use of strategies discussed in sessions, continue working on decreased perfectionistic tendencies, stay in the present focusing on what she can control or change versus cannot, keep contact with people who are supportive of her, get outside daily and walk, avoid assuming worst-case scenarios, look more for the positives versus negatives daily, healthy nutrition and exercise, use of daily affirmations, positive self-talk, reduce overthinking and overanalyzing, be involved in more things that she enjoys that are healthy for her, saying no when she needs to say no, allow her faith to be an emotional support as well as spiritual, and realize the strength she shows when working with goal-directed behaviors that can help her move in a direction supporting her overall emotional health.  Goal review and progress/challenges noted with patient.  Next appointment within 2 to 3 weeks.   Barnie Bunde, LCSW

## 2023-12-13 ENCOUNTER — Other Ambulatory Visit: Payer: Self-pay | Admitting: Adult Health

## 2023-12-13 ENCOUNTER — Ambulatory Visit
Admission: RE | Admit: 2023-12-13 | Discharge: 2023-12-13 | Disposition: A | Discharge status: Home / Self Care | Source: Ambulatory Visit | Attending: Hematology and Oncology | Admitting: Hematology and Oncology

## 2023-12-13 ENCOUNTER — Ambulatory Visit: Payer: Self-pay | Admitting: Hematology and Oncology

## 2023-12-13 DIAGNOSIS — Z170 Estrogen receptor positive status [ER+]: Secondary | ICD-10-CM

## 2023-12-13 DIAGNOSIS — F331 Major depressive disorder, recurrent, moderate: Secondary | ICD-10-CM

## 2023-12-13 MED ORDER — GADOPICLENOL 0.5 MMOL/ML IV SOLN
10.0000 mL | Freq: Once | INTRAVENOUS | Status: AC | PRN
  Administered 2023-12-13: 10 mL via INTRAVENOUS

## 2023-12-13 NOTE — Telephone Encounter (Signed)
 I called Loretta Nelson to give the results of the breast MRI.  The results were excellent.  Unfortunately I could not leave a message because her mailbox was full.

## 2023-12-14 ENCOUNTER — Ambulatory Visit: Admitting: Adult Health

## 2023-12-14 ENCOUNTER — Encounter: Payer: Self-pay | Admitting: Adult Health

## 2023-12-14 DIAGNOSIS — F902 Attention-deficit hyperactivity disorder, combined type: Secondary | ICD-10-CM

## 2023-12-14 DIAGNOSIS — F410 Panic disorder [episodic paroxysmal anxiety] without agoraphobia: Secondary | ICD-10-CM

## 2023-12-14 DIAGNOSIS — F422 Mixed obsessional thoughts and acts: Secondary | ICD-10-CM

## 2023-12-14 DIAGNOSIS — G4700 Insomnia, unspecified: Secondary | ICD-10-CM

## 2023-12-14 DIAGNOSIS — F331 Major depressive disorder, recurrent, moderate: Secondary | ICD-10-CM

## 2023-12-14 DIAGNOSIS — F411 Generalized anxiety disorder: Secondary | ICD-10-CM | POA: Diagnosis not present

## 2023-12-14 MED ORDER — BUPROPION HCL ER (XL) 150 MG PO TB24: ORAL_TABLET | ORAL | 1 refills | Status: AC

## 2023-12-14 MED ORDER — TRAZODONE HCL 50 MG PO TABS: 25.0000 mg | ORAL_TABLET | Freq: Every evening | ORAL | 1 refills | Status: AC | PRN

## 2023-12-14 MED ORDER — DESVENLAFAXINE SUCCINATE ER 100 MG PO TB24: 100.0000 mg | ORAL_TABLET | Freq: Every day | ORAL | 1 refills | Status: AC

## 2023-12-14 NOTE — Progress Notes (Signed)
 Loretta Nelson [id number] [birth year] 58 y.o.  Subjective:   Patient ID:  Loretta Nelson is a 58 y.o. (DOB 1965-09-17) female.  Chief Complaint: No chief complaint on file.   HPI Eleri Ruben Lawniczak presents to the office today for follow-up of MDD, GAD, panic attacks, insomnia, mixed obsessional thoughts.  Describes mood today as not too good. Pleasant. Tearful at times. Mood symptoms - reports depression. Reports anxiety is manageable. Reports feeling irritable at times. Reports getting overwhelmed easily. Reports getting distracted easily. Reports varying interest and motivation. Denies recent panic attacks. Denies intrusive thoughts. Reports worry, rumination, and over thinking. Reports mood is variable - it depends on how my day goes - it's up and down. Stating I feel like I'm struggling. Feels like medications are helpful, but is willing to consider other options. Taking medications as prescribed. Energy levels are on and off - PCP working on thyroid  and BP meds. Active, does not have a regular exercise routine. Enjoys some usual interests and activities. Divorced. Mother living with her since 89 - serves as her care giver. Has 2 daughters at home. Appetite adequate. Weight stable - 255 pounds.    Sleeping well most nights. Averages 7 to 8 hours with Trazadone.   Reporting focus and concentration difficulties. Managing some aspects of household. 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 Denies SI or HI.  Denies AH or VH. Denies self harm. Denies substance use.  Previous medication trials:  Celexa  Rexulti    Mini-Mental    Flowsheet Row Office Visit from 01/15/2019 in [facility]  Total Score (max 30 points ) 30     Review of Systems:  Review of Systems  Musculoskeletal:  Negative for gait problem.  Neurological:  Negative for tremors.  Psychiatric/Behavioral:         Please refer to HPI     Medications: I have reviewed the patient's current medications.  Current Outpatient Medications  Medication Sig Dispense Refill   acetaminophen  (TYLENOL ) 500 MG tablet Take 2 tablets (1,000 mg total) by mouth every 8 (eight) hours as needed. (Patient not taking: Reported on 06/26/2023) 30 tablet 0   amLODipine  (NORVASC ) 5 MG tablet Take 5 mg by mouth daily.   5   buPROPion  (WELLBUTRIN  XL) 150 MG 24 hr tablet Take three tablets every morning. 270 tablet 1   Cholecalciferol 4000 UNITS CAPS Take 4,000 Units by mouth daily.      desvenlafaxine  (PRISTIQ ) 100 MG 24 hr tablet Take 1 tablet (100 mg total) by mouth daily. 90 tablet 1   diclofenac Sodium (VOLTAREN) 1 % GEL Apply 1 application topically 4 (four) times daily as needed (knee pain.). (Patient not taking: Reported on 06/26/2023)     EDARBYCLOR 40-25 MG TABS Take 1 tablet by mouth daily.  5   iron polysaccharides (NIFEREX) 150 MG capsule Take 150 mg by mouth daily.     levothyroxine  (SYNTHROID ) 175 MCG tablet Take 175 mcg by mouth daily before breakfast.  (Patient not taking: Reported on 06/26/2023)  5   loratadine  (CLARITIN ) 10 MG tablet TAKE 1 TABLET BY MOUTH EVERY DAY 90 tablet 1   metoprolol  (TOPROL -XL) 200 MG 24 hr tablet Take 200 mg by mouth daily.   5   pantoprazole  (PROTONIX ) 40 MG tablet Take 40 mg by mouth 2 (two) times daily.      potassium chloride  (KLOR-CON ) 20 MEQ packet Take 40 mEq by mouth daily.      SYNTHROID  112  MCG tablet Take by mouth.     topiramate  (TOPAMAX ) 50 MG tablet Take 1 tablet (50 mg total) by mouth 2 (two) times daily. 60 tablet 3   traZODone  (DESYREL ) 50 MG tablet Take 0.5-1 tablets (25-50 mg total) by mouth at bedtime as needed. 90 tablet 1   No current facility-administered medications for this visit.    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   Complication of anesthesia    woke up during EGD   Dysphagia    Empty sella syndrome    Essential hypertension    Family history of breast cancer    Fibroids, intramural 02/17/2017   Gastroesophageal reflux disease    Generalized anxiety disorder    Graves disease 01/13/11   radioactive iodine  treatment, 14.6 millicuries   Hiatal hernia    small   History of chemotherapy    History of hysterectomy 02/17/2017   History of migraine headaches    History of radiation therapy    Iron deficiency anemia    Malignant neoplasm of upper-outer quadrant of left breast in female, 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   Please see review of systems for further details on the patient's review from today.   Objective:   Physical Exam:  LMP 02/13/2017 (Exact Date)   Physical Exam Constitutional:      General: She is not in acute distress. Musculoskeletal:        General: No deformity.  Neurological:     Mental Status: She is alert and oriented to person, place, and time.     Coordination: Coordination normal.  Psychiatric:        Attention and Perception: Attention and perception normal. She does not perceive auditory or visual hallucinations.        Mood and Affect: Mood normal. Mood is not anxious or depressed. Affect is not labile, blunt, angry or inappropriate.        Speech: Speech normal.        Behavior: Behavior normal.         Thought Content: Thought content normal. Thought content is not paranoid or delusional. Thought content does not include homicidal or suicidal ideation. Thought content does not include homicidal or suicidal plan.        Cognition and Memory: Cognition and memory normal.        Judgment: Judgment normal.     Comments: Insight intact     Lab Review:     Component Value Date/Time   NA 142 05/17/2021 1551   K 3.7 05/17/2021 1551   CL 103 05/17/2021 1551   CO2 33 (H) 05/17/2021 1551   GLUCOSE 97 05/17/2021 1551   BUN 14 05/17/2021 1551   CREATININE 0.84 05/17/2021 1551   CALCIUM 9.6 05/17/2021  1551   PROT 7.2 05/17/2021 1551   ALBUMIN 4.0 05/17/2021 1551   AST 18 05/17/2021 1551   ALT 16 05/17/2021 1551   ALKPHOS 75 05/17/2021 1551   BILITOT 0.3 05/17/2021 1551   GFRNONAA >60 05/17/2021 1551   GFRAA >60 10/24/2019 1323   GFRAA >60 04/10/2019 1240       Component Value Date/Time   WBC 6.2 05/17/2021 1551   WBC 5.3 11/23/2020 1140   RBC 4.28 05/17/2021 1551   HGB 11.5 (L) 05/17/2021 1551   HCT 36.1 05/17/2021 1551   PLT 187 05/17/2021 1551   MCV 84.3 05/17/2021 1551   MCH 26.9 05/17/2021 1551   MCHC 31.9 05/17/2021 1551   RDW 13.2 05/17/2021 1551   LYMPHSABS 1.8 05/17/2021 1551   MONOABS 0.5 05/17/2021 1551   EOSABS 0.3 05/17/2021 1551   BASOSABS 0.0 05/17/2021 1551    No results found for: POCLITH, LITHIUM   No results found for: PHENYTOIN, PHENOBARB, VALPROATE, CBMZ   .res Assessment: Plan:    Plan:  PDMP reviewed  Wellbutrin  XL 150mg  - 3 daily - denies seizure history. Pristiq  100mg  daily Trazadone 50mg  at hs   Will add Concerta 27mg  every morning for ADD symptoms. Patient has completed several ADD screening assessments indicating an ADD diagnosis. Will add a low dose of Concerta for symptom management. She also reports a daughter with ADD doing well on Concerta.  Therapist - Marval Bunde - seeing as needed  RTC 6 months  Patient  advised to contact office with any questions, adverse effects, or acute worsening in signs and symptoms.   There are no diagnoses linked to this encounter.   Please see After Visit Summary for patient specific instructions.  Future Appointments  Date Time Provider Department Center  12/26/2023 10:00 AM Bunde Sober, LCSW [facility] None  01/09/2024 10:00 AM Bunde Sober, LCSW [facility] None  01/16/2024  3:00 PM Cary No, NP [facility] None  01/23/2024 10:00 AM Bunde Sober, LCSW [facility] None  06/25/2024 11:15 AM Odean Potts, MD [facility] None    No orders of the defined types were placed in this encounter.   -------------------------------

## 2023-12-15 MED ORDER — METHYLPHENIDATE HCL ER (OSM) 27 MG PO TBCR: 27.0000 mg | EXTENDED_RELEASE_TABLET | ORAL | 0 refills | Status: DC

## 2023-12-26 ENCOUNTER — Ambulatory Visit (INDEPENDENT_AMBULATORY_CARE_PROVIDER_SITE_OTHER): Admitting: Psychiatry

## 2023-12-26 DIAGNOSIS — F331 Major depressive disorder, recurrent, moderate: Secondary | ICD-10-CM

## 2023-12-26 NOTE — Progress Notes (Signed)
 [facility] Counselor/Therapist Progress Note  Patient ID: Loretta Nelson, MRN: [id number],    Date: 12/26/2023  Time Spent: 53 minutes   Treatment Type: Individual Therapy  Reported Symptoms: anxiety some better, started Concerta and that is helping some, depression improved, less tearfulness, focus is some better, things don't feel as overwhelming   Mental Status Exam:  Appearance:   Casual and Neat     Behavior:  Appropriate, Sharing, and Motivated  Motor:  Normal  Speech/Language:   Clear and Coherent  Affect:  Less depressed  Mood:  anxious and less depressed   Thought process:  goal directed  Thought content:    WNL  Sensory/Perceptual disturbances:    WNL  Orientation:  oriented to person, place, time/date, situation, day of week, month of year, year, and stated date of Oct. 28, 2025  Attention:  Good  Concentration:  Good and Fair  Memory:  WNL  Fund of knowledge:   Good  Insight:    Good  Judgment:   Good  Impulse Control:  Good   Risk Assessment: Danger to Self:  No Self-injurious Behavior: No Danger to Others: No Duty to Warn:no Physical Aggression / Violence:No  Access to Firearms a concern: No  Gang Involvement:No   Subjective:   Patient working in session today on her continued issues of anxiety, some depression, focus is better, and reports not feeling as overwhelmed. Very little tearfulness today.Did get started on Concerta and it seems to be helping.  Mother is living with patient and patient today is wanting to work further on some relationship issues with mother. Can be a weight for me, with mom living with patient, and patient sharing and processing these thoughts/feelings. Needing at times to set limits with mom and is working on this more intentionally but not being neglectful; just more of a matter of taking care of patient's own needs and boundaries. Really needed this talking through about how stressed and overwhelmed she is with family  caregiving. Stressors include her mother, other family situations, and care-giving which she processed more openly today. Also working in healthier boundaries and being able to ask for help. Focus is better. Daughers willing to help at home. Less  shoulds. Working to get rid of some of the guilt I feel. Several friends have lost their mothers recently. Definitely feeling she is making progress and is not as depressed. Head more clear. Working to be more positive in her health and in  her interactions with others, remain on meds as prescribed, eating healthier, connecting with friend, improving her self-talk to better encouraging herself.    Interventions: Cognitive Behavioral Therapy, Solution-Oriented/Positive Psychology, and Ego-Supportive Long-term goal: Reduce overall level, frequency, and intensity of the anxiety so that daily functioning is not impaired.  Short-term goal: Verbalize an understanding of the role that fearful thinking plays in creating fears, excessive worry, and persistent anxiety symptoms. Strategies: Strengthen her new non-avoiding approach and build confidence.    Diagnosis:   ICD-10-CM   1. Major depressive disorder, recurrent episode, moderate (HCC)  F33.1      Plan: Patient today noticing some improvement in her memory and mood, and I think the memory improvement is helping her mood and she agreed.  Very little tearfulness today as she talked through some of her depression, anxiety, and some challenges with a family member that lives with her.  Continues to try and interrupt negative thought patterns and stated today in session that she felt more open  and able to talk more fully, especially with some improvement she is noting, in taking the Concerta.  Less feeling like brain fog and able to be more on task.  Interrupting some of her negative thoughts and venting a lot of her concerns more freely today.  Looking at what she can change and what she cannot change and  trying to be more accepting of that.  Feels that she does have a good support system, some within her family, church, and beyond.  Reminded and encouraged patient in her practice of more positive behaviors as noted in session including: Practicing self acceptance, use of appropriate boundaries and understand that she does not have to explain to everybody when they ask questions, not over personalizing situations and interactions, better management of anxiety and depression through use of strategies discussed in sessions, continue working on decreased perfectionistic tendencies, stay in the present focusing on what she can control or change versus cannot, keep contact with people who are supportive of her, get outside daily and walk, avoid assuming worst-case scenarios, look more for the positives versus negatives daily, healthy nutrition and exercise, use of daily affirmations, positive self-talk, reduce overthinking and overanalyzing, be involved in more things that she enjoys that are healthy for her, saying no when she needs to say no, allow her faith to be an emotional support as well as spiritual, and recognize the strength she shows when working with goal-directed behaviors that can help her move in a direction that better supports her overall emotional health.  Goal review and progress/challenges noted with patient.  Next appointment within 2 to 3 weeks.   Barnie Bunde, LCSW

## 2024-01-08 ENCOUNTER — Telehealth (INDEPENDENT_AMBULATORY_CARE_PROVIDER_SITE_OTHER): Admitting: Adult Health

## 2024-01-08 ENCOUNTER — Encounter: Payer: Self-pay | Admitting: Adult Health

## 2024-01-08 DIAGNOSIS — F410 Panic disorder [episodic paroxysmal anxiety] without agoraphobia: Secondary | ICD-10-CM | POA: Diagnosis not present

## 2024-01-08 DIAGNOSIS — F422 Mixed obsessional thoughts and acts: Secondary | ICD-10-CM

## 2024-01-08 DIAGNOSIS — F909 Attention-deficit hyperactivity disorder, unspecified type: Secondary | ICD-10-CM

## 2024-01-08 DIAGNOSIS — F902 Attention-deficit hyperactivity disorder, combined type: Secondary | ICD-10-CM

## 2024-01-08 DIAGNOSIS — G4700 Insomnia, unspecified: Secondary | ICD-10-CM

## 2024-01-08 DIAGNOSIS — F411 Generalized anxiety disorder: Secondary | ICD-10-CM | POA: Diagnosis not present

## 2024-01-08 DIAGNOSIS — F331 Major depressive disorder, recurrent, moderate: Secondary | ICD-10-CM

## 2024-01-08 MED ORDER — METHYLPHENIDATE HCL ER (OSM) 27 MG PO TBCR: 27.0000 mg | EXTENDED_RELEASE_TABLET | ORAL | 0 refills | Status: DC

## 2024-01-08 NOTE — Progress Notes (Addendum)
 Loretta Nelson [id number] 07/15/1965 58 y.o.  Virtual Visit via Video Note  I connected with pt @ on 01/08/24 at 11:00 AM EST by a video enabled telemedicine application and verified that I am speaking with the correct person using two identifiers.   I discussed the limitations of evaluation and management by telemedicine and the availability of in person appointments. The patient expressed understanding and agreed to proceed.  I discussed the assessment and treatment plan with the patient. The patient was provided an opportunity to ask questions and all were answered. The patient agreed with the plan and demonstrated an understanding of the instructions.   The patient was advised to call back or seek an in-person evaluation if the symptoms worsen or if the condition fails to improve as anticipated.  I provided 25 minutes of non-face-to-face time during this encounter.  The patient was located at home.  The provider was located at [facility].   [name], NP   Subjective:   Patient ID:  Loretta Nelson is a 58 y.o. (DOB 1965-07-25) female.  Chief Complaint: No chief complaint on file.   HPI Loretta Nelson presents for follow-up of ADD, MDD, GAD, panic attacks, insomnia, mixed obsessional thoughts.  Describes mood today as better. Pleasant. Reports tearfulness at times. Mood symptoms - reports depression - from time to time. Reports decreased anxiety doing better with that. Denies irritability - feels more pleasant. Reports feeling overwhelmed - I can handle it better. Reports still feeling distracted easily. Reports improved interest and motivation - I want to do things. Denies recent panic attacks. Reports decreased intrusive thoughts - able to cut it off. Reports worry, rumination, and over thinking - a little, but not as much. Reports mood is variable - it's situational. Stating I feel like some things have improved - not getting as upset about  things. Feels like medication changes has been helpful. Taking medications as prescribed. Energy levels have improved - PCP working on thyroid  and BP meds. Active, does not have a regular exercise routine. Enjoys some usual interests and activities. Divorced. Mother living with her since 81 - serves as her care giver. Has 2 daughters at home. Appetite adequate. Weight stable - 255 pounds.    Sleeping well most nights. Averages 7 to 8 hours with Trazadone.   Reports focus and concentration has improved with the addition of Concerta. Reports organizing things at work. Managing some aspects of household. 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 Denies SI or HI.  Denies AH or VH. Denies self harm. Denies substance use.  Previous medication trials:  Celexa  Rexulti   Review of Systems:  Review of Systems  Musculoskeletal:  Negative for gait problem.  Neurological:  Negative for tremors.  Psychiatric/Behavioral:         Please refer to HPI    Medications: I have reviewed the patient's current medications.  Current Outpatient Medications  Medication Sig Dispense Refill   acetaminophen  (TYLENOL ) 500 MG tablet Take 2 tablets (1,000 mg total) by mouth every 8 (eight) hours as needed. (Patient not taking: Reported on 06/26/2023) 30 tablet 0   amLODipine  (NORVASC ) 5 MG tablet Take 5 mg by mouth daily.   5   buPROPion  (WELLBUTRIN  XL) 150 MG 24 hr tablet Take three tablets every morning. 270 tablet 1   Cholecalciferol 4000 UNITS CAPS Take 4,000 Units by mouth daily.      desvenlafaxine  (PRISTIQ ) 100 MG 24 hr tablet Take 1 tablet (  100 mg total) by mouth daily. 90 tablet 1   diclofenac Sodium (VOLTAREN) 1 % GEL Apply 1 application topically 4 (four) times daily as needed (knee pain.). (Patient not taking: Reported on 06/26/2023)     EDARBYCLOR 40-25 MG TABS Take 1 tablet by mouth daily.  5   iron polysaccharides (NIFEREX) 150 MG capsule Take 150 mg by  mouth daily.     levothyroxine  (SYNTHROID ) 175 MCG tablet Take 175 mcg by mouth daily before breakfast.  (Patient not taking: Reported on 06/26/2023)  5   loratadine  (CLARITIN ) 10 MG tablet TAKE 1 TABLET BY MOUTH EVERY DAY 90 tablet 1   methylphenidate (CONCERTA) 27 MG PO CR tablet Take 1 tablet (27 mg total) by mouth every morning. 30 tablet 0   metoprolol  (TOPROL -XL) 200 MG 24 hr tablet Take 200 mg by mouth daily.   5   pantoprazole  (PROTONIX ) 40 MG tablet Take 40 mg by mouth 2 (two) times daily.      potassium chloride  (KLOR-CON ) 20 MEQ packet Take 40 mEq by mouth daily.      SYNTHROID  112 MCG tablet Take by mouth.     topiramate  (TOPAMAX ) 50 MG tablet Take 1 tablet (50 mg total) by mouth 2 (two) times daily. 60 tablet 3   traZODone  (DESYREL ) 50 MG tablet Take 0.5-1 tablets (25-50 mg total) by mouth at bedtime as needed. 90 tablet 1   No current facility-administered medications for this visit.    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   Complication of anesthesia    woke up during EGD   Dysphagia    Empty sella syndrome    Essential hypertension    Family history of breast cancer    Fibroids, intramural 02/17/2017   Gastroesophageal reflux disease    Generalized anxiety disorder    Graves disease 01/13/11   radioactive iodine  treatment, 14.6 millicuries   Hiatal hernia    small   History of chemotherapy    History of hysterectomy 02/17/2017   History of migraine headaches    History of radiation therapy    Iron deficiency anemia    Malignant neoplasm of upper-outer quadrant of left breast in female, 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   Meralgia paresthetica    Murmur    benign, h/o, caused by  hyperdynamic contraction   Obesity    Obstructive sleep apnea    Pneumonia 2017   inhaler prescribed   Postoperative hypothyroidism    Primary osteoarthritis of right foot 03/12/2018   Tendonitis of shoulder, right    Vitamin D deficiency     Family History  Problem Relation Age of Onset   Hypertension Mother    Alcoholism Father    CAD Maternal Grandfather    Breast cancer Paternal Grandmother    HIV Brother    Heart attack Brother        sudden MI vs PE   Breast cancer Sister 28   Breast cancer Other        one dx 35s, others dx in 38s   Alzheimer's disease Other    Colon polyps Neg Hx    Colon cancer Neg Hx    Liver disease Neg Hx     Social  History   Socioeconomic History   Marital status: Divorced    Spouse name: Not on file   Number of children: Not on file   Years of education: 63   Highest education level: Master's degree (e.g., MA, MS, MEng, MEd, MSW, MBA)  Occupational History   Not on file  Tobacco Use   Smoking status: Never   Smokeless tobacco: Never  Vaping Use   Vaping status: Never Used  Substance and Sexual Activity   Alcohol use: No   Drug use: No   Sexual activity: Not Currently    Birth control/protection: Abstinence  Other Topics Concern   Not on file  Social History Narrative   Not on file   Social Drivers of Health   Financial Resource Strain: Not on file  Food Insecurity: Not on file  Transportation Needs: Not on file  Physical Activity: Not on file  Stress: Not on file  Social Connections: Unknown (07/09/2021)   Received from [facility]   Social Network    Social Network: Not on file  Intimate Partner Violence: Unknown (05/31/2021)   Received from [facility]   HITS    Physically Hurt: Not on file    Insult or Talk Down To: Not on file    Threaten Physical Harm: Not on file    Scream or Curse: Not on file    Past Medical History, Surgical history, Social history, and Family history were reviewed and updated as  appropriate.   Please see review of systems for further details on the patient's review from today.   Objective:   Physical Exam:  LMP 02/13/2017 (Exact Date)   Physical Exam Constitutional:      General: She is not in acute distress. Musculoskeletal:        General: No deformity.  Neurological:     Mental Status: She is alert and oriented to person, place, and time.     Coordination: Coordination normal.  Psychiatric:        Attention and Perception: Attention and perception normal. She does not perceive auditory or visual hallucinations.        Mood and Affect: Mood normal. Mood is not anxious or depressed. Affect is not labile, blunt, angry or inappropriate.        Speech: Speech normal.        Behavior: Behavior normal.        Thought Content: Thought content normal. Thought content is not paranoid or delusional. Thought content does not include homicidal or suicidal ideation. Thought content does not include homicidal or suicidal plan.        Cognition and Memory: Cognition and memory normal.        Judgment: Judgment normal.     Comments: Insight intact     Lab Review:     Component Value Date/Time   NA 142 05/17/2021 1551   K 3.7 05/17/2021 1551   CL 103 05/17/2021 1551   CO2 33 (H) 05/17/2021 1551   GLUCOSE 97 05/17/2021 1551   BUN 14 05/17/2021 1551   CREATININE 0.84 05/17/2021 1551   CALCIUM 9.6 05/17/2021 1551   PROT 7.2 05/17/2021 1551   ALBUMIN 4.0 05/17/2021 1551   AST 18 05/17/2021 1551   ALT 16 05/17/2021 1551   ALKPHOS 75 05/17/2021 1551   BILITOT 0.3 05/17/2021 1551   GFRNONAA >60 05/17/2021 1551   GFRAA >60 10/24/2019 1323   GFRAA >60 04/10/2019 1240       Component Value Date/Time   WBC  6.2 05/17/2021 1551   WBC 5.3 11/23/2020 1140   RBC 4.28 05/17/2021 1551   HGB 11.5 (L) 05/17/2021 1551   HCT 36.1 05/17/2021 1551   PLT 187 05/17/2021 1551   MCV 84.3 05/17/2021 1551   MCH 26.9 05/17/2021 1551   MCHC 31.9 05/17/2021 1551   RDW 13.2  05/17/2021 1551   LYMPHSABS 1.8 05/17/2021 1551   MONOABS 0.5 05/17/2021 1551   EOSABS 0.3 05/17/2021 1551   BASOSABS 0.0 05/17/2021 1551    No results found for: POCLITH, LITHIUM   No results found for: PHENYTOIN, PHENOBARB, VALPROATE, CBMZ   .res Assessment: Plan:    Plan:  PDMP reviewed  Wellbutrin  XL 150mg  - 3 daily - denies seizure history. Pristiq  100mg  daily Trazadone 50mg  at hs   Will continue Concerta 27mg  every morning for ADD symptoms. Patient has completed several ADD screening assessments indicating an ADD diagnosis. Will add a low dose of Concerta for symptom management.   Reports mildly elevated BP - following up with PCP.  Therapist - Marval Bunde - seeing as needed  RTC 4 weeks  25 minutes spent dedicated to the care of this patient on the date of this encounter to include pre-visit review of records, ordering of medication, post visit documentation, and face-to-face time with the patient discussing ADD, MDD, GAD, panic attacks, insomnia, mixed obsessional thoughts. Discussed continuing current medication regimen.  Patient advised to contact office with any questions, adverse effects, or acute worsening in signs and symptoms.    There are no diagnoses linked to this encounter.   Please see After Visit Summary for patient specific instructions.  Future Appointments  Date Time Provider Department Center  01/08/2024 11:00 AM Daqwan Dougal, Angeline Mattocks, NP [facility] None  01/09/2024 10:00 AM Bunde Sober, LCSW [facility] None  01/16/2024  3:00 PM Cary No, NP [facility] None  01/23/2024 10:00 AM Bunde Sober, LCSW [facility] None  06/25/2024 11:15 AM Odean Potts, MD [facility] None    No orders of the defined types were placed in this encounter.     -------------------------------

## 2024-01-09 ENCOUNTER — Ambulatory Visit (INDEPENDENT_AMBULATORY_CARE_PROVIDER_SITE_OTHER): Admitting: Psychiatry

## 2024-01-09 DIAGNOSIS — F331 Major depressive disorder, recurrent, moderate: Secondary | ICD-10-CM

## 2024-01-09 NOTE — Progress Notes (Signed)
 [facility] Counselor/Therapist Progress Note  Patient ID: Loretta Nelson, MRN: [id number],    Date: 01/09/2024  Time Spent: 48 minutes   Treatment Type: Individual Therapy  Reported Symptoms: a little more depressed than anxious as I got turned down for job but am applying for another one; anxiety, still Concerta which is helping, depression some improved, less tearfuness, focus some better, not as overwhelmed. Feeling some better and applying for a couple jobs that interest her, and feeling some confidence in this. Better on her adhd medication.    Mental Status Exam:  Appearance:   Casual     Behavior:  Appropriate, Sharing, and Motivated  Motor:  Normal  Speech/Language:   Clear and Coherent  Affect:  Anxious, some depression  Mood:  anxious and depressed  Thought process:  goal directed  Thought content:    Rumination  Sensory/Perceptual disturbances:    WNL  Orientation:  oriented to person, place, time/date, situation, day of week, month of year, year, and stated date of Nov. 11, 2025  Attention:  Good  Concentration:  Good  Memory:  WNL  Fund of knowledge:   Good  Insight:    Good and Fair  Judgment:   Good  Impulse Control:  Good   Risk Assessment: Danger to Self:  No Self-injurious Behavior: No Danger to Others: No Duty to Warn:no Physical Aggression / Violence:No  Access to Firearms a concern: No  Gang Involvement:No   Subjective:   Patient in session today continuing to focus on her anxiety, continuing to improve her focus which has improved some, depression improving, and little tearfulness today. Applied for a job but did not get it, and is applying for another one that is totally virtual which she feels may be better for her. Today processing her disappointment re: turned down for recent job but is applying for others.  Worked in session today on her disappointment that also the strength that she is showing and she was able to name a lot of of her  strengths that she has for certain jobs for which she is applying.  Feels her medication is helping her.  Wants to work further on her relationship with her mother but needed the time today to process more of what was going on with patient herself and her confidence in seeking and applying for employment particularly in certain job areas.  Continues to set boundaries with mother and trying to better manage stress in family caregiving.  Also working to have less shoulds and letting go of some previous guilt regarding relationship with mother.  Reports that her focus is better and she is getting better about asking for help as needed.  Feeling that my head is more clear.  Remaining on her prescribed medications.  More contacts with friends, continued improvement in her self-talk to try and encourage herself, as well as practicing healthy habits.   Interventions: Cognitive Behavioral Therapy, Solution-Oriented/Positive Psychology, and Ego-Supportive Long-term goal: Reduce overall level, frequency, and intensity of the anxiety so that daily functioning is not impaired.  Short-term goal: Verbalize an understanding of the role that fearful thinking plays in creating fears, excessive worry, and persistent anxiety symptoms. Strategies: Strengthen her new non-avoiding approach and build confidence.     Diagnosis:   ICD-10-CM   1. Major depressive disorder, recurrent episode, moderate (HCC)  F33.1      Plan:   Patient working today in session further on her anxiety, noticing that her memory is better  and mood is also some better and continuing to be a work in progress.  Is feeling like she wants to apply for certain jobs and is already working on that.  Got turned down for 1 but is bouncing back and applying for another when there is little bit different but more geared for what patient feels her strengths are.  Little tearfulness today in session.  Able to see some of her strengths and positives and  talked about them in session today.  Continues working to interrupt negative thought patterns and feeling that she is improving.  Less brain fog and more task oriented.  Continues to look at what she can change versus what she cannot change and is trying to be more accepting of the ways that she feels she cannot change certain things.  Really good support system within her family and friends. Continue to encourage patient in her practice of more positive behaviors as noted in our session including: Practicing more self acceptance, use of appropriate boundaries and understand that she does not have to explain to everybody when they ask questions, not over personalizing situations and interactions, better management of anxiety and depression through use of strategies discussed in sessions, continue working on decreased perfectionistic tendencies, stay in the present focusing on what she can control or change versus cannot, keep contact with people who are supportive of her, get outside daily and walk, avoid assuming worst-case scenarios, look more for the positives versus negatives each day, healthy nutrition and exercise, use of daily affirmations, positive self-talk, reduce overthinking and overanalyzing, be involved in more things that she enjoys that are healthy for her, saying no when she needs to say no, allow her faith to be an emotional support as well as spiritual, and recognize the strengths she shows when she works with goal-directed behaviors that can help her move in a direction of better supporting her overall emotional health and wellbeing.  Goal review and progress/challenges noted with patient.  Next appointment within 2 to 3 weeks.   Barnie Bunde, LCSW

## 2024-01-15 NOTE — Progress Notes (Unsigned)
 PATIENT: Loretta Nelson DOB: 02/08/1966  REASON FOR VISIT: follow up HISTORY FROM: patient  No chief complaint on file.   HISTORY OF PRESENT ILLNESS:  01/15/24 ALL: Loretta Nelson returns for follow up for OSA on CPAP and meralgia paresthetica. She continues to do well on CPAP. She is using therapy nightly for about   Pain? Weight?     01/16/2023 ALL:  Loretta Nelson returns for follow up for OSA on CPA and meralgia paresthetica. She continues to do well. She is using therapy nightly for about 6 hours on average. She is tolerating therapy well. She does feel more tired. 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 She was seen by Dr Rosemarie 09/2022 and started on topiramate  50mg  BID for meralgia paresthetica. She feels it has really helped. She continues to work on [employer]. She has lost about 14lbs since last visit. About 40lbs over the past two years.     01/24/2022 ALL:  Loretta Nelson returns for follow up for OSA on CPAP. She is doing well. She has resigned from [employer] and now working for herself doing [employer], tumblers, invitations, etc. She is working with a veterinary surgeon. She feels mood is good. She is sleeping fairly well but admits that some nights she isn't getting as much sleep as she should. She does note benefit of using CPAP. She denies concerns with machine or supplies.     01/20/2021 ALL: Loretta Nelson returns for follow up for OSA on CPAP.   She completed neuropsych eval with Dr Richie 02/2020. Although she did have some deficits in learning and retrieval aspects of memory, all other domains were normal. Chemo treatments most likely contributor of deficits. She is doing fairly well. She is using CPAP nightly without concerns. She did have a power outage recently and was unable to use her machine. She did not sleep well that night. Memory is stable. She does continue to have word finding difficulty at times. 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 She is able to perform job duties.     01/22/2020 ALL:  Loretta Nelson is a 58 y.o. female here today for follow up for OSA on CPAP.  She is doing very well with CPAP therapy since last being seen.  She is using CPAP nightly.  She does report improved sleep quality when using CPAP.  She has had some intermittent insomnia since starting Arimidex .  She has completed chemo and radiation.  She is followed closely by primary care and oncology.  She is requesting a new referral for neurocognitive evaluation as previously discussed with Dr. Rosemarie. She does continue to have difficulty multitasking and concentration. She has word finding difficulty. She is able to work and maintain home. She can drive without difficulty and perform ADL's independently.   Compliance report dated 12/22/2019 through 01/20/2020 reveals that she used CPAP 30 of the past 30 for compliance of 100%.  She is CPAP greater than 4 hours 30 days for compliance of 100%.  Average usage was 7 hours and 58 minutes.  Residual AHI was 1 on a set pressure of 12 cm of water and EPR 3.  There was no significant leak noted.   HISTORY: (copied from Dr Obie previous note)  Ms. Michie is a 58 year old right-handed woman with an underlying medical history of hypertension, vitamin D deficiency, Graves' disease with status post radioactive iodine , reflux disease, arthritis, anxiety, anemia, allergic rhinitis, hypothyroidism, obesity with  a BMI of over 50, and recent diagnosis of L sided breast cancer with status post surgery in February and adjuvant chemotherapy since March, with adjuvant radiation therapy pending, who Presents for follow-up consultation of her obstructive sleep apnea after interim testing and starting CPAP therapy.  The patient is unaccompanied today.  I first met her at the request of Dr. Rosemarie on 02/27/2019, at which time she reported snoring and daytime somnolence and had cognitive complaints.  She was  advised to proceed with a sleep study.  She had a baseline sleep study, followed by a CPAP titration study.  Her baseline sleep study from 03/15/2019 showed severe obstructive sleep apnea with a total AHI of 57.4/hour, REM AHI of 98.6/hour, supine AHI of 53/hour and O2 nadir of 73%. REM latency was delayed and REM percentage markedly reduced at 4%.  She was advised to return for a CPAP titration study.  She had this on 04/15/2019.  She was fitted with a nasal mask but would prefer nasal pillows at home as she indicated at the time of her sleep study.  She was titrated from 6 cm to 14 cm.  On the final pressure, her AHI was 0.9/h with supine REM sleep achieved an O2 nadir at 90%.  She was advised to start CPAP therapy at home at a pressure of 14 cm.   She emailed at the end of March due to difficulty tolerating the pressure and I reduced it to 12 cm.    Today, 07/23/2019: I reviewed her CPAP compliance data from 06/22/2019 through 07/21/2019, which is a total of 30 days, during which time she used her machine every night with percent use days greater than 4 hours at 97%, indicating excellent compliance with an average usage of 6 hours, residual AHI at goal at 1.3/hour, Leak acceptable with a 95th percentile at 9.8 L/min on a pressure of 12 cm with EPR of 3.  She reports that she has had difficulty sleeping since starting chemotherapy. Especially the first 2 weeks after her chemotherapy cycle she is having a tough time. She was diagnosed with left-sided breast cancer in the middle of going through sleep study testing. She has 1 more chemotherapy cycle to go and will start adjuvant radiation therapy. She has a good support system, thankfully, both daughters have graduated college successfully. Her mother has moved him as well. She is motivated to continue with CPAP and tolerates the reduced pressure better. She is currently using a nasal cushion interface but also has a hybrid style fullface mask available. She has  struggled with mouth dryness and tries to hydrate better. She had to delay her neuropsychological evaluation at [facility] neurology because of her cancer treatment. She is planning to reschedule her appointment and then schedule a follow-up with Dr. Rosemarie.  REVIEW OF SYSTEMS: Out of a complete 14 system review of symptoms, the patient complains only of the following symptoms, inattention, lack of concentration, intermittent insomnia and all other reviewed systems are negative.  ESS: 10/24  ALLERGIES: Allergies  Allergen Reactions   Other     Other reaction(s): rash/red   Tape Itching and Other (See Comments)    Adhesive tape--redness/inflammation/itching    HOME MEDICATIONS: Outpatient Medications Prior to Visit  Medication Sig Dispense Refill   acetaminophen  (TYLENOL ) 500 MG tablet Take 2 tablets (1,000 mg total) by mouth every 8 (eight) hours as needed. (Patient not taking: Reported on 06/26/2023) 30 tablet 0   amLODipine  (NORVASC ) 5 MG tablet Take 5 mg  by mouth daily.   5   buPROPion  (WELLBUTRIN  XL) 150 MG 24 hr tablet Take three tablets every morning. 270 tablet 1   Cholecalciferol 4000 UNITS CAPS Take 4,000 Units by mouth daily.      desvenlafaxine  (PRISTIQ ) 100 MG 24 hr tablet Take 1 tablet (100 mg total) by mouth daily. 90 tablet 1   diclofenac Sodium (VOLTAREN) 1 % GEL Apply 1 application topically 4 (four) times daily as needed (knee pain.). (Patient not taking: Reported on 06/26/2023)     EDARBYCLOR 40-25 MG TABS Take 1 tablet by mouth daily.  5   iron polysaccharides (NIFEREX) 150 MG capsule Take 150 mg by mouth daily.     levothyroxine  (SYNTHROID ) 175 MCG tablet Take 175 mcg by mouth daily before breakfast.  (Patient not taking: Reported on 06/26/2023)  5   loratadine  (CLARITIN ) 10 MG tablet TAKE 1 TABLET BY MOUTH EVERY DAY 90 tablet 1   methylphenidate (CONCERTA) 27 MG PO CR tablet Take 1 tablet (27 mg total) by mouth every morning. 30 tablet 0   metoprolol  (TOPROL -XL) 200 MG 24  hr tablet Take 200 mg by mouth daily.   5   pantoprazole  (PROTONIX ) 40 MG tablet Take 40 mg by mouth 2 (two) times daily.      potassium chloride  (KLOR-CON ) 20 MEQ packet Take 40 mEq by mouth daily.      SYNTHROID  112 MCG tablet Take by mouth.     topiramate  (TOPAMAX ) 50 MG tablet Take 1 tablet (50 mg total) by mouth 2 (two) times daily. 60 tablet 3   traZODone  (DESYREL ) 50 MG tablet Take 0.5-1 tablets (25-50 mg total) by mouth at bedtime as needed. 90 tablet 1   No facility-administered medications prior to visit.    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   Complication of anesthesia    woke up during EGD   Dysphagia    Empty sella syndrome    Essential hypertension    Family history of breast cancer    Fibroids, intramural 02/17/2017   Gastroesophageal reflux disease    Generalized anxiety disorder    Graves disease 01/13/11   radioactive iodine  treatment, 14.6 millicuries   Hiatal hernia    small   History of chemotherapy    History of hysterectomy 02/17/2017   History of migraine headaches    History of radiation therapy    Iron deficiency anemia    Malignant neoplasm of upper-outer quadrant of left breast in female, 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   Meralgia paresthetica    Murmur    benign, h/o, caused by hyperdynamic contraction   Obesity    Obstructive sleep apnea    Pneumonia 2017   inhaler prescribed   Postoperative hypothyroidism    Primary osteoarthritis of right foot 03/12/2018   Tendonitis of shoulder, right    Vitamin D deficiency     PAST SURGICAL HISTORY: Past Surgical History:  Procedure Laterality Date   ABDOMINAL HYSTERECTOMY  2018   BALLOON DILATION N/A 09/07/2016   Procedure: BALLOON DILATION;  Surgeon: Burnette Fallow, MD;  Location: [facility];  Service: Endoscopy;  Laterality: N/A;   BREAST LUMPECTOMY Left 03/2019   BREAST LUMPECTOMY WITH RADIOACTIVE  SEED AND SENTINEL LYMPH NODE BIOPSY Left 04/17/2019   Procedure: LEFT BREAST LUMPECTOMY WITH RADIOACTIVE SEED AND SENTINEL LYMPH NODE BIOPSY;  Surgeon: Belinda Cough, MD;  Location: [facility] OR;  Service: General;  Laterality: Left;  LMA, PEC BLOCK   CESAREAN SECTION  1999   COLONOSCOPY     DILATION AND CURETTAGE OF UTERUS     ESOPHAGOGASTRODUODENOSCOPY     ESOPHAGOGASTRODUODENOSCOPY (EGD) WITH PROPOFOL  N/A 09/07/2016   Procedure: ESOPHAGOGASTRODUODENOSCOPY (EGD) WITH PROPOFOL ;  Surgeon: Burnette Fallow, MD;  Location: [facility] ENDOSCOPY;  Service: Endoscopy;  Laterality: N/A;   HYSTERECTOMY ABDOMINAL WITH SALPINGECTOMY Bilateral 02/17/2017   Procedure: HYSTERECTOMY ABDOMINAL WITH SALPINGECTOMY;  Surgeon: Rosalva Sawyer, MD;  Location: [facility] ORS;  Service: Gynecology;  Laterality: Bilateral;   KNEE ARTHROSCOPY     for torn meniscus   LAPAROSCOPIC SALPINGO OOPHERECTOMY N/A 02/12/2020   Procedure: DIAGNOSTIC LAPAROSCOPY ;  Surgeon: Rosalva Sawyer, MD;  Location: [facility] OR;  Service: Gynecology;  Laterality: N/A;   LAPAROTOMY Bilateral 02/12/2020   Procedure: EXPLORATORY LAPAROTOMY WITH BILATERAL SALPINGO OOPHORECTOMY;  Surgeon: Rosalva Sawyer, MD;  Location: [facility] OR;  Service: Gynecology;  Laterality: Bilateral;   LYSIS OF ADHESION N/A 02/12/2020   Procedure: LYSIS OF ADHESION;  Surgeon: Rosalva Sawyer, MD;  Location: [facility] OR;  Service: Gynecology;  Laterality: N/A;   PORT-A-CATH REMOVAL Right 01/07/2020   Procedure: REMOVAL PORT-A-CATH;  Surgeon: Belinda Cough, MD;  Location: [facility] OR;  Service: General;  Laterality: Right;   PORTACATH PLACEMENT Right 05/22/2019   Procedure: INSERTION PORT-A-CATH WITH ULTRASOUND GUIDANCE;  Surgeon: Belinda Cough, MD;  Location: [facility] ORS;  Service: General;  Laterality: Right;  Endotrachial tube   WISDOM TOOTH EXTRACTION     WISDOM TOOTH EXTRACTION      FAMILY HISTORY: Family History  Problem Relation Age of Onset   Hypertension Mother    Alcoholism Father    CAD Maternal Grandfather    Breast  cancer Paternal Grandmother    HIV Brother    Heart attack Brother        sudden MI vs PE   Breast cancer Sister 70   Breast cancer Other        one dx 64s, others dx in 39s   Alzheimer's disease Other    Colon polyps Neg Hx    Colon cancer Neg Hx    Liver disease Neg Hx     SOCIAL HISTORY: Social History   Socioeconomic History   Marital status: Divorced    Spouse name: Not on file   Number of children: Not on file   Years of education: 18   Highest education level: Master's degree (e.g., MA, MS, MEng, MEd, MSW, MBA)  Occupational History   Not on file  Tobacco Use   Smoking status: Never   Smokeless tobacco: Never  Vaping Use   Vaping status: Never Used  Substance and Sexual Activity   Alcohol use: No   Drug use: No   Sexual activity: Not Currently    Birth control/protection: Abstinence  Other Topics Concern   Not on file  Social History Narrative   Not on file   Social Drivers of Health   Financial Resource Strain: Not on file  Food Insecurity: Not on file  Transportation Needs: Not on file  Physical Activity: Not on file  Stress: Not on file  Social Connections: Unknown (07/09/2021)   Received from [facility]   Social Network    Social Network: Not on file  Intimate Partner Violence: Unknown (05/31/2021)   Received from [facility]   HITS    Physically Hurt: Not on file    Insult or Talk Down To: Not on file    Threaten Physical Harm: Not on file    Scream  or Curse: Not on file     PHYSICAL EXAM  There were no vitals filed for this visit.     There is no height or weight on file to calculate BMI.  Generalized: Well developed, in no acute distress  Cardiology: normal rate and rhythm, no murmur noted Respiratory: clear to auscultation bilaterally  Neurological examination  Mentation: Alert oriented to time, place, history taking. Follows all commands speech and language fluent Cranial nerve II-XII: Pupils were equal round reactive to  light. Extraocular movements were full, visual field were full  Motor: The motor testing reveals 5 over 5 strength of all 4 extremities. Good symmetric motor tone is noted throughout.  Gait and station: Gait is normal.    DIAGNOSTIC DATA (LABS, IMAGING, TESTING) - I reviewed patient records, labs, notes, testing and imaging myself where available.     01/15/2019    3:40 PM  MMSE - Mini Mental State Exam  Orientation to time 5  Orientation to Place 5  Registration 3  Attention/ Calculation 5  Recall 3  Language- name 2 objects 2  Language- repeat 1  Language- follow 3 step command 3  Language- read & follow direction 1  Write a sentence 1  Copy design 1  Total score 30     Lab Results  Component Value Date   WBC 6.2 05/17/2021   HGB 11.5 (L) 05/17/2021   HCT 36.1 05/17/2021   MCV 84.3 05/17/2021   PLT 187 05/17/2021      Component Value Date/Time   NA 142 05/17/2021 1551   K 3.7 05/17/2021 1551   CL 103 05/17/2021 1551   CO2 33 (H) 05/17/2021 1551   GLUCOSE 97 05/17/2021 1551   BUN 14 05/17/2021 1551   CREATININE 0.84 05/17/2021 1551   CALCIUM 9.6 05/17/2021 1551   PROT 7.2 05/17/2021 1551   ALBUMIN 4.0 05/17/2021 1551   AST 18 05/17/2021 1551   ALT 16 05/17/2021 1551   ALKPHOS 75 05/17/2021 1551   BILITOT 0.3 05/17/2021 1551   GFRNONAA >60 05/17/2021 1551   GFRAA >60 10/24/2019 1323   GFRAA >60 04/10/2019 1240   No results found for: CHOL, HDL, LDLCALC, LDLDIRECT, TRIG, CHOLHDL No results found for: YHAJ8R Lab Results  Component Value Date   VITAMINB12 256 01/15/2019   No results found for: TSH   ASSESSMENT AND PLAN 58 y.o. year old female  has a past medical history of Allergic rhinitis, BARD1 gene mutation positive (04/19/2019), Complication of anesthesia, Dysphagia, Empty sella syndrome, Essential hypertension, Family history of breast cancer, Fibroids, intramural (02/17/2017), Gastroesophageal reflux disease, Generalized anxiety  disorder, Graves disease (01/13/11), Hiatal hernia, History of chemotherapy, History of hysterectomy (02/17/2017), History of migraine headaches, History of radiation therapy, Iron deficiency anemia, Malignant neoplasm of upper-outer quadrant of left breast in female, estrogen receptor positive (04/05/2019), Meralgia paresthetica, Murmur, Obesity, Obstructive sleep apnea, Pneumonia (2017), Postoperative hypothyroidism, Primary osteoarthritis of right foot (03/12/2018), Tendonitis of shoulder, right, and Vitamin D deficiency. here with   No diagnosis found.   Renie A Demonbreun is doing well on CPAP therapy. Compliance report reveals excellent compliance. She was encouraged to continue using CPAP nightly and for greater than 4 hours each night. We will update supply orders as indicated. Risks of untreated sleep apnea review and education materials provided. Continue topiramate  50mg  BID.  She will continue memory compensation strategies. Healthy lifestyle habits encouraged. She will follow up in 1 year, sooner if needed. She verbalizes understanding and agreement with this plan.  No orders of the defined types were placed in this encounter.     No orders of the defined types were placed in this encounter.      Greig Forbes, FNP-C 01/15/2024, 8:58 AM [facility] [address] [place], [place] [address] [phone]

## 2024-01-15 NOTE — Progress Notes (Unsigned)
 Loretta Nelson

## 2024-01-15 NOTE — Patient Instructions (Incomplete)

## 2024-01-16 ENCOUNTER — Ambulatory Visit (INDEPENDENT_AMBULATORY_CARE_PROVIDER_SITE_OTHER): Payer: Managed Care, Other (non HMO) | Admitting: Family Medicine

## 2024-01-16 ENCOUNTER — Encounter: Payer: Self-pay | Admitting: Family Medicine

## 2024-01-16 VITALS — BP 126/78 | HR 106 | Ht 65.0 in

## 2024-01-16 DIAGNOSIS — M79609 Pain in unspecified limb: Secondary | ICD-10-CM | POA: Diagnosis not present

## 2024-01-16 DIAGNOSIS — G3184 Mild cognitive impairment, so stated: Secondary | ICD-10-CM | POA: Diagnosis not present

## 2024-01-16 DIAGNOSIS — G4733 Obstructive sleep apnea (adult) (pediatric): Secondary | ICD-10-CM | POA: Diagnosis not present

## 2024-01-16 DIAGNOSIS — G5711 Meralgia paresthetica, right lower limb: Secondary | ICD-10-CM | POA: Diagnosis not present

## 2024-01-16 DIAGNOSIS — R202 Paresthesia of skin: Secondary | ICD-10-CM

## 2024-01-23 ENCOUNTER — Ambulatory Visit (INDEPENDENT_AMBULATORY_CARE_PROVIDER_SITE_OTHER): Admitting: Psychiatry

## 2024-01-23 DIAGNOSIS — F411 Generalized anxiety disorder: Secondary | ICD-10-CM | POA: Diagnosis not present

## 2024-01-23 NOTE — Progress Notes (Signed)
 [facility] Counselor/Therapist Progress Note  Patient ID: Loretta Nelson, MRN: [id number],    Date: 01/23/2024  Time Spent: 53 minutes   Treatment Type: Individual Therapy  Reported Symptoms: anxiety stronger than depression currently but can change, symptoms less overwhelming, extended family issues are challenging and involving her great niece with whom patient is close but relationship can be challenging and patient needed to discuss that relationship more today. (Not all details included in this note due to patient privacy needs.) Feels med is helping - Concerta . Much less tearfulness, focusing better, less depression, not as overwhelmed. Applying for jobs of interest and hoping to get something soon.   Mental Status Exam:  Appearance:   Casual and Neat     Behavior:  Appropriate, Sharing, and Motivated  Motor:  Normal  Speech/Language:   Clear and Coherent  Affect:  Depressed and anxious  Mood:  anxious and depressed  Thought process:  goal directed  Thought content:    WNL  Sensory/Perceptual disturbances:    WNL  Orientation:  oriented to person, place, time/date, situation, day of week, month of year, year, and stated date of Nov. 25, 2025  Attention:  Fair  Concentration:  Fair  Memory:  WNL  Fund of knowledge:   Good  Insight:    Good and Fair  Judgment:   Good  Impulse Control:  Good   Risk Assessment: Danger to Self:  No Self-injurious Behavior: No Danger to Others: No Duty to Warn:no Physical Aggression / Violence:No  Access to Firearms a concern: No  Gang Involvement:No   Subjective: Patient today working in session further on her anxiety, depression (improving), and improving her focus gradually. Still looking for job. Has not heard back from jobs that she has applied for so far. 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 Feels her anxiety and depression both linked with difficulties personally and professionally. Interested in  job re: homeless education and support and trying to follow through on this. Noticeably feeling better and more confident in herself today although still wrestling with some self-doubt, but is showing progress as well.  Does feel the medication she is on is definitely helping.  Still wanting to eventually work further on the relationship with her mother but not quite ready yet for that.  Is setting some needed boundaries with mother.  Stress management seems to be getting some better.  Encouraging patient to continue working on letting go of prior guilt, some of which relates to relationship with her mother and others.  Patient stated again today that she feels her head is more clear, and her talking and behavior seems to reflect some improved self-confidence and self-esteem.  Feeling some more peace at times and that her head is more clear, particularly as she engages in contact within the family and some contact with friends.  Does feel that she will be able to go back to work and just looking for what feels like a good match for her.    Interventions: Cognitive Behavioral Therapy, Solution-Oriented/Positive Psychology, and Ego-Supportive Long-term goal: Reduce overall level, frequency, and intensity of the anxiety so that daily functioning is not impaired.  Short-term goal: Verbalize an understanding of the role that fearful thinking plays in creating fears, excessive worry, and persistent anxiety symptoms. Strategies:  Strengthen her new non-avoiding approach and build confidence.     Diagnosis:   ICD-10-CM   1. Generalized anxiety disorder  F41.1      Plan:  Patient working well in session today focusing more on her anxiety, decisions about applying for a job of interest to her, depression, family ties (some positives and some not as positive).  Showing more motivation and less depression, with some heightening of her anxiety which she feels she is managing rather well.  Very little  tearfulness today in session.  Definitely able to see more of her positives and strengths and reports decreased brain fog.  Is continuing to try and accept the things she cannot change and see more of the things that she can and wants to change. One advantage that she has is that her family and friends offer a good support system for her, and have continue to support her in ways that are meaningful for patient.  Supported and encouraged patient in her practice of more positive behaviors as noted in our session today including: Practiced the use of more self-acceptance, use of appropriate boundaries and understand that she does not have to explain to everybody when they ask questions, not over personalizing situations and interactions, better management of anxiety and depression through use of strategies discussed in sessions, continue working on decreased perfectionistic tendencies, stay in the present focusing on what she can control or change versus cannot, maintain contact with people who are supportive of her, get outside daily and walk, avoid assuming worst-case scenarios, look more for the positives versus negatives each day, healthy nutrition and exercise, use of daily affirmations, positive self-talk, reduce overthinking and overanalyzing, be involved in more things that she enjoys that are healthy for her, saying no when she needs to say no, allow her faith to be an emotional support as well as spiritual, and recognize the strength she shows when she works with goal-directed behaviors that can help her move in the direction of better supporting her overall emotional health and wellbeing into the future.  Goal review and progress/challenges noted with patient.  Next appointment within 2 to 3 weeks.   Barnie Bunde, LCSW

## 2024-01-30 ENCOUNTER — Telehealth: Payer: Self-pay | Admitting: Family Medicine

## 2024-01-30 NOTE — Telephone Encounter (Signed)
 [facility] pending

## 2024-01-31 NOTE — Telephone Encounter (Signed)
 [facility] blue no auth req via fax.

## 2024-02-05 ENCOUNTER — Telehealth: Admitting: Adult Health

## 2024-02-05 ENCOUNTER — Encounter: Payer: Self-pay | Admitting: Adult Health

## 2024-02-05 DIAGNOSIS — G4700 Insomnia, unspecified: Secondary | ICD-10-CM | POA: Diagnosis not present

## 2024-02-05 DIAGNOSIS — F410 Panic disorder [episodic paroxysmal anxiety] without agoraphobia: Secondary | ICD-10-CM | POA: Diagnosis not present

## 2024-02-05 DIAGNOSIS — F411 Generalized anxiety disorder: Secondary | ICD-10-CM

## 2024-02-05 DIAGNOSIS — F902 Attention-deficit hyperactivity disorder, combined type: Secondary | ICD-10-CM

## 2024-02-05 DIAGNOSIS — F422 Mixed obsessional thoughts and acts: Secondary | ICD-10-CM

## 2024-02-05 DIAGNOSIS — F331 Major depressive disorder, recurrent, moderate: Secondary | ICD-10-CM

## 2024-02-05 MED ORDER — METHYLPHENIDATE HCL ER (OSM) 36 MG PO TBCR: 36.0000 mg | EXTENDED_RELEASE_TABLET | ORAL | 0 refills | Status: DC

## 2024-02-05 NOTE — Progress Notes (Signed)
 Loretta Nelson [id number] 05-27-1965 58 y.o.  Virtual Visit via Video Note  I connected with pt @ on 02/05/24 at 11:30 AM EST by a video enabled telemedicine application and verified that I am speaking with the correct person using two identifiers.   I discussed the limitations of evaluation and management by telemedicine and the availability of in person appointments. The patient expressed understanding and agreed to proceed.  I discussed the assessment and treatment plan with the patient. The patient was provided an opportunity to ask questions and all were answered. The patient agreed with the plan and demonstrated an understanding of the instructions.   The patient was advised to call back or seek an in-person evaluation if the symptoms worsen or if the condition fails to improve as anticipated.  I provided 25 minutes of non-face-to-face time during this encounter.  The patient was located at home.  The provider was located at [facility].   Loretta LOISE Sayers, NP   Subjective:   Patient ID:  Loretta Nelson is a 58 y.o. (DOB 12-20-1965) female.  Chief Complaint: No chief complaint on file.   HPI Loretta Nelson presents for follow-up of ADD, MDD, GAD, panic attacks, insomnia, mixed obsessional thoughts.  Describes mood today as about the same. Pleasant. Reports tearfulness at times - it's off and on. Mood symptoms - reports some depression - going with the flow, honestly. Reports decreased anxiety it's still there, but better controlled. Reports irritability - in some situations. Reports feeling overwhelmed - not as much. Stating I have a lot going on, but I know I will get through it. Reports feeling less distracted. Reports improved interest and motivation - I want to do things. Denies recent panic attacks. Denies decreased intrusive thoughts. Reports decreased worry, rumination, and over thinking. Reports mood is variable - it's up and down. Stating  I feel like I'm doing better overall - still up and down and still situational. Feels like current medication regimen is helpful. Taking medications as prescribed. Energy levels have improved - PCP working on thyroid  and BP meds. Active, does not have a regular exercise routine. Enjoys some usual interests and activities. Divorced. Mother living with her since 44 - serves as her care giver. Has 2 daughters at home. Appetite adequate. Weight stable - 255 pounds.    Sleeping well most nights. Averages 7 to 8 hours with Trazadone.   Reports focus and concentration has improved with the addition of Concerta  - would like to increase dose. Managing some aspects of household. 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 Denies SI or HI.  Denies AH or VH. Denies self harm. Denies substance use.  Previous medication trials:  Celexa  Rexulti   Review of Systems:  Review of Systems  Musculoskeletal:  Negative for gait problem.  Neurological:  Negative for tremors.  Psychiatric/Behavioral:         Please refer to HPI    Medications: I have reviewed the patient's current medications.  Current Outpatient Medications  Medication Sig Dispense Refill   acetaminophen  (TYLENOL ) 500 MG tablet Take 2 tablets (1,000 mg total) by mouth every 8 (eight) hours as needed. 30 tablet 0   amLODipine  (NORVASC ) 5 MG tablet Take 5 mg by mouth daily.   5   buPROPion  (WELLBUTRIN  XL) 150 MG 24 hr tablet Take three tablets every morning. 270 tablet 1   Cholecalciferol 4000 UNITS CAPS Take 4,000 Units by mouth daily.      desvenlafaxine  (  PRISTIQ ) 100 MG 24 hr tablet Take 1 tablet (100 mg total) by mouth daily. 90 tablet 1   diclofenac Sodium (VOLTAREN) 1 % GEL Apply 1 application topically 4 (four) times daily as needed (knee pain.).     EDARBYCLOR 40-25 MG TABS Take 1 tablet by mouth daily.  5   iron polysaccharides (NIFEREX) 150 MG capsule Take 150 mg by mouth daily.      loratadine  (CLARITIN ) 10 MG tablet TAKE 1 TABLET BY MOUTH EVERY DAY 90 tablet 1   methylphenidate  (CONCERTA ) 36 MG PO CR tablet Take 1 tablet (36 mg total) by mouth every morning. 30 tablet 0   metoprolol  (TOPROL -XL) 200 MG 24 hr tablet Take 200 mg by mouth daily.   5   pantoprazole  (PROTONIX ) 40 MG tablet Take 40 mg by mouth 2 (two) times daily.      potassium chloride  (KLOR-CON ) 20 MEQ packet Take 40 mEq by mouth daily.  (Patient taking differently: Take 60 mEq by mouth daily.)     SYNTHROID  112 MCG tablet Take by mouth.     topiramate  (TOPAMAX ) 50 MG tablet Take 1 tablet (50 mg total) by mouth 2 (two) times daily. 60 tablet 3   traZODone  (DESYREL ) 50 MG tablet Take 0.5-1 tablets (25-50 mg total) by mouth at bedtime as needed. 90 tablet 1   No current facility-administered medications for this visit.    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   Complication of anesthesia    woke up during EGD   Dysphagia    Empty sella syndrome    Essential hypertension    Family history of breast cancer    Fibroids, intramural 02/17/2017   Gastroesophageal reflux disease    Generalized anxiety disorder    Graves disease 01/13/11   radioactive iodine  treatment, 14.6 millicuries   Hiatal hernia    small   History of chemotherapy    History of hysterectomy 02/17/2017   History of migraine headaches    History of radiation therapy    Iron deficiency anemia    Malignant neoplasm of upper-outer quadrant of left breast in female, 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   Meralgia paresthetica    Murmur    benign, h/o, caused by hyperdynamic contraction   Obesity    Obstructive sleep apnea    Pneumonia 2017   inhaler prescribed    Postoperative hypothyroidism    Primary osteoarthritis of right foot 03/12/2018   Tendonitis of shoulder, right    Vitamin D deficiency     Family History  Problem Relation Age of Onset   Hypertension Mother    Alcoholism Father    CAD Maternal Grandfather    Breast cancer Paternal Grandmother    HIV Brother    Heart attack Brother        sudden MI vs PE   Breast cancer Sister 51   Breast cancer Other        one dx 17s, others dx in 42s   Alzheimer's disease Other    Colon polyps Neg Hx    Colon cancer Neg Hx    Liver disease Neg Hx     Social History   Socioeconomic History   Marital status: Divorced  Spouse name: Not on file   Number of children: Not on file   Years of education: 30   Highest education level: Master's degree (e.g., MA, MS, MEng, MEd, MSW, MBA)  Occupational History   Not on file  Tobacco Use   Smoking status: Never   Smokeless tobacco: Never  Vaping Use   Vaping status: Never Used  Substance and Sexual Activity   Alcohol use: No   Drug use: No   Sexual activity: Not Currently    Birth control/protection: Abstinence  Other Topics Concern   Not on file  Social History Narrative   Not on file   Social Drivers of Health   Financial Resource Strain: Not on file  Food Insecurity: Not on file  Transportation Needs: Not on file  Physical Activity: Not on file  Stress: Not on file  Social Connections: Unknown (07/09/2021)   Received from [facility]   Social Network    Social Network: Not on file  Intimate Partner Violence: Unknown (05/31/2021)   Received from [facility]   HITS    Physically Hurt: Not on file    Insult or Talk Down To: Not on file    Threaten Physical Harm: Not on file    Scream or Curse: Not on file    Past Medical History, Surgical history, Social history, and Family history were reviewed and updated as appropriate.   Please see review of systems for further details on the patient's review from today.    Objective:   Physical Exam:  LMP 02/13/2017 (Exact Date)   Physical Exam Constitutional:      General: She is not in acute distress. Musculoskeletal:        General: No deformity.  Neurological:     Mental Status: She is alert and oriented to person, place, and time.     Coordination: Coordination normal.  Psychiatric:        Attention and Perception: Attention and perception normal. She does not perceive auditory or visual hallucinations.        Mood and Affect: Mood normal. Mood is not anxious or depressed. Affect is not labile, blunt, angry or inappropriate.        Speech: Speech normal.        Behavior: Behavior normal.        Thought Content: Thought content normal. Thought content is not paranoid or delusional. Thought content does not include homicidal or suicidal ideation. Thought content does not include homicidal or suicidal plan.        Cognition and Memory: Cognition and memory normal.        Judgment: Judgment normal.     Comments: Insight intact     Lab Review:     Component Value Date/Time   NA 142 05/17/2021 1551   K 3.7 05/17/2021 1551   CL 103 05/17/2021 1551   CO2 33 (H) 05/17/2021 1551   GLUCOSE 97 05/17/2021 1551   BUN 14 05/17/2021 1551   CREATININE 0.84 05/17/2021 1551   CALCIUM 9.6 05/17/2021 1551   PROT 7.2 05/17/2021 1551   ALBUMIN 4.0 05/17/2021 1551   AST 18 05/17/2021 1551   ALT 16 05/17/2021 1551   ALKPHOS 75 05/17/2021 1551   BILITOT 0.3 05/17/2021 1551   GFRNONAA >60 05/17/2021 1551   GFRAA >60 10/24/2019 1323   GFRAA >60 04/10/2019 1240       Component Value Date/Time   WBC 6.2 05/17/2021 1551   WBC 5.3 11/23/2020 1140   RBC 4.28  05/17/2021 1551   HGB 11.5 (L) 05/17/2021 1551   HCT 36.1 05/17/2021 1551   PLT 187 05/17/2021 1551   MCV 84.3 05/17/2021 1551   MCH 26.9 05/17/2021 1551   MCHC 31.9 05/17/2021 1551   RDW 13.2 05/17/2021 1551   LYMPHSABS 1.8 05/17/2021 1551   MONOABS 0.5 05/17/2021 1551   EOSABS 0.3 05/17/2021  1551   BASOSABS 0.0 05/17/2021 1551    No results found for: POCLITH, LITHIUM   No results found for: PHENYTOIN, PHENOBARB, VALPROATE, CBMZ   .res Assessment: Plan:    Plan:  PDMP reviewed  Wellbutrin  XL 150mg  - 3 daily - denies seizure history. Pristiq  100mg  daily Trazadone 50mg  at hs   Will increase Concerta  27mg  to 36mg  every morning for ADD symptoms. Patient has completed several ADD screening assessments indicating an ADD diagnosis.   Reports BP regularly - following up with PCP.  Therapist - Marval Bunde - seeing as needed  RTC 4 weeks  25 minutes spent dedicated to the care of this patient on the date of this encounter to include pre-visit review of records, ordering of medication, post visit documentation, and face-to-face time with the patient discussing ADD, MDD, GAD, panic attacks, insomnia, mixed obsessional thoughts. Discussed increasing Concerta  dose from 27mg  to 36mg  daily.  Patient advised to contact office with any questions, adverse effects, or acute worsening in signs and symptoms.    Diagnoses and all orders for this visit:  Attention deficit hyperactivity disorder (ADHD), combined type -     methylphenidate  (CONCERTA ) 36 MG PO CR tablet; Take 1 tablet (36 mg total) by mouth every morning.     Please see After Visit Summary for patient specific instructions.  Future Appointments  Date Time Provider Department Center  02/06/2024 11:00 AM Bunde Sober, LCSW [facility] None  02/27/2024 12:00 PM Bunde Sober, LCSW [facility] None  06/25/2024 11:15 AM Odean Potts, MD [facility] None    No orders of the defined types were placed in this encounter.     -------------------------------

## 2024-02-06 ENCOUNTER — Ambulatory Visit: Admitting: Psychiatry

## 2024-02-06 DIAGNOSIS — F411 Generalized anxiety disorder: Secondary | ICD-10-CM

## 2024-02-06 NOTE — Progress Notes (Signed)
 [facility] Counselor/Therapist Progress Note  Patient ID: TWINKLE SOCKWELL, MRN: [id number],    Date: 02/06/2024  Time Spent: 50 minutes   Treatment Type: Individual Therapy  Reported Symptoms: anxiety, some depression, less overwhelmed, family issues, financial issues but better, issues with great niece improved, less tearfulness, focusing better, meds helping more    Mental Status Exam:  Appearance:   Casual     Behavior:  Appropriate, Sharing, and Motivated  Motor:  Normal  Speech/Language:   Clear and Coherent  Affect:  Anxious, less depression  Mood:  anxious and depressed  Thought process:  normal  Thought content:    WNL  Sensory/Perceptual disturbances:    WNL  Orientation:  oriented to person, place, time/date, situation, day of week, month of year, year, and stated date of Dec. 9, 2025  Attention:  Good  Concentration:  Good  Memory:  WNL  Fund of knowledge:   Good  Insight:    Good  Judgment:   Good  Impulse Control:  Good   Risk Assessment: Danger to Self:  No Self-injurious Behavior: No Danger to Others: No Duty to Warn:no Physical Aggression / Violence:No  Access to Firearms a concern: No  Gang Involvement:No   Subjective:  Patient today trying to manage her stress related to potential job opportunities and really needed session to work on her stress, anxiety, improving depression and focus gradually, and feeling some better about herself. Is looking for job and currently has some possibilities that she is considering and has applied for. Feeling more upbeat in some ways and more hopeful about job opportunities and her mental/emotional health in which she is feeling more encouraged. Feels I am dealing with stress and things so much better as I have talked to myself and realize I'm just as good as anyone else. More confident in herself and to continue working on goals reviewed today, building on the progress she has made. Less self-doubt. Smiling  more.  Decrease in some of her fearful thoughts.  Feeling more encouraged.  She is realizing that I am managing stress better.  Her talking and behavior today definitely reflecting more self-confidence, higher self-esteem, and belief in herself but also states this is all happened pretty quickly and she knows that she needs to continue to work on some issues to get better and stay better.  At times, feels more peace and that her head is more clear.  Review of her goals and to continue working with them as they are a working progress.   Interventions: Cognitive Behavioral Therapy, Solution-Oriented/Positive Psychology, and Ego-Supportive Long-term goal: Reduce overall level, frequency, and intensity of the anxiety so that daily functioning is not impaired.  Short-term goal: Verbalize an understanding of the role that fearful thinking plays in creating fears, excessive worry, and persistent anxiety symptoms. Strategies:  Strengthen her new non-avoiding approach and build confidence.     Diagnosis:   ICD-10-CM   1. Generalized anxiety disorder  F41.1      Plan: Patient doing some really good work in session today as she focus more on her anxiety, discernment related to job search issues, family relationships, some depression but it is improved, and improve motivation.  Able to see her positives and strengths in addition to her challenges.  Trying to except the things she cannot change and see more things that she wants to change, but also being patient with herself in the meantime.  Not currently having any brain fog.  Has a  really good support system involving people that continue to support her through good times and challenging times.  Definitely feeling more positive today due to recent development regarding potential employment. Therapist continues to support and encourage patient and practicing more positive behaviors as noted on her sessions including today: Practicing the use of more  self-acceptance, use of appropriate boundaries and understand that she does not have to explain to everybody when they ask questions, not over personalizing situations and interactions, better management of anxiety and depression through the use of strategies discussed in sessions, continue working on decreased perfectionistic tendencies, stay in the present focusing on what she can control or change versus cannot, maintain contact with people who are supportive of her, get outside daily and walk, avoid assuming worst-case scenarios, look more for the positives versus negatives daily, healthy nutrition and exercise, use of daily affirmations, positive self-talk, reduce overthinking and overanalyzing, be involved in more things that she enjoys that are healthy for her as discussed in session, saying no when she needs to say no, allow her faith to be an emotional support as well as spiritual, and realize the strength she shows when she works with goal-directed behaviors that can help her move in the direction of better supporting her overall emotional health and wellbeing going forward.  Goal review and progress/challenges noted with patient.  Next appointment within 2 to 3 weeks.   Barnie Bunde, LCSW

## 2024-02-27 ENCOUNTER — Ambulatory Visit: Admitting: Psychiatry

## 2024-03-04 ENCOUNTER — Encounter: Payer: Self-pay | Admitting: Adult Health

## 2024-03-04 ENCOUNTER — Telehealth (INDEPENDENT_AMBULATORY_CARE_PROVIDER_SITE_OTHER): Admitting: Adult Health

## 2024-03-04 DIAGNOSIS — G4700 Insomnia, unspecified: Secondary | ICD-10-CM

## 2024-03-04 DIAGNOSIS — F329 Major depressive disorder, single episode, unspecified: Secondary | ICD-10-CM | POA: Diagnosis not present

## 2024-03-04 DIAGNOSIS — F902 Attention-deficit hyperactivity disorder, combined type: Secondary | ICD-10-CM

## 2024-03-04 DIAGNOSIS — F411 Generalized anxiety disorder: Secondary | ICD-10-CM

## 2024-03-04 DIAGNOSIS — F331 Major depressive disorder, recurrent, moderate: Secondary | ICD-10-CM

## 2024-03-04 DIAGNOSIS — F410 Panic disorder [episodic paroxysmal anxiety] without agoraphobia: Secondary | ICD-10-CM

## 2024-03-04 DIAGNOSIS — F909 Attention-deficit hyperactivity disorder, unspecified type: Secondary | ICD-10-CM

## 2024-03-04 DIAGNOSIS — F422 Mixed obsessional thoughts and acts: Secondary | ICD-10-CM

## 2024-03-04 MED ORDER — METHYLPHENIDATE HCL ER (OSM) 36 MG PO TBCR: 36.0000 mg | EXTENDED_RELEASE_TABLET | Freq: Every day | ORAL | 0 refills | Status: AC

## 2024-03-04 MED ORDER — METHYLPHENIDATE HCL ER (OSM) 36 MG PO TBCR: 36.0000 mg | EXTENDED_RELEASE_TABLET | ORAL | 0 refills | Status: AC

## 2024-03-04 NOTE — Progress Notes (Signed)
 Loretta Nelson [id number] Jun 13, 1965 59 y.o.  Virtual Visit via Video Note  I connected with pt @ on 03/04/2024 at 12:00 PM EST by a video enabled telemedicine application and verified that I am speaking with the correct person using two identifiers.   I discussed the limitations of evaluation and management by telemedicine and the availability of in person appointments. The patient expressed understanding and agreed to proceed.  I discussed the assessment and treatment plan with the patient. The patient was provided an opportunity to ask questions and all were answered. The patient agreed with the plan and demonstrated an understanding of the instructions.   The patient was advised to call back or seek an in-person evaluation if the symptoms worsen or if the condition fails to improve as anticipated.  I provided 25 minutes of non-face-to-face time during this encounter.  The patient was located at home.  The provider was located at [facility].   Loretta LOISE Sayers, NP   Subjective:   Patient ID:  Loretta Nelson is a 59 y.o. (DOB Apr 19, 1965) female.  Chief Complaint: No chief complaint on file.   HPI Loretta Nelson presents for follow-up of ADD, MDD, GAD, panic attacks, insomnia, mixed obsessional thoughts.  Describes mood today as it's about the same - totally dependent on my circumstances. Pleasant. Reports tearfulness at times. Mood symptoms - reports some depression. Reports improved interest and motivation. Reports decreased anxiety as long as I take the medications. Denies recent panic attacks. Denies irritability. Reports feeling overwhelmed - a little, but not as much. Reports feeling less distracted. Denies decreased intrusive thoughts. Reports decreased worry, rumination, and over thinking. Reports mood is variable - it's up and down. Stating I feel like I'm handling things better than I did previously. Feels like current medication regimen is helpful.  Taking medications as prescribed. Energy levels have improved - PCP working on thyroid  and BP meds. Active, does not have a regular exercise routine. Enjoys some usual interests and activities. Divorced. Mother living with her since 67 - serves as her care giver. Has 2 daughters at home. Appetite adequate. Weight stable - 255 pounds.    Sleeping well most nights. Averages 7 to 8 hours with Trazadone.   Reports focus and concentration has improved with the increase in Concerta . Managing some aspects of household. 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 Denies SI or HI.  Denies AH or VH. Denies self harm. Denies substance use.  Previous medication trials:  Celexa  Rexulti   Review of Systems:  Review of Systems  Musculoskeletal:  Negative for gait problem.  Neurological:  Negative for tremors.  Psychiatric/Behavioral:         Please refer to HPI    Medications: I have reviewed the patient's current medications.  Current Outpatient Medications  Medication Sig Dispense Refill   acetaminophen  (TYLENOL ) 500 MG tablet Take 2 tablets (1,000 mg total) by mouth every 8 (eight) hours as needed. 30 tablet 0   amLODipine  (NORVASC ) 5 MG tablet Take 5 mg by mouth daily.   5   buPROPion  (WELLBUTRIN  XL) 150 MG 24 hr tablet Take three tablets every morning. 270 tablet 1   Cholecalciferol 4000 UNITS CAPS Take 4,000 Units by mouth daily.      desvenlafaxine  (PRISTIQ ) 100 MG 24 hr tablet Take 1 tablet (100 mg total) by mouth daily. 90 tablet 1   diclofenac Sodium (VOLTAREN) 1 % GEL Apply 1  application topically 4 (four) times daily as needed (knee pain.).     EDARBYCLOR 40-25 MG TABS Take 1 tablet by mouth daily.  5   iron polysaccharides (NIFEREX) 150 MG capsule Take 150 mg by mouth daily.     loratadine  (CLARITIN ) 10 MG tablet TAKE 1 TABLET BY MOUTH EVERY DAY 90 tablet 1   methylphenidate  (CONCERTA ) 36 MG  PO CR tablet Take 1 tablet (36 mg total) by mouth every morning. 30 tablet 0   metoprolol  (TOPROL -XL) 200 MG 24 hr tablet Take 200 mg by mouth daily.   5   pantoprazole  (PROTONIX ) 40 MG tablet Take 40 mg by mouth 2 (two) times daily.      potassium chloride  (KLOR-CON ) 20 MEQ packet Take 40 mEq by mouth daily.  (Patient taking differently: Take 60 mEq by mouth daily.)     SYNTHROID  112 MCG tablet Take by mouth.     topiramate  (TOPAMAX ) 50 MG tablet Take 1 tablet (50 mg total) by mouth 2 (two) times daily. 60 tablet 3   traZODone  (DESYREL ) 50 MG tablet Take 0.5-1 tablets (25-50 mg total) by mouth at bedtime as needed. 90 tablet 1   No current facility-administered medications for this visit.    Medication Side Effects: None  Allergies: Allergies[1]  Past Medical History:  Diagnosis Date   Allergic rhinitis    BARD1 gene mutation positive 04/19/2019   Complication of anesthesia    woke up during EGD   Dysphagia    Empty sella syndrome    Essential hypertension    Family history of breast cancer    Fibroids, intramural 02/17/2017   Gastroesophageal reflux disease    Generalized anxiety disorder    Graves disease 01/13/11   radioactive iodine  treatment, 14.6 millicuries   Hiatal hernia    small   History of chemotherapy    History of hysterectomy 02/17/2017   History of migraine headaches    History of radiation therapy    Iron deficiency anemia    Malignant neoplasm of upper-outer quadrant of left breast in female, 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   Meralgia paresthetica    Murmur    benign, h/o, caused by hyperdynamic contraction   Obesity    Obstructive sleep apnea    Pneumonia 2017   inhaler prescribed   Postoperative hypothyroidism    Primary osteoarthritis of right foot 03/12/2018   Tendonitis of shoulder, right    Vitamin D deficiency     Family History  Problem Relation Age of Onset   Hypertension Mother    Alcoholism Father    CAD Maternal Grandfather     Breast cancer Paternal Grandmother    HIV Brother    Heart attack Brother        sudden MI vs PE   Breast cancer Sister 32   Breast cancer Other        one dx 3s, others dx in 41s   Alzheimer's disease Other    Colon polyps Neg Hx    Colon cancer Neg Hx    Liver disease Neg Hx     Social History   Socioeconomic History   Marital status: Divorced    Spouse name: Not on file   Number of children: Not on file   Years of education: 18   Highest education level: Master's degree (e.g., MA, MS, MEng, MEd, MSW, MBA)  Occupational History   Not on file  Tobacco Use   Smoking status: Never   Smokeless tobacco: Never  Vaping  Use   Vaping status: Never Used  Substance and Sexual Activity   Alcohol use: No   Drug use: No   Sexual activity: Not Currently    Birth control/protection: Abstinence  Other Topics Concern   Not on file  Social History Narrative   Not on file   Social Drivers of Health   Tobacco Use: Low Risk (02/05/2024)   Patient History    Smoking Tobacco Use: Never    Smokeless Tobacco Use: Never    Passive Exposure: Not on file  Financial Resource Strain: Not on file  Food Insecurity: Not on file  Transportation Needs: Not on file  Physical Activity: Not on file  Stress: Not on file  Social Connections: Unknown (07/09/2021)   Received from [facility]   Social Network    Social Network: Not on file  Intimate Partner Violence: Unknown (05/31/2021)   Received from [facility]   HITS    Physically Hurt: Not on file    Insult or Talk Down To: Not on file    Threaten Physical Harm: Not on file    Scream or Curse: Not on file  Depression (EYV7-0): Not on file  Alcohol Screen: Not on file  Housing: Not on file  Utilities: Not on file  Health Literacy: Not on file    Past Medical History, Surgical history, Social history, and Family history were reviewed and updated as appropriate.   Please see review of systems for further details on the patient's  review from today.   Objective:   Physical Exam:  LMP 02/13/2017   Physical Exam Constitutional:      General: She is not in acute distress. Musculoskeletal:        General: No deformity.  Neurological:     Mental Status: She is alert and oriented to person, place, and time.     Coordination: Coordination normal.  Psychiatric:        Attention and Perception: Attention and perception normal. She does not perceive auditory or visual hallucinations.        Mood and Affect: Mood normal. Mood is not anxious or depressed. Affect is not labile, blunt, angry or inappropriate.        Speech: Speech normal.        Behavior: Behavior normal.        Thought Content: Thought content normal. Thought content is not paranoid or delusional. Thought content does not include homicidal or suicidal ideation. Thought content does not include homicidal or suicidal plan.        Cognition and Memory: Cognition and memory normal.        Judgment: Judgment normal.     Comments: Insight intact     Lab Review:     Component Value Date/Time   NA 142 05/17/2021 1551   K 3.7 05/17/2021 1551   CL 103 05/17/2021 1551   CO2 33 (H) 05/17/2021 1551   GLUCOSE 97 05/17/2021 1551   BUN 14 05/17/2021 1551   CREATININE 0.84 05/17/2021 1551   CALCIUM 9.6 05/17/2021 1551   PROT 7.2 05/17/2021 1551   ALBUMIN 4.0 05/17/2021 1551   AST 18 05/17/2021 1551   ALT 16 05/17/2021 1551   ALKPHOS 75 05/17/2021 1551   BILITOT 0.3 05/17/2021 1551   GFRNONAA >60 05/17/2021 1551   GFRAA >60 10/24/2019 1323   GFRAA >60 04/10/2019 1240       Component Value Date/Time   WBC 6.2 05/17/2021 1551   WBC 5.3 11/23/2020 1140  RBC 4.28 05/17/2021 1551   HGB 11.5 (L) 05/17/2021 1551   HCT 36.1 05/17/2021 1551   PLT 187 05/17/2021 1551   MCV 84.3 05/17/2021 1551   MCH 26.9 05/17/2021 1551   MCHC 31.9 05/17/2021 1551   RDW 13.2 05/17/2021 1551   LYMPHSABS 1.8 05/17/2021 1551   MONOABS 0.5 05/17/2021 1551   EOSABS 0.3  05/17/2021 1551   BASOSABS 0.0 05/17/2021 1551    No results found for: POCLITH, LITHIUM   No results found for: PHENYTOIN, PHENOBARB, VALPROATE, CBMZ   .res Assessment: Plan:    Plan:  PDMP reviewed  Wellbutrin  XL 150mg  - 3 daily - denies seizure history. Pristiq  100mg  daily Trazadone 50mg  at hs  Concerta  36mg  every morning for ADD symptoms. Patient has completed several ADD screening assessments indicating an ADD diagnosis.   Reports BP regularly - following up with PCP.  Therapist - Marval Bunde - seeing as needed  RTC 3 months  25 minutes spent dedicated to the care of this patient on the date of this encounter to include pre-visit review of records, ordering of medication, post visit documentation, and face-to-face time with the patient discussing ADD, MDD, GAD, panic attacks, insomnia, mixed obsessional thoughts. Discussed continuing current medication regimen.  Patient advised to contact office with any questions, adverse effects, or acute worsening in signs and symptoms.   There are no diagnoses linked to this encounter.   Please see After Visit Summary for patient specific instructions.  Future Appointments  Date Time Provider Department Center  03/04/2024 12:00 PM Dru Laurel, Loretta Mattocks, NP [facility] None  03/13/2024  3:30 PM [facility] None  03/14/2024  3:00 PM Bunde Sober, LCSW [facility] None  06/25/2024 11:15 AM Odean Potts, MD [facility] None    No orders of the defined types were placed in this encounter.     -------------------------------      [1]  Allergies Allergen Reactions   Other     Other reaction(s): rash/red   Tape Itching and Other (See Comments)    Adhesive tape--redness/inflammation/itching

## 2024-03-11 ENCOUNTER — Other Ambulatory Visit: Payer: Self-pay | Admitting: *Deleted

## 2024-03-11 MED ORDER — TOPIRAMATE 50 MG PO TABS: 50.0000 mg | ORAL_TABLET | Freq: Two times a day (BID) | ORAL | 2 refills | Status: AC

## 2024-03-11 NOTE — Telephone Encounter (Signed)
 Last seen on 01/16/24 No follow up scheduled

## 2024-03-13 ENCOUNTER — Ambulatory Visit: Admitting: Neurology

## 2024-03-13 DIAGNOSIS — G4734 Idiopathic sleep related nonobstructive alveolar hypoventilation: Secondary | ICD-10-CM

## 2024-03-13 DIAGNOSIS — G4733 Obstructive sleep apnea (adult) (pediatric): Secondary | ICD-10-CM | POA: Diagnosis not present

## 2024-03-14 ENCOUNTER — Ambulatory Visit: Admitting: Psychiatry

## 2024-03-14 DIAGNOSIS — F331 Major depressive disorder, recurrent, moderate: Secondary | ICD-10-CM | POA: Diagnosis not present

## 2024-03-14 NOTE — Progress Notes (Signed)
 "       [facility] Counselor/Therapist Progress Note  Patient ID: Loretta Nelson, MRN: [id number],    Date: 03/14/2024  Time Spent: 53 minutes   Treatment Type: Individual Therapy  Reported Symptoms:  depression, anxiety   Mental Status Exam:  Appearance:   Casual     Behavior:  Appropriate, Sharing, and Motivated  Motor:  Normal  Speech/Language:   Clear and Coherent  Affect:  Depressed and anxious  Mood:  anxious and depressed  Thought process:  goal directed  Thought content:    Rumination  Sensory/Perceptual disturbances:    WNL  Orientation:  oriented to person, place, time/date, situation, day of week, month of year, year, and stated date of Jan. 15, 2026  Attention:  Good  Concentration:  Good  Memory:  WNL  Fund of knowledge:   Good  Insight:    Good and Fair  Judgment:   Good  Impulse Control:  Good   Risk Assessment: Danger to Self:  No Self-injurious Behavior: No Danger to Others: No Duty to Warn:no Physical Aggression / Violence:No  Access to Firearms a concern: No  Gang Involvement:No   Subjective:  Patient today reporting continued anxiety and depression. Has not heard further info on jobs applied for within s-system. Discouraged that she hasn't heard back from job applications, and has applied for another job at [employer]. Working on some issues within person at her church that has been challenging and trying to be very careful in my interactions with that person, trying to set boundaries in a helpful way. Talking through her stressors, discouragement, but also her assets in her job search. Trying to better manage her stress and uncertainty in job search. Feeling heard and encouraged, and trying to not be making negative assumptions about herself and her future.   Interventions: Cognitive Behavioral Therapy, Solution-Oriented/Positive Psychology, and Ego-Supportive Reduce overall level, frequency, and intensity of the anxiety and depression so that daily  functioning is not impaired. Verbalize an understanding of the role that fearful thinking plays in creating fears, excessive worry, and persistent anxiety symptoms.  3.   Strengthen her new non-avoiding approach and build confidence.   Diagnosis:   ICD-10-CM   1. Major depressive disorder, recurrent episode, moderate (HCC)  F33.1      Plan:  Patient today showing more motivation towards her goals and especially in seeking employment and following up on her applications. Reports things at home are going rather well with mom living there.  No tearfulness in session today and is more focused on getting a job and moving forward.  Able to recognize her positives and her strengths.  Good support within her family and friends.  Did encourage patient further in practicing the use of more self-acceptance, use of appropriate boundaries and understand that she does not have to explain to everybody when they ask questions, better management of anxiety and depression through use of learned strategies discussed in sessions, continue working on decreased perfectionistic tendencies, stay in the present and focused on what she can control or change versus cannot, maintain contact with people who are supportive of her, get outside daily and walk, avoid assuming worst-case scenarios, look more for the positives versus negatives each day, healthy nutrition and exercise, use of daily affirmations, positive self-talk, reduce overthinking and overanalyzing, be involved in more things that she enjoys that are healthy for her, saying no when she needs to say no, allow her faith to be an emotional support as well as spiritual,  and realize the strength she shows when working with goal-directed behaviors that can help her to move in the direction of improved overall emotional health and wellbeing.  Goal review and progress/challenges noted with patient.  Next appointment within 3 weeks.   Barnie Bunde,  LCSW                   "

## 2024-03-14 NOTE — Progress Notes (Unsigned)
 See procedure note.

## 2024-03-15 ENCOUNTER — Ambulatory Visit: Payer: Self-pay | Admitting: Family Medicine

## 2024-03-15 DIAGNOSIS — G4733 Obstructive sleep apnea (adult) (pediatric): Secondary | ICD-10-CM

## 2024-03-15 NOTE — Procedures (Signed)
 "   [facility]  HOME SLEEP TEST (Watch PAT) REPORT  STUDY DATE: 03/14/24  DOB: [birth year]  MRN: [id number]  ORDERING CLINICIAN: True Mar, MD, PhD   REFERRING CLINICIAN: Greig Forbes, NP  CLINICAL INFORMATION/HISTORY (obtained from visit note dated 01/16/24): 59 yo female with an underlying medical history of hypertension, vitamin D deficiency, Graves' disease with status post radioactive iodine , reflux disease, arthritis, anxiety, anemia, allergic rhinitis, hypothyroidism,  OSA on CPAP, obesity and meralgia paresthetica, who presents for re-evaluation of her OSA. She has lost a significant amount of weight over time. She continues to be compliant with her CPAP of 12 cm with EPR of 3 with good apnea control and good tolerance of treatment. She should be eligible for a new machine.   BMI: 42.8 kg/m  FINDINGS:   Sleep Summary:   Total Recording Time (hours, min): 7 hours, 27 min  Total Sleep Time (hours, min):  6 hours, 48 min  Percent REM (%):    26.2%   Respiratory Indices:   Calculated pAHI (per hour):  17.9/hour         REM pAHI:    35.3/hour       NREM pAHI: 11.7/hour  Central pAHI: 0.5/hour  Oxygen Saturation Statistics:    Oxygen Saturation (%) Mean: 91%   Minimum oxygen saturation (%):                 74%   O2 Saturation Range (%): 74-96%    O2 Saturation (minutes) <=88%: 19.9 min  Pulse Rate Statistics:   Pulse Mean (bpm):    56/min    Pulse Range (50-71/min)   IMPRESSION:  OSA (obstructive sleep apnea), moderate Nocturnal Hypoxemia  RECOMMENDATION:  This home sleep test demonstrates moderate obstructive sleep apnea with a total AHI of 17.9/hour and O2 nadir of 74% with time below or at 88% saturation of nearly 20 minutes for the night, indicating some degree of nocturnal hypoxemia.  Snoring was in the moderate to loud range fairly consistently throughout the night.  Ongoing treatment with positive a airway pressure device is  recommended.  The patient has been compliant with her CPAP of 12 cm with EPR of 3 with good apnea control and good tolerance of treatment.  She should be eligible for a new machine; I would keep the settings on her new equipment the same, mask of choice, sized to fit.  Ongoing full compliance should be reinforced.  Ongoing weight management should be encouraged.  A full night titration study may be considered to optimize treatment settings, monitor proper oxygen saturations and aid with improvement of tolerance and adherence, if needed down the road.  Alternative treatment options may include a dental device through dentistry or orthodontics in selected patients or Inspire (hypoglossal nerve stimulator) in carefully selected patients (meeting inclusion criteria).  Again,  concomitant weight loss is recommended (where clinically appropriate). Please note that untreated obstructive sleep apnea may carry additional perioperative morbidity. Patients with significant obstructive sleep apnea should receive perioperative PAP therapy and the surgeons and particularly the anesthesiologist should be informed of the diagnosis and the severity of the sleep disordered breathing. The patient should be cautioned not to drive, work at heights, or operate dangerous or heavy equipment when tired or sleepy. Review and reiteration of good sleep hygiene measures should be pursued with any patient. Other causes of the patient's symptoms, including circadian rhythm disturbances, an underlying mood disorder, medication effect and/or an underlying medical problem cannot be ruled out based  on this test. Clinical correlation is recommended.  The patient and her referring provider will be notified of the test results. The patient will be seen in follow up in sleep clinic at [facility].  I certify that I have reviewed the raw data recording prior to the issuance of this report in accordance with the standards of the American Academy of Sleep  Medicine (AASM).    INTERPRETING PHYSICIAN:   True Mar, MD, PhD Medical Director, [facility]) Diplomat, ABPN (Neurology and Sleep)   [facility] [address] [place], [place] [address] [phone]                     "

## 2024-03-25 ENCOUNTER — Ambulatory Visit: Admitting: Psychiatry

## 2024-03-25 DIAGNOSIS — F331 Major depressive disorder, recurrent, moderate: Secondary | ICD-10-CM | POA: Diagnosis not present

## 2024-03-25 NOTE — Progress Notes (Addendum)
 "       [facility] Counselor/Therapist Progress Note  Patient ID: Loretta Nelson, MRN: [id number],    Date: 03/25/2024  Time Spent: 53 minutes   Treatment Type: Individual Therapy  Virtual Visit via Telehealth Note: MyChartVideo session Connected with patient by a telemedicine/telehealth application, with their informed consent, and verified patient privacy and that I am speaking with the correct person using two identifiers. I discussed the limitations, risks, security and privacy concerns of performing psychotherapy and the availability of in person appointments. I also discussed with the patient that there may be a patient responsible charge related to this service. The patient expressed understanding and agreed to proceed. I discussed the treatment planning with the patient. The patient was provided an opportunity to ask questions and all were answered. The patient agreed with the plan and demonstrated an understanding of the instructions. The patient was advised to call  our office if  symptoms worsen or feel they are in a crisis state and need immediate contact.   Therapist Location: office Patient Location: home   Reported Symptoms: depression, anxiety   Mental Status Exam:  Appearance:   Casual     Behavior:  Appropriate, Sharing, and Motivated  Motor:  Normal  Speech/Language:   Clear and Coherent  Affect:  Depressed and anxious  Mood:  anxious and depressed  Thought process:  goal directed  Thought content:    Rumination  Sensory/Perceptual disturbances:    WNL  Orientation:  oriented to person, place, time/date, situation, day of week, month of year, year, and stated date of Jan. 26, 2026  Attention:  Fair  Concentration:  Fair  Memory:  WNL  Fund of knowledge:   Good  Insight:    Good and Fair  Judgment:   Good  Impulse Control:  Good   Risk Assessment: Danger to Self:  No Self-injurious Behavior: No Danger to Others: No Duty to Warn:no Physical Aggression /  Violence:No  Access to Firearms a concern: No  Gang Involvement:No    Subjective:  Patient today reporting depression and anxiety with no SI. Did have sleep test done recently and will be keeping my machine since I still have problems but does feel it is helping my sleep. Seeking new job and that is still a work in progress. Picked up on a discrepancy in her job search that could have impacted her getting employed (not all details included in this note due to patient privacy needs.) Still waiting to hear from other applications for jobs. Trying to better manage her frustration, anxiety, depression, and indecision, and shared several stressful situations which we talked through today and looking at those situations in different ways that could help her move forward more in a positive direction. No feedback on job applications and trying to be patient. Working to get out of bed on more regular times and get out of the house more often, and trying to be more intentional about getting out. Concerned  that her age may be a factor in her getting hired, and talking through these concerns. States she is trying to hold onto her hope that a job opportunity will open up soon. To practice good self-care and remain interactive with others that are supportive of her.    Interventions: Cognitive Behavioral Therapy, Solution-Oriented/Positive Psychology, and Ego-Supportive Reduce overall level, frequency, and intensity of the anxiety and depression so that daily functioning is not impaired. 2.   Verbalize an understanding of the role that fearful thinking plays in  creating fears, excessive worry, and persistent anxiety symptoms. 3.   Strengthen her new non-avoiding approach and build confidence.    Diagnosis:   ICD-10-CM   1. Major depressive disorder, recurrent episode, moderate (HCC)  F33.1       Plan:  Patient today concerned about her jobs search and remains motivated on job search and her goals as stated  above.  Recognizing positives and strengths and also her stressors, indecision, and frustrations. Focusing on what helps versus what hurts.  Encouraged to stay in touch with supportive people and not slip back into assuming worst case scenarios for her situation. Is able to recognize some of her positives and accentuated that with patient.   Goal review and progress/challenges noted with patient.  Next appt within 3 weeks.   Barnie Bunde, LCSW                   "

## 2024-03-28 NOTE — Telephone Encounter (Signed)
 [id number]

## 2024-04-08 ENCOUNTER — Ambulatory Visit: Admitting: Psychiatry

## 2024-04-23 ENCOUNTER — Ambulatory Visit: Admitting: Psychiatry

## 2024-06-25 ENCOUNTER — Ambulatory Visit: Admitting: Hematology and Oncology
